# Patient Record
Sex: Male | Born: 1945 | Race: Black or African American | Hispanic: No | Smoking: Former smoker
Health system: Southern US, Community
[De-identification: ages and names within clinical notes are randomized; demographics above are authoritative.]

## PROBLEM LIST (undated history)

## (undated) DIAGNOSIS — I959 Hypotension, unspecified: Secondary | ICD-10-CM

## (undated) DIAGNOSIS — R569 Unspecified convulsions: Secondary | ICD-10-CM

## (undated) DIAGNOSIS — D509 Iron deficiency anemia, unspecified: Secondary | ICD-10-CM

## (undated) DIAGNOSIS — E538 Deficiency of other specified B group vitamins: Secondary | ICD-10-CM

## (undated) DIAGNOSIS — G7080 Lambert-Eaton syndrome, unspecified: Secondary | ICD-10-CM

## (undated) DIAGNOSIS — M51369 Other intervertebral disc degeneration, lumbar region without mention of lumbar back pain or lower extremity pain: Secondary | ICD-10-CM

## (undated) DIAGNOSIS — K219 Gastro-esophageal reflux disease without esophagitis: Secondary | ICD-10-CM

## (undated) DIAGNOSIS — J449 Chronic obstructive pulmonary disease, unspecified: Secondary | ICD-10-CM

## (undated) DIAGNOSIS — M5136 Other intervertebral disc degeneration, lumbar region: Secondary | ICD-10-CM

## (undated) DIAGNOSIS — IMO0001 Reserved for inherently not codable concepts without codable children: Secondary | ICD-10-CM

## (undated) DIAGNOSIS — G8929 Other chronic pain: Secondary | ICD-10-CM

## (undated) DIAGNOSIS — M549 Dorsalgia, unspecified: Secondary | ICD-10-CM

## (undated) HISTORY — PX: OTHER SURGICAL HISTORY: SHX169

## (undated) HISTORY — DX: Iron deficiency anemia, unspecified: D50.9

## (undated) HISTORY — DX: Gastro-esophageal reflux disease without esophagitis: K21.9

## (undated) HISTORY — DX: Hypotension, unspecified: I95.9

## (undated) HISTORY — DX: Lambert-Eaton syndrome, unspecified: G70.80

## (undated) HISTORY — DX: Deficiency of other specified B group vitamins: E53.8

---

## 2001-04-22 ENCOUNTER — Encounter: Payer: Self-pay | Admitting: *Deleted

## 2001-04-22 ENCOUNTER — Emergency Department (HOSPITAL_COMMUNITY): Admission: EM | Admit: 2001-04-22 | Discharge: 2001-04-22 | Payer: Self-pay | Admitting: *Deleted

## 2013-05-11 ENCOUNTER — Encounter (HOSPITAL_COMMUNITY): Payer: Self-pay

## 2013-05-11 ENCOUNTER — Emergency Department (HOSPITAL_COMMUNITY): Payer: Self-pay

## 2013-05-11 ENCOUNTER — Emergency Department (HOSPITAL_COMMUNITY)
Admission: EM | Admit: 2013-05-11 | Discharge: 2013-05-11 | Disposition: A | Discharge status: Home / Self Care | Payer: Self-pay | Attending: Emergency Medicine | Admitting: Emergency Medicine

## 2013-05-11 DIAGNOSIS — M549 Dorsalgia, unspecified: Secondary | ICD-10-CM

## 2013-05-11 DIAGNOSIS — M545 Low back pain, unspecified: Secondary | ICD-10-CM | POA: Insufficient documentation

## 2013-05-11 DIAGNOSIS — Y929 Unspecified place or not applicable: Secondary | ICD-10-CM | POA: Insufficient documentation

## 2013-05-11 DIAGNOSIS — R110 Nausea: Secondary | ICD-10-CM | POA: Insufficient documentation

## 2013-05-11 DIAGNOSIS — IMO0002 Reserved for concepts with insufficient information to code with codable children: Secondary | ICD-10-CM | POA: Insufficient documentation

## 2013-05-11 DIAGNOSIS — R0602 Shortness of breath: Secondary | ICD-10-CM | POA: Insufficient documentation

## 2013-05-11 DIAGNOSIS — T18108A Unspecified foreign body in esophagus causing other injury, initial encounter: Secondary | ICD-10-CM | POA: Insufficient documentation

## 2013-05-11 DIAGNOSIS — Y9389 Activity, other specified: Secondary | ICD-10-CM | POA: Insufficient documentation

## 2013-05-11 DIAGNOSIS — R079 Chest pain, unspecified: Secondary | ICD-10-CM | POA: Insufficient documentation

## 2013-05-11 DIAGNOSIS — Z8669 Personal history of other diseases of the nervous system and sense organs: Secondary | ICD-10-CM | POA: Insufficient documentation

## 2013-05-11 HISTORY — DX: Unspecified convulsions: R56.9

## 2013-05-11 LAB — CBC WITH DIFFERENTIAL/PLATELET
Basophils Absolute: 0.1 10*3/uL (ref 0.0–0.1)
Basophils Relative: 1 % (ref 0–1)
Eosinophils Absolute: 0.2 10*3/uL (ref 0.0–0.7)
Eosinophils Relative: 2 % (ref 0–5)
HCT: 37.6 % — ABNORMAL LOW (ref 39.0–52.0)
Hemoglobin: 12.4 g/dL — ABNORMAL LOW (ref 13.0–17.0)
Lymphocytes Relative: 23 % (ref 12–46)
Lymphs Abs: 1.8 10*3/uL (ref 0.7–4.0)
MCH: 23 pg — ABNORMAL LOW (ref 26.0–34.0)
MCHC: 33 g/dL (ref 30.0–36.0)
MCV: 69.8 fL — ABNORMAL LOW (ref 78.0–100.0)
Monocytes Absolute: 0.5 10*3/uL (ref 0.1–1.0)
Monocytes Relative: 6 % (ref 3–12)
Neutro Abs: 5.2 10*3/uL (ref 1.7–7.7)
Neutrophils Relative %: 68 % (ref 43–77)
Platelets: 219 10*3/uL (ref 150–400)
RBC: 5.39 MIL/uL (ref 4.22–5.81)
RDW: 14.9 % (ref 11.5–15.5)
WBC: 7.8 10*3/uL (ref 4.0–10.5)

## 2013-05-11 LAB — COMPREHENSIVE METABOLIC PANEL
ALT: 9 U/L (ref 0–53)
AST: 18 U/L (ref 0–37)
Albumin: 3.6 g/dL (ref 3.5–5.2)
Alkaline Phosphatase: 63 U/L (ref 39–117)
BUN: 11 mg/dL (ref 6–23)
CO2: 30 mEq/L (ref 19–32)
Calcium: 9.9 mg/dL (ref 8.4–10.5)
Chloride: 102 mEq/L (ref 96–112)
Creatinine, Ser: 1.23 mg/dL (ref 0.50–1.35)
GFR calc Af Amer: 68 mL/min — ABNORMAL LOW (ref >90)
GFR calc non Af Amer: 59 mL/min — ABNORMAL LOW (ref >90)
Glucose, Bld: 97 mg/dL (ref 70–99)
Potassium: 5 mEq/L (ref 3.5–5.1)
Sodium: 139 mEq/L (ref 135–145)
Total Bilirubin: 0.7 mg/dL (ref 0.3–1.2)
Total Protein: 7.5 g/dL (ref 6.0–8.3)

## 2013-05-11 LAB — LIPASE, BLOOD: Lipase: 21 U/L (ref 11–59)

## 2013-05-11 MED ORDER — TRAMADOL HCL 50 MG PO TABS: 50.0000 mg | ORAL_TABLET | Freq: Four times a day (QID) | ORAL | Status: DC | PRN

## 2013-05-11 NOTE — ED Notes (Signed)
Pt states he ate a salad Sunday. States he has been having difficulty swallowing since. States he has been able to drink water and keep it down but has not eaten any solid food because he is afraid to. Also, complain of low back pain.

## 2013-05-11 NOTE — Progress Notes (Signed)
ED/CM noted patient did not have health insurance and/or PCP listed in the computer.  Patient was given the [place] resource handout with information on the clinics, food pantries, and the handout for new health insurance sign-up.  Patient expressed appreciation for this. 

## 2013-05-11 NOTE — ED Provider Notes (Signed)
CSN: [id number]     Arrival date & time 05/11/13  1323 History   This chart was scribed for Erik Jakes, MD, by Erik [name], ED Scribe. This patient was seen in room APA02/APA02 and the patient's care was started at 2:19 PM.  First MD Initiated Contact with Patient 05/11/13 1411     Chief Complaint  Patient presents with  . Dysphagia    The history is provided by the patient and a relative. No language interpreter was used.   HPI Comments: Erik [name] is a 67 y.o. male, with a h/o seizures, who presents to the Emergency Department complaining of persistent dysphagia which has been occurring for five days. The symptoms began after he ate sausage cakes in the morning followed by a salad in the afternoon; the discomfort did not prohibit him from finishing the sausage cakes or salad. The dysphagia is increased with deep inspiration. It was increased with the swallowing of water. However, he treated the dysphagia with Alka-Selzer and that helped him drink water without pain. He reports that he has eaten less since Sunday; the pt reports no difficulty swallowing the water or crackers. He denies any h/o dysphagia prior to this occurrence. He denies any emesis. He also complains of lower back pain which radiates to the back of his legs bilaterally and intermittent SOB which is increased with exertion. The pt visited his PCP earlier this week who advised him to come to the ED. The pt is a non-smoker.   His PCP with with [facility] Department.  Past Medical History  Diagnosis Date  . Seizures    History reviewed. No pertinent past surgical history. No family history on file. History  Substance Use Topics  . Smoking status: Never Smoker   . Smokeless tobacco: Not on file  . Alcohol Use: No    Review of Systems  Constitutional: Negative for fever, chills and appetite change.  HENT: Negative for congestion, rhinorrhea, sneezing, trouble swallowing and neck pain.   Eyes:  Negative for visual disturbance.  Respiratory: Positive for shortness of breath. Negative for cough.   Cardiovascular: Positive for chest pain.  Gastrointestinal: Positive for nausea. Negative for vomiting, abdominal pain and diarrhea.  Genitourinary: Negative for dysuria.  Musculoskeletal: Positive for back pain (Baseline pain to lower, center of the back).  Skin: Negative for rash.  Neurological: Negative for numbness and headaches.  Hematological: Does not bruise/bleed easily.    Allergies  Review of patient's allergies indicates no known allergies.  Home Medications   Current Outpatient Rx  Name  Route  Sig  Dispense  Refill  . Aspirin Effervescent (ALKA-SELTZER PO)   Oral   Take 1 tablet by mouth daily as needed.         . traMADol (ULTRAM) 50 MG tablet   Oral   Take 1 tablet (50 mg total) by mouth every 6 (six) hours as needed.   20 tablet   0    Triage Vitals: BP 155/93  Pulse 78  Temp(Src) 98.8 F (37.1 C) (Oral)  Resp 20  Ht 6\' 3"  (1.905 m)  Wt 190 lb (86.183 kg)  BMI 23.75 kg/m2  SpO2 97%  Physical Exam  Nursing note and vitals reviewed. Constitutional: He is oriented to person, place, and time. He appears well-developed and well-nourished. No distress.  HENT:  Head: Normocephalic and atraumatic.  Mouth/Throat: Oropharynx is clear and moist.  No erythema to pharynx. No swelling to pharynx.   Eyes: EOM are normal.  Neck: Neck supple. No tracheal deviation present.  Cardiovascular: Normal rate, regular rhythm, normal heart sounds and intact distal pulses.   No murmur heard. 2 + right DP  2 + left DP Cap refill less than 1 second.   Pulmonary/Chest: Effort normal and breath sounds normal. No respiratory distress. He has no wheezes.  Abdominal: Soft. Bowel sounds are normal. There is no tenderness.  Musculoskeletal: Normal range of motion. He exhibits tenderness. He exhibits no edema.  Tenderness to right lumbar spinal process area. No lumbar  peri-process tenderness.   Neurological: He is alert and oriented to person, place, and time.  Skin: Skin is warm and dry.  Psychiatric: He has a normal mood and affect. His behavior is normal.    ED Course  Procedures (including critical care time)  DIAGNOSTIC STUDIES: Oxygen Saturation is 97% on room air, normal by my interpretation.    COORDINATION OF CARE:  2:51 PM- Discussed treatment plan with patient, and the patient agreed to the plan.   Labs Review Labs Reviewed  COMPREHENSIVE METABOLIC PANEL - Abnormal; Notable for the following:    GFR calc non Af Amer 59 (*)    GFR calc Af Amer 68 (*)    All other components within normal limits  CBC WITH DIFFERENTIAL - Abnormal; Notable for the following:    Hemoglobin 12.4 (*)    HCT 37.6 (*)    MCV 69.8 (*)    MCH 23.0 (*)    All other components within normal limits  LIPASE, BLOOD   Imaging Review Dg Chest 2 View  05/11/2013   CLINICAL DATA:  Low back pain and difficulty swallowing. No injury. Dysphagia.  EXAM: CHEST  2 VIEW  COMPARISON:  None.  FINDINGS: The heart size and mediastinal contours are within normal limits. The lungs are hyperexpanded but clear. The visualized skeletal structures are unremarkable.  IMPRESSION: No active cardiopulmonary disease.   Electronically Signed   By: Amie Portland   On: 05/11/2013 16:02   Dg Lumbar Spine Complete  05/11/2013   CLINICAL DATA:  Low back pain. No injury.  EXAM: LUMBAR SPINE - COMPLETE 4+ VIEW  COMPARISON:  None.  FINDINGS: No fracture. There is a minor, grade 1, anterolisthesis of L4 on L5. No other spondylolisthesis. Mild loss of disk height is noted at L3-L4 with moderate loss of disk height at L4-L5. Remaining disk spaces are well preserved. There is facet joint narrowing on the left from L3-L4 through L5-S1 and on the right at L4-L5 and L5-S1.  There is a calcification in the left upper quadrant, which is likely vascular. It could reside within the stomach. The soft tissues are  otherwise unremarkable.  IMPRESSION: Degenerative changes as described.   Electronically Signed   By: Amie Portland   On: 05/11/2013 16:04   Results for orders placed during the hospital encounter of 05/11/13  COMPREHENSIVE METABOLIC PANEL      Result Value Range   Sodium 139  135 - 145 mEq/L   Potassium 5.0  3.5 - 5.1 mEq/L   Chloride 102  96 - 112 mEq/L   CO2 30  19 - 32 mEq/L   Glucose, Bld 97  70 - 99 mg/dL   BUN 11  6 - 23 mg/dL   Creatinine, Ser 1.61  0.50 - 1.35 mg/dL   Calcium 9.9  8.4 - 09.6 mg/dL   Total Protein 7.5  6.0 - 8.3 g/dL   Albumin 3.6  3.5 - 5.2 g/dL   AST  18  0 - 37 U/L   ALT 9  0 - 53 U/L   Alkaline Phosphatase 63  39 - 117 U/L   Total Bilirubin 0.7  0.3 - 1.2 mg/dL   GFR calc non Af Amer 59 (*) >90 mL/min   GFR calc Af Amer 68 (*) >90 mL/min  LIPASE, BLOOD      Result Value Range   Lipase 21  11 - 59 U/L  CBC WITH DIFFERENTIAL      Result Value Range   WBC 7.8  4.0 - 10.5 K/uL   RBC 5.39  4.22 - 5.81 MIL/uL   Hemoglobin 12.4 (*) 13.0 - 17.0 g/dL   HCT 16.1 (*) 09.6 - 04.5 %   MCV 69.8 (*) 78.0 - 100.0 fL   MCH 23.0 (*) 26.0 - 34.0 pg   MCHC 33.0  30.0 - 36.0 g/dL   RDW 40.9  81.1 - 91.4 %   Platelets 219  150 - 400 K/uL   Neutrophils Relative % 68  43 - 77 %   Lymphocytes Relative 23  12 - 46 %   Monocytes Relative 6  3 - 12 %   Eosinophils Relative 2  0 - 5 %   Basophils Relative 1  0 - 1 %   Neutro Abs 5.2  1.7 - 7.7 K/uL   Lymphs Abs 1.8  0.7 - 4.0 K/uL   Monocytes Absolute 0.5  0.1 - 1.0 K/uL   Eosinophils Absolute 0.2  0.0 - 0.7 K/uL   Basophils Absolute 0.1  0.0 - 0.1 K/uL   WBC Morphology ATYPICAL LYMPHOCYTES     Smear Review LARGE PLATELETS PRESENT      Date: 05/11/2013  Rate: 73  Rhythm: normal sinus rhythm  QRS Axis: normal  Intervals: normal  ST/T Wave abnormalities: normal  Conduction Disutrbances:none  Narrative Interpretation:   Old EKG Reviewed: none available    MDM   1. Back pain   2. Esophageal foreign body,  initial encounter    Patient with foreign body sensation in the esophagus, he has been able to drink water fine and ate some crackers he was concerned and afraid to eat solid food no evidence of esophageal impaction here patient is able to be solid food without any problems. Patient's lumbar back has a lot of degenerative changes we'll refer him to the spine clinic for further followup and workup of that. That would explain his back pain. Patient currently has no neurological deficits. Patient's chest x-ray is negative for pneumonia pulmonary edema pneumothorax or pneumomediastinum.  EKG without any evidence of acute cardiac event. Patient's symptoms really not consistent with cardiac chest pain.   I personally performed the services described in this documentation, which was scribed in my presence. The recorded information has been reviewed and is accurate.      Erik Jakes, MD 05/11/13 [phone]

## 2014-06-25 ENCOUNTER — Encounter (HOSPITAL_COMMUNITY): Payer: Self-pay | Admitting: *Deleted

## 2014-06-25 ENCOUNTER — Emergency Department (HOSPITAL_COMMUNITY)
Admission: EM | Admit: 2014-06-25 | Discharge: 2014-06-25 | Disposition: A | Discharge status: Home / Self Care | Payer: Medicare Other | Attending: Emergency Medicine | Admitting: Emergency Medicine

## 2014-06-25 ENCOUNTER — Emergency Department (HOSPITAL_COMMUNITY): Payer: Medicare Other

## 2014-06-25 DIAGNOSIS — R0602 Shortness of breath: Secondary | ICD-10-CM | POA: Diagnosis not present

## 2014-06-25 DIAGNOSIS — M5440 Lumbago with sciatica, unspecified side: Secondary | ICD-10-CM | POA: Insufficient documentation

## 2014-06-25 DIAGNOSIS — M549 Dorsalgia, unspecified: Secondary | ICD-10-CM | POA: Diagnosis not present

## 2014-06-25 DIAGNOSIS — R5383 Other fatigue: Secondary | ICD-10-CM | POA: Insufficient documentation

## 2014-06-25 DIAGNOSIS — Z7982 Long term (current) use of aspirin: Secondary | ICD-10-CM | POA: Insufficient documentation

## 2014-06-25 LAB — PRO B NATRIURETIC PEPTIDE: PRO B NATRI PEPTIDE: 27.9 pg/mL (ref 0–125)

## 2014-06-25 LAB — BASIC METABOLIC PANEL
ANION GAP: 16 — AB (ref 5–15)
BUN: 18 mg/dL (ref 6–23)
CHLORIDE: 101 meq/L (ref 96–112)
CO2: 24 meq/L (ref 19–32)
Calcium: 9.6 mg/dL (ref 8.4–10.5)
Creatinine, Ser: 1.41 mg/dL — ABNORMAL HIGH (ref 0.50–1.35)
GFR calc non Af Amer: 49 mL/min — ABNORMAL LOW (ref >90)
GFR, EST AFRICAN AMERICAN: 57 mL/min — AB (ref >90)
Glucose, Bld: 88 mg/dL (ref 70–99)
POTASSIUM: 4.4 meq/L (ref 3.7–5.3)
SODIUM: 141 meq/L (ref 137–147)

## 2014-06-25 LAB — CBC
HCT: 38.8 % — ABNORMAL LOW (ref 39.0–52.0)
Hemoglobin: 12.7 g/dL — ABNORMAL LOW (ref 13.0–17.0)
MCH: 22.4 pg — ABNORMAL LOW (ref 26.0–34.0)
MCHC: 32.7 g/dL (ref 30.0–36.0)
MCV: 68.3 fL — ABNORMAL LOW (ref 78.0–100.0)
PLATELETS: 261 10*3/uL (ref 150–400)
RBC: 5.68 MIL/uL (ref 4.22–5.81)
RDW: 15.1 % (ref 11.5–15.5)
WBC: 8.8 10*3/uL (ref 4.0–10.5)

## 2014-06-25 LAB — I-STAT TROPONIN, ED
TROPONIN I, POC: 0 ng/mL (ref 0.00–0.08)
Troponin i, poc: 0 ng/mL (ref 0.00–0.08)

## 2014-06-25 MED ORDER — SODIUM CHLORIDE 0.9 % IV BOLUS (SEPSIS)
1000.0000 mL | Freq: Once | INTRAVENOUS | Status: AC
  Administered 2014-06-25: 1000 mL via INTRAVENOUS

## 2014-06-25 MED ORDER — TRAMADOL HCL 50 MG PO TABS: 50.0000 mg | ORAL_TABLET | Freq: Four times a day (QID) | ORAL | Status: DC | PRN

## 2014-06-25 NOTE — ED Notes (Signed)
Pt reports sob, weakness, backpain(chronic) and dizziness for the past couple days.  Pt reports dizziness upon standing. Pt lung sounds diminished.  Pt denies any pain or chest pain.

## 2014-06-25 NOTE — ED Provider Notes (Signed)
CSN: [id number]     Arrival date & time 06/25/14  1312 History   First MD Initiated Contact with Patient 06/25/14 1732     Chief Complaint  Patient presents with  . Shortness of Breath  . Fatigue     (Consider location/radiation/quality/duration/timing/severity/associated sxs/prior Treatment) HPI Comments: Patient presents to the emergency department with chief complaint of shortness breath and dizziness for the past 2 days. He states that he gets lightheaded when he stands up. He also reports exertional shortness of breath. He denies any chest pain. He has not taken anything to alleviate his symptoms. He denies any fevers, chills, cough, nausea, or vomiting.  He has no cardiac or PE history. No recent travel or PE risk factors.  Patient also complains of chronic low back pain. He states that the pain radiates to his lower extremities. He denies any weakness or numbness. He denies any bowel or bladder incontinence.  The history is provided by the patient. No language interpreter was used.    Past Medical History  Diagnosis Date  . Seizures    History reviewed. No pertinent past surgical history. History reviewed. No pertinent family history. History  Substance Use Topics  . Smoking status: Never Smoker   . Smokeless tobacco: Not on file  . Alcohol Use: No    Review of Systems  Constitutional: Negative for fever and chills.  Respiratory: Positive for shortness of breath.   Cardiovascular: Negative for chest pain.  Gastrointestinal: Negative for nausea, vomiting, diarrhea and constipation.       No bowel incontinence  Genitourinary: Negative for dysuria.       No urinary incontinence  Musculoskeletal: Positive for myalgias, back pain and arthralgias.  Neurological:       No saddle anesthesia  All other systems reviewed and are negative.     Allergies  Review of patient's allergies indicates no known allergies.  Home Medications   Prior to Admission medications    Medication Sig Start Date End Date Taking? Authorizing Provider  aspirin EC 81 MG tablet Take 81 mg by mouth daily.   Yes Historical Provider, MD  traMADol (ULTRAM) 50 MG tablet Take 1 tablet (50 mg total) by mouth every 6 (six) hours as needed. 05/11/13   Fredia Sorrow, MD   BP 116/74 mmHg  Pulse 82  Temp(Src) 97.6 F (36.4 C) (Oral)  Resp 19  SpO2 100% Physical Exam  Constitutional: He is oriented to person, place, and time. He appears well-developed and well-nourished. No distress.  HENT:  Head: Normocephalic and atraumatic.  Eyes: Conjunctivae and EOM are normal. Right eye exhibits no discharge. Left eye exhibits no discharge. No scleral icterus.  Neck: Normal range of motion. Neck supple. No tracheal deviation present.  Cardiovascular: Normal rate, regular rhythm and normal heart sounds.  Exam reveals no gallop and no friction rub.   No murmur heard. Pulmonary/Chest: Effort normal and breath sounds normal. No respiratory distress. He has no wheezes.  Clear to auscultation bilaterally  Abdominal: Soft. He exhibits no distension. There is no tenderness.  Musculoskeletal: Normal range of motion.  Lumbar paraspinal muscles tender to palpation, no bony tenderness, step-offs, or gross abnormality or deformity of spine, patient is able to ambulate, moves all extremities  Bilateral great toe extension intact Bilateral plantar/dorsiflexion intact  Neurological: He is alert and oriented to person, place, and time. He has normal reflexes.  Sensation and strength intact bilaterally Symmetrical reflexes  Skin: Skin is warm. He is not diaphoretic.  Psychiatric: He has  a normal mood and affect. His behavior is normal. Judgment and thought content normal.  Nursing note and vitals reviewed.   ED Course  Procedures (including critical care time) Labs Review Labs Reviewed  CBC - Abnormal; Notable for the following:    Hemoglobin 12.7 (*)    HCT 38.8 (*)    MCV 68.3 (*)    MCH 22.4 (*)     All other components within normal limits  BASIC METABOLIC PANEL - Abnormal; Notable for the following:    Creatinine, Ser 1.41 (*)    GFR calc non Af Amer 49 (*)    GFR calc Af Amer 57 (*)    Anion gap 16 (*)    All other components within normal limits  PRO B NATRIURETIC PEPTIDE  I-STAT TROPOININ, ED  I-STAT TROPOININ, ED    Imaging Review Dg Chest 2 View  06/25/2014   CLINICAL DATA:  Back pain and shortness of breath for 2 days  EXAM: CHEST  2 VIEW  COMPARISON:  05/11/2013.  FINDINGS: The heart size and mediastinal contours are within normal limits. Both lungs are clear. The visualized skeletal structures are unremarkable.  IMPRESSION: No active cardiopulmonary disease.   Electronically Signed   By: Kerby Moors M.D.   On: 06/25/2014 13:56     EKG Interpretation   Date/Time:  Monday June 25 2014 13:22:01 EST Ventricular Rate:  94 PR Interval:  140 QRS Duration: 92 QT Interval:  346 QTC Calculation: 432 R Axis:   -58 Text Interpretation:  Normal sinus rhythm Incomplete right bundle branch  block Left anterior fascicular block Septal infarct , age undetermined  Abnormal ECG Confirmed by ZAVITZ  MD, JOSHUA ([id number]) on 06/25/2014 6:25:28  PM      MDM   Final diagnoses:  SOB (shortness of breath)   Patient with shortness of breath times a couple days. He does have some exertional symptoms, but denies any chest pain. He is not actively short of breath. Patient also has back pain, which sounds like sciatica versus lumbar radiculopathy.  Patient seen by and discussed with Dr. Reather Converse. Patient is low risk for DVT/PE.  Wells score is 0.  Delta troponin is negative.  No EKG changes.   Recommend treatment for low back pain and PCP/spine follow-up.  Patient understands and agrees with the plan.  He is stable and ready for discharge.  Patient with back pain.  No neurological deficits and normal neuro exam.  Patient is ambulatory.  No loss of bowel or bladder control.  Doubt  cauda equina.  Denies fever,  doubt epidural abscess or other lesion. Recommend back exercises, stretching, RICE, and will treat with a short course of norco.  Encouraged the patient that there could be a need for additional workup and/or imaging such as MRI, if the symptoms do not resolve. Patient advised that if the back pain does not resolve, or radiates, this could progress to more serious conditions and is encouraged to follow-up with PCP or orthopedics within 2 weeks.       Montine Circle, PA-C 06/25/14 1954  Mariea Clonts, MD 06/26/14 Laureen Abrahams

## 2014-06-25 NOTE — Discharge Instructions (Signed)
Shortness of Breath Shortness of breath means you have trouble breathing. It could also mean that you have a medical problem. You should get immediate medical care for shortness of breath. CAUSES   Not enough oxygen in the air such as with high altitudes or a smoke-filled room.  Certain lung diseases, infections, or problems.  Heart disease or conditions, such as angina or heart failure.  Low red blood cells (anemia).  Poor physical fitness, which can cause shortness of breath when you exercise.  Chest or back injuries or stiffness.  Being overweight.  Smoking.  Anxiety, which can make you feel like you are not getting enough air. DIAGNOSIS  Serious medical problems can often be found during your physical exam. Tests may also be done to determine why you are having shortness of breath. Tests may include:  Chest X-rays.  Lung function tests.  Blood tests.  An electrocardiogram (ECG).  An ambulatory electrocardiogram. An ambulatory ECG records your heartbeat patterns over a 24-hour period.  Exercise testing.  A transthoracic echocardiogram (TTE). During echocardiography, sound waves are used to evaluate how blood flows through your heart.  A transesophageal echocardiogram (TEE).  Imaging scans. Your health care provider may not be able to find a cause for your shortness of breath after your exam. In this case, it is important to have a follow-up exam with your health care provider as directed.  TREATMENT  Treatment for shortness of breath depends on the cause of your symptoms and can vary greatly. HOME CARE INSTRUCTIONS   Do not smoke. Smoking is a common cause of shortness of breath. If you smoke, ask for help to quit.  Avoid being around chemicals or things that may bother your breathing, such as paint fumes and dust.  Rest as needed. Slowly resume your usual activities.  If medicines were prescribed, take them as directed for the full length of time directed. This  includes oxygen and any inhaled medicines.  Keep all follow-up appointments as directed by your health care provider. SEEK MEDICAL CARE IF:   Your condition does not improve in the time expected.  You have a hard time doing your normal activities even with rest.  You have any new symptoms. SEEK IMMEDIATE MEDICAL CARE IF:   Your shortness of breath gets worse.  You feel light-headed, faint, or develop a cough not controlled with medicines.  You start coughing up blood.  You have pain with breathing.  You have chest pain or pain in your arms, shoulders, or abdomen.  You have a fever.  You are unable to walk up stairs or exercise the way you normally do. MAKE SURE YOU:  Understand these instructions.  Will watch your condition.  Will get help right away if you are not doing well or get worse. Document Released: 04/21/2001 Document Revised: 08/01/2013 Document Reviewed: 10/12/2011 [facility] Patient Information 2015 [place], [place]. This information is not intended to replace advice given to you by your health care provider. Make sure you discuss any questions you have with your health care provider.   Back Pain, Adult Back pain is very common. The pain often gets better over time. The cause of back pain is usually not dangerous. Most people can learn to manage their back pain on their own.  HOME CARE   Stay active. Start with short walks on flat ground if you can. Try to walk farther each day.  Do not sit, drive, or stand in one place for more than 30 minutes. Do not  stay in bed.  Do not avoid exercise or work. Activity can help your back heal faster.  Be careful when you bend or lift an object. Bend at your knees, keep the object close to you, and do not twist.  Sleep on a firm mattress. Lie on your side, and bend your knees. If you lie on your back, put a pillow under your knees.  Only take medicines as told by your doctor.  Put ice on the injured area.  Put ice in  a plastic bag.  Place a towel between your skin and the bag.  Leave the ice on for 15-20 minutes, 03-04 times a day for the first 2 to 3 days. After that, you can switch between ice and heat packs.  Ask your doctor about back exercises or massage.  Avoid feeling anxious or stressed. Find good ways to deal with stress, such as exercise. GET HELP RIGHT AWAY IF:   Your pain does not go away with rest or medicine.  Your pain does not go away in 1 week.  You have new problems.  You do not feel well.  The pain spreads into your legs.  You cannot control when you poop (bowel movement) or pee (urinate).  Your arms or legs feel weak or lose feeling (numbness).  You feel sick to your stomach (nauseous) or throw up (vomit).  You have belly (abdominal) pain.  You feel like you may pass out (faint). MAKE SURE YOU:   Understand these instructions.  Will watch your condition.  Will get help right away if you are not doing well or get worse. Document Released: 01/13/2008 Document Revised: 10/19/2011 Document Reviewed: 11/28/2013 [facility] Patient Information 2015 [place], [place]. This information is not intended to replace advice given to you by your health care provider. Make sure you discuss any questions you have with your health care provider.

## 2014-06-25 NOTE — ED Notes (Signed)
Pt made aware to return if symptoms worsen or if any life threatening symptoms occur.   

## 2014-06-25 NOTE — ED Notes (Signed)
Pt reports having generalized fatigue and sob, dizziness when ambulating. Denies any chest pain. ekg done at triage, airway intact and speaking in full sentences.

## 2014-06-28 ENCOUNTER — Encounter: Payer: Medicare Other | Admitting: Cardiovascular Disease

## 2014-07-04 ENCOUNTER — Emergency Department (HOSPITAL_COMMUNITY)
Admission: EM | Admit: 2014-07-04 | Discharge: 2014-07-04 | Disposition: A | Discharge status: Home / Self Care | Payer: Medicare Other | Attending: Emergency Medicine | Admitting: Emergency Medicine

## 2014-07-04 ENCOUNTER — Emergency Department (HOSPITAL_COMMUNITY): Payer: Medicare Other

## 2014-07-04 ENCOUNTER — Encounter (HOSPITAL_COMMUNITY): Payer: Self-pay | Admitting: Emergency Medicine

## 2014-07-04 DIAGNOSIS — M545 Low back pain, unspecified: Secondary | ICD-10-CM

## 2014-07-04 DIAGNOSIS — R0602 Shortness of breath: Secondary | ICD-10-CM

## 2014-07-04 DIAGNOSIS — M549 Dorsalgia, unspecified: Secondary | ICD-10-CM

## 2014-07-04 DIAGNOSIS — M47816 Spondylosis without myelopathy or radiculopathy, lumbar region: Secondary | ICD-10-CM | POA: Diagnosis not present

## 2014-07-04 DIAGNOSIS — Z79899 Other long term (current) drug therapy: Secondary | ICD-10-CM | POA: Insufficient documentation

## 2014-07-04 DIAGNOSIS — Z7982 Long term (current) use of aspirin: Secondary | ICD-10-CM | POA: Diagnosis not present

## 2014-07-04 DIAGNOSIS — J439 Emphysema, unspecified: Secondary | ICD-10-CM | POA: Diagnosis not present

## 2014-07-04 DIAGNOSIS — R0609 Other forms of dyspnea: Secondary | ICD-10-CM

## 2014-07-04 LAB — BASIC METABOLIC PANEL
Anion gap: 14 (ref 5–15)
BUN: 15 mg/dL (ref 6–23)
CO2: 27 mEq/L (ref 19–32)
CREATININE: 1.4 mg/dL — AB (ref 0.50–1.35)
Calcium: 9.7 mg/dL (ref 8.4–10.5)
Chloride: 95 mEq/L — ABNORMAL LOW (ref 96–112)
GFR, EST AFRICAN AMERICAN: 58 mL/min — AB (ref >90)
GFR, EST NON AFRICAN AMERICAN: 50 mL/min — AB (ref >90)
Glucose, Bld: 104 mg/dL — ABNORMAL HIGH (ref 70–99)
POTASSIUM: 4.6 meq/L (ref 3.7–5.3)
Sodium: 136 mEq/L — ABNORMAL LOW (ref 137–147)

## 2014-07-04 LAB — CBC WITH DIFFERENTIAL/PLATELET
Basophils Absolute: 0 10*3/uL (ref 0.0–0.1)
Basophils Relative: 0 % (ref 0–1)
Eosinophils Absolute: 0 10*3/uL (ref 0.0–0.7)
Eosinophils Relative: 0 % (ref 0–5)
HEMATOCRIT: 37.4 % — AB (ref 39.0–52.0)
Hemoglobin: 12 g/dL — ABNORMAL LOW (ref 13.0–17.0)
LYMPHS ABS: 1.1 10*3/uL (ref 0.7–4.0)
Lymphocytes Relative: 10 % — ABNORMAL LOW (ref 12–46)
MCH: 22.2 pg — ABNORMAL LOW (ref 26.0–34.0)
MCHC: 32.1 g/dL (ref 30.0–36.0)
MCV: 69.1 fL — ABNORMAL LOW (ref 78.0–100.0)
MONO ABS: 0.6 10*3/uL (ref 0.1–1.0)
MONOS PCT: 5 % (ref 3–12)
NEUTROS ABS: 9.5 10*3/uL — AB (ref 1.7–7.7)
Neutrophils Relative %: 84 % — ABNORMAL HIGH (ref 43–77)
Platelets: 236 10*3/uL (ref 150–400)
RBC: 5.41 MIL/uL (ref 4.22–5.81)
RDW: 15 % (ref 11.5–15.5)
WBC: 11.3 10*3/uL — ABNORMAL HIGH (ref 4.0–10.5)

## 2014-07-04 LAB — I-STAT TROPONIN, ED: TROPONIN I, POC: 0 ng/mL (ref 0.00–0.08)

## 2014-07-04 LAB — PRO B NATRIURETIC PEPTIDE: Pro B Natriuretic peptide (BNP): 36.7 pg/mL (ref 0–125)

## 2014-07-04 LAB — D-DIMER, QUANTITATIVE: D-Dimer, Quant: 2.17 ug/mL-FEU — ABNORMAL HIGH (ref 0.00–0.48)

## 2014-07-04 MED ORDER — IOHEXOL 350 MG/ML SOLN
80.0000 mL | Freq: Once | INTRAVENOUS | Status: AC | PRN
  Administered 2014-07-04: 64 mL via INTRAVENOUS

## 2014-07-04 MED ORDER — HYDROCODONE-ACETAMINOPHEN 5-325 MG PO TABS: 1.0000 | ORAL_TABLET | Freq: Four times a day (QID) | ORAL | Status: DC | PRN

## 2014-07-04 NOTE — ED Notes (Signed)
Pt c/o lower back pain and SOB x 1 week; pt seen here for same 5 days ago per pt

## 2014-07-04 NOTE — ED Provider Notes (Signed)
CSN: [id number]     Arrival date & time 07/04/14  1452 History   First MD Initiated Contact with Patient 07/04/14 1616     Chief Complaint  Patient presents with  . Shortness of Breath  . Back Pain     Patient is a 68 y.o. male presenting with shortness of breath and back pain. The history is provided by the patient (grandson).  Shortness of Breath Back Pain  Erik [name] presents for evaluation evaluation of shortness of breath and dyspnea on exertion. He reports that he's been short of breath for the last 2 weeks this is predominantly when he exerts himself. He has trouble with ambulating even short distances before he becomes short of breath. He denies fevers, cough, chest pain, leg edema, leg pain. He reports he has no appetite and is unable to eat. He denies nausea, vomiting, abdominal pain. He also reports that he has low back pain that has been present for years but much worse over the last few weeks. He has no new injuries, no numbness, no weakness. Symptoms are mild, gradual, and worsening.   Past Medical History  Diagnosis Date  . Seizures    History reviewed. No pertinent past surgical history. History reviewed. No pertinent family history. History  Substance Use Topics  . Smoking status: Never Smoker   . Smokeless tobacco: Not on file  . Alcohol Use: No    Review of Systems  Respiratory: Positive for shortness of breath.   Musculoskeletal: Positive for back pain.  All other systems reviewed and are negative.     Allergies  Review of patient's allergies indicates no known allergies.  Home Medications   Prior to Admission medications   Medication Sig Start Date End Date Taking? Authorizing Provider  aspirin EC 81 MG tablet Take 81 mg by mouth daily.    Historical Provider, MD  traMADol (ULTRAM) 50 MG tablet Take 1 tablet (50 mg total) by mouth every 6 (six) hours as needed. 06/25/14   Montine Circle, PA-C   BP 139/76 mmHg  Pulse 90  Temp(Src) 97.5 F (36.4  C) (Oral)  Resp 17  Ht 6\' 3"  (1.905 m)  Wt 190 lb (86.183 kg)  BMI 23.75 kg/m2  SpO2 100% Physical Exam  Constitutional: He is oriented to person, place, and time. He appears well-developed and well-nourished.  HENT:  Head: Normocephalic and atraumatic.  Cardiovascular: Normal rate.   No murmur heard. Pulmonary/Chest: Effort normal. No respiratory distress.  Abdominal: Soft. There is no tenderness. There is no rebound and no guarding.  Musculoskeletal: He exhibits no edema or tenderness.  Neurological: He is alert and oriented to person, place, and time.  5/5 strength in BLE.  Sensation to light touch intact in all four extremities.  No bony tenderness over lumbar spine.   Skin: Skin is warm and dry.  Psychiatric: He has a normal mood and affect. His behavior is normal.  Nursing note and vitals reviewed.   ED Course  Procedures (including critical care time) Labs Review Labs Reviewed  BASIC METABOLIC PANEL - Abnormal; Notable for the following:    Sodium 136 (*)    Chloride 95 (*)    Glucose, Bld 104 (*)    Creatinine, Ser 1.40 (*)    GFR calc non Af Amer 50 (*)    GFR calc Af Amer 58 (*)    All other components within normal limits  CBC WITH DIFFERENTIAL - Abnormal; Notable for the following:    WBC 11.3 (*)  Hemoglobin 12.0 (*)    HCT 37.4 (*)    MCV 69.1 (*)    MCH 22.2 (*)    Neutrophils Relative % 84 (*)    Neutro Abs 9.5 (*)    Lymphocytes Relative 10 (*)    All other components within normal limits  D-DIMER, QUANTITATIVE - Abnormal; Notable for the following:    D-Dimer, Quant 2.17 (*)    All other components within normal limits  PRO B NATRIURETIC PEPTIDE  I-STAT TROPOININ, ED    Imaging Review Dg Chest 2 View  07/04/2014   CLINICAL DATA:  Shortness of breath.  EXAM: CHEST  2 VIEW  COMPARISON:  June 25, 2014.  FINDINGS: The heart size and mediastinal contours are within normal limits. Both lungs are clear. No pneumothorax or pleural effusion is  noted. The visualized skeletal structures are unremarkable.  IMPRESSION: No acute cardiopulmonary abnormality seen.   Electronically Signed   By: Sabino Dick M.D.   On: 07/04/2014 16:10     EKG Interpretation None      MDM   Final diagnoses:  Back pain  Bilateral low back pain without sciatica  Dyspnea on exertion    Patient here for evaluation of back pain as well as dyspnea. In terms of dyspnea clinical picture not consistent with acute CHF, ACS, PE. Question some elements of deconditioning as well as possible COPD. Patient without wheezing in department the patient able to ambulate without hypoxia. Recommend outpatient follow-up for further evaluation of dyspnea on exertion. Intensive low back pain clinical picture not consistent with AAA, renal colic, urinary tract infection, cauda equina. 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    Quintella Reichert, MD 07/04/14 [phone]

## 2014-07-04 NOTE — Discharge Instructions (Signed)
Your CT scan shows signs of emphysema, which may be making you short of breath.  Follow up with your family doctor for more evaluation and treatment.     Back Pain, Adult Low back pain is very common. About 1 in 5 people have back pain.The cause of low back pain is rarely dangerous. The pain often gets better over time.About half of people with a sudden onset of back pain feel better in just 2 weeks. About 8 in 10 people feel better by 6 weeks.  CAUSES Some common causes of back pain include:  Strain of the muscles or ligaments supporting the spine.  Wear and tear (degeneration) of the spinal discs.  Arthritis.  Direct injury to the back. DIAGNOSIS Most of the time, the direct cause of low back pain is not known.However, back pain can be treated effectively even when the exact cause of the pain is unknown.Answering your caregiver's questions about your overall health and symptoms is one of the most accurate ways to make sure the cause of your pain is not dangerous. If your caregiver needs more information, he or she may order lab work or imaging tests (X-rays or MRIs).However, even if imaging tests show changes in your back, this usually does not require surgery. HOME CARE INSTRUCTIONS For many people, back pain returns.Since low back pain is rarely dangerous, it is often a condition that people can learn to [facility] their own.   Remain active. It is stressful on the back to sit or stand in one place. Do not sit, drive, or stand in one place for more than 30 minutes at a time. Take short walks on level surfaces as soon as pain allows.Try to increase the length of time you walk each day.  Do not stay in bed.Resting more than 1 or 2 days can delay your recovery.  Do not avoid exercise or work.Your body is made to move.It is not dangerous to be active, even though your back may hurt.Your back will likely heal faster if you return to being active before your pain is gone.  Pay  attention to your body when you bend and lift. Many people have less discomfortwhen lifting if they bend their knees, keep the load close to their bodies,and avoid twisting. Often, the most comfortable positions are those that put less stress on your recovering back.  Find a comfortable position to sleep. Use a firm mattress and lie on your side with your knees slightly bent. If you lie on your back, put a pillow under your knees.  Only take over-the-counter or prescription medicines as directed by your caregiver. Over-the-counter medicines to reduce pain and inflammation are often the most helpful.Your caregiver may prescribe muscle relaxant drugs.These medicines help dull your pain so you can more quickly return to your normal activities and healthy exercise.  Put ice on the injured area.  Put ice in a plastic bag.  Place a towel between your skin and the bag.  Leave the ice on for 15-20 minutes, 03-04 times a day for the first 2 to 3 days. After that, ice and heat may be alternated to reduce pain and spasms.  Ask your caregiver about trying back exercises and gentle massage. This may be of some benefit.  Avoid feeling anxious or stressed.Stress increases muscle tension and can worsen back pain.It is important to recognize when you are anxious or stressed and learn ways to manage it.Exercise is a great option. SEEK MEDICAL CARE IF:  You have pain that  is not relieved with rest or medicine.  You have pain that does not improve in 1 week.  You have new symptoms.  You are generally not feeling well. SEEK IMMEDIATE MEDICAL CARE IF:   You have pain that radiates from your back into your legs.  You develop new bowel or bladder control problems.  You have unusual weakness or numbness in your arms or legs.  You develop nausea or vomiting.  You develop abdominal pain.  You feel faint. Document Released: 07/27/2005 Document Revised: 01/26/2012 Document Reviewed:  11/28/2013 [facility] Patient Information 2015 [place], [place]. This information is not intended to replace advice given to you by your health care provider. Make sure you discuss any questions you have with your health care provider.   Emergency Department Resource Guide 1) Find a Doctor and Pay Out of Pocket Although you won't have to find out who is covered by your insurance plan, it is a good idea to ask around and get recommendations. You will then need to call the office and see if the doctor you have chosen will accept you as a new patient and what types of options they offer for patients who are self-pay. Some doctors offer discounts or will set up payment plans for their patients who do not have insurance, but you will need to ask so you aren't surprised when you get to your appointment.  2) Contact Your Local Health Department Not all health departments have doctors that can see patients for sick visits, but many do, so it is worth a call to see if yours does. If you don't know where your local health department is, you can check in your phone book. The CDC also has a tool to help you locate your state's health department, and many state websites also have listings of all of their local health departments.  3) Find a [facility] If your illness is not likely to be very severe or complicated, you may want to try a walk in clinic. These are popping up all over the country in pharmacies, drugstores, and shopping centers. They're usually staffed by nurse practitioners or physician assistants that have been trained to treat common illnesses and complaints. They're usually fairly quick and inexpensive. However, if you have serious medical issues or chronic medical problems, these are probably not your best option.  No Primary Care Doctor: - Call Health Connect at  [phone] - they can help you locate a primary care doctor that  accepts your insurance, provides certain services, etc. - Physician  Referral Service- [phone]  Chronic Pain Problems: Organization         Address  Phone   Notes  [facility]  [phone] Patients need to be referred by their primary care doctor.   Medication Assistance: Organization         Address  Phone   Notes  [facility] Medication [facility] [address] [place], [place] [address] [phone] --Must be a resident of [facility] -- Must have NO insurance coverage whatsoever (no Medicaid/ Medicare, etc.) -- The pt. MUST have a primary care doctor that directs their care regularly and follows them in the community   MedAssist  [phone]   [employer]  [phone]    Agencies that provide inexpensive medical care: Organization         Address  Phone   Notes  [place]  [phone]   [facility]    [phone]   [facility] [place], [place] [address] [phone]   [place] [address] [place] [phone]   Planned Parenthood    [phone]   [facility]    [phone]   [place] and [address], [place] Phone:  [phone], Fax:  [phone] Hours of Operation:  9 am - 6 pm, M-F.  Also accepts Medicaid/Medicare and self-pay.  [facility] for [place] [address] [place], [address], [place] Phone: [phone], Fax: [phone]. Hours of Operation:  8:30 am - 5:30 pm, M-F.  Also accepts Medicaid and self-pay.  [facility] [place] [address], [place] Phone: [phone]   [place], [place], [place] [phone], Ext. [phone] Mondays & Thursdays: 7-9 AM.  First 15 patients are seen on a first come, first serve basis.    [place] Providers:  Organization         Address  Phone   Notes  [facility] [address], [place] [phone] Also accepts self-pay patients.  [facility] [address], [place]  [phone]   [place], [address], [place] [phone]   [facility] [address] [place] [phone]   Lucianne Lei [address], [place]   [phone] Only accepts [place] Access [place] patients after they have their name applied to their card.   Self-Pay (no insurance) in [facility] [place]:  Organization         Address  Phone   Notes  Sickle Cell Patients, [facility] Internal Medicine [place] [phone]   [facility] [place] [phone]   [facility] [place]  [place], [place],  [phone]   [facility]/Dr. Osei-Bonsu  [address] [place] or [address], [place] [phone] Phone number for both [place] and [place] locations is the same.  Urgent Medical and [facility] [address], [place] [phone]   [facility] [address] [place] or [address] [phone]   [facility] [address], [place] [phone], phone; [phone], fax Sees patients 1st and 3rd Saturday of every month.  Must not qualify for public or private insurance (i.e. Medicaid, Medicare, [place] Health Choice, Veterans' Benefits)  Household income should be no more than 200% of the poverty level The clinic cannot treat you if you are pregnant or think you are pregnant  Sexually transmitted diseases are not treated at the clinic.    Dental Care: Organization         Address  Phone  Notes  [facility] Department of [facility] [place] [phone] Accepts children up to age 57 who are enrolled  in [place] or [place]; pregnant women with a Medicaid card; and children who have applied for Medicaid or [place] Health Choice, but were declined, whose parents can pay a reduced fee at time of service.  [facility] Department of [facility]  [address], [employer] [phone])  [phone] Accepts children up to age 34 who are enrolled in Medicaid or [place] Health Choice; pregnant women with a Medicaid card; and children who have applied for Medicaid or  Health Choice, but were declined, whose parents can pay a reduced fee at time of service.   Adult Dental Access PROGRAM  [place] [phone] Patients are seen by appointment only. Walk-ins are not accepted. [place] will see patients 29 years of age and older. Monday - Tuesday (8am-5pm) Most Wednesdays (8:30-5pm) $30 per visit, cash only  [facility] Adult Dental Access PROGRAM  [address], [facility] [phone] Patients are seen by appointment only. Walk-ins are not accepted. [place] will see patients 92 years of age and older. One Wednesday Evening (Monthly: Volunteer Based).  $30 per visit, cash only  [place]  [phone] for adults; Children under age 42, call Graduate Pediatric Dentistry at [phone]. Children aged 81-14, please call [phone] to request a pediatric application.  Dental services are provided in all areas of dental care including fillings, crowns and bridges, complete and partial dentures, implants, gum treatment, root canals, and extractions. Preventive care is also provided. Treatment is provided to both adults and children. Patients are selected via a lottery and there is often a waiting list.   [facility] [address], [place]  [phone] [email or web address]   [facility] [address] [place], [place] [phone], Ext. [phone] Second and Fourth Thursday of each month, opens at  6:30 AM; Clinic ends at 9 AM.  Patients are seen on a first-come first-served basis, and a limited number are seen during each clinic.   [facility]  [address] Hillard Danker [place], [place] [phone]   Eligibility Requirements You must have lived in [place], [place], or [place] counties for at least the last three months.   You cannot be eligible for state or federal sponsored [employer], including [employer], [place], or [employer].   You generally cannot be eligible for healthcare insurance through your employer.    How to apply: Eligibility screenings are held every Tuesday and Wednesday afternoon from 1:00 pm until 4:00 pm. You do not need an appointment for the interview!  [facility] [address], [place], [place]   [place]  [place] Department  [place]  [phone]    Behavioral Health Resources in the Community: Intensive Outpatient Programs Organization         Address  Phone  Notes  [place]. [address] [place], [place] [phone]   [facility] Outpatient [address] [place], [place]   ADS: Alcohol & Drug Svcs [address] [place], [place]   [place] [address]  [place], [place] or [phone]   Substance Abuse Resources Organization         Address  Phone  Notes  Alcohol and Drug Services  [phone]   [place]  [phone]   The [place]   Chinita Pester  [phone]   Residential & Outpatient Substance Abuse Program  [phone]   Psychological Services Organization         Address  Phone  Notes  [place]  [place]  [place]  [place] [address] [place] or [phone]    Mobile Crisis Teams Organization         Address  Phone  Notes  [facility]rapeutic Alternatives, Mobile Crisis Care Unit  [phone]   Assertive Psychotherapeutic Services  [address] [place], [place]   [place] [address] [place] [phone]    Self-Help/Support Groups Organization         Address  Phone             Notes  [place]. of [place] - variety of support groups  [place] Call for more information  Narcotics Anonymous (NA), [facility] [address], [employer] [place]  2 meetings at this location   Special educational needs teacher         Address  Phone  Notes  ASAP Residential Treatment [place],    [place]  [phone]   [facility]  [address] [place] [address], [place], [place]   [place], [place] [phone] Admissions: 8am-3pm M-F  Incentives Substance [place] [address]    [place], [place] [phone]   [facility] [address] [place], [place], [place]   [facility] [address],  [place], [place]   [facility]ight Programs - Intensive Outpatient [address]een [address], [place], [place]   [facility] ([place] [place].,  [place], [place] [phone] or [phone]   Residential Treatment Services (RTS) [address] [place], [place] Accepts Medicaid  [facility] [place] [address]  [place] [phone] Substance Abuse/Addiction Treatment   [facility] Organization         Address  Phone  Notes  [facility]  [phone]   Domenic Schwab, PhD [address] [place], [place]   [phone] or [phone]   [place], [place] [phone]   [facility]  [address], [place], [place] [phone] Insurance/Medicaid/sponsorship through [facility] and Families [address]                                    [place], [place] [phone] [place] [address], [place] [phone]    Dr. Adele Schilder  [phone]   [facility] Dept. 1) [address], [place] 2) [place] 3)  [place] [address], [place] [phone]  [phone]   [place] [phone] or [phone] (After Hours)

## 2014-07-07 ENCOUNTER — Encounter (HOSPITAL_COMMUNITY): Payer: Self-pay | Admitting: Emergency Medicine

## 2014-07-07 ENCOUNTER — Inpatient Hospital Stay (HOSPITAL_COMMUNITY)
Admission: EM | Admit: 2014-07-07 | Discharge: 2014-07-17 | DRG: 312 | Disposition: A | Discharge status: Home / Self Care | Payer: Medicare Other | Attending: Internal Medicine | Admitting: Internal Medicine

## 2014-07-07 ENCOUNTER — Emergency Department (HOSPITAL_COMMUNITY): Payer: Medicare Other

## 2014-07-07 DIAGNOSIS — Z7982 Long term (current) use of aspirin: Secondary | ICD-10-CM

## 2014-07-07 DIAGNOSIS — G8929 Other chronic pain: Secondary | ICD-10-CM | POA: Diagnosis present

## 2014-07-07 DIAGNOSIS — F1010 Alcohol abuse, uncomplicated: Secondary | ICD-10-CM | POA: Diagnosis present

## 2014-07-07 DIAGNOSIS — I951 Orthostatic hypotension: Principal | ICD-10-CM | POA: Diagnosis present

## 2014-07-07 DIAGNOSIS — R42 Dizziness and giddiness: Secondary | ICD-10-CM | POA: Diagnosis not present

## 2014-07-07 DIAGNOSIS — M5136 Other intervertebral disc degeneration, lumbar region: Secondary | ICD-10-CM | POA: Diagnosis present

## 2014-07-07 DIAGNOSIS — M545 Low back pain, unspecified: Secondary | ICD-10-CM | POA: Diagnosis present

## 2014-07-07 DIAGNOSIS — J449 Chronic obstructive pulmonary disease, unspecified: Secondary | ICD-10-CM | POA: Diagnosis not present

## 2014-07-07 DIAGNOSIS — Z79899 Other long term (current) drug therapy: Secondary | ICD-10-CM

## 2014-07-07 DIAGNOSIS — R531 Weakness: Secondary | ICD-10-CM | POA: Diagnosis not present

## 2014-07-07 DIAGNOSIS — M47896 Other spondylosis, lumbar region: Secondary | ICD-10-CM | POA: Diagnosis present

## 2014-07-07 DIAGNOSIS — G908 Other disorders of autonomic nervous system: Secondary | ICD-10-CM | POA: Diagnosis present

## 2014-07-07 DIAGNOSIS — M5416 Radiculopathy, lumbar region: Secondary | ICD-10-CM

## 2014-07-07 HISTORY — DX: Other chronic pain: G89.29

## 2014-07-07 HISTORY — DX: Other intervertebral disc degeneration, lumbar region without mention of lumbar back pain or lower extremity pain: M51.369

## 2014-07-07 HISTORY — DX: Other intervertebral disc degeneration, lumbar region: M51.36

## 2014-07-07 HISTORY — DX: Dorsalgia, unspecified: M54.9

## 2014-07-07 LAB — URINALYSIS, ROUTINE W REFLEX MICROSCOPIC
Glucose, UA: NEGATIVE mg/dL
Hgb urine dipstick: NEGATIVE
KETONES UR: 15 mg/dL — AB
Leukocytes, UA: NEGATIVE
NITRITE: NEGATIVE
PROTEIN: NEGATIVE mg/dL
Specific Gravity, Urine: 1.02 (ref 1.005–1.030)
UROBILINOGEN UA: 1 mg/dL (ref 0.0–1.0)
pH: 7 (ref 5.0–8.0)

## 2014-07-07 LAB — CBC WITH DIFFERENTIAL/PLATELET
BASOS PCT: 1 % (ref 0–1)
Basophils Absolute: 0 10*3/uL (ref 0.0–0.1)
EOS ABS: 0.1 10*3/uL (ref 0.0–0.7)
Eosinophils Relative: 1 % (ref 0–5)
HCT: 36.5 % — ABNORMAL LOW (ref 39.0–52.0)
Hemoglobin: 12 g/dL — ABNORMAL LOW (ref 13.0–17.0)
LYMPHS ABS: 1.7 10*3/uL (ref 0.7–4.0)
Lymphocytes Relative: 22 % (ref 12–46)
MCH: 28 pg (ref 26.0–34.0)
MCH: 22.7 pg — AB (ref 26.0–34.0)
MCHC: 32.9 g/dL (ref 30.0–36.0)
MCV: 69 fL — ABNORMAL LOW (ref 78.0–100.0)
Monocytes Absolute: 0.4 10*3/uL (ref 0.1–1.0)
Monocytes Relative: 6 % (ref 3–12)
NEUTROS PCT: 70 % (ref 43–77)
Neutro Abs: 5.3 10*3/uL (ref 1.7–7.7)
Platelets: 241 10*3/uL (ref 150–400)
RBC: 5.29 MIL/uL (ref 4.22–5.81)
RDW: 15 % (ref 11.5–15.5)
WBC: 7.6 10*3/uL (ref 4.0–10.5)

## 2014-07-07 LAB — RAPID URINE DRUG SCREEN, HOSP PERFORMED
Amphetamines: NOT DETECTED
BENZODIAZEPINES: NOT DETECTED
Barbiturates: NOT DETECTED
COCAINE: NOT DETECTED
Opiates: NOT DETECTED
Tetrahydrocannabinol: NOT DETECTED

## 2014-07-07 LAB — COMPREHENSIVE METABOLIC PANEL
ALK PHOS: 51 U/L (ref 39–117)
ALT: 6 U/L (ref 0–53)
ANION GAP: 11 (ref 5–15)
AST: 14 U/L (ref 0–37)
Albumin: 3.4 g/dL — ABNORMAL LOW (ref 3.5–5.2)
BUN: 11 mg/dL (ref 6–23)
CO2: 28 mEq/L (ref 19–32)
Calcium: 9.2 mg/dL (ref 8.4–10.5)
Chloride: 100 mEq/L (ref 96–112)
Creatinine, Ser: 1.11 mg/dL (ref 0.50–1.35)
GFR calc non Af Amer: 66 mL/min — ABNORMAL LOW (ref >90)
GFR, EST AFRICAN AMERICAN: 76 mL/min — AB (ref >90)
GLUCOSE: 92 mg/dL (ref 70–99)
POTASSIUM: 4.7 meq/L (ref 3.7–5.3)
Sodium: 139 mEq/L (ref 137–147)
TOTAL PROTEIN: 6.9 g/dL (ref 6.0–8.3)
Total Bilirubin: 0.6 mg/dL (ref 0.3–1.2)

## 2014-07-07 LAB — ETHANOL
Alcohol, Ethyl (B): <11 mg/dL (ref 0–11)
Alcohol, Ethyl (B): <11 mg/dL (ref 0–11)

## 2014-07-07 LAB — TROPONIN I
Troponin I: 0.33 ng/mL (ref <0.30)
Troponin I: <0.3 ng/mL (ref <0.30)

## 2014-07-07 MED ORDER — ALUM & MAG HYDROXIDE-SIMETH 200-200-20 MG/5ML PO SUSP: 30.0000 mL | Freq: Four times a day (QID) | ORAL | Status: DC | PRN

## 2014-07-07 MED ORDER — DOCUSATE SODIUM 100 MG PO CAPS: 100.0000 mg | ORAL_CAPSULE | Freq: Every day | ORAL | Status: DC | PRN

## 2014-07-07 MED ORDER — SODIUM CHLORIDE 0.9 % IV SOLN
INTRAVENOUS | Status: DC
  Administered 2014-07-08 – 2014-07-09 (×2): via INTRAVENOUS

## 2014-07-07 MED ORDER — ENOXAPARIN SODIUM 40 MG/0.4ML ~~LOC~~ SOLN
40.0000 mg | SUBCUTANEOUS | Status: DC
Start: 2014-07-08 — End: 2014-07-17
  Administered 2014-07-08 – 2014-07-17 (×10): 40 mg via SUBCUTANEOUS
  Filled 2014-07-07 (×10): qty 0.4

## 2014-07-07 MED ORDER — SODIUM CHLORIDE 0.9 % IV SOLN: INTRAVENOUS | Status: DC

## 2014-07-07 MED ORDER — ASPIRIN EC 81 MG PO TBEC
81.0000 mg | DELAYED_RELEASE_TABLET | Freq: Every day | ORAL | Status: DC
  Administered 2014-07-08 – 2014-07-17 (×10): 81 mg via ORAL
  Filled 2014-07-07 (×10): qty 1

## 2014-07-07 MED ORDER — SODIUM CHLORIDE 0.9 % IJ SOLN
3.0000 mL | Freq: Two times a day (BID) | INTRAMUSCULAR | Status: DC
  Administered 2014-07-08 – 2014-07-17 (×16): 3 mL via INTRAVENOUS

## 2014-07-07 MED ORDER — HYDROCODONE-ACETAMINOPHEN 5-325 MG PO TABS
1.0000 | ORAL_TABLET | Freq: Four times a day (QID) | ORAL | Status: DC | PRN
Start: 2014-07-07 — End: 2014-07-17

## 2014-07-07 MED ORDER — ACETAMINOPHEN 650 MG RE SUPP: 650.0000 mg | Freq: Four times a day (QID) | RECTAL | Status: DC | PRN

## 2014-07-07 MED ORDER — ACETAMINOPHEN 325 MG PO TABS: 650.0000 mg | ORAL_TABLET | Freq: Four times a day (QID) | ORAL | Status: DC | PRN

## 2014-07-07 MED ORDER — SODIUM CHLORIDE 0.9 % IV SOLN
INTRAVENOUS | Status: DC
  Administered 2014-07-07: 18:00:00 via INTRAVENOUS

## 2014-07-07 MED ORDER — SODIUM CHLORIDE 0.9 % IV BOLUS (SEPSIS)
500.0000 mL | Freq: Once | INTRAVENOUS | Status: AC
  Administered 2014-07-07: 500 mL via INTRAVENOUS

## 2014-07-07 MED ORDER — ASPIRIN 81 MG PO CHEW: 324.0000 mg | CHEWABLE_TABLET | Freq: Once | ORAL | Status: DC

## 2014-07-07 MED ORDER — SODIUM CHLORIDE 0.9 % IV BOLUS (SEPSIS): 1000.0000 mL | Freq: Once | INTRAVENOUS | Status: AC

## 2014-07-07 NOTE — ED Notes (Signed)
CRITICAL VALUE ALERT  Critical value received:  troponin  Date of notification:  07/07/14  Time of notification:  3524  Critical value read back:Yes.    Nurse who received alert:  Kimika Streater,RN  MD notified (1st page):  [place]  Time of first page:  [phone]

## 2014-07-07 NOTE — ED Notes (Signed)
Did not ambulate patient after repeating orthostatic VS, Standing BP dropped to 72/55.

## 2014-07-07 NOTE — ED Provider Notes (Signed)
CSN: [id number]     Arrival date & time 07/07/14  1508 History   First MD Initiated Contact with Patient 07/07/14 1619     Chief Complaint  Patient presents with  . Weakness  . Dizziness      HPI  Pt was seen at 1640. Per pt, c/o gradual onset and worsening of persistent lightheadedness for the past 3 days. Describes the lightheadedness as "I feel like I'm going to pass out." Denies vertigo, no focal motor weakness, no tingling/numbness in extremities, no syncope, no CP/palpitations, no SOB/cough, no abd pain, no N/V/D, no fevers.    Past Medical History  Diagnosis Date  . Seizures   . Chronic back pain   . DDD (degenerative disc disease), lumbar    History reviewed. No pertinent past surgical history.  History  Substance Use Topics  . Smoking status: Never Smoker   . Smokeless tobacco: Not on file  . Alcohol Use: No    Review of Systems ROS: Statement: All systems negative except as marked or noted in the HPI; Constitutional: Negative for fever and chills. ; ; Eyes: Negative for eye pain, redness and discharge. ; ; ENMT: Negative for ear pain, hoarseness, nasal congestion, sinus pressure and sore throat. ; ; Cardiovascular: Negative for chest pain, palpitations, diaphoresis, dyspnea and peripheral edema. ; ; Respiratory: Negative for cough, wheezing and stridor. ; ; Gastrointestinal: Negative for nausea, vomiting, diarrhea, abdominal pain, blood in stool, hematemesis, jaundice and rectal bleeding. . ; ; Genitourinary: Negative for dysuria, flank pain and hematuria. ; ; Musculoskeletal: Negative for back pain and neck pain. Negative for swelling and trauma.; ; Skin: Negative for pruritus, rash, abrasions, blisters, bruising and skin lesion.; ; Neuro: +lightheaded. Negative for headache and neck stiffness. Negative for weakness, altered level of consciousness , altered mental status, extremity weakness, paresthesias, involuntary movement, seizure and syncope.     Allergies  Review  of patient's allergies indicates no known allergies.  Home Medications   Prior to Admission medications   Medication Sig Start Date End Date Taking? Authorizing Provider  aspirin EC 81 MG tablet Take 81 mg by mouth daily.   Yes Historical Provider, MD  HYDROcodone-acetaminophen (NORCO/VICODIN) 5-325 MG per tablet Take 1 tablet by mouth every 6 (six) hours as needed for moderate pain or severe pain. 07/04/14  Yes Quintella Reichert, MD  traMADol (ULTRAM) 50 MG tablet Take 1 tablet (50 mg total) by mouth every 6 (six) hours as needed. Patient not taking: Reported on 07/07/2014 06/25/14   Montine Circle, PA-C   BP 123/75 mmHg  Pulse 72  Temp(Src) 97.9 F (36.6 C) (Oral)  Resp 14  Ht 6\' 3"  (1.905 m)  Wt 190 lb (86.183 kg)  BMI 23.75 kg/m2  SpO2 100% Physical Exam  1645: Physical examination:  Nursing notes reviewed; Vital signs and O2 SAT reviewed;  Constitutional: Well developed, Well nourished, Well hydrated, In no acute distress; Head:  Normocephalic, atraumatic; Eyes: EOMI, PERRL, No scleral icterus; ENMT: Mouth and pharynx normal, Mucous membranes moist; Neck: Supple, Full range of motion, No lymphadenopathy; Cardiovascular: Regular rate and rhythm, No gallop; Respiratory: Breath sounds clear & equal bilaterally, No wheezes. Speaking full sentences with ease, Normal respiratory effort/excursion; Chest: Nontender, Movement normal; Abdomen: Soft, Nontender, Nondistended, Normal bowel sounds; Genitourinary: No CVA tenderness; Extremities: Pulses normal, No tenderness, No edema, No calf edema or asymmetry.; Neuro: AA&Ox3, Major CN grossly intact. Speech clear. No facial droop.  No nystagmus. Grips equal. Strength 5/5 equal bilat UE's and LE's.  DTR 2/4 equal bilat UE's and LE's.  No gross sensory deficits.  Normal cerebellar testing bilat UE's (finger-nose) and LE's (heel-shin).; Skin: Color normal, Warm, Dry.   ED Course  Procedures     EKG Interpretation   Date/Time:  Saturday July 07 2014 16:16:56 EST Ventricular Rate:  84 PR Interval:  142 QRS Duration: 95 QT Interval:  374 QTC Calculation: 442 R Axis:   -56 Text Interpretation:  Sinus rhythm LAD, consider left anterior fascicular  block Baseline wander When compared with ECG of 07/04/2014 No significant  change was found Confirmed by [facility]  MD, Nunzio Cory [phone]) on 07/07/2014  5:16:47 PM      MDM  MDM Reviewed: previous chart, nursing note and vitals Reviewed previous: labs, ECG and CT scan Interpretation: ECG, labs and x-ray Total time providing critical care: 30-74 minutes. This excludes time spent performing separately reportable procedures and services. Consults: admitting MD   CRITICAL CARE Performed by: Alfonzo Feller Total critical care time: 35 Critical care time was exclusive of separately billable procedures and treating other patients. Critical care was necessary to treat or prevent imminent or life-threatening deterioration. Critical care was time spent personally by me on the following activities: development of treatment plan with patient and/or surrogate as well as nursing, discussions with consultants, evaluation of patient's response to treatment, examination of patient, obtaining history from patient or surrogate, ordering and performing treatments and interventions, ordering and review of laboratory studies, ordering and review of radiographic studies, pulse oximetry and re-evaluation of patient's condition.    Results for orders placed or performed during the hospital encounter of 07/07/14  Comprehensive metabolic panel  Result Value Ref Range   GFR calc non Af Amer NOT CALCULATED >90 mL/min   GFR calc Af Amer NOT CALCULATED >90 mL/min  Ethanol  Result Value Ref Range   Alcohol, Ethyl (B) <11 0 - 11 mg/dL  Troponin I  Result Value Ref Range   Troponin I 0.33 (HH) <0.30 ng/mL  CBC with Differential  Result Value Ref Range   MCH 28.0 26.0 - 34.0 pg   LUCs, %  0 - 4 %     QUESTIONABLE IDENTIFICATION / INCORRECTLY LABELED SPECIMEN   LUC, Absolute  0.0 - 0.5 K/uL    QUESTIONABLE IDENTIFICATION / INCORRECTLY LABELED SPECIMEN   Other  %    QUESTIONABLE IDENTIFICATION / INCORRECTLY LABELED SPECIMEN   Other 2  %    QUESTIONABLE IDENTIFICATION / INCORRECTLY LABELED SPECIMEN  Urinalysis, Routine w reflex microscopic  Result Value Ref Range   Color, Urine ORANGE (A) YELLOW   APPearance CLEAR CLEAR   Specific Gravity, Urine 1.020 1.005 - 1.030   pH 7.0 5.0 - 8.0   Glucose, UA NEGATIVE NEGATIVE mg/dL   Hgb urine dipstick NEGATIVE NEGATIVE   Bilirubin Urine SMALL (A) NEGATIVE   Ketones, ur 15 (A) NEGATIVE mg/dL   Protein, ur NEGATIVE NEGATIVE mg/dL   Urobilinogen, UA 1.0 0.0 - 1.0 mg/dL   Nitrite NEGATIVE NEGATIVE   Leukocytes, UA NEGATIVE NEGATIVE  Urine rapid drug screen (hosp performed)  Result Value Ref Range   Opiates NONE DETECTED NONE DETECTED   Cocaine NONE DETECTED NONE DETECTED   Benzodiazepines NONE DETECTED NONE DETECTED   Amphetamines NONE DETECTED NONE DETECTED   Tetrahydrocannabinol NONE DETECTED NONE DETECTED   Barbiturates NONE DETECTED NONE DETECTED  Comprehensive metabolic panel  Result Value Ref Range   Sodium 139 137 - 147 mEq/L   Potassium 4.7 3.7 - 5.3 mEq/L  Chloride 100 96 - 112 mEq/L   CO2 28 19 - 32 mEq/L   Glucose, Bld 92 70 - 99 mg/dL   BUN 11 6 - 23 mg/dL   Creatinine, Ser 1.11 0.50 - 1.35 mg/dL   Calcium 9.2 8.4 - 10.5 mg/dL   Total Protein 6.9 6.0 - 8.3 g/dL   Albumin 3.4 (L) 3.5 - 5.2 g/dL   AST 14 0 - 37 U/L   ALT 6 0 - 53 U/L   Alkaline Phosphatase 51 39 - 117 U/L   Total Bilirubin 0.6 0.3 - 1.2 mg/dL   GFR calc non Af Amer 66 (L) >90 mL/min   GFR calc Af Amer 76 (L) >90 mL/min   Anion gap 11 5 - 15  Ethanol  Result Value Ref Range   Alcohol, Ethyl (B) <11 0 - 11 mg/dL  CBC with Differential  Result Value Ref Range   WBC 7.6 4.0 - 10.5 K/uL   RBC 5.29 4.22 - 5.81 MIL/uL   Hemoglobin 12.0 (L) 13.0 - 17.0  g/dL   HCT 36.5 (L) 39.0 - 52.0 %   MCV 69.0 (L) 78.0 - 100.0 fL   MCH 22.7 (L) 26.0 - 34.0 pg   MCHC 32.9 30.0 - 36.0 g/dL   RDW 15.0 11.5 - 15.5 %   Platelets 241 150 - 400 K/uL   Neutrophils Relative % 70 43 - 77 %   Neutro Abs 5.3 1.7 - 7.7 K/uL   Lymphocytes Relative 22 12 - 46 %   Lymphs Abs 1.7 0.7 - 4.0 K/uL   Monocytes Relative 6 3 - 12 %   Monocytes Absolute 0.4 0.1 - 1.0 K/uL   Eosinophils Relative 1 0 - 5 %   Eosinophils Absolute 0.1 0.0 - 0.7 K/uL   Basophils Relative 1 0 - 1 %   Basophils Absolute 0.0 0.0 - 0.1 K/uL  Troponin I  Result Value Ref Range   Troponin I <0.30 <0.30 ng/mL   Dg Chest 2 View 07/07/2014   CLINICAL DATA:  Weakness and dizziness for the past 3 days.  EXAM: CHEST  2 VIEW  COMPARISON:  07/04/2014.  FINDINGS: Normal sized heart. Clear lungs with normal vascularity. The lungs remain hyperexpanded. Mild thoracic spine degenerative changes and mild scoliosis.  IMPRESSION: No acute abnormality.  Stable changes of COPD.   Electronically Signed   By: Enrique Sack M.D.   On: 07/07/2014 17:43   Dg Lumbar Spine Complete 07/04/2014   CLINICAL DATA:  Severe low back pain for 1 month. Radiates down both legs.  EXAM: LUMBAR SPINE - COMPLETE 4+ VIEW  COMPARISON:  05/11/2013  FINDINGS: Degenerative disc disease in the mid and lower lumbar spine. Slight anterolisthesis of L4 on L5, stable. Disc spaces are maintained. No fracture. SI joints are symmetric and unremarkable.  IMPRESSION: Degenerative facet changes.  No acute bony abnormality.   Electronically Signed   By: Rolm Baptise M.D.   On: 07/04/2014 18:08   Ct Angio Chest Pe W/cm &/or Wo Cm 07/04/2014   CLINICAL DATA:  Short of breath, back pain  EXAM: CT ANGIOGRAPHY CHEST WITH CONTRAST  TECHNIQUE: Multidetector CT imaging of the chest was performed using the standard protocol during bolus administration of intravenous contrast. Multiplanar CT image reconstructions and MIPs were obtained to evaluate the vascular  anatomy.  CONTRAST:  19mL OMNIPAQUE IOHEXOL 350 MG/ML SOLN  COMPARISON:  None.  FINDINGS: No filling defects within the pulmonary arteries to suggest acute pulmonary embolism. No acute findings  of the aorta or great vessels. No pericardial fluid. Esophagus is normal.  Lung parenchyma demonstrates centrilobular emphysema in the upper lobes.  There is no axillary supraclavicular adenopathy. There is a prevascular lymph node measuring 5 mm. No paratracheal adenopathy. No hilar adenopathy  Limited view of the upper abdomen is unremarkable. Limited view of the skeleton demonstrates degenerate spurring of the endplates.  Review of the MIP images confirms the above findings.  IMPRESSION: 1. No evidence of acute pulmonary embolism. 2. No acute pulmonary parenchymal findings. 3. Centrilobular emphysema in upper lobes. 4. Degenerative change of the spine.   Electronically Signed   By: Suzy Bouchard M.D.   On: 07/04/2014 18:49    2025:  Pt continues hypotensive and symptomatic during standing despite multiple IVF boluses and IVF infusion. Unable to ambulate due to lightheadedness. Denies any other symptoms. Initial labs mislabeled; labs recollected and are reassuring. Dx and testing d/w pt.  Questions answered.  Verb understanding, agreeable to admit. T/C to [facility]. Nehemiah Settle, case discussed, including:  HPI, pertinent PM/SHx, VS/PE, dx testing, ED course and treatment:  Agreeable to admit, requests to write temporary orders, obtain medical bed to team APAdmits.   Francine Graven, DO 07/09/14 [phone]

## 2014-07-07 NOTE — ED Notes (Signed)
Having dizziness since Wed.  Was seen at [facility] and was given hydrocodone.

## 2014-07-07 NOTE — ED Notes (Signed)
Pt states he still feels light headed, uncomfortable ambulating at this time. Will attempt again in 10-15 minutes.

## 2014-07-07 NOTE — H&P (Signed)
History and Physical  SURAJ RAMDASS YWV:[id number] DOB: 05/31/1946 DOA: 07/07/2014  Referring physician: Dr Thurnell Garbe, ED physician PCP: PROVIDER NOT IN SYSTEM   Chief Complaint: I feel like going to pass out  HPI: YOSEF KROGH is a 68 y.o. male  Who is presumably healthy, although he has not seen a primary care physician for many years. He has a remote history of alcohol abuse and has visited the ER 4 times over the past several weeks. His recent complaint is presyncope that started on Wednesday and has gradually worsened since that time. This presyncopal feeling occurs within a few moments of him standing up. The feeling does not go away if he stands at the side of his bed for a few moments before moving. He denies decreased fluid intake, diarrhea, nausea, vomiting.    Review of Systems:   Pt complains of decreased appetite, weight loss, chronic back pain.  Pt denies any fevers, chills, nausea, vomiting, chest pain, palpitations, tachycardia, presyncope at rest, syncope, shortness of breath, abdominal pain, vertigo, vision changes.  Review of systems are otherwise negative  Past Medical History  Diagnosis Date  . Seizures   . Chronic back pain   . DDD (degenerative disc disease), lumbar    History reviewed. No pertinent past surgical history. Social History:  reports that he has never smoked. He does not have any smokeless tobacco history on file. He reports that he does not drink alcohol or use illicit drugs. Patient lives at home & is able to participate in activities of daily liwithout assistance  No Known Allergies  History reviewed. No pertinent family history.    Prior to Admission medications   Medication Sig Start Date End Date Taking? Authorizing Provider  aspirin EC 81 MG tablet Take 81 mg by mouth daily.   Yes Historical Provider, MD  HYDROcodone-acetaminophen (NORCO/VICODIN) 5-325 MG per tablet Take 1 tablet by mouth every 6 (six) hours as needed for  moderate pain or severe pain. 07/04/14  Yes Quintella Reichert, MD  traMADol (ULTRAM) 50 MG tablet Take 1 tablet (50 mg total) by mouth every 6 (six) hours as needed. Patient not taking: Reported on 07/07/2014 06/25/14   Montine Circle, PA-C    Physical Exam: BP 118/75 mmHg  Pulse 93  Temp(Src) 97.9 F (36.6 C) (Oral)  Resp 15  Ht 6\' 3"  (1.905 m)  Wt 86.183 kg (190 lb)  BMI 23.75 kg/m2  SpO2 98%  General:  elderly black male. Awake and alert and oriented x3. No acute cardiopulmonary distress.  Eyes: Pupils equal, round, reactive to light. Extraocular muscles are intact. Sclerae anicteric and noninjected.  ENT: External auditory canals are patent and tympanic membranes reflect a good cone of light. Moist mucosal membranes. No mucosal lesions.  Neck: Neck supple without lymphadenopathy. No carotid bruits. No masses palpated.  Cardiovascular: Regular rate with normal S1-S2 sounds. No murmurs, rubs, gallops auscultated. No JVD.  Respiratory: Good respiratory effort with no wheezes, rales, rhonchi. Lungs clear to auscultation bilaterally.  Abdomen: Soft, nontender, nondistended. Active bowel sounds. No masses or hepatosplenomegaly  Skin: Dry, warm to touch. 2+ dorsalis pedis and radial pulses. Musculoskeletal: No calf or leg pain. All major joints not erythematous nontender.  Psychiatric: Intact judgment and insight.  Neurologic: No focal neurological deficits. Cranial nerves II through XII are grossly intact.           Labs on Admission:  Basic Metabolic Panel:  Recent Labs Lab 07/04/14 1525 07/07/14 1930  NA 136* 139  K 4.6 4.7  CL 95* 100  CO2 27 28  GLUCOSE 104* 92  BUN 15 11  CREATININE 1.40* 1.11  CALCIUM 9.7 9.2   Liver Function Tests:  Recent Labs Lab 07/07/14 1930  AST 14  ALT 6  ALKPHOS 51  BILITOT 0.6  PROT 6.9  ALBUMIN 3.4*   No results for input(s): LIPASE, AMYLASE in the last 168 hours. No results for input(s): AMMONIA in the last 168  hours. CBC:  Recent Labs Lab 07/04/14 1525 07/07/14 1930  WBC 11.3* 7.6  NEUTROABS 9.5* 5.3  HGB 12.0* 12.0*  HCT 37.4* 36.5*  MCV 69.1* 69.0*  PLT 236 241   Cardiac Enzymes:  Recent Labs Lab 07/07/14 1730 07/07/14 1930  TROPONINI 0.33* <0.30    BNP (last 3 results)  Recent Labs  06/25/14 1325 07/04/14 1525  PROBNP 27.9 36.7   CBG: No results for input(s): GLUCAP in the last 168 hours.  Radiological Exams on Admission: Dg Chest 2 View  07/07/2014   CLINICAL DATA:  Weakness and dizziness for the past 3 days.  EXAM: CHEST  2 VIEW  COMPARISON:  07/04/2014.  FINDINGS: Normal sized heart. Clear lungs with normal vascularity. The lungs remain hyperexpanded. Mild thoracic spine degenerative changes and mild scoliosis.  IMPRESSION: No acute abnormality.  Stable changes of COPD.   Electronically Signed   By: Enrique Sack M.D.   On: 07/07/2014 17:43    EKG: Independently reviewed. Sinus rhythm. No acute ST changes  Assessment/Plan Present on Admission:  . Orthostatic hypotension . Orthostasis   #1 orthostatic hypotension Uncertain etiology. The patient does not report any fluid losses, and his labs are normal. His troponin is also normal, suggesting that the etiology is not an acute coronary syndrome. His urine is a little concentrated with a specific gravity of 1.020, which is suggestive that the patient may not have adequate oral intake.  We'll give another liter bolus of fluid and continue on IV fluids overnight. We'll obtain echocardiogram to assure that there is no cardiac etiology of the patient's orthostasis. I anticipate that the patient should be able to return home tomorrow.  DVT prophylaxis:  Lovenox  Consultants:  none  Code Status:  full  Family Communication:  none   Disposition Plan:  home following rehydration.  Time spent:  50 minutes  [place], [place] [facility] Pager [phone]

## 2014-07-08 DIAGNOSIS — I951 Orthostatic hypotension: Secondary | ICD-10-CM | POA: Diagnosis not present

## 2014-07-08 DIAGNOSIS — I369 Nonrheumatic tricuspid valve disorder, unspecified: Secondary | ICD-10-CM | POA: Diagnosis not present

## 2014-07-08 LAB — BASIC METABOLIC PANEL
Anion gap: 11 (ref 5–15)
BUN: 10 mg/dL (ref 6–23)
CALCIUM: 8.6 mg/dL (ref 8.4–10.5)
CHLORIDE: 103 meq/L (ref 96–112)
CO2: 26 meq/L (ref 19–32)
Creatinine, Ser: 1.06 mg/dL (ref 0.50–1.35)
GFR calc non Af Amer: 70 mL/min — ABNORMAL LOW (ref >90)
GFR, EST AFRICAN AMERICAN: 81 mL/min — AB (ref >90)
Glucose, Bld: 83 mg/dL (ref 70–99)
Potassium: 4.2 mEq/L (ref 3.7–5.3)
SODIUM: 140 meq/L (ref 137–147)

## 2014-07-08 LAB — CBC
HEMATOCRIT: 35.1 % — AB (ref 39.0–52.0)
Hemoglobin: 11.6 g/dL — ABNORMAL LOW (ref 13.0–17.0)
MCH: 22.7 pg — ABNORMAL LOW (ref 26.0–34.0)
MCHC: 33 g/dL (ref 30.0–36.0)
MCV: 68.8 fL — ABNORMAL LOW (ref 78.0–100.0)
PLATELETS: 231 10*3/uL (ref 150–400)
RBC: 5.1 MIL/uL (ref 4.22–5.81)
RDW: 14.9 % (ref 11.5–15.5)
WBC: 6.9 10*3/uL (ref 4.0–10.5)

## 2014-07-08 NOTE — Progress Notes (Signed)
  Echocardiogram 2D Echocardiogram has been performed.  Roseanna Rainbow R 07/08/2014, 1:40 PM

## 2014-07-08 NOTE — Progress Notes (Signed)
[facility] PROGRESS NOTE  Erik [name] IDP:[id number] DOB: Mar 31, 1946 DOA: 07/07/2014 PCP: PROVIDER NOT IN SYSTEM  Assessment/Plan: Active Problems:   Orthostatic hypotension - We'll place on regular diet - Continue IV fluid rehydration - Place TED hose - On evaluation this a.m. was still hypotensive on orthostatic vital signs evaluation - Physical therapy evaluation  Code Status: full Family Communication: none at bedside Disposition Plan: With improvement or resolution of orthostatic hypotension   Consultants:  None  Procedures:  None  Antibiotics:  None  HPI/Subjective: Patient has no new complaints. However when standing up to obtain orthostatic vital sign was hypotensive and became dizzy.  Objective: Filed Vitals:   07/08/14 1144  BP:   Pulse:   Temp: 97.6 F (36.4 C)  Resp:     Intake/Output Summary (Last 24 hours) at 07/08/14 1423 Last data filed at 07/08/14 4431  Gross per 24 hour  Intake    363 ml  Output    775 ml  Net   -412 ml   Filed Weights   07/07/14 1516  Weight: 86.183 kg (190 lb)    Exam:   General:  Patient in no acute distress, alert and awake  Cardiovascular: Regular rate and rhythm, no murmurs rubs  Respiratory: Clear to auscultation bilaterally, no wheezes  Abdomen: soft, ND, NT  Musculoskeletal: Pt has no clubbing. Tone at upper extremities wnl   Data Reviewed: Basic Metabolic Panel:  Recent Labs Lab 07/04/14 1525 07/07/14 1930 07/08/14 0635  NA 136* 139 140  K 4.6 4.7 4.2  CL 95* 100 103  CO2 27 28 26   GLUCOSE 104* 92 83  BUN 15 11 10   CREATININE 1.40* 1.11 1.06  CALCIUM 9.7 9.2 8.6   Liver Function Tests:  Recent Labs Lab 07/07/14 1930  AST 14  ALT 6  ALKPHOS 51  BILITOT 0.6  PROT 6.9  ALBUMIN 3.4*   No results for input(s): LIPASE, AMYLASE in the last 168 hours. No results for input(s): AMMONIA in the last 168 hours. CBC:  Recent Labs Lab 07/04/14 1525 07/07/14 1930  07/08/14 0635  WBC 11.3* 7.6 6.9  NEUTROABS 9.5* 5.3  --   HGB 12.0* 12.0* 11.6*  HCT 37.4* 36.5* 35.1*  MCV 69.1* 69.0* 68.8*  PLT 236 241 231   Cardiac Enzymes:  Recent Labs Lab 07/07/14 1730 07/07/14 1930  TROPONINI 0.33* <0.30   BNP (last 3 results)  Recent Labs  06/25/14 1325 07/04/14 1525  PROBNP 27.9 36.7   CBG: No results for input(s): GLUCAP in the last 168 hours.  No results found for this or any previous visit (from the past 240 hour(s)).   Studies: Dg Chest 2 View  07/07/2014   CLINICAL DATA:  Weakness and dizziness for the past 3 days.  EXAM: CHEST  2 VIEW  COMPARISON:  07/04/2014.  FINDINGS: Normal sized heart. Clear lungs with normal vascularity. The lungs remain hyperexpanded. Mild thoracic spine degenerative changes and mild scoliosis.  IMPRESSION: No acute abnormality.  Stable changes of COPD.   Electronically Signed   By: Enrique Sack M.D.   On: 07/07/2014 17:43    Scheduled Meds: . sodium chloride   Intravenous STAT  . aspirin EC  81 mg Oral Daily  . enoxaparin (LOVENOX) injection  40 mg Subcutaneous Q24H  . sodium chloride  1,000 mL Intravenous Once  . sodium chloride  3 mL Intravenous Q12H   Continuous Infusions: . sodium chloride       Time spent: > 35 minutes  Velvet Bathe  [facility] Pager [phone] If 7PM-7AM, please contact night-coverage at [email or web address], password [facility] 07/08/2014, 2:23 PM  LOS: 1 day

## 2014-07-08 NOTE — Progress Notes (Signed)
UR completed 

## 2014-07-08 NOTE — Plan of Care (Signed)
Problem: Phase I Progression Outcomes Goal: Voiding-avoid urinary catheter unless indicated Outcome: Completed/Met Date Met:  07/08/14

## 2014-07-08 NOTE — Plan of Care (Signed)
Problem: Phase III Progression Outcomes Goal: Voiding independently Outcome: Completed/Met Date Met:  07/08/14

## 2014-07-09 DIAGNOSIS — M47896 Other spondylosis, lumbar region: Secondary | ICD-10-CM | POA: Diagnosis present

## 2014-07-09 DIAGNOSIS — Z79899 Other long term (current) drug therapy: Secondary | ICD-10-CM | POA: Diagnosis not present

## 2014-07-09 DIAGNOSIS — Z7982 Long term (current) use of aspirin: Secondary | ICD-10-CM | POA: Diagnosis not present

## 2014-07-09 DIAGNOSIS — G908 Other disorders of autonomic nervous system: Secondary | ICD-10-CM | POA: Diagnosis present

## 2014-07-09 DIAGNOSIS — M5416 Radiculopathy, lumbar region: Secondary | ICD-10-CM | POA: Diagnosis not present

## 2014-07-09 DIAGNOSIS — J449 Chronic obstructive pulmonary disease, unspecified: Secondary | ICD-10-CM | POA: Diagnosis present

## 2014-07-09 DIAGNOSIS — M5441 Lumbago with sciatica, right side: Secondary | ICD-10-CM | POA: Diagnosis not present

## 2014-07-09 DIAGNOSIS — M4806 Spinal stenosis, lumbar region: Secondary | ICD-10-CM | POA: Diagnosis not present

## 2014-07-09 DIAGNOSIS — G609 Hereditary and idiopathic neuropathy, unspecified: Secondary | ICD-10-CM | POA: Diagnosis not present

## 2014-07-09 DIAGNOSIS — F1010 Alcohol abuse, uncomplicated: Secondary | ICD-10-CM | POA: Diagnosis present

## 2014-07-09 DIAGNOSIS — M5126 Other intervertebral disc displacement, lumbar region: Secondary | ICD-10-CM | POA: Diagnosis not present

## 2014-07-09 DIAGNOSIS — M5136 Other intervertebral disc degeneration, lumbar region: Secondary | ICD-10-CM | POA: Diagnosis not present

## 2014-07-09 DIAGNOSIS — R42 Dizziness and giddiness: Secondary | ICD-10-CM | POA: Diagnosis present

## 2014-07-09 DIAGNOSIS — G8929 Other chronic pain: Secondary | ICD-10-CM | POA: Diagnosis present

## 2014-07-09 DIAGNOSIS — I951 Orthostatic hypotension: Secondary | ICD-10-CM | POA: Diagnosis not present

## 2014-07-09 MED ORDER — SODIUM CHLORIDE 0.9 % IV BOLUS (SEPSIS): 500.0000 mL | Freq: Once | INTRAVENOUS | Status: DC

## 2014-07-09 MED ORDER — SODIUM CHLORIDE 0.9 % IV BOLUS (SEPSIS)
250.0000 mL | Freq: Once | INTRAVENOUS | Status: AC
  Administered 2014-07-09: 250 mL via INTRAVENOUS

## 2014-07-09 NOTE — Care Management Note (Addendum)
    Page 1 of 2   07/17/2014     3:01:58 PM CARE MANAGEMENT NOTE 07/17/2014  Patient:  Erik [name], Erik [name]   Account Number:  [id number]  Date Initiated:  07/09/2014  Documentation initiated by:  Theophilus Kinds  Subjective/Objective Assessment:   Pt admitted from home with orthostatic hypotension. Pt lives alone and will return home at discharge. Pt is independent with ADl's.     Action/Plan:   No CM needs noted.   Anticipated DC Date:  07/10/2014   Anticipated DC Plan:  [place]  CM consult      [facility] Choice  HOME HEALTH   Choice offered to / List presented to:  C-1 Patient        [place] arranged  HH-1 RN      [place].   Status of service:  Completed, signed off Medicare Important Message given?  YES (If response is "NO", the following Medicare IM given date fields will be blank) Date Medicare IM given:  07/16/2014 Medicare IM given by:  Theophilus Kinds Date Additional Medicare IM given:   Additional Medicare IM given by:    Discharge Disposition:  [place]  Per UR Regulation:    If discussed at Long Length of Stay Meetings, dates discussed:   07/12/2014  07/17/2014    Comments:  07/17/14 1455 Christinia Gully, RN BSN CM Pt to be discharged home today. PCP appt made with [facility] and documented on AVS. Pt made aware also. Appt followup made with Dr Merlene Laughter. Pt wants to discuss [facility] RN with daughter. If both agree, pt choses [facility]. Staff RN to call and fax orders to [facility] once pt decides. Pt and pts nurse aware of discharge arrangements.  07/16/14 1500 Christinia Gully, RN BSN CM Pt still having issues with symptomatic orthostatic BP. Neurology consult pending. Once cleared by neuro pt to discharge home.  07/09/14 [place], RN BSN CM

## 2014-07-09 NOTE — Plan of Care (Signed)
Problem: Phase I Progression Outcomes Goal: Pain controlled with appropriate interventions Outcome: Completed/Met Date Met:  07/09/14     

## 2014-07-09 NOTE — Progress Notes (Signed)
UR completed 

## 2014-07-09 NOTE — Progress Notes (Signed)
[facility] PROGRESS NOTE  DARDEN FLEMISTER WER:[id number] DOB: Jul 03, 1946 DOA: 07/07/2014 PCP: PROVIDER NOT IN SYSTEM  Assessment/Plan: Active Problems:   Orthostatic hypotension - We'll place on regular diet - Continue IV fluid rehydration - Place TED hose - On evaluation this a.m. 07/09/14 was still hypotensive on orthostatic vital signs evaluation - Physical therapy evaluation  Code Status: full Family Communication: discussed with daughter over phone with patient's permission Disposition Plan: With improvement or resolution of orthostatic hypotension   Consultants:  None  Procedures:  None  Antibiotics:  None  HPI/Subjective: Patient wanted me to take daughter. Still having some dizziness with standing.  Objective: Filed Vitals:   07/09/14 1338  BP: 124/100  Pulse: 71  Temp: 97.9 F (36.6 C)  Resp: 19    Intake/Output Summary (Last 24 hours) at 07/09/14 1445 Last data filed at 07/09/14 1300  Gross per 24 hour  Intake    720 ml  Output   1151 ml  Net   -431 ml   Filed Weights   07/07/14 1516  Weight: 86.183 kg (190 lb)    Exam:   General:  Patient in no acute distress, alert and awake, sitting at the side of the bed smiling and.  Cardiovascular: Regular rate and rhythm, no murmurs rubs  Respiratory: Clear to auscultation bilaterally, no wheezes  Abdomen: soft, ND, NT  Musculoskeletal: Pt has no clubbing. Tone at upper extremities wnl   Data Reviewed: Basic Metabolic Panel:  Recent Labs Lab 07/04/14 1525 07/07/14 1930 07/08/14 0635  NA 136* 139 140  K 4.6 4.7 4.2  CL 95* 100 103  CO2 27 28 26   GLUCOSE 104* 92 83  BUN 15 11 10   CREATININE 1.40* 1.11 1.06  CALCIUM 9.7 9.2 8.6   Liver Function Tests:  Recent Labs Lab 07/07/14 1930  AST 14  ALT 6  ALKPHOS 51  BILITOT 0.6  PROT 6.9  ALBUMIN 3.4*   No results for input(s): LIPASE, AMYLASE in the last 168 hours. No results for input(s): AMMONIA in the last 168  hours. CBC:  Recent Labs Lab 07/04/14 1525 07/07/14 1930 07/08/14 0635  WBC 11.3* 7.6 6.9  NEUTROABS 9.5* 5.3  --   HGB 12.0* 12.0* 11.6*  HCT 37.4* 36.5* 35.1*  MCV 69.1* 69.0* 68.8*  PLT 236 241 231   Cardiac Enzymes:  Recent Labs Lab 07/07/14 1730 07/07/14 1930  TROPONINI 0.33* <0.30   BNP (last 3 results)  Recent Labs  06/25/14 1325 07/04/14 1525  PROBNP 27.9 36.7   CBG: No results for input(s): GLUCAP in the last 168 hours.  No results found for this or any previous visit (from the past 240 hour(s)).   Studies: Dg Chest 2 View  07/07/2014   CLINICAL DATA:  Weakness and dizziness for the past 3 days.  EXAM: CHEST  2 VIEW  COMPARISON:  07/04/2014.  FINDINGS: Normal sized heart. Clear lungs with normal vascularity. The lungs remain hyperexpanded. Mild thoracic spine degenerative changes and mild scoliosis.  IMPRESSION: No acute abnormality.  Stable changes of COPD.   Electronically Signed   By: Enrique Sack M.D.   On: 07/07/2014 17:43    Scheduled Meds: . aspirin EC  81 mg Oral Daily  . enoxaparin (LOVENOX) injection  40 mg Subcutaneous Q24H  . sodium chloride  3 mL Intravenous Q12H   Continuous Infusions:     Time spent: > 35 minutes    Velvet Bathe  [facility] Pager [phone] If 7PM-7AM, please contact night-coverage  at [email or web address], password [facility] 07/09/2014, 2:45 PM  LOS: 2 days

## 2014-07-09 NOTE — Progress Notes (Signed)
We received a request for a PT consult.  Pt was visited this morning and he was interviewed.  He continues to experience vertigo in the standing position and a recent BP by RN indicates continued orthostasis.  His symptoms do not significantly increase with any other change of position nor with eyes closed.  I do not think that there is a vestibular component here.  He is functionally independent and has no problems with mobility.  He is wearing TED stockings but states that they don't really help.  I don' think that there is much I can offer.  Please reconsult if you have any other concerns.  Thanks.

## 2014-07-10 DIAGNOSIS — M545 Low back pain, unspecified: Secondary | ICD-10-CM | POA: Diagnosis present

## 2014-07-10 LAB — URINE CULTURE

## 2014-07-10 MED ORDER — MIDODRINE HCL 5 MG PO TABS
10.0000 mg | ORAL_TABLET | Freq: Three times a day (TID) | ORAL | Status: DC
  Administered 2014-07-10 – 2014-07-17 (×22): 10 mg via ORAL
  Filled 2014-07-10 (×24): qty 2

## 2014-07-10 NOTE — Plan of Care (Signed)
Problem: Phase III Progression Outcomes Goal: Foley discontinued Outcome: Not Applicable Date Met:  97/84/78

## 2014-07-10 NOTE — Plan of Care (Signed)
Problem: Phase III Progression Outcomes Goal: IV/normal saline lock discontinued Outcome: Completed/Met Date Met:  07/10/14     

## 2014-07-10 NOTE — Progress Notes (Signed)
[facility] PROGRESS NOTE  Erik [name] [id number]:[id number] DOB: [birth year] DOA: 07/07/2014 PCP: PROVIDER NOT IN SYSTEM  Assessment/Plan: 1. Orthostatic hypotension: etiology unclear. No s/sx infectious process. No hx diarrhea or vomiting. Troponin neg x2. No events on tele. Evaluated by PT who opine doubtful vestibular component. Recent echo with EF 60% and grade 1 diastolic dysfunction. Reports symptoms began when started on Ultram for low back pain. Will hold for now. Start midodrine.  2. Low back pain: chronic. No focal deficits. No hx trauma. Xray Lumbar spine 07/04/14 with no acute abnormality.  Will provide pain med judiciously given #1. Request PT evaluation. May benefit OP MRI if no improvement  Monitor.    Code Status: full Family Communication: none present Disposition Plan: home hopefully tomorrow   Consultants:  none  Procedures:  none  Antibiotics:  none  HPI/Subjective: Alert reports feeling better today.   Objective: Filed Vitals:   07/10/14 0518  BP:   Pulse:   Temp: 98.4 F (36.9 C)  Resp:     Intake/Output Summary (Last 24 hours) at 07/10/14 1256 Last data filed at 07/10/14 1000  Gross per 24 hour  Intake    720 ml  Output      0 ml  Net    720 ml   Filed Weights   07/07/14 1516  Weight: 86.183 kg (190 lb)    Exam:   General:  Somewhat thin appears comfortable  Cardiovascular: RRR No MGR No LE edema  Respiratory: normal effort BS clear bilaterally no wheezse  Abdomen: soft +BS non-tender to palpation  Musculoskeletal: no clubbing or cyanosis   Data Reviewed: Basic Metabolic Panel:  Recent Labs Lab 07/04/14 1525 07/07/14 1930 07/08/14 0635  NA 136* 139 140  K 4.6 4.7 4.2  CL 95* 100 103  CO2 27 28 26   GLUCOSE 104* 92 83  BUN 15 11 10   CREATININE 1.40* 1.11 1.06  CALCIUM 9.7 9.2 8.6   Liver Function Tests:  Recent Labs Lab 07/07/14 1930  AST 14  ALT 6  ALKPHOS 51  BILITOT 0.6  PROT 6.9  ALBUMIN 3.4*    No results for input(s): LIPASE, AMYLASE in the last 168 hours. No results for input(s): AMMONIA in the last 168 hours. CBC:  Recent Labs Lab 07/04/14 1525 07/07/14 1930 07/08/14 0635  WBC 11.3* 7.6 6.9  NEUTROABS 9.5* 5.3  --   HGB 12.0* 12.0* 11.6*  HCT 37.4* 36.5* 35.1*  MCV 69.1* 69.0* 68.8*  PLT 236 241 231   Cardiac Enzymes:  Recent Labs Lab 07/07/14 1730 07/07/14 1930  TROPONINI 0.33* <0.30   BNP (last 3 results)  Recent Labs  06/25/14 1325 07/04/14 1525  PROBNP 27.9 36.7   CBG: No results for input(s): GLUCAP in the last 168 hours.  Recent Results (from the past 240 hour(s))  Urine culture     Status: None   Collection Time: 07/07/14  5:45 PM  Result Value Ref Range Status   Specimen Description URINE, CLEAN CATCH  Final   Special Requests NONE  Final   Culture  Setup Time   Final    07/08/2014 21:38 Performed at [place]   Final    20,OOO COLONIES/ML Performed at [facility]    Culture   Final    Multiple bacterial morphotypes present, none predominant. Suggest appropriate recollection if clinically indicated. Performed at [facility]    Report Status 07/10/2014 FINAL  Final  Studies: No results found.  Scheduled Meds: . aspirin EC  81 mg Oral Daily  . enoxaparin (LOVENOX) injection  40 mg Subcutaneous Q24H  . midodrine  10 mg Oral TID WC  . sodium chloride  3 mL Intravenous Q12H   Continuous Infusions:   Principal Problem:   Orthostatic hypotension Active Problems:   Orthostasis   Low back pain    Time spent: 35 mintutes    [facility] Pager [phone]. If 7PM-7AM, please contact night-coverage at [email or web address], password [facility] 07/10/2014, 12:56 PM  LOS: 3 days     Patient seen and examined independently. Agree with above plan. Added Midodrine. Recommend reassessing orthostatic blood pressures next a.m. and continued physical therapy.

## 2014-07-11 NOTE — Progress Notes (Signed)
[facility] PROGRESS NOTE  Erik [name] GXQ:[id number] DOB: [birth year] DOA: 07/07/2014 PCP: PROVIDER NOT IN SYSTEM  Assessment/Plan: Orthostatic Hypotension -Quite impressive orthostatic changes and he becomes quite symptomatic. -Check TSH/cortisol/HIV. -Midodrine started. -TED hose in place. -Will consider florinef if no improvement by am.  Code Status: Full Code Family Communication: Patient only  Disposition Plan: Home in 2-3 days.   Consultants:  None   Antibiotics:  None   Subjective: Still dizzy upon standing  Objective: Filed Vitals:   07/10/14 2155 07/11/14 0448 07/11/14 0510 07/11/14 1421  BP:   111/62 124/74  Pulse:  71  84  Temp: 98.5 F (36.9 C) 98.1 F (36.7 C)  99.3 F (37.4 C)  TempSrc: Oral Oral  Oral  Resp: 16 18  18   Height:      Weight:      SpO2: 94% 98%  99%    Intake/Output Summary (Last 24 hours) at 07/11/14 1819 Last data filed at 07/11/14 1422  Gross per 24 hour  Intake    483 ml  Output   1725 ml  Net  -1242 ml   Filed Weights   07/07/14 1516  Weight: 86.183 kg (190 lb)    Exam:   General:  AA Ox3  Cardiovascular: RRR  Respiratory: CTA B  Abdomen: S/NT/ND/+BS  Extremities: no C/C/E/TED hose on   Neurologic:  Non-focal  Data Reviewed: Basic Metabolic Panel:  Recent Labs Lab 07/07/14 1930 07/08/14 0635  NA 139 140  K 4.7 4.2  CL 100 103  CO2 28 26  GLUCOSE 92 83  BUN 11 10  CREATININE 1.11 1.06  CALCIUM 9.2 8.6   Liver Function Tests:  Recent Labs Lab 07/07/14 1930  AST 14  ALT 6  ALKPHOS 51  BILITOT 0.6  PROT 6.9  ALBUMIN 3.4*   No results for input(s): LIPASE, AMYLASE in the last 168 hours. No results for input(s): AMMONIA in the last 168 hours. CBC:  Recent Labs Lab 07/07/14 1930 07/08/14 0635  WBC 7.6 6.9  NEUTROABS 5.3  --   HGB 12.0* 11.6*  HCT 36.5* 35.1*  MCV 69.0* 68.8*  PLT 241 231   Cardiac Enzymes:  Recent Labs Lab 07/07/14 1730 07/07/14 1930    TROPONINI 0.33* <0.30   BNP (last 3 results)  Recent Labs  06/25/14 1325 07/04/14 1525  PROBNP 27.9 36.7   CBG: No results for input(s): GLUCAP in the last 168 hours.  Recent Results (from the past 240 hour(s))  Urine culture     Status: None   Collection Time: 07/07/14  5:45 PM  Result Value Ref Range Status   Specimen Description URINE, CLEAN CATCH  Final   Special Requests NONE  Final   Culture  Setup Time   Final    07/08/2014 21:38 Performed at [place]   Final    20,OOO COLONIES/ML Performed at [facility]    Culture   Final    Multiple bacterial morphotypes present, none predominant. Suggest appropriate recollection if clinically indicated. Performed at [facility]    Report Status 07/10/2014 FINAL  Final     Studies: No results found.  Scheduled Meds: . aspirin EC  81 mg Oral Daily  . enoxaparin (LOVENOX) injection  40 mg Subcutaneous Q24H  . midodrine  10 mg Oral TID WC  . sodium chloride  3 mL Intravenous Q12H   Continuous Infusions:   Principal Problem:   Orthostatic hypotension Active  Problems:   Orthostasis   Low back pain    Time spent: 25 minutes. Greater than 50% of this time was spent in direct contact with the patient coordinating care.    Lelon Frohlich  [facility] Pager [phone]  If 7PM-7AM, please contact night-coverage at [email or web address], password [facility] 07/11/2014, 6:19 PM  LOS: 4 days

## 2014-07-11 NOTE — Plan of Care (Signed)
Problem: Discharge Progression Outcomes Goal: Pain controlled with appropriate interventions Outcome: Completed/Met Date Met:  07/11/14     

## 2014-07-12 LAB — CBC
HCT: 37.3 % — ABNORMAL LOW (ref 39.0–52.0)
Hemoglobin: 12.3 g/dL — ABNORMAL LOW (ref 13.0–17.0)
MCH: 22.7 pg — ABNORMAL LOW (ref 26.0–34.0)
MCHC: 33 g/dL (ref 30.0–36.0)
MCV: 68.9 fL — AB (ref 78.0–100.0)
Platelets: 262 10*3/uL (ref 150–400)
RBC: 5.41 MIL/uL (ref 4.22–5.81)
RDW: 15.2 % (ref 11.5–15.5)
WBC: 8.8 10*3/uL (ref 4.0–10.5)

## 2014-07-12 LAB — BASIC METABOLIC PANEL
Anion gap: 9 (ref 5–15)
BUN: 9 mg/dL (ref 6–23)
CHLORIDE: 103 meq/L (ref 96–112)
CO2: 29 mEq/L (ref 19–32)
Calcium: 9 mg/dL (ref 8.4–10.5)
Creatinine, Ser: 1.18 mg/dL (ref 0.50–1.35)
GFR calc Af Amer: 71 mL/min — ABNORMAL LOW (ref >90)
GFR calc non Af Amer: 61 mL/min — ABNORMAL LOW (ref >90)
GLUCOSE: 99 mg/dL (ref 70–99)
POTASSIUM: 4.3 meq/L (ref 3.7–5.3)
Sodium: 141 mEq/L (ref 137–147)

## 2014-07-12 LAB — CORTISOL: Cortisol, Plasma: 13.8 ug/dL

## 2014-07-12 LAB — HIV ANTIBODY (ROUTINE TESTING W REFLEX): HIV 1&2 Ab, 4th Generation: NONREACTIVE

## 2014-07-12 LAB — TSH: TSH: 0.733 u[IU]/mL (ref 0.350–4.500)

## 2014-07-12 MED ORDER — FLUDROCORTISONE ACETATE 0.1 MG PO TABS
0.1000 mg | ORAL_TABLET | Freq: Two times a day (BID) | ORAL | Status: DC
  Administered 2014-07-12 (×2): 0.1 mg via ORAL
  Filled 2014-07-12 (×9): qty 1

## 2014-07-12 NOTE — Progress Notes (Signed)
[facility] PROGRESS NOTE  Erik [name] ZMO:[id number] DOB: [birth year] DOA: 07/07/2014 PCP: PROVIDER NOT IN SYSTEM  Assessment/Plan: Orthostatic Hypotension -Quite impressive orthostatic changes. -Less symptomatic today. -Have started florinef and is also on midodrine. - TSH/cortisol/HIV WNL. -TED hose in place. -Continue mobilization.  Code Status: Full Code Family Communication: Discussed with daughter [name] via phone 12/3. Disposition Plan: Anticipate DC home in 1 -2 days   Consultants:  None   Antibiotics:  None   Subjective: Still dizzy upon standing, but improving.  Objective: Filed Vitals:   07/11/14 1421 07/11/14 2109 07/12/14 0500 07/12/14 1231  BP: 124/74   138/80  Pulse: 84   68  Temp: 99.3 F (37.4 C) 98.9 F (37.2 C) 97.7 F (36.5 C) 98.1 F (36.7 C)  TempSrc: Oral Oral Oral Oral  Resp: 18 20 18 18   Height:      Weight:      SpO2: 99% 98% 100% 100%    Intake/Output Summary (Last 24 hours) at 07/12/14 1539 Last data filed at 07/12/14 1230  Gross per 24 hour  Intake    720 ml  Output   1600 ml  Net   -880 ml   Filed Weights   07/07/14 1516  Weight: 86.183 kg (190 lb)    Exam:   General:  AA Ox3  Cardiovascular: RRR  Respiratory: CTA B  Abdomen: S/NT/ND/+BS  Extremities: no C/C/E/TED hose on   Neurologic:  Non-focal  Data Reviewed: Basic Metabolic Panel:  Recent Labs Lab 07/07/14 1930 07/08/14 0635 07/12/14 0540  NA 139 140 141  K 4.7 4.2 4.3  CL 100 103 103  CO2 28 26 29   GLUCOSE 92 83 99  BUN 11 10 9   CREATININE 1.11 1.06 1.18  CALCIUM 9.2 8.6 9.0   Liver Function Tests:  Recent Labs Lab 07/07/14 1930  AST 14  ALT 6  ALKPHOS 51  BILITOT 0.6  PROT 6.9  ALBUMIN 3.4*   No results for input(s): LIPASE, AMYLASE in the last 168 hours. No results for input(s): AMMONIA in the last 168 hours. CBC:  Recent Labs Lab 07/07/14 1930 07/08/14 0635 07/12/14 0540  WBC 7.6 6.9 8.8    NEUTROABS 5.3  --   --   HGB 12.0* 11.6* 12.3*  HCT 36.5* 35.1* 37.3*  MCV 69.0* 68.8* 68.9*  PLT 241 231 262   Cardiac Enzymes:  Recent Labs Lab 07/07/14 1730 07/07/14 1930  TROPONINI 0.33* <0.30   BNP (last 3 results)  Recent Labs  06/25/14 1325 07/04/14 1525  PROBNP 27.9 36.7   CBG: No results for input(s): GLUCAP in the last 168 hours.  Recent Results (from the past 240 hour(s))  Urine culture     Status: None   Collection Time: 07/07/14  5:45 PM  Result Value Ref Range Status   Specimen Description URINE, CLEAN CATCH  Final   Special Requests NONE  Final   Culture  Setup Time   Final    07/08/2014 21:38 Performed at [place]   Final    20,OOO COLONIES/ML Performed at [facility]    Culture   Final    Multiple bacterial morphotypes present, none predominant. Suggest appropriate recollection if clinically indicated. Performed at [facility]    Report Status 07/10/2014 FINAL  Final     Studies: No results found.  Scheduled Meds: . aspirin EC  81 mg Oral Daily  . enoxaparin (LOVENOX) injection  40 mg Subcutaneous  Q24H  . fludrocortisone  0.1 mg Oral BID  . midodrine  10 mg Oral TID WC  . sodium chloride  3 mL Intravenous Q12H   Continuous Infusions:   Principal Problem:   Orthostatic hypotension Active Problems:   Orthostasis   Low back pain    Time spent: 35 minutes. Greater than 50% of this time was spent in direct contact with the patient coordinating care.    Lelon Frohlich  [facility] Pager [phone]  If 7PM-7AM, please contact night-coverage at [email or web address], password [facility] 07/12/2014, 3:39 PM  LOS: 5 days

## 2014-07-12 NOTE — Plan of Care (Signed)
Problem: Discharge Progression Outcomes Goal: Hemodynamically stable Outcome: Not Progressing Pt still experiencing orthostatic hypotension Goal: Tolerating diet Outcome: Completed/Met Date Met:  07/12/14 Goal: Activity appropriate for discharge plan Outcome: Progressing

## 2014-07-13 ENCOUNTER — Encounter: Payer: Medicare Other | Admitting: Cardiovascular Disease

## 2014-07-13 MED ORDER — FLUDROCORTISONE ACETATE 0.1 MG PO TABS
0.2000 mg | ORAL_TABLET | Freq: Two times a day (BID) | ORAL | Status: DC
Start: 2014-07-13 — End: 2014-07-17
  Administered 2014-07-13 – 2014-07-17 (×8): 0.2 mg via ORAL
  Filled 2014-07-13 (×10): qty 2

## 2014-07-13 NOTE — Progress Notes (Signed)
Orthostatic vitals this morning standing 49/24, lying 129/69, sit 94/56. Pulse remained 79 & higher. MD notified.

## 2014-07-13 NOTE — Progress Notes (Signed)
[facility] PROGRESS NOTE  Erik [name] SNK:[id number] DOB: 19-Aug-1945 DOA: 07/07/2014 PCP: PROVIDER NOT IN SYSTEM  Assessment/Plan: Orthostatic Hypotension -Quite impressive orthostatic changes. -Dizzy again today upon standing. -Has been started on midodrine. -Florinef started 12/3; will increase dose to 0.2 mg twice a day today. - TSH/cortisol/HIV WNL. -TED hose in place. -Continue mobilization. -Have educated extensively on physical measures to help prevent falling from orthostasis.  Code Status: Full Code Family Communication: Discussed with daughter [name] via phone 12/3. Disposition Plan: Anticipate DC home in 1 -2 days   Consultants:  None   Antibiotics:  None   Subjective: Still dizzy upon standing, but improving.  Objective: Filed Vitals:   07/12/14 2259 07/13/14 0600 07/13/14 0743 07/13/14 1435  BP: 119/65   124/62  Pulse: 66 78  65  Temp: 98.4 F (36.9 C) 98.1 F (36.7 C)  98 F (36.7 C)  TempSrc: Oral Oral  Oral  Resp: 16 15  18   Height:      Weight:      SpO2: 98% 99% 100% 99%    Intake/Output Summary (Last 24 hours) at 07/13/14 1700 Last data filed at 07/13/14 1330  Gross per 24 hour  Intake    480 ml  Output   1025 ml  Net   -545 ml   Filed Weights   07/07/14 1516  Weight: 86.183 kg (190 lb)    Exam:   General:  AA Ox3  Cardiovascular: RRR  Respiratory: CTA B  Abdomen: S/NT/ND/+BS  Extremities: no C/C/E/TED hose on   Neurologic:  Non-focal  Data Reviewed: Basic Metabolic Panel:  Recent Labs Lab 07/07/14 1930 07/08/14 0635 07/12/14 0540  NA 139 140 141  K 4.7 4.2 4.3  CL 100 103 103  CO2 28 26 29   GLUCOSE 92 83 99  BUN 11 10 9   CREATININE 1.11 1.06 1.18  CALCIUM 9.2 8.6 9.0   Liver Function Tests:  Recent Labs Lab 07/07/14 1930  AST 14  ALT 6  ALKPHOS 51  BILITOT 0.6  PROT 6.9  ALBUMIN 3.4*   No results for input(s): LIPASE, AMYLASE in the last 168 hours. No results for  input(s): AMMONIA in the last 168 hours. CBC:  Recent Labs Lab 07/07/14 1930 07/08/14 0635 07/12/14 0540  WBC 7.6 6.9 8.8  NEUTROABS 5.3  --   --   HGB 12.0* 11.6* 12.3*  HCT 36.5* 35.1* 37.3*  MCV 69.0* 68.8* 68.9*  PLT 241 231 262   Cardiac Enzymes:  Recent Labs Lab 07/07/14 1730 07/07/14 1930  TROPONINI 0.33* <0.30   BNP (last 3 results)  Recent Labs  06/25/14 1325 07/04/14 1525  PROBNP 27.9 36.7   CBG: No results for input(s): GLUCAP in the last 168 hours.  Recent Results (from the past 240 hour(s))  Urine culture     Status: None   Collection Time: 07/07/14  5:45 PM  Result Value Ref Range Status   Specimen Description URINE, CLEAN CATCH  Final   Special Requests NONE  Final   Culture  Setup Time   Final    07/08/2014 21:38 Performed at [place]   Final    20,OOO COLONIES/ML Performed at [facility]    Culture   Final    Multiple bacterial morphotypes present, none predominant. Suggest appropriate recollection if clinically indicated. Performed at [facility]    Report Status 07/10/2014 FINAL  Final     Studies: No results found.  Scheduled Meds: . aspirin EC  81 mg Oral Daily  . enoxaparin (LOVENOX) injection  40 mg Subcutaneous Q24H  . fludrocortisone  0.2 mg Oral BID  . midodrine  10 mg Oral TID WC  . sodium chloride  3 mL Intravenous Q12H   Continuous Infusions:   Principal Problem:   Orthostatic hypotension Active Problems:   Orthostasis   Low back pain    Time spent: 25 minutes. Greater than 50% of this time was spent in direct contact with the patient coordinating care.    Lelon Frohlich  [facility] Pager [phone]  If 7PM-7AM, please contact night-coverage at [email or web address], password [facility] 07/13/2014, 5:00 PM  LOS: 6 days

## 2014-07-13 NOTE — Progress Notes (Signed)
Pt potential discharge later today/tomorrow. No CM needs noted. IM given by Vladimir Creeks, RN CM.

## 2014-07-14 NOTE — Progress Notes (Signed)
[facility] PROGRESS NOTE  Erik [name] JQB:[id number] DOB: 1945-09-27 DOA: 07/07/2014 PCP: PROVIDER NOT IN SYSTEM  Assessment/Plan: Orthostatic Hypotension -Quite impressive orthostatic changes. -Dizzy again today upon standing. -Has been started on midodrine. -Florinef dose increased 12/4. - TSH/cortisol/HIV WNL. -TED hose in place. -Continue mobilization. -Have educated extensively on physical measures to help prevent falling from orthostasis.  Code Status: Full Code Family Communication: Discussed with daughter Erik [name] via phone 12/3. Disposition Plan: Anticipate DC home in 1 -2 days   Consultants:  None   Antibiotics:  None   Subjective: Still dizzy upon standing, but improving.  Objective: Filed Vitals:   07/13/14 1435 07/13/14 2056 07/14/14 0704 07/14/14 1459  BP: 124/62 136/70  121/60  Pulse: 65 58  78  Temp: 98 F (36.7 C) 98.3 F (36.8 C) 98.3 F (36.8 C) 97.8 F (36.6 C)  TempSrc: Oral Oral Oral   Resp: 18 20 20 20   Height:      Weight:      SpO2: 99% 99% 99% 97%    Intake/Output Summary (Last 24 hours) at 07/14/14 1610 Last data filed at 07/14/14 1300  Gross per 24 hour  Intake    480 ml  Output   1000 ml  Net   -520 ml   Filed Weights   07/07/14 1516  Weight: 86.183 kg (190 lb)    Exam:   General:  AA Ox3  Cardiovascular: RRR  Respiratory: CTA B  Abdomen: S/NT/ND/+BS  Extremities: no C/C/E/TED hose on   Neurologic:  Non-focal  Data Reviewed: Basic Metabolic Panel:  Recent Labs Lab 07/07/14 1930 07/08/14 0635 07/12/14 0540  NA 139 140 141  K 4.7 4.2 4.3  CL 100 103 103  CO2 28 26 29   GLUCOSE 92 83 99  BUN 11 10 9   CREATININE 1.11 1.06 1.18  CALCIUM 9.2 8.6 9.0   Liver Function Tests:  Recent Labs Lab 07/07/14 1930  AST 14  ALT 6  ALKPHOS 51  BILITOT 0.6  PROT 6.9  ALBUMIN 3.4*   No results for input(s): LIPASE, AMYLASE in the last 168 hours. No results for input(s): AMMONIA in the  last 168 hours. CBC:  Recent Labs Lab 07/07/14 1930 07/08/14 0635 07/12/14 0540  WBC 7.6 6.9 8.8  NEUTROABS 5.3  --   --   HGB 12.0* 11.6* 12.3*  HCT 36.5* 35.1* 37.3*  MCV 69.0* 68.8* 68.9*  PLT 241 231 262   Cardiac Enzymes:  Recent Labs Lab 07/07/14 1730 07/07/14 1930  TROPONINI 0.33* <0.30   BNP (last 3 results)  Recent Labs  06/25/14 1325 07/04/14 1525  PROBNP 27.9 36.7   CBG: No results for input(s): GLUCAP in the last 168 hours.  Recent Results (from the past 240 hour(s))  Urine culture     Status: None   Collection Time: 07/07/14  5:45 PM  Result Value Ref Range Status   Specimen Description URINE, CLEAN CATCH  Final   Special Requests NONE  Final   Culture  Setup Time   Final    07/08/2014 21:38 Performed at [place]   Final    20,OOO COLONIES/ML Performed at [facility]    Culture   Final    Multiple bacterial morphotypes present, none predominant. Suggest appropriate recollection if clinically indicated. Performed at [facility]    Report Status 07/10/2014 FINAL  Final     Studies: No results found.  Scheduled Meds: . aspirin EC  81 mg Oral Daily  . enoxaparin (LOVENOX) injection  40 mg Subcutaneous Q24H  . fludrocortisone  0.2 mg Oral BID  . midodrine  10 mg Oral TID WC  . sodium chloride  3 mL Intravenous Q12H   Continuous Infusions:   Principal Problem:   Orthostatic hypotension Active Problems:   Orthostasis   Low back pain    Time spent: 25 minutes. Greater than 50% of this time was spent in direct contact with the patient coordinating care.    Lelon Frohlich  [facility] Pager [phone]  If 7PM-7AM, please contact night-coverage at [email or web address], password [facility] 07/14/2014, 4:10 PM  LOS: 7 days

## 2014-07-15 NOTE — Progress Notes (Signed)
[facility] PROGRESS NOTE  Erik [name]:[id number] DOB: 1946/07/27 DOA: 07/07/2014 PCP: PROVIDER NOT IN SYSTEM  Assessment/Plan: Orthostatic Hypotension -Quite impressive orthostatic changes, still. -Starting to consider etiologies such as primary autonomic neuropathy (?Shy-Drager). -Will request neurology input for further recommendations. -Dizzy again today upon standing. -Has been started on midodrine. -Florinef dose increased 12/4. - TSH/cortisol/HIV WNL. -TED hose in place. -Continue mobilization. -Have educated extensively on physical measures to help prevent falling from orthostasis.  Code Status: Full Code Family Communication: Discussed with daughter [name] via phone 12/3. Disposition Plan: Anticipate DC home in 1 -2 days   Consultants:  None   Antibiotics:  None   Subjective: Still dizzy upon standing, but improving.  Objective: Filed Vitals:   07/14/14 1459 07/14/14 2133 07/15/14 0616 07/15/14 1327  BP: 121/60 134/64 123/63 112/54  Pulse: 78 58 63 74  Temp: 97.8 F (36.6 C) 98.1 F (36.7 C) 98.3 F (36.8 C) 98.2 F (36.8 C)  TempSrc:  Oral Oral   Resp: 20 20 20 20   Height:      Weight:      SpO2: 97% 99%  98%    Intake/Output Summary (Last 24 hours) at 07/15/14 1406 Last data filed at 07/15/14 1327  Gross per 24 hour  Intake    720 ml  Output    600 ml  Net    120 ml   Filed Weights   07/07/14 1516  Weight: 86.183 kg (190 lb)    Exam:   General:  AA Ox3  Cardiovascular: RRR  Respiratory: CTA B  Abdomen: S/NT/ND/+BS  Extremities: no C/C/E/TED hose on   Neurologic:  Non-focal  Data Reviewed: Basic Metabolic Panel:  Recent Labs Lab 07/12/14 0540  NA 141  K 4.3  CL 103  CO2 29  GLUCOSE 99  BUN 9  CREATININE 1.18  CALCIUM 9.0   Liver Function Tests: No results for input(s): AST, ALT, ALKPHOS, BILITOT, PROT, ALBUMIN in the last 168 hours. No results for input(s): LIPASE, AMYLASE in the last 168  hours. No results for input(s): AMMONIA in the last 168 hours. CBC:  Recent Labs Lab 07/12/14 0540  WBC 8.8  HGB 12.3*  HCT 37.3*  MCV 68.9*  PLT 262   Cardiac Enzymes: No results for input(s): CKTOTAL, CKMB, CKMBINDEX, TROPONINI in the last 168 hours. BNP (last 3 results)  Recent Labs  06/25/14 1325 07/04/14 1525  PROBNP 27.9 36.7   CBG: No results for input(s): GLUCAP in the last 168 hours.  Recent Results (from the past 240 hour(s))  Urine culture     Status: None   Collection Time: 07/07/14  5:45 PM  Result Value Ref Range Status   Specimen Description URINE, CLEAN CATCH  Final   Special Requests NONE  Final   Culture  Setup Time   Final    07/08/2014 21:38 Performed at [facility]   Final    20,OOO COLONIES/ML Performed at [facility]    Culture   Final    Multiple bacterial morphotypes present, none predominant. Suggest appropriate recollection if clinically indicated. Performed at [facility]    Report Status 07/10/2014 FINAL  Final     Studies: No results found.  Scheduled Meds: . aspirin EC  81 mg Oral Daily  . enoxaparin (LOVENOX) injection  40 mg Subcutaneous Q24H  . fludrocortisone  0.2 mg Oral BID  . midodrine  10 mg Oral TID WC  . sodium  chloride  3 mL Intravenous Q12H   Continuous Infusions:   Principal Problem:   Orthostatic hypotension Active Problems:   Orthostasis   Low back pain    Time spent: 25 minutes. Greater than 50% of this time was spent in direct contact with the patient coordinating care.    Lelon Frohlich  [facility] Pager [phone]  If 7PM-7AM, please contact night-coverage at [email or web address], password [facility] 07/15/2014, 2:06 PM  LOS: 8 days

## 2014-07-15 NOTE — Progress Notes (Signed)
Notified Dr. Jerilee Hoh of the patients orthostatic BP being abnormal this am.

## 2014-07-16 LAB — CBC
HEMATOCRIT: 36.4 % — AB (ref 39.0–52.0)
Hemoglobin: 12.2 g/dL — ABNORMAL LOW (ref 13.0–17.0)
MCH: 23.3 pg — ABNORMAL LOW (ref 26.0–34.0)
MCHC: 33.5 g/dL (ref 30.0–36.0)
MCV: 69.5 fL — AB (ref 78.0–100.0)
Platelets: 272 10*3/uL (ref 150–400)
RBC: 5.24 MIL/uL (ref 4.22–5.81)
RDW: 15.1 % (ref 11.5–15.5)
WBC: 8.8 10*3/uL (ref 4.0–10.5)

## 2014-07-16 LAB — BASIC METABOLIC PANEL
Anion gap: 11 (ref 5–15)
BUN: 11 mg/dL (ref 6–23)
CO2: 29 meq/L (ref 19–32)
Calcium: 9 mg/dL (ref 8.4–10.5)
Chloride: 102 mEq/L (ref 96–112)
Creatinine, Ser: 1.09 mg/dL (ref 0.50–1.35)
GFR calc Af Amer: 78 mL/min — ABNORMAL LOW (ref >90)
GFR calc non Af Amer: 67 mL/min — ABNORMAL LOW (ref >90)
Glucose, Bld: 96 mg/dL (ref 70–99)
Potassium: 4.2 mEq/L (ref 3.7–5.3)
Sodium: 142 mEq/L (ref 137–147)

## 2014-07-16 NOTE — Progress Notes (Signed)
[facility] PROGRESS NOTE  RITHWIK SCHMIEG MGN:[id number] DOB: February 02, 1946 DOA: 07/07/2014 PCP: PROVIDER NOT IN SYSTEM  Assessment/Plan: Orthostatic Hypotension -Quite impressive orthostatic changes, still. -Starting to consider etiologies such as primary autonomic neuropathy (?Shy-Drager). -Will request neurology input for further recommendations. -Dizzy again today upon standing. -Has been started on midodrine. -Florinef dose increased 12/4. - TSH/cortisol/HIV WNL. -TED hose in place. -Continue mobilization. -Have educated extensively on physical measures to help prevent falling from orthostasis.  Code Status: Full Code Family Communication: Discussed with daughter [name] via phone 12/3. Disposition Plan: Anticipate DC home in 1 -2 days   Consultants:  None   Antibiotics:  None   Subjective: Still dizzy upon standing, but improving.  Objective: Filed Vitals:   07/15/14 1327 07/15/14 2215 07/16/14 0620 07/16/14 1230  BP: 112/54 115/59 131/68 130/88  Pulse: 74 71 68 63  Temp: 98.2 F (36.8 C) 98.4 F (36.9 C) 98.1 F (36.7 C) 98.5 F (36.9 C)  TempSrc:  Oral Oral Oral  Resp: 20 20 20 19   Height:      Weight:      SpO2: 98% 99%  100%    Intake/Output Summary (Last 24 hours) at 07/16/14 1534 Last data filed at 07/16/14 0914  Gross per 24 hour  Intake    480 ml  Output    701 ml  Net   -221 ml   Filed Weights   07/07/14 1516  Weight: 86.183 kg (190 lb)    Exam:   General:  AA Ox3  Cardiovascular: RRR  Respiratory: CTA B  Abdomen: S/NT/ND/+BS  Extremities: no C/C/E/TED hose on   Neurologic:  Non-focal  Data Reviewed: Basic Metabolic Panel:  Recent Labs Lab 07/12/14 0540 07/16/14 0604  NA 141 142  K 4.3 4.2  CL 103 102  CO2 29 29  GLUCOSE 99 96  BUN 9 11  CREATININE 1.18 1.09  CALCIUM 9.0 9.0   Liver Function Tests: No results for input(s): AST, ALT, ALKPHOS, BILITOT, PROT, ALBUMIN in the last 168 hours. No  results for input(s): LIPASE, AMYLASE in the last 168 hours. No results for input(s): AMMONIA in the last 168 hours. CBC:  Recent Labs Lab 07/12/14 0540 07/16/14 0604  WBC 8.8 8.8  HGB 12.3* 12.2*  HCT 37.3* 36.4*  MCV 68.9* 69.5*  PLT 262 272   Cardiac Enzymes: No results for input(s): CKTOTAL, CKMB, CKMBINDEX, TROPONINI in the last 168 hours. BNP (last 3 results)  Recent Labs  06/25/14 1325 07/04/14 1525  PROBNP 27.9 36.7   CBG: No results for input(s): GLUCAP in the last 168 hours.  Recent Results (from the past 240 hour(s))  Urine culture     Status: None   Collection Time: 07/07/14  5:45 PM  Result Value Ref Range Status   Specimen Description URINE, CLEAN CATCH  Final   Special Requests NONE  Final   Culture  Setup Time   Final    07/08/2014 21:38 Performed at [place]   Final    20,OOO COLONIES/ML Performed at [facility]    Culture   Final    Multiple bacterial morphotypes present, none predominant. Suggest appropriate recollection if clinically indicated. Performed at [facility]    Report Status 07/10/2014 FINAL  Final     Studies: No results found.  Scheduled Meds: . aspirin EC  81 mg Oral Daily  . enoxaparin (LOVENOX) injection  40 mg Subcutaneous Q24H  . fludrocortisone  0.2 mg Oral BID  . midodrine  10 mg Oral TID WC  . sodium chloride  3 mL Intravenous Q12H   Continuous Infusions:   Principal Problem:   Orthostatic hypotension Active Problems:   Orthostasis   Low back pain    Time spent: 25 minutes. Greater than 50% of this time was spent in direct contact with the patient coordinating care.    Lelon Frohlich  [facility] Pager [phone]  If 7PM-7AM, please contact night-coverage at [email or web address], password [facility] 07/16/2014, 3:34 PM  LOS: 9 days

## 2014-07-16 NOTE — Progress Notes (Signed)
Nutrition Brief Note  RD drawn to patient due to length of stay. His weight is stable and appetite is good despite his orthostatic hypotension.  Wt Readings from Last 15 Encounters:  07/07/14 190 lb (86.183 kg)  07/04/14 190 lb (86.183 kg)  05/11/13 190 lb (86.183 kg)    Body mass index is 23.75 kg/(m^2). Patient meets criteria for normal based on current BMI.   Current diet order is regular, patient is consuming approximately 100% of most meals at this time. Labs and medications reviewed.   No nutrition interventions warranted at this time. If nutrition issues arise, please consult RD.   Colman Cater MS,RD,CSG,LDN Office: [phone] Pager: [phone]

## 2014-07-17 ENCOUNTER — Inpatient Hospital Stay (HOSPITAL_COMMUNITY): Payer: Medicare Other

## 2014-07-17 DIAGNOSIS — M5441 Lumbago with sciatica, right side: Secondary | ICD-10-CM

## 2014-07-17 LAB — C-REACTIVE PROTEIN: CRP: <0.5 mg/dL — ABNORMAL LOW (ref <0.60)

## 2014-07-17 LAB — HOMOCYSTEINE: Homocysteine: 12.1 umol/L (ref 4.0–15.4)

## 2014-07-17 LAB — HEMOGLOBIN A1C
HEMOGLOBIN A1C: 6 % — AB (ref <5.7)
MEAN PLASMA GLUCOSE: 126 mg/dL — AB (ref <117)

## 2014-07-17 LAB — RPR

## 2014-07-17 LAB — VITAMIN B12: VITAMIN B 12: 320 pg/mL (ref 211–911)

## 2014-07-17 MED ORDER — FLUDROCORTISONE ACETATE 0.1 MG PO TABS: 0.2000 mg | ORAL_TABLET | Freq: Two times a day (BID) | ORAL | Status: DC

## 2014-07-17 MED ORDER — MIDODRINE HCL 10 MG PO TABS: 10.0000 mg | ORAL_TABLET | Freq: Three times a day (TID) | ORAL | Status: DC

## 2014-07-17 NOTE — Progress Notes (Signed)
Patient discharged home with instructions given on medications,and follow up visits,patient verbalized understanding.Prescription sent with patient. Case Management spoke with patient regarding Home Health,patient spoke with daughter,and refusing services at this time. No c/o pain or discomfort noted.

## 2014-07-17 NOTE — Consult Note (Addendum)
[facility] A. Merlene Laughter, MD     [email or web address]          Erik [name] is an 68 y.o. male.   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 Most likely etiology is from neuropathy as evidenced on the physical examination. Agree with current medications for symptomatically relief. Leg crossing maneuver is suggested and has been explained to the patient to help with acute symptoms on standing. The patient will be placed on thigh-high mild firm support stocking. It appears that tramadol aggravate orthostatic hypotension and therefore we should avoid this.  Idiopathic neuropathy. A limited workup will be initiated in the hospital with labs including vitamin B12 level, hemoglobin A1c, homocystine level and thiamine.  Radicular symptoms. Lumbar spine MRI will be obtained.   The patient is 68 year old male who has had dizziness on standing associated with presyncope last couple weeks. He has had repeated documentations of marked orthostatic hypotension. He was seen in emergency room 4 times before he was admitted to the hospital. The patient has had difficult time with his blood pressure. He has been hospitalized for over a week trying to get this under control. He apparently has been quite symptomatically. He has been treated with IV hydration, Florinef and midodrine. These medications have been gradually titrated and he has noted a significant difference. He still reports dizziness on standing. It appears that he still has had orthostatic parameters. Last checked about a day ago lying down blood pressure 131/68 heart rate 68. Standing 73/52 heart rate 109. Before that lying down 134/76 heart rate 76. Standing 70/46 and heart rate 90. Patient tells me that he thinks that his low back pain may be related to his symptoms. He has had severe burning involving the mid lower spinal region. Symptoms readmitted on both legs. Over the course of last week however  he reports that his symptoms have been more severe and radiates in rostrally. He does not report weakness in the arms and legs however. He does not report having tingling or numbness additionally. Patient has a history of all clots and but he has not had anything to drink since 1986. Review systems is otherwise unremarkable.  GENERAL: This a pleasant thin man in no acute distress.  HEENT: Normal.  ABDOMEN: soft  EXTREMITIES: No edema   BACK: Normal alignment. No soreness on palpation of the lumbar and thoracic spinal regions. No clear abnormalities observed.  SKIN: Normal by inspection.    MENTAL STATUS: Alert and oriented. Speech, language and cognition are generally intact. Judgment and insight normal.   CRANIAL NERVES: Pupils are equal, round and reactive to light and accomodation; extra ocular movements are full, there is no significant nystagmus; visual fields are full; upper and lower facial muscles are normal in strength and symmetric, there is no flattening of the nasolabial folds; tongue is midline; uvula is midline; shoulder elevation is normal.  MOTOR: Normal tone, bulk and strength; no pronator drift.  COORDINATION: Left finger to nose is normal, right finger to nose is normal, No rest tremor; no intention tremor; no postural tremor; no bradykinesia.  REFLEXES: Deep tendon reflexes are symmetrical and normal in the upper extremities, but absent in the legs even with augmentation. Babinski reflexes are flexor bilaterally.   SENSATION: Normal to light touch and temperature.      Blood pressure 115/62, pulse 70, temperature 98.4 F (36.9 C), temperature source Oral, resp. rate 19, height _0  (1.905 m), weight 86.183 kg (190 lb), SpO2 99 %.  Past Medical History  Diagnosis Date  . Seizures   . Chronic back pain   . DDD (degenerative disc disease), lumbar     History reviewed. No pertinent past surgical history.  History reviewed. No pertinent family  history.  Social History:  reports that he has never smoked. He does not have any smokeless tobacco history on file. He reports that he does not drink alcohol or use illicit drugs.  Allergies: No Known Allergies  Medications: Prior to Admission medications   Medication Sig Start Date End Date Taking? Authorizing Provider  aspirin EC 81 MG tablet Take 81 mg by mouth daily.   Yes Historical Provider, MD  HYDROcodone-acetaminophen (NORCO/VICODIN) 5-325 MG per tablet Take 1 tablet by mouth every 6 (six) hours as needed for moderate pain or severe pain. 07/04/14  Yes Quintella Reichert, MD  traMADol (ULTRAM) 50 MG tablet Take 1 tablet (50 mg total) by mouth every 6 (six) hours as needed. Patient not taking: Reported on 07/07/2014 06/25/14   Montine Circle, PA-C    Scheduled Meds: . aspirin EC  81 mg Oral Daily  . enoxaparin (LOVENOX) injection  40 mg Subcutaneous Q24H  . fludrocortisone  0.2 mg Oral BID  . midodrine  10 mg Oral TID WC  . sodium chloride  3 mL Intravenous Q12H   Continuous Infusions:  PRN Meds:.acetaminophen **OR** acetaminophen, alum & mag hydroxide-simeth, docusate sodium, HYDROcodone-acetaminophen     Results for orders placed or performed during the hospital encounter of 07/07/14 (from the past 48 hour(s))  Basic metabolic panel     Status: Abnormal   Collection Time: 07/16/14  6:04 AM  Result Value Ref Range   Sodium 142 137 - 147 mEq/L   Potassium 4.2 3.7 - 5.3 mEq/L   Chloride 102 96 - 112 mEq/L   CO2 29 19 - 32 mEq/L   Glucose, Bld 96 70 - 99 mg/dL   BUN 11 6 - 23 mg/dL   Creatinine, Ser 1.09 0.50 - 1.35 mg/dL   Calcium 9.0 8.4 - 10.5 mg/dL   GFR calc non Af Amer 67 (L) >90 mL/min   GFR calc Af Amer 78 (L) >90 mL/min    Comment: (NOTE) The eGFR has been calculated using the CKD EPI equation. This calculation has not been validated in all clinical situations. eGFR's persistently <90 mL/min signify possible Chronic Kidney Disease.    Anion gap 11 5 - 15   CBC     Status: Abnormal   Collection Time: 07/16/14  6:04 AM  Result Value Ref Range   WBC 8.8 4.0 - 10.5 K/uL   RBC 5.24 4.22 - 5.81 MIL/uL   Hemoglobin 12.2 (L) 13.0 - 17.0 g/dL   HCT 36.4 (L) 39.0 - 52.0 %   MCV 69.5 (L) 78.0 - 100.0 fL   MCH 23.3 (L) 26.0 - 34.0 pg   MCHC 33.5 30.0 - 36.0 g/dL   RDW 15.1 11.5 - 15.5 %   Platelets 272 150 - 400 K/uL    Studies/Results:     Jermane Brayboy A. Merlene Laughter, M.D.  Diplomate, [employer] of Psychiatry and Neurology ( Neurology). 07/17/2014, 8:39 AM

## 2014-07-17 NOTE — Discharge Summary (Addendum)
Physician Discharge Summary  Erik [name] LGX:[id number] DOB: [birth year] DOA: 07/07/2014  PCP: PROVIDER NOT IN SYSTEM  Admit date: 07/07/2014 Discharge date: 07/17/2014  Time spent: 45 minutes  Recommendations for Outpatient Follow-up:  -Will be discharged home today. -Has been set up with a new PCP at the [facility] and will also have an appointment scheduled to follow up with Dr. Merlene Laughter.   Discharge Diagnoses:  Principal Problem:   Orthostatic hypotension Active Problems:   Orthostasis   Low back pain   Discharge Condition: Stable and improved  Filed Weights   07/07/14 1516  Weight: 86.183 kg (190 lb)    History of present illness:  68 y.o. male  Who is presumably healthy, although he has not seen a primary care physician for many years. He has a remote history of alcohol abuse and has visited the ER 4 times over the past several weeks. His recent complaint is presyncope that started on Wednesday and has gradually worsened since that time. This presyncopal feeling occurs within a few moments of him standing up. The feeling does not go away if he stands at the side of his bed for a few moments before moving. He denies decreased fluid intake, diarrhea, nausea, vomiting.   Hospital Course:   Orthostatic Hypotension -Quite impressive orthostatic changes, still; despite hydration and initiation of several different medications including florinef and midodrine. 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 -Seen by neurology who agrees. B12 has been added which is low-normal at 320. -Continue OP neurologic follow up. - TSH/cortisol/HIV WNL. -Thigh-high TED hose in place. -Continue mobilization. -Have educated extensively on physical measures to help prevent falling from orthostasis. -Will arrange for [facility]. -Avoid use of tramadol and narcotics as much as possible.  Low Back Pain -MRI L spine: Lumbar spondylosis and degenerative disc disease, causing  mild impingement at L3-4 and L4-5, as detailed above. -No acute surgical indication. -Continue follow up with neurology as OP.  Procedures:  None   Consultations:  Neurology  Discharge Instructions  Discharge Instructions    Increase activity slowly    Complete by:  As directed             Medication List    STOP taking these medications        traMADol 50 MG tablet  Commonly known as:  ULTRAM      TAKE these medications        aspirin EC 81 MG tablet  Take 81 mg by mouth daily.     fludrocortisone 0.1 MG tablet  Commonly known as:  FLORINEF  Take 2 tablets (0.2 mg total) by mouth 2 (two) times daily.     HYDROcodone-acetaminophen 5-325 MG per tablet  Commonly known as:  NORCO/VICODIN  Take 1 tablet by mouth every 6 (six) hours as needed for moderate pain or severe pain.     midodrine 10 MG tablet  Commonly known as:  PROAMATINE  Take 1 tablet (10 mg total) by mouth 3 (three) times daily with meals.       No Known Allergies     Follow-up Information    Follow up with Phillips Odor, MD On 08/14/2014.   Specialty:  Neurology   Why:  at 8:30 am   Contact information:   [address] [place] [address] [phone]       Follow up with Alphia Kava On 08/21/2014.   Why:  at 2:00   Contact information:   [address] [address] [phone]       Follow up with [facility], KOFI, MD. Schedule an appointment as soon as possible for a visit in 2 weeks.   Specialty:  Neurology   Contact information:   [address] [place] [address] [phone]        The results of significant diagnostics from this hospitalization (including imaging, microbiology, ancillary and laboratory) are listed below for reference.    Significant Diagnostic Studies: Dg Chest 2 View  07/07/2014   CLINICAL DATA:  Weakness and dizziness for the past 3 days.  EXAM: CHEST  2 VIEW  COMPARISON:  07/04/2014.  FINDINGS: Normal sized  heart. Clear lungs with normal vascularity. The lungs remain hyperexpanded. Mild thoracic spine degenerative changes and mild scoliosis.  IMPRESSION: No acute abnormality.  Stable changes of COPD.   Electronically Signed   By: Enrique Sack M.D.   On: 07/07/2014 17:43   Dg Chest 2 View  07/04/2014   CLINICAL DATA:  Shortness of breath.  EXAM: CHEST  2 VIEW  COMPARISON:  June 25, 2014.  FINDINGS: The heart size and mediastinal contours are within normal limits. Both lungs are clear. No pneumothorax or pleural effusion is noted. The visualized skeletal structures are unremarkable.  IMPRESSION: No acute cardiopulmonary abnormality seen.   Electronically Signed   By: Sabino Dick M.D.   On: 07/04/2014 16:10   Dg Chest 2 View  06/25/2014   CLINICAL DATA:  Back pain and shortness of breath for 2 days  EXAM: CHEST  2 VIEW  COMPARISON:  05/11/2013.  FINDINGS: The heart size and mediastinal contours are within normal limits. Both lungs are clear. The visualized skeletal structures are unremarkable.  IMPRESSION: No active cardiopulmonary disease.   Electronically Signed   By: Kerby Moors M.D.   On: 06/25/2014 13:56   Dg Lumbar Spine Complete  07/04/2014   CLINICAL DATA:  Severe low back pain for 1 month. Radiates down both legs.  EXAM: LUMBAR SPINE - COMPLETE 4+ VIEW  COMPARISON:  05/11/2013  FINDINGS: Degenerative disc disease in the mid and lower lumbar spine. Slight anterolisthesis of L4 on L5, stable. Disc spaces are maintained. No fracture. SI joints are symmetric and unremarkable.  IMPRESSION: Degenerative facet changes.  No acute bony abnormality.   Electronically Signed   By: Rolm Baptise M.D.   On: 07/04/2014 18:08   Ct Angio Chest Pe W/cm &/or Wo Cm  07/04/2014   CLINICAL DATA:  Short of breath, back pain  EXAM: CT ANGIOGRAPHY CHEST WITH CONTRAST  TECHNIQUE: Multidetector CT imaging of the chest was performed using the standard protocol during bolus administration of intravenous contrast.  Multiplanar CT image reconstructions and MIPs were obtained to evaluate the vascular anatomy.  CONTRAST:  64mL OMNIPAQUE IOHEXOL 350 MG/ML SOLN  COMPARISON:  None.  FINDINGS: No filling defects within the pulmonary arteries to suggest acute pulmonary embolism. No acute findings of the aorta or great vessels. No pericardial fluid. Esophagus is normal.  Lung parenchyma demonstrates centrilobular emphysema in the upper lobes.  There is no axillary supraclavicular adenopathy. There is a prevascular lymph node measuring 5 mm. No paratracheal adenopathy. No hilar adenopathy  Limited view of the upper abdomen is unremarkable. Limited view of the skeleton demonstrates degenerate spurring of the endplates.  Review of the MIP images confirms the above findings.  IMPRESSION: 1. No evidence of acute pulmonary embolism. 2. No acute pulmonary parenchymal findings. 3. Centrilobular emphysema in upper lobes. 4. Degenerative change  of the spine.   Electronically Signed   By: Suzy Bouchard M.D.   On: 07/04/2014 18:49   Mr Lumbar Spine Wo Contrast  07/17/2014   CLINICAL DATA:  Low back pain for the last several years. Lumbar degenerative disc disease.  EXAM: MRI LUMBAR SPINE WITHOUT CONTRAST  TECHNIQUE: Multiplanar, multisequence MR imaging of the lumbar spine was performed. No intravenous contrast was administered.  COMPARISON:  07/04/2014 radiographs  FINDINGS: The lowest lumbar type non-rib-bearing vertebra is labeled as L5. The conus medullaris appears normal. Conus level: L1-2.  There is 3 mm grade 1 degenerative anterolisthesis at L4-5. Schmorl's node posteriorly along the inferior endplate of L3 with surrounding reactive endplate findings.  Additional findings at individual levels are as follows:  L1-2:  Unremarkable.  L2-3:  No impingement.  Diffuse disc bulge.  L3-4: Mild bilateral foraminal stenosis due to disc bulge and left foraminal disc protrusion. Mild facet arthropathy.  L4-5: Mild right and borderline left  foraminal stenosis with borderline bilateral subarticular lateral recess stenosis due to disc bulge and right greater than left facet arthropathy as well as disc uncovering. Right foraminal annular fissure.  L5-S1:  No impingement.  Disc bulge with facet arthropathy.  IMPRESSION: 1. Lumbar spondylosis and degenerative disc disease, causing mild impingement at L3-4 and L4-5, as detailed above.   Electronically Signed   By: Sherryl Barters M.D.   On: 07/17/2014 12:23    Microbiology: Recent Results (from the past 240 hour(s))  Urine culture     Status: None   Collection Time: 07/07/14  5:45 PM  Result Value Ref Range Status   Specimen Description URINE, CLEAN CATCH  Final   Special Requests NONE  Final   Culture  Setup Time   Final    07/08/2014 21:38 Performed at [facility]   Final    20,OOO COLONIES/ML Performed at [facility]    Culture   Final    Multiple bacterial morphotypes present, none predominant. Suggest appropriate recollection if clinically indicated. Performed at [facility]    Report Status 07/10/2014 FINAL  Final     Labs: Basic Metabolic Panel:  Recent Labs Lab 07/12/14 0540 07/16/14 0604  NA 141 142  K 4.3 4.2  CL 103 102  CO2 29 29  GLUCOSE 99 96  BUN 9 11  CREATININE 1.18 1.09  CALCIUM 9.0 9.0   Liver Function Tests: No results for input(s): AST, ALT, ALKPHOS, BILITOT, PROT, ALBUMIN in the last 168 hours. No results for input(s): LIPASE, AMYLASE in the last 168 hours. No results for input(s): AMMONIA in the last 168 hours. CBC:  Recent Labs Lab 07/12/14 0540 07/16/14 0604  WBC 8.8 8.8  HGB 12.3* 12.2*  HCT 37.3* 36.4*  MCV 68.9* 69.5*  PLT 262 272   Cardiac Enzymes: No results for input(s): CKTOTAL, CKMB, CKMBINDEX, TROPONINI in the last 168 hours. BNP: BNP (last 3 results)  Recent Labs  06/25/14 1325 07/04/14 1525  PROBNP 27.9 36.7   CBG: No results for input(s): GLUCAP in the last 168  hours.     SignedLelon Frohlich  [facility] Pager: [phone] 07/17/2014, 3:14 PM

## 2014-07-17 NOTE — Progress Notes (Signed)
Accompanied by staff to an awaiting vehicle.

## 2014-07-17 NOTE — Plan of Care (Signed)
Problem: Discharge Progression Outcomes Goal: Discharge plan in place and appropriate Outcome: Completed/Met Date Met:  07/17/14 Goal: Hemodynamically stable Outcome: Completed/Met Date Met:  06/30/61 Goal: Complications resolved/controlled Outcome: Progressing Goal: Activity appropriate for discharge plan Outcome: Completed/Met Date Met:  07/17/14 Goal: Other Discharge Outcomes/Goals Outcome: Completed/Met Date Met:  07/17/14

## 2014-07-18 LAB — ANA: Anti Nuclear Antibody(ANA): NEGATIVE

## 2014-07-18 NOTE — Progress Notes (Signed)
UR chart review completed.  

## 2014-07-27 ENCOUNTER — Emergency Department (HOSPITAL_COMMUNITY): Payer: Medicare Other

## 2014-07-27 ENCOUNTER — Encounter (HOSPITAL_COMMUNITY): Payer: Self-pay | Admitting: *Deleted

## 2014-07-27 ENCOUNTER — Emergency Department (HOSPITAL_COMMUNITY)
Admission: EM | Admit: 2014-07-27 | Discharge: 2014-07-27 | Disposition: A | Discharge status: Home / Self Care | Payer: Medicare Other | Attending: Emergency Medicine | Admitting: Emergency Medicine

## 2014-07-27 DIAGNOSIS — M545 Low back pain, unspecified: Secondary | ICD-10-CM

## 2014-07-27 DIAGNOSIS — Z7982 Long term (current) use of aspirin: Secondary | ICD-10-CM | POA: Diagnosis not present

## 2014-07-27 DIAGNOSIS — G8929 Other chronic pain: Secondary | ICD-10-CM | POA: Diagnosis not present

## 2014-07-27 DIAGNOSIS — Z79899 Other long term (current) drug therapy: Secondary | ICD-10-CM | POA: Diagnosis not present

## 2014-07-27 DIAGNOSIS — Z7951 Long term (current) use of inhaled steroids: Secondary | ICD-10-CM | POA: Insufficient documentation

## 2014-07-27 DIAGNOSIS — I4510 Unspecified right bundle-branch block: Secondary | ICD-10-CM | POA: Diagnosis not present

## 2014-07-27 LAB — CBC WITH DIFFERENTIAL/PLATELET
BASOS ABS: 0.1 10*3/uL (ref 0.0–0.1)
Basophils Relative: 1 % (ref 0–1)
EOS ABS: 0.1 10*3/uL (ref 0.0–0.7)
Eosinophils Relative: 2 % (ref 0–5)
HEMATOCRIT: 35.8 % — AB (ref 39.0–52.0)
Hemoglobin: 11.5 g/dL — ABNORMAL LOW (ref 13.0–17.0)
LYMPHS ABS: 1.6 10*3/uL (ref 0.7–4.0)
Lymphocytes Relative: 22 % (ref 12–46)
MCH: 22.4 pg — ABNORMAL LOW (ref 26.0–34.0)
MCHC: 32.1 g/dL (ref 30.0–36.0)
MCV: 69.8 fL — AB (ref 78.0–100.0)
MONO ABS: 0.4 10*3/uL (ref 0.1–1.0)
Monocytes Relative: 5 % (ref 3–12)
NEUTROS ABS: 5.2 10*3/uL (ref 1.7–7.7)
Neutrophils Relative %: 70 % (ref 43–77)
Platelets: 273 10*3/uL (ref 150–400)
RBC: 5.13 MIL/uL (ref 4.22–5.81)
RDW: 14.7 % (ref 11.5–15.5)
WBC: 7.4 10*3/uL (ref 4.0–10.5)

## 2014-07-27 LAB — URINALYSIS, ROUTINE W REFLEX MICROSCOPIC
Glucose, UA: NEGATIVE mg/dL
HGB URINE DIPSTICK: NEGATIVE
KETONES UR: 15 mg/dL — AB
Leukocytes, UA: NEGATIVE
Nitrite: NEGATIVE
PROTEIN: NEGATIVE mg/dL
SPECIFIC GRAVITY, URINE: 1.017 (ref 1.005–1.030)
UROBILINOGEN UA: 1 mg/dL (ref 0.0–1.0)
pH: 6 (ref 5.0–8.0)

## 2014-07-27 LAB — COMPREHENSIVE METABOLIC PANEL
ALK PHOS: 54 U/L (ref 39–117)
ALT: 9 U/L (ref 0–53)
AST: 14 U/L (ref 0–37)
Albumin: 3.5 g/dL (ref 3.5–5.2)
Anion gap: 15 (ref 5–15)
BUN: 11 mg/dL (ref 6–23)
CALCIUM: 9.1 mg/dL (ref 8.4–10.5)
CO2: 24 mEq/L (ref 19–32)
Chloride: 99 mEq/L (ref 96–112)
Creatinine, Ser: 0.95 mg/dL (ref 0.50–1.35)
GFR, EST NON AFRICAN AMERICAN: 83 mL/min — AB (ref >90)
GLUCOSE: 86 mg/dL (ref 70–99)
POTASSIUM: 3.2 meq/L — AB (ref 3.7–5.3)
Sodium: 138 mEq/L (ref 137–147)
Total Bilirubin: 0.7 mg/dL (ref 0.3–1.2)
Total Protein: 7 g/dL (ref 6.0–8.3)

## 2014-07-27 LAB — LIPASE, BLOOD: Lipase: 25 U/L (ref 11–59)

## 2014-07-27 LAB — TROPONIN I: Troponin I: <0.3 ng/mL (ref <0.30)

## 2014-07-27 MED ORDER — IOHEXOL 300 MG/ML  SOLN
100.0000 mL | Freq: Once | INTRAMUSCULAR | Status: AC | PRN
  Administered 2014-07-27: 100 mL via INTRAVENOUS

## 2014-07-27 MED ORDER — TRAMADOL HCL 50 MG PO TABS: 100.0000 mg | ORAL_TABLET | Freq: Four times a day (QID) | ORAL | Status: DC | PRN

## 2014-07-27 MED ORDER — SODIUM CHLORIDE 0.9 % IV SOLN
INTRAVENOUS | Status: DC
  Administered 2014-07-27: 18:00:00 via INTRAVENOUS

## 2014-07-27 MED ORDER — MORPHINE SULFATE 4 MG/ML IJ SOLN
4.0000 mg | Freq: Once | INTRAMUSCULAR | Status: AC
  Administered 2014-07-27: 4 mg via INTRAVENOUS
  Filled 2014-07-27: qty 1

## 2014-07-27 MED ORDER — ONDANSETRON HCL 4 MG/2ML IJ SOLN
4.0000 mg | Freq: Once | INTRAMUSCULAR | Status: AC
  Administered 2014-07-27: 4 mg via INTRAVENOUS
  Filled 2014-07-27: qty 2

## 2014-07-27 MED ORDER — ORPHENADRINE CITRATE ER 100 MG PO TB12: 100.0000 mg | ORAL_TABLET | Freq: Two times a day (BID) | ORAL | Status: DC

## 2014-07-27 NOTE — ED Notes (Signed)
Pt reports having lower back pain for several days, radiates down bilateral legs. Denies injury to back or history of back problems. Denies incontinence but reports difficulty with bearing weight. Was recently admitted to [facility] for dehydration, denies any urinary symptoms.

## 2014-07-27 NOTE — ED Notes (Signed)
Patient transported to CT 

## 2014-07-27 NOTE — ED Notes (Signed)
Patient returned from CT

## 2014-07-27 NOTE — Discharge Instructions (Signed)
Back Pain, Adult Low back pain is very common. About 1 in 5 people have back pain.The cause of low back pain is rarely dangerous. The pain often gets better over time.About half of people with a sudden onset of back pain feel better in just 2 weeks. About 8 in 10 people feel better by 6 weeks.  CAUSES Some common causes of back pain include:  Strain of the muscles or ligaments supporting the spine.  Wear and tear (degeneration) of the spinal discs.  Arthritis.  Direct injury to the back. DIAGNOSIS Most of the time, the direct cause of low back pain is not known.However, back pain can be treated effectively even when the exact cause of the pain is unknown.Answering your caregiver's questions about your overall health and symptoms is one of the most accurate ways to make sure the cause of your pain is not dangerous. If your caregiver needs more information, he or she may order lab work or imaging tests (X-rays or MRIs).However, even if imaging tests show changes in your back, this usually does not require surgery. HOME CARE INSTRUCTIONS For many people, back pain returns.Since low back pain is rarely dangerous, it is often a condition that people can learn to [facility] their own.   Remain active. It is stressful on the back to sit or stand in one place. Do not sit, drive, or stand in one place for more than 30 minutes at a time. Take short walks on level surfaces as soon as pain allows.Try to increase the length of time you walk each day.  Do not stay in bed.Resting more than 1 or 2 days can delay your recovery.  Do not avoid exercise or work.Your body is made to move.It is not dangerous to be active, even though your back may hurt.Your back will likely heal faster if you return to being active before your pain is gone.  Pay attention to your body when you bend and lift. Many people have less discomfortwhen lifting if they bend their knees, keep the load close to their bodies,and  avoid twisting. Often, the most comfortable positions are those that put less stress on your recovering back.  Find a comfortable position to sleep. Use a firm mattress and lie on your side with your knees slightly bent. If you lie on your back, put a pillow under your knees.  Only take over-the-counter or prescription medicines as directed by your caregiver. Over-the-counter medicines to reduce pain and inflammation are often the most helpful.Your caregiver may prescribe muscle relaxant drugs.These medicines help dull your pain so you can more quickly return to your normal activities and healthy exercise.  Put ice on the injured area.  Put ice in a plastic bag.  Place a towel between your skin and the bag.  Leave the ice on for 15-20 minutes, 03-04 times a day for the first 2 to 3 days. After that, ice and heat may be alternated to reduce pain and spasms.  Ask your caregiver about trying back exercises and gentle massage. This may be of some benefit.  Avoid feeling anxious or stressed.Stress increases muscle tension and can worsen back pain.It is important to recognize when you are anxious or stressed and learn ways to manage it.Exercise is a great option. SEEK MEDICAL CARE IF:  You have pain that is not relieved with rest or medicine.  You have pain that does not improve in 1 week.  You have new symptoms.  You are generally not feeling well. SEEK  IMMEDIATE MEDICAL CARE IF:   You have pain that radiates from your back into your legs.  You develop new bowel or bladder control problems.  You have unusual weakness or numbness in your arms or legs.  You develop nausea or vomiting.  You develop abdominal pain.  You feel faint. Document Released: 07/27/2005 Document Revised: 01/26/2012 Document Reviewed: 11/28/2013 [facility] Patient Information 2015 [place], [place]. This information is not intended to replace advice given to you by your health care provider. Make sure you  discuss any questions you have with your health care provider.  Emergency Department Resource Guide 1) Find a Doctor and Pay Out of Pocket Although you won't have to find out who is covered by your insurance plan, it is a good idea to ask around and get recommendations. You will then need to call the office and see if the doctor you have chosen will accept you as a new patient and what types of options they offer for patients who are self-pay. Some doctors offer discounts or will set up payment plans for their patients who do not have insurance, but you will need to ask so you aren't surprised when you get to your appointment.  2) Contact Your Local Health Department Not all health departments have doctors that can see patients for sick visits, but many do, so it is worth a call to see if yours does. If you don't know where your local health department is, you can check in your phone book. The CDC also has a tool to help you locate your state's health department, and many state websites also have listings of all of their local health departments.  3) Find a [facility] If your illness is not likely to be very severe or complicated, you may want to try a walk in clinic. These are popping up all over the country in pharmacies, drugstores, and shopping centers. They're usually staffed by nurse practitioners or physician assistants that have been trained to treat common illnesses and complaints. They're usually fairly quick and inexpensive. However, if you have serious medical issues or chronic medical problems, these are probably not your best option.  No Primary Care Doctor: - Call Health Connect at  [phone] - they can help you locate a primary care doctor that  accepts your insurance, provides certain services, etc. - Physician Referral Service- [phone]  Chronic Pain Problems: Organization         Address  Phone   Notes  [facility]  [phone] Patients need to be  referred by their primary care doctor.   Medication Assistance: Organization         Address  Phone   Notes  [facility] Medication [facility] [address] [place], [place] [address] [phone] --Must be a resident of [facility] -- Must have NO insurance coverage whatsoever (no Medicaid/ Medicare, etc.) -- The pt. MUST have a primary care doctor that directs their care regularly and follows them in the community   MedAssist  [phone]   [employer]  [phone]    Agencies that provide inexpensive medical care: Organization         Address  Phone   Notes  [place]  [phone]   [facility]    [phone]   [facility] [place], [place] [address] [phone]   [place] (  [phone]   Planned Parenthood    [phone]   [facility]    [phone]   [place] [address], [place] Phone:  [phone], Fax:  [phone] Hours of Operation:  9 am - 6 pm, M-F.  Also accepts Medicaid/Medicare and self-pay.  [facility] for [place] [address], [address], [place] Phone: [phone], Fax: [phone]. Hours of Operation:  8:30 am - 5:30 pm, M-F.  Also accepts Medicaid and self-pay.  [facility] [address] [place] Phone: [phone]   [place], [place], [place] [phone], Ext. [phone] Mondays & Thursdays: 7-9 AM.  First 15 patients are seen on a first come, first serve basis.    [place] Providers:  Organization         Address  Phone   Notes  [facility] [address], [place] [phone] Also accepts self-pay patients.  [facility] [address], [place]  [phone]   [place], [address], [place] [phone]   [facility] [address], [place] [phone]   Lucianne Lei [address], [place]   [phone] Only accepts [place] Access [place] patients after they have their name applied to their card.   Self-Pay (no insurance) in [facility]:  Organization         Address  Phone   Notes  Sickle Cell Patients, [facility] [place] [phone]   [facility] [place] [phone]   [facility] [place]  [place], [place], [place] [phone]   [facility]/Dr. Osei-Bonsu  [address], [place] or [address], [place] [phone] Phone number for both [place] and [place] locations is the same.  [facility] [address], [place] [phone]   [facility] [address] [place] or [address] [phone]   [facility] [address], [place] [phone], phone; [phone], fax Sees patients 1st and 3rd Saturday of every month.  Must not qualify for public or private insurance (i.e. Medicaid, Medicare, [place] Health Choice, Veterans' Benefits)  Household income should be no more than 200% of the poverty level The clinic cannot treat you if you are pregnant or think you are pregnant  Sexually transmitted diseases are not treated at the clinic.    Dental Care: Organization         Address  Phone  Notes  [facility] Department of [facility] [place] [phone] Accepts children up to age 60 who are enrolled in [place] or [place]; pregnant women with a Medicaid card; and children who have applied for Medicaid or [place] Health Choice, but were declined, whose parents can  pay a reduced fee at time of service.  [facility] Department of [facility]  [address], [place] [phone] Accepts children up to age 63 who are enrolled in [place] or [place]; pregnant women with a Medicaid card; and children who have applied for Medicaid or [place]  Choice, but were declined, whose parents can pay a reduced fee at time of service.  [place] Adult Dental Access PROGRAM  [place] [phone] Patients are seen by appointment only. Walk-ins are not accepted. [place] will see patients 72 years of age and older. Monday - Tuesday (8am-5pm) Most Wednesdays (8:30-5pm) $30 per visit, cash only  [facility] Adult Dental Access PROGRAM  [address], [facility] [phone] Patients are seen by appointment only. Walk-ins are not accepted. [place] will see patients 70 years of age and older. One Wednesday Evening (Monthly: Volunteer Based).  $30 per visit, cash only  [place]  [phone] for adults; Children under age 35, call Graduate Pediatric Dentistry at [phone]. Children aged 52-14, please call [phone] to request a pediatric application.  Dental services are provided in all areas of dental care including fillings, crowns and bridges, complete and partial dentures, implants, gum treatment, root canals, and extractions. Preventive care is also provided. Treatment is provided to both adults and children. Patients are selected via a lottery and there is often a waiting list.   [facility] [address] [place]  [phone] [email or web address]   [facility] [address] [place], [place] [phone], Ext. [phone] Second and Fourth Thursday of each month, opens at 6:30 AM; Clinic ends at 9 AM.  Patients are seen on a first-come first-served basis, and a limited number are seen during each clinic.   [facility]  [address] Hillard Danker [place], [place] [phone]   Eligibility Requirements You must have lived in [place], [place], or [place] counties for at least the last three months.   You cannot be eligible for state or federal sponsored [employer], including [employer], [place], or [employer].   You generally cannot be eligible for healthcare insurance through your employer.    How to apply: Eligibility screenings are held every Tuesday and Wednesday afternoon from 1:00 pm until 4:00 pm. You do not need an appointment for the interview!  [facility] [address] [place], [place]   [place]  [place] Department  [place]  [phone]    Behavioral Health Resources in the Community: Intensive Outpatient Programs Organization         Address  Phone  Notes  [facility] [address], [place], [place] [phone]   [facility] [address], [place], [place]   ADS: Alcohol & Drug Svcs [address], [place], [place]   [place] [address]  [place], [place] or [phone]   Substance Abuse Resources Organization         Address  Phone  Notes  Alcohol and Drug Services  [phone]   [place]  [phone]   [facility] [place]   Chinita Pester  [phone]   Residential & Outpatient Substance Abuse Program  [phone]   Psychological Services Organization         Address  Phone  Notes  [facility] [place]  [place]  [phone]   [place] [address], [place] or [phone]    Mobile Crisis Teams Organization         Address  Phone  Notes  [facility], Mobile Crisis Care Unit  [phone]    [facility]ive Psychotherapeutic Services  [address] [place], [place]   [facility] [address] [place] [phone]    Self-Help/Support Groups Organization         Address  Phone             Notes  [place]. of [place] - variety of support groups  [place] Call for more information  Narcotics Anonymous (NA), [facility] [address], [employer] [place]  2 meetings at this location   Special educational needs teacher         Address  Phone  Notes  [facility]idential [facility] [place],    [place]  [phone]   [facility]  [address], [place] [address], [place], [place]   [place], [place] [phone] Admissions: 8am-3pm M-F  Incentives Substance [facility] [phone],    [place], [place] [phone]   [facility] [address] [place], [place], [place]   [facility] [address]  [place], [place]   [facility] - Intensive Outpatient [address] Kristeen [address], [place], [place]   [facility] [place] [facility] [place].,  [place], [place] [phone] or [phone]   [facility]idential Treatment Services (RTS) [address], [place], [place] Accepts Medicaid  [facility] [place] [address]  [place] [phone] Substance Abuse/Addiction Treatment   [facility] Organization         Address  Phone  Notes  [facility]  [phone]   Domenic Schwab, PhD [address] Arlis Port[place], [place]   [phone] or [phone]   [place], [place] [phone]   [facility] [address], [place], [place] [phone] Insurance/Medicaid/sponsorship through [facility] [place] and Families [address], [address] [place]                                     [place], [place] [phone] [place] [address], [place] [phone]    Dr. Adele Schilder  [phone]   [facility] Dept. 1) [address], [place] 2) [place] 3)  [address], [place] [phone]  [phone]   [place] [phone] or [phone] (After Hours)

## 2014-07-27 NOTE — ED Provider Notes (Signed)
CSN: [id number]     Arrival date & time 07/27/14  1520 History   First MD Initiated Contact with Patient 07/27/14 1636     Chief Complaint  Patient presents with  . Back Pain     (Consider location/radiation/quality/duration/timing/severity/associated sxs/prior Treatment) HPI The patient reports that he has lower back pain that has gotten very severe. He reports that he had a history of low back pain but this is not consistent with what he is experienced in the past. He reports that the pain comes up his back and radiates up into his arms sometimes as well as down both of his legs. He denies he's having urinary difficulty or pain. He denies bowel incontinence or constipation. Past Medical History  Diagnosis Date  . Seizures   . Chronic back pain   . DDD (degenerative disc disease), lumbar    History reviewed. No pertinent past surgical history. History reviewed. No pertinent family history. History  Substance Use Topics  . Smoking status: Never Smoker   . Smokeless tobacco: Not on file  . Alcohol Use: No    Review of Systems  10 Systems reviewed and are negative for acute change except as noted in the HPI.   Allergies  Review of patient's allergies indicates no known allergies.  Home Medications   Prior to Admission medications   Medication Sig Start Date End Date Taking? Authorizing Provider  aspirin EC 81 MG tablet Take 81 mg by mouth daily.   Yes Historical Provider, MD  fludrocortisone (FLORINEF) 0.1 MG tablet Take 2 tablets (0.2 mg total) by mouth 2 (two) times daily. 07/17/14  Yes Estela Leonie Green, MD  midodrine (PROAMATINE) 10 MG tablet Take 1 tablet (10 mg total) by mouth 3 (three) times daily with meals. 07/17/14  Yes Estela Leonie Green, MD  HYDROcodone-acetaminophen (NORCO/VICODIN) 5-325 MG per tablet Take 1 tablet by mouth every 6 (six) hours as needed for moderate pain or severe pain. Patient not taking: Reported on 07/27/2014 07/04/14   Quintella Reichert, MD  orphenadrine (NORFLEX) 100 MG tablet Take 1 tablet (100 mg total) by mouth 2 (two) times daily. 07/27/14   Charlesetta Shanks, MD  traMADol (ULTRAM) 50 MG tablet Take 2 tablets (100 mg total) by mouth every 6 (six) hours as needed. 07/27/14   Charlesetta Shanks, MD   BP 148/72 mmHg  Pulse 76  Temp(Src) 97.7 F (36.5 C) (Oral)  Resp 17  SpO2 100% Physical Exam  Constitutional: He is oriented to person, place, and time. He appears well-developed and well-nourished.  HENT:  Head: Normocephalic and atraumatic.  Eyes: EOM are normal. Pupils are equal, round, and reactive to light.  Neck: Neck supple.  Cardiovascular: Normal rate, regular rhythm, normal heart sounds and intact distal pulses.   Pulmonary/Chest: Effort normal and breath sounds normal.  Abdominal: Soft. Bowel sounds are normal. He exhibits no distension. There is no tenderness.  Musculoskeletal: Normal range of motion. He exhibits no edema.  Neurological: He is alert and oriented to person, place, and time. He has normal strength. Coordination normal. GCS eye subscore is 4. GCS verbal subscore is 5. GCS motor subscore is 6.  Skin: Skin is warm, dry and intact.  Psychiatric: He has a normal mood and affect.    ED Course  Procedures (including critical care time) Labs Review Labs Reviewed  COMPREHENSIVE METABOLIC PANEL - Abnormal; Notable for the following:    Potassium 3.2 (*)    GFR calc non Af Amer 83 (*)  All other components within normal limits  CBC WITH DIFFERENTIAL - Abnormal; Notable for the following:    Hemoglobin 11.5 (*)    HCT 35.8 (*)    MCV 69.8 (*)    MCH 22.4 (*)    All other components within normal limits  URINALYSIS, ROUTINE W REFLEX MICROSCOPIC - Abnormal; Notable for the following:    Bilirubin Urine SMALL (*)    Ketones, ur 15 (*)    All other components within normal limits  LIPASE, BLOOD  TROPONIN I    Imaging Review Ct Abdomen Pelvis W Contrast  07/27/2014   CLINICAL DATA:  Low  back pain. Difficulty walking. Patient denies abdominal pain.  EXAM: CT ABDOMEN AND PELVIS WITH CONTRAST  TECHNIQUE: Multidetector CT imaging of the abdomen and pelvis was performed using the standard protocol following bolus administration of intravenous contrast.  CONTRAST:  129mL OMNIPAQUE IOHEXOL 300 MG/ML  SOLN  COMPARISON:  MRI lumbar spine 07/17/2014.  FINDINGS: BODY WALL: Unremarkable.  LOWER CHEST: Unremarkable.  ABDOMEN/PELVIS:  Liver: No focal abnormality.  Biliary: No evidence of biliary obstruction or stone.  Pancreas: Unremarkable.  Spleen: Unremarkable.  Adrenals: Unremarkable.  Kidneys and ureters: No hydronephrosis or stone.  Bladder: Unremarkable.  Reproductive: Unremarkable.  Bowel: No obstruction. Normal appendix.  Moderate stool burden.  Retroperitoneum: No mass or adenopathy.  Peritoneum: No free fluid or gas.  Vascular: No acute abnormality.  OSSEOUS: No acute abnormalities. Disc protrusions at L3-4 and L4-5 are better evaluated on prior MR.  IMPRESSION: No acute intra-abdominal or pelvic findings. Lumbar spondylosis as described.   Electronically Signed   By: Rolla Flatten M.D.   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 need repeat for inferior baseline.  Confirmed by Johnney Killian, MD, Jeannie Done [phone]) on 07/27/2014 5:27:45 PM      MDM   Final diagnoses:  Bilateral low back pain without sciatica   The patient presents with lower back pain. He does have chronic back pain however felt this was different. At this point time he described radiation up towards his chest from his lumbar spine region. I had concern for possible abdominal aortic aneurysm with risk factors and the patient's quality of back pain. As well  he had had a recent admission with hypotension. At this point time however CT shows degenerative spine changes but no acute intra-abdominal process. I do think he is safe currently for continued outpatient treatment with his family physician for chronic lumbar back pain.    Charlesetta Shanks, MD 07/27/14 2004

## 2014-07-30 ENCOUNTER — Emergency Department (HOSPITAL_COMMUNITY)
Admission: EM | Admit: 2014-07-30 | Discharge: 2014-07-30 | Disposition: A | Discharge status: Home / Self Care | Payer: Medicare Other | Attending: Emergency Medicine | Admitting: Emergency Medicine

## 2014-07-30 ENCOUNTER — Emergency Department (HOSPITAL_COMMUNITY): Payer: Medicare Other

## 2014-07-30 ENCOUNTER — Encounter (HOSPITAL_COMMUNITY): Payer: Self-pay | Admitting: Emergency Medicine

## 2014-07-30 DIAGNOSIS — R0602 Shortness of breath: Secondary | ICD-10-CM | POA: Insufficient documentation

## 2014-07-30 DIAGNOSIS — M5416 Radiculopathy, lumbar region: Secondary | ICD-10-CM | POA: Diagnosis not present

## 2014-07-30 DIAGNOSIS — G8929 Other chronic pain: Secondary | ICD-10-CM | POA: Diagnosis not present

## 2014-07-30 DIAGNOSIS — Z7951 Long term (current) use of inhaled steroids: Secondary | ICD-10-CM | POA: Insufficient documentation

## 2014-07-30 DIAGNOSIS — Z7982 Long term (current) use of aspirin: Secondary | ICD-10-CM | POA: Diagnosis not present

## 2014-07-30 DIAGNOSIS — M2550 Pain in unspecified joint: Secondary | ICD-10-CM | POA: Diagnosis not present

## 2014-07-30 DIAGNOSIS — R079 Chest pain, unspecified: Secondary | ICD-10-CM | POA: Diagnosis not present

## 2014-07-30 DIAGNOSIS — Z79899 Other long term (current) drug therapy: Secondary | ICD-10-CM | POA: Insufficient documentation

## 2014-07-30 DIAGNOSIS — R0689 Other abnormalities of breathing: Secondary | ICD-10-CM | POA: Diagnosis not present

## 2014-07-30 DIAGNOSIS — R0600 Dyspnea, unspecified: Secondary | ICD-10-CM | POA: Diagnosis not present

## 2014-07-30 DIAGNOSIS — M545 Low back pain: Secondary | ICD-10-CM | POA: Diagnosis present

## 2014-07-30 LAB — PRO B NATRIURETIC PEPTIDE: Pro B Natriuretic peptide (BNP): 103.2 pg/mL (ref 0–125)

## 2014-07-30 LAB — CBC WITH DIFFERENTIAL/PLATELET
Basophils Absolute: 0.1 10*3/uL (ref 0.0–0.1)
Basophils Relative: 1 % (ref 0–1)
EOS PCT: 5 % (ref 0–5)
Eosinophils Absolute: 0.4 10*3/uL (ref 0.0–0.7)
HCT: 37.3 % — ABNORMAL LOW (ref 39.0–52.0)
Hemoglobin: 12 g/dL — ABNORMAL LOW (ref 13.0–17.0)
LYMPHS ABS: 2.1 10*3/uL (ref 0.7–4.0)
Lymphocytes Relative: 23 % (ref 12–46)
MCH: 22.6 pg — AB (ref 26.0–34.0)
MCHC: 32.2 g/dL (ref 30.0–36.0)
MCV: 70.4 fL — AB (ref 78.0–100.0)
Monocytes Absolute: 0.7 10*3/uL (ref 0.1–1.0)
Monocytes Relative: 7 % (ref 3–12)
Neutro Abs: 5.8 10*3/uL (ref 1.7–7.7)
Neutrophils Relative %: 64 % (ref 43–77)
PLATELETS: 256 10*3/uL (ref 150–400)
RBC: 5.3 MIL/uL (ref 4.22–5.81)
RDW: 15 % (ref 11.5–15.5)
WBC: 9 10*3/uL (ref 4.0–10.5)

## 2014-07-30 LAB — BASIC METABOLIC PANEL
Anion gap: 13 (ref 5–15)
BUN: 8 mg/dL (ref 6–23)
CO2: 27 mEq/L (ref 19–32)
CREATININE: 1.19 mg/dL (ref 0.50–1.35)
Calcium: 9 mg/dL (ref 8.4–10.5)
Chloride: 104 mEq/L (ref 96–112)
GFR calc Af Amer: 70 mL/min — ABNORMAL LOW (ref >90)
GFR, EST NON AFRICAN AMERICAN: 61 mL/min — AB (ref >90)
GLUCOSE: 96 mg/dL (ref 70–99)
Potassium: 3.9 mEq/L (ref 3.7–5.3)
Sodium: 144 mEq/L (ref 137–147)

## 2014-07-30 LAB — TROPONIN I

## 2014-07-30 MED ORDER — HYDROCODONE-ACETAMINOPHEN 5-325 MG PO TABS: 1.0000 | ORAL_TABLET | ORAL | Status: DC | PRN

## 2014-07-30 MED ORDER — HYDROMORPHONE HCL 1 MG/ML IJ SOLN
1.0000 mg | Freq: Once | INTRAMUSCULAR | Status: AC
  Administered 2014-07-30: 1 mg via INTRAVENOUS
  Filled 2014-07-30: qty 1

## 2014-07-30 NOTE — Discharge Instructions (Signed)
If you were given medicines take as directed.  If you are on coumadin or contraceptives realize their levels and effectiveness is altered by many different medicines.  If you have any reaction (rash, tongues swelling, other) to the medicines stop taking and see a physician.   Please follow up as directed and return to the ER or see a physician for new or worsening symptoms especially leg weakness, bowel or bladder changes.  Thank you. Filed Vitals:   07/30/14 1546 07/30/14 1748  BP: 129/70 146/95  Pulse: 99 91  Temp: 98.8 F (37.1 C)   TempSrc: Oral   Resp: 18 20  Height: 6\' 3"  (1.905 m)   Weight: 180 lb (81.647 kg)   SpO2: 100% 98%

## 2014-07-30 NOTE — ED Provider Notes (Signed)
CSN: [id number]     Arrival date & time 07/30/14  1503 History   First MD Initiated Contact with Patient 07/30/14 1725     This chart was scribed for No att. providers found by Forrestine Him, ED Scribe. This patient was seen in room APA08/APA08 and the patient's care was started 1:25 AM.   Chief Complaint  Patient presents with  . Back Pain   The history is provided by the patient. No language interpreter was used.    HPI Comments: Erik [name] is a 68 y.o. male with a PMHx of DDD who presents to the Emergency Department complaining of constant, ongoing lower back pain that radiates down legs bilaterally x 2 days that is unchanged. Pt also reports pain to both arms. Pain is exacerbated with ambulation and mildly alleviated with rest. States he experienced SOB with exertion and ambulation. Pt was seen in the ED at time of onset and was told pain was related to arthritis. No recent fever, chills, abdominal pain, weakness, aphasia, weight loss, rash, CP, dizziness, visual changes, vomiting, diarrhea. No hip pain or neck pain. No bowel or urinary incontinence. He denies a history of cancer. No recent clotting to legs. No known allergies to medications.  Past Medical History  Diagnosis Date  . Seizures   . Chronic back pain   . DDD (degenerative disc disease), lumbar    History reviewed. No pertinent past surgical history. History reviewed. No pertinent family history. History  Substance Use Topics  . Smoking status: Never Smoker   . Smokeless tobacco: Not on file  . Alcohol Use: No    Review of Systems  Constitutional: Negative for fever and chills.  Eyes: Negative for visual disturbance.  Respiratory: Positive for shortness of breath.   Cardiovascular: Negative for chest pain and leg swelling.  Gastrointestinal: Negative for nausea, vomiting and abdominal pain.  Musculoskeletal: Positive for back pain. Negative for neck pain.  Skin: Negative for rash.  Neurological: Negative  for dizziness, weakness and numbness.  All other systems reviewed and are negative.     Allergies  Review of patient's allergies indicates no known allergies.  Home Medications   Prior to Admission medications   Medication Sig Start Date End Date Taking? Authorizing Provider  aspirin EC 81 MG tablet Take 81 mg by mouth daily.   Yes Historical Provider, MD  fludrocortisone (FLORINEF) 0.1 MG tablet Take 2 tablets (0.2 mg total) by mouth 2 (two) times daily. 07/17/14  Yes Estela Leonie Green, MD  midodrine (PROAMATINE) 10 MG tablet Take 1 tablet (10 mg total) by mouth 3 (three) times daily with meals. 07/17/14  Yes Estela Leonie Green, MD  orphenadrine (NORFLEX) 100 MG tablet Take 1 tablet (100 mg total) by mouth 2 (two) times daily. 07/27/14  Yes Charlesetta Shanks, MD  HYDROcodone-acetaminophen (NORCO) 5-325 MG per tablet Take 1-2 tablets by mouth every 4 (four) hours as needed. 07/30/14   Mariea Clonts, MD  traMADol (ULTRAM) 50 MG tablet Take 2 tablets (100 mg total) by mouth every 6 (six) hours as needed. Patient not taking: Reported on 07/30/2014 07/27/14   Charlesetta Shanks, MD   Triage Vitals: BP 146/95 mmHg  Pulse 91  Temp(Src) 98.8 F (37.1 C) (Oral)  Resp 20  Ht 6\' 3"  (1.905 m)  Wt 180 lb (81.647 kg)  BMI 22.50 kg/m2  SpO2 98%   Physical Exam  Constitutional: He is oriented to person, place, and time. He appears well-developed and well-nourished.  HENT:  Head: Normocephalic and atraumatic.  Eyes: EOM are normal.  Neck: Normal range of motion.  Cardiovascular: Normal rate, regular rhythm, normal heart sounds and intact distal pulses.   Pulses:      Dorsalis pedis pulses are 2+ on the right side, and 2+ on the left side.       Posterior tibial pulses are 2+ on the right side, and 2+ on the left side.  2 plus ulnar pulses  Pulmonary/Chest: Effort normal and breath sounds normal. No respiratory distress.  Abdominal: Soft. He exhibits no distension. There is no  tenderness.  Genitourinary:  Good rectal tone and sensation perianal   Musculoskeletal: Normal range of motion.  Paraspinal and midline tenderness  Neurological: He is alert and oriented to person, place, and time.  Reflex Scores:      Patellar reflexes are 1+ on the right side and 1+ on the left side.      Achilles reflexes are 1+ on the right side and 1+ on the left side. 5 plus strength F/L bilaterally to hips, knees and great toes Mild overall deceased strength likely due to pain Equal sensation to scratching to upper and lower extremities Cranial nerves intact  Skin: Skin is warm and dry.  Dry skin to both lower extremitites  Psychiatric: He has a normal mood and affect. Judgment normal.  Nursing note and vitals reviewed.   ED Course  Procedures (including critical care time)  DIAGNOSTIC STUDIES: Oxygen Saturation is 100% on RA, Normal by my interpretation.    COORDINATION OF CARE: 1:25 AM-Discussed treatment plan with pt at bedside and pt agreed to plan.     Labs Review Labs Reviewed  BASIC METABOLIC PANEL - Abnormal; Notable for the following:    GFR calc non Af Amer 61 (*)    GFR calc Af Amer 70 (*)    All other components within normal limits  CBC WITH DIFFERENTIAL - Abnormal; Notable for the following:    Hemoglobin 12.0 (*)    HCT 37.3 (*)    MCV 70.4 (*)    MCH 22.6 (*)    All other components within normal limits  TROPONIN I  PRO B NATRIURETIC PEPTIDE    Imaging Review Dg Chest 2 View  07/30/2014   CLINICAL DATA:  Difficulty breathing for 2 days and pain, primarily posteriorly  EXAM: CHEST  2 VIEW  COMPARISON:  July 07, 2014  FINDINGS: There is no edema or consolidation. There appears to be some underlying emphysematous change. Heart size is normal. The pulmonary vascularity is within normal limits. No adenopathy. There is slight degenerative change in the thoracic spine.  IMPRESSION: Suspect a degree of underlying emphysema. No edema or consolidation.    Electronically Signed   By: Lowella Grip M.D.   On: 07/30/2014 18:59     EKG Interpretation   Date/Time:  Monday July 30 2014 15:50:01 EST Ventricular Rate:  93 PR Interval:  142 QRS Duration: 96 QT Interval:  364 QTC Calculation: 452 R Axis:   -52 Text Interpretation:  Sinus rhythm with occasional Premature ventricular  complexes Left anterior fascicular block Abnormal ECG Confirmed by Colen Eltzroth   MD, Keilon Ressel ([id number]) on 07/30/2014 7:18:36 PM      MDM   Final diagnoses:  Dyspnea  Lumbar back pain with radiculopathy affecting left lower extremity   I personally performed the services described in this documentation, which was scribed in my presence. The recorded information has been reviewed and is accurate.   Patient presented primarily  for multiple joint aches and pains worse with movement. Patient has had nonspecific dyspnea sometimes with exertion sometimes at rest, no current dyspnea no current chest pain. Cardiac screen was done in ER, no acute findings. Patient improved on recheck are still has body aches.  Recent MRI lumbar reviewed no acute findings, discussed close follow-up with primary Dr. in orthopedics. Chest x-ray reviewed no acute findings Results and differential diagnosis were discussed with the patient/parent/guardian. Close follow up outpatient was discussed, comfortable with the plan.   Medications  HYDROmorphone (DILAUDID) injection 1 mg (1 mg Intravenous Given 07/30/14 1827)    Filed Vitals:   07/30/14 1546 07/30/14 1748  BP: 129/70 146/95  Pulse: 99 91  Temp: 98.8 F (37.1 C)   TempSrc: Oral   Resp: 18 20  Height: 6\' 3"  (1.905 m)   Weight: 180 lb (81.647 kg)   SpO2: 100% 98%    Final diagnoses:  Dyspnea  Lumbar back pain with radiculopathy affecting left lower extremity      Mariea Clonts, MD 07/31/14 0126

## 2014-07-30 NOTE — ED Notes (Addendum)
Pt reports lower back pain radiating to bilateral upper arms since saturday. Pt denies any recent injury/fall. Pt reports "this back pain does not feel like my chronic pain." Pt also reports increased in sob since Saturday. Pt denies any cp.

## 2014-08-21 DIAGNOSIS — I951 Orthostatic hypotension: Secondary | ICD-10-CM | POA: Diagnosis not present

## 2014-08-21 DIAGNOSIS — R0600 Dyspnea, unspecified: Secondary | ICD-10-CM | POA: Diagnosis not present

## 2014-08-21 DIAGNOSIS — G40909 Epilepsy, unspecified, not intractable, without status epilepticus: Secondary | ICD-10-CM | POA: Diagnosis not present

## 2014-08-21 DIAGNOSIS — M549 Dorsalgia, unspecified: Secondary | ICD-10-CM | POA: Diagnosis not present

## 2014-08-24 ENCOUNTER — Encounter: Payer: Self-pay | Admitting: Cardiology

## 2014-08-24 ENCOUNTER — Ambulatory Visit (INDEPENDENT_AMBULATORY_CARE_PROVIDER_SITE_OTHER): Payer: Medicare Other | Admitting: Cardiology

## 2014-08-24 VITALS — BP 143/82 | HR 78 | Ht 71.0 in | Wt 174.8 lb

## 2014-08-24 DIAGNOSIS — I951 Orthostatic hypotension: Secondary | ICD-10-CM | POA: Diagnosis not present

## 2014-08-24 NOTE — Progress Notes (Signed)
Clinical Summary Mr. Craton is a 69 y.o.male seen today for follow up, this is our first visit together.   1. Orthostatic hypotension - admit 07/2014 for presyncope - feelings of lightheadness primarily upon standing. Severe orthostatic changes during admission per notes. Started on midodrine and florinef. 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 Normal TSH and cortisol levels. Compression stockings prescribed. - reports dizziness is improved, no symptoms since discharge   Past Medical History  Diagnosis Date  . Seizures   . Chronic back pain   . DDD (degenerative disc disease), lumbar      No Known Allergies   Current Outpatient Prescriptions  Medication Sig Dispense Refill  . aspirin EC 81 MG tablet Take 81 mg by mouth daily.    . fludrocortisone (FLORINEF) 0.1 MG tablet Take 2 tablets (0.2 mg total) by mouth 2 (two) times daily. 60 tablet 3  . HYDROcodone-acetaminophen (NORCO) 5-325 MG per tablet Take 1-2 tablets by mouth every 4 (four) hours as needed. 6 tablet 0  . midodrine (PROAMATINE) 10 MG tablet Take 1 tablet (10 mg total) by mouth 3 (three) times daily with meals. 90 tablet 3  . orphenadrine (NORFLEX) 100 MG tablet Take 1 tablet (100 mg total) by mouth 2 (two) times daily. 30 tablet 0  . traMADol (ULTRAM) 50 MG tablet Take 2 tablets (100 mg total) by mouth every 6 (six) hours as needed. (Patient not taking: Reported on 07/30/2014) 30 tablet 0   No current facility-administered medications for this visit.     No past surgical history on file.   No Known Allergies    No family history on file.   Social History Mr. Bates reports that he quit smoking about 31 years ago. He does not have any smokeless tobacco history on file. Mr. Guggenheim reports that he does not drink alcohol.   Review of Systems CONSTITUTIONAL: No weight loss, fever, chills, weakness or  fatigue.  HEENT: Eyes: No visual loss, blurred vision, double vision or yellow sclerae.No hearing loss, sneezing, congestion, runny nose or sore throat.  SKIN: No rash or itching.  CARDIOVASCULAR: pe rHPI RESPIRATORY: No shortness of breath, cough or sputum.  GASTROINTESTINAL: No anorexia, nausea, vomiting or diarrhea. No abdominal pain or blood.  GENITOURINARY: No burning on urination, no polyuria NEUROLOGICAL: No headache, dizziness, syncope, paralysis, ataxia, numbness or tingling in the extremities. No change in bowel or bladder control.  MUSCULOSKELETAL: No muscle, back pain, joint pain or stiffness.  LYMPHATICS: No enlarged nodes. No history of splenectomy.  PSYCHIATRIC: No history of depression or anxiety.  ENDOCRINOLOGIC: No reports of sweating, cold or heat intolerance. No polyuria or polydipsia.  Marland Kitchen   Physical Examination There were no vitals filed for this visit. Filed Weights   08/24/14 1113  Weight: 174 lb 12.8 oz (79.289 kg)    Gen: resting comfortably, no acute distress HEENT: no scleral icterus, pupils equal round and reactive, no palptable cervical adenopathy,  CV: RRR, no m/r/g, no JVD Resp: Clear to auscultation bilaterally GI: abdomen is soft, non-tender, non-distended, normal bowel sounds, no hepatosplenomegaly MSK: extremities are warm, no edema.  Skin: warm, no rash Neuro:  no focal deficits Psych: appropriate affect   Diagnostic Studies 06/2014 Echo Study Conclusions  - Left ventricle: The cavity size was normal. Wall thickness was normal. Systolic function was normal. The estimated ejection fraction was in the range of 55% to 60%.  Wall motion was normal; there were no regional wall motion abnormalities. Doppler parameters are consistent with abnormal left ventricular relaxation (grade 1 diastolic dysfunction). - Aortic valve: Trileaflet; mildly thickened leaflets. - Mitral valve: Mildly thickened leaflets . There was  trivial regurgitation. - Right atrium: Central venous pressure (est): 3 mm Hg. - Atrial septum: No defect or patent foramen ovale was identified. - Tricuspid valve: There was mild regurgitation. - Pulmonary arteries: PA peak pressure: 34 mm Hg (S). - Pericardium, extracardiac: There was no pericardial effusion.  Impressions:  - Normal LV wall thickness with LVEF 51-83%, grade 1 diastolic dysfunction. Mildly thickened aortic valve. Trivial mitral and mild regurgitation. PASP 34 mmHg.    Assessment and Plan   1. Orthostatic symptoms - symptoms resolved on current meds. Continue midodrine and florinef. Encouraged to maintain adequate hydration and salt intake - f/u with neuro for further neuropathy workup  F/u 1 year     Arnoldo Lenis, M.D.

## 2014-08-24 NOTE — Patient Instructions (Signed)

## 2014-09-06 ENCOUNTER — Other Ambulatory Visit: Payer: Self-pay | Admitting: Neurology

## 2014-09-06 DIAGNOSIS — R42 Dizziness and giddiness: Secondary | ICD-10-CM | POA: Diagnosis not present

## 2014-09-06 DIAGNOSIS — R2681 Unsteadiness on feet: Secondary | ICD-10-CM | POA: Diagnosis not present

## 2014-09-06 DIAGNOSIS — I951 Orthostatic hypotension: Secondary | ICD-10-CM | POA: Diagnosis not present

## 2014-09-06 DIAGNOSIS — M545 Low back pain: Secondary | ICD-10-CM | POA: Diagnosis not present

## 2014-09-06 DIAGNOSIS — M5412 Radiculopathy, cervical region: Secondary | ICD-10-CM

## 2014-09-12 DIAGNOSIS — R270 Ataxia, unspecified: Secondary | ICD-10-CM | POA: Diagnosis not present

## 2014-09-12 DIAGNOSIS — M5416 Radiculopathy, lumbar region: Secondary | ICD-10-CM | POA: Diagnosis not present

## 2014-09-12 DIAGNOSIS — G609 Hereditary and idiopathic neuropathy, unspecified: Secondary | ICD-10-CM | POA: Diagnosis not present

## 2014-09-13 ENCOUNTER — Ambulatory Visit (HOSPITAL_COMMUNITY)
Admission: RE | Admit: 2014-09-13 | Discharge: 2014-09-13 | Disposition: A | Discharge status: Home / Self Care | Payer: Medicare Other | Source: Ambulatory Visit | Attending: Neurology | Admitting: Neurology

## 2014-09-13 ENCOUNTER — Encounter (HOSPITAL_COMMUNITY): Payer: Self-pay

## 2014-09-13 DIAGNOSIS — M5412 Radiculopathy, cervical region: Secondary | ICD-10-CM | POA: Diagnosis not present

## 2014-09-13 DIAGNOSIS — M4802 Spinal stenosis, cervical region: Secondary | ICD-10-CM | POA: Diagnosis not present

## 2014-09-28 DIAGNOSIS — J439 Emphysema, unspecified: Secondary | ICD-10-CM | POA: Diagnosis not present

## 2014-09-28 DIAGNOSIS — R630 Anorexia: Secondary | ICD-10-CM | POA: Diagnosis not present

## 2014-09-28 DIAGNOSIS — M5030 Other cervical disc degeneration, unspecified cervical region: Secondary | ICD-10-CM | POA: Diagnosis not present

## 2014-09-28 DIAGNOSIS — D649 Anemia, unspecified: Secondary | ICD-10-CM | POA: Diagnosis not present

## 2014-09-28 DIAGNOSIS — R634 Abnormal weight loss: Secondary | ICD-10-CM | POA: Diagnosis not present

## 2014-10-08 ENCOUNTER — Encounter: Payer: Self-pay | Admitting: Gastroenterology

## 2014-10-16 DIAGNOSIS — R739 Hyperglycemia, unspecified: Secondary | ICD-10-CM | POA: Diagnosis not present

## 2014-10-16 DIAGNOSIS — I951 Orthostatic hypotension: Secondary | ICD-10-CM | POA: Diagnosis not present

## 2014-10-16 DIAGNOSIS — R2681 Unsteadiness on feet: Secondary | ICD-10-CM | POA: Diagnosis not present

## 2014-10-16 DIAGNOSIS — G6181 Chronic inflammatory demyelinating polyneuritis: Secondary | ICD-10-CM | POA: Diagnosis not present

## 2014-10-26 ENCOUNTER — Ambulatory Visit (INDEPENDENT_AMBULATORY_CARE_PROVIDER_SITE_OTHER): Payer: Medicare Other | Admitting: Gastroenterology

## 2014-10-26 ENCOUNTER — Encounter: Payer: Self-pay | Admitting: Gastroenterology

## 2014-10-26 ENCOUNTER — Other Ambulatory Visit: Payer: Self-pay

## 2014-10-26 VITALS — BP 152/86 | HR 92 | Temp 98.2°F | Ht 72.0 in | Wt 169.6 lb

## 2014-10-26 DIAGNOSIS — D649 Anemia, unspecified: Secondary | ICD-10-CM

## 2014-10-26 DIAGNOSIS — R1314 Dysphagia, pharyngoesophageal phase: Secondary | ICD-10-CM | POA: Diagnosis not present

## 2014-10-26 DIAGNOSIS — R634 Abnormal weight loss: Secondary | ICD-10-CM

## 2014-10-26 LAB — IRON AND TIBC
%SAT: 23 % (ref 20–55)
IRON: 61 ug/dL (ref 42–165)
TIBC: 268 ug/dL (ref 215–435)
UIBC: 207 ug/dL (ref 125–400)

## 2014-10-26 MED ORDER — PEG 3350-KCL-NA BICARB-NACL 420 G PO SOLR: 4000.0000 mL | ORAL | Status: DC

## 2014-10-26 NOTE — Assessment & Plan Note (Signed)
Intermittent solid food dysphagia, no odynophagia. Concern for web, ring, stricture, occult malignancy. Dilation as appropriate at time of EGD with Dr. Oneida Alar.

## 2014-10-26 NOTE — Assessment & Plan Note (Addendum)
69 year old male presenting with significant weight loss, associated loss of appetite and lack of taste, with no prior colonoscopy or upper endoscopy. No overt signs of GI bleeding. Concern for occult malignancy. CT without significant findings.   Proceed with colonoscopy/EGD/ED with Dr. Oneida Alar in the near future. The risks, benefits, and alternatives have been discussed in detail with the patient. They state understanding and desire to proceed.  Phenergan 12.5 mg IV on call due to history of ETOH abuse

## 2014-10-26 NOTE — Assessment & Plan Note (Signed)
No overt signs of GI bleeding. Unknown hemoccult status. Check iron, ferritin, TIBC. Daughter with history of sickle cell anemia. Check sickle cell screen today as well.

## 2014-10-26 NOTE — Progress Notes (Signed)
Primary Care Physician:  Erik Simmer, MD Primary Gastroenterologist:  Dr. Oneida Alar   Chief Complaint  Patient presents with  . loss of weight  . loss of appetite    HPI:   Erik [name] is a 69 y.o. male presenting today at the request of his PCP secondary to weight loss and loss of appetite. He has never had a colonoscopy or upper endoscopy.   Was weighing around 200 just before Oct 2015. Hadn't gone to the doctor in years, but went to the doctor due to lower back discomfort. Now 169. Decreased appetite since Oct 2015, attributes it to medication, stating when he started taking medication he lost his appetite. Denies early satiety. Food has no taste.    States used to have a BM every morning, but now only goes sporadically. States ate only once this week, had one BM this week. Doesn't feel constipated. No change in stool caliber. Denies abdominal pain. No nausea, vomiting. No reflux symptoms. Intermittent solid food dysphagia.  [name], daughter present with patient, also a patient of Dr. Oneida Alar.   Past Medical History  Diagnosis Date  . Seizures     in remote past   . Chronic back pain   . DDD (degenerative disc disease), lumbar     Past Surgical History  Procedure Laterality Date  . None      Current Outpatient Prescriptions  Medication Sig Dispense Refill  . aspirin EC 81 MG tablet Take 81 mg by mouth daily.    . fludrocortisone (FLORINEF) 0.1 MG tablet Take 2 tablets (0.2 mg total) by mouth 2 (two) times daily. 60 tablet 3  . HYDROcodone-acetaminophen (NORCO) 5-325 MG per tablet Take 1-2 tablets by mouth every 4 (four) hours as needed. 6 tablet 0  . midodrine (PROAMATINE) 10 MG tablet Take 1 tablet (10 mg total) by mouth 3 (three) times daily with meals. 90 tablet 3  . omeprazole (PRILOSEC) 20 MG capsule Take 20 mg by mouth daily.    Marland Kitchen tiotropium (SPIRIVA) 18 MCG inhalation capsule Place 18 mcg into inhaler and inhale daily.     No current  facility-administered medications for this visit.    Allergies as of 10/26/2014  . (No Known Allergies)    Family History  Problem Relation Age of Onset  . Colon cancer Neg Hx   . Stomach cancer Maternal Grandfather   . Sickle cell anemia Daughter     History   Social History  . Marital Status: Legally Separated    Spouse Name: N/A  . Number of Children: N/A  . Years of Education: N/A   Occupational History  . Not on file.   Social History Main Topics  . Smoking status: Former Smoker    Quit date: 08/11/1983  . Smokeless tobacco: Not on file  . Alcohol Use: 0.0 oz/week    0 Standard drinks or equivalent per week     Comment: remote past, history of ETOH abuse.   . Drug Use: No  . Sexual Activity: Not on file   Other Topics Concern  . Not on file   Social History Narrative    Review of Systems: Gen: see HPI CV: Denies chest pain, heart palpitations, peripheral edema, syncope.  Resp: +DOE GI: see HPI GU : Denies urinary burning, urinary frequency, urinary hesitancy MS: +lower back pain Derm: Denies rash, itching, dry skin Psych: Denies depression, anxiety, memory loss, and confusion Heme: Denies bruising, bleeding, and enlarged lymph nodes.  Physical  Exam: BP 152/86 mmHg  Pulse 92  Temp(Src) 98.2 F (36.8 C)  Ht 6' (1.829 m)  Wt 169 lb 9.6 oz (76.93 kg)  BMI 23.00 kg/m2 General:   Alert and oriented. Pleasant and cooperative. Face thin.  Head:  Normocephalic and atraumatic. Eyes:  Without icterus, sclera clear and conjunctiva pink.  Ears:  Normal auditory acuity. Nose:  No deformity, discharge,  or lesions. Mouth:  No deformity or lesions, oral mucosa pink.  Lungs:  Clear to auscultation bilaterally. No wheezes, rales, or rhonchi. No distress.  Heart:  S1, S2 present without murmurs appreciated.  Abdomen:  +BS, soft, non-tender and non-distended. No HSM noted. No guarding or rebound. No masses appreciated.  Rectal:  Deferred  Msk:  Symmetrical  without gross deformities. Normal posture. Extremities:  Without edema. Neurologic:  Alert and  oriented x4;  grossly normal neurologically. Skin:  Intact without significant lesions or rashes. Psych:  Alert and cooperative. Normal mood and affect.   Lab Results  Component Value Date   WBC 9.0 07/30/2014   HGB 12.0* 07/30/2014   HCT 37.3* 07/30/2014   MCV 70.4* 07/30/2014   PLT 256 07/30/2014

## 2014-10-26 NOTE — Patient Instructions (Signed)
Please complete blood work today.   We have scheduled you for a colonoscopy, upper endoscopy, and dilation with Dr. Oneida Alar.   Use Boost or ensure at least twice a day.

## 2014-10-27 LAB — FERRITIN: FERRITIN: 153 ng/mL (ref 22–322)

## 2014-10-29 ENCOUNTER — Telehealth: Payer: Self-pay | Admitting: Gastroenterology

## 2014-10-29 NOTE — Telephone Encounter (Signed)
AS seen patient on 10/26/14 and had requested hemocult stool results from PCP.  I faxed request to [name] and they said that they do not have any hemocult stool results on file.

## 2014-10-30 NOTE — Progress Notes (Signed)
CC'ED TO PCP 

## 2014-11-07 DIAGNOSIS — B370 Candidal stomatitis: Secondary | ICD-10-CM | POA: Diagnosis not present

## 2014-11-07 DIAGNOSIS — I951 Orthostatic hypotension: Secondary | ICD-10-CM | POA: Diagnosis not present

## 2014-11-07 DIAGNOSIS — J449 Chronic obstructive pulmonary disease, unspecified: Secondary | ICD-10-CM | POA: Diagnosis not present

## 2014-11-07 DIAGNOSIS — M549 Dorsalgia, unspecified: Secondary | ICD-10-CM | POA: Diagnosis not present

## 2014-11-07 NOTE — Progress Notes (Signed)
Quick Note:  No evidence of IDA. Ferritin normal, iron normal. TIBC normal. Anemia of chronic disease. Where is sickle cell screen? ______

## 2014-11-08 NOTE — Progress Notes (Signed)
Quick Note:  I called [facility] consultant and they said sickle cell screen was not done. Spoke to [place] and she said if it was on a separate order, and was possible over looked by the tech. I called [facility] and spoke to [place], and she said she was the one that drew his blood,and she would call and see if it could be added on,  She called and called me back and said it is too late, but said that she would call the pt to come in and have that drawn since it was her error.  I will give her time to call pt and I will call with these results. ______

## 2014-11-08 NOTE — Progress Notes (Signed)
Quick Note:  Per [place], she was unable to reach pt but she has left a message for him to call her. She checked with her supervisor and there will not be a charge to draw the sicke cell screen lab.  I tried to call pt also. His phone not working, and I LMOM at his emergency contact for a return call for lab results.  I also mailed him a letter to call. ______

## 2014-11-10 DIAGNOSIS — D649 Anemia, unspecified: Secondary | ICD-10-CM | POA: Diagnosis not present

## 2014-11-11 LAB — SICKLE CELL SCREEN: Sickle Cell Screen: POSITIVE — AB

## 2014-11-12 ENCOUNTER — Ambulatory Visit (HOSPITAL_COMMUNITY)
Admission: RE | Admit: 2014-11-12 | Discharge: 2014-11-12 | Disposition: A | Discharge status: Home / Self Care | Payer: Medicare Other | Source: Ambulatory Visit | Attending: Gastroenterology | Admitting: Gastroenterology

## 2014-11-12 ENCOUNTER — Encounter (HOSPITAL_COMMUNITY)
Admission: RE | Disposition: A | Discharge status: Home / Self Care | Payer: Self-pay | Source: Ambulatory Visit | Attending: Gastroenterology

## 2014-11-12 ENCOUNTER — Encounter (HOSPITAL_COMMUNITY): Payer: Self-pay | Admitting: *Deleted

## 2014-11-12 DIAGNOSIS — Z87891 Personal history of nicotine dependence: Secondary | ICD-10-CM | POA: Diagnosis not present

## 2014-11-12 DIAGNOSIS — Z1211 Encounter for screening for malignant neoplasm of colon: Secondary | ICD-10-CM | POA: Diagnosis not present

## 2014-11-12 DIAGNOSIS — K644 Residual hemorrhoidal skin tags: Secondary | ICD-10-CM | POA: Insufficient documentation

## 2014-11-12 DIAGNOSIS — D509 Iron deficiency anemia, unspecified: Secondary | ICD-10-CM | POA: Insufficient documentation

## 2014-11-12 DIAGNOSIS — K648 Other hemorrhoids: Secondary | ICD-10-CM | POA: Insufficient documentation

## 2014-11-12 DIAGNOSIS — Z7982 Long term (current) use of aspirin: Secondary | ICD-10-CM | POA: Insufficient documentation

## 2014-11-12 DIAGNOSIS — Z7952 Long term (current) use of systemic steroids: Secondary | ICD-10-CM | POA: Diagnosis not present

## 2014-11-12 DIAGNOSIS — Z79891 Long term (current) use of opiate analgesic: Secondary | ICD-10-CM | POA: Insufficient documentation

## 2014-11-12 DIAGNOSIS — D649 Anemia, unspecified: Secondary | ICD-10-CM

## 2014-11-12 DIAGNOSIS — G8929 Other chronic pain: Secondary | ICD-10-CM | POA: Diagnosis not present

## 2014-11-12 DIAGNOSIS — M549 Dorsalgia, unspecified: Secondary | ICD-10-CM | POA: Diagnosis not present

## 2014-11-12 DIAGNOSIS — K2970 Gastritis, unspecified, without bleeding: Secondary | ICD-10-CM

## 2014-11-12 DIAGNOSIS — Z79899 Other long term (current) drug therapy: Secondary | ICD-10-CM | POA: Diagnosis not present

## 2014-11-12 DIAGNOSIS — R634 Abnormal weight loss: Secondary | ICD-10-CM | POA: Diagnosis not present

## 2014-11-12 DIAGNOSIS — J449 Chronic obstructive pulmonary disease, unspecified: Secondary | ICD-10-CM | POA: Diagnosis not present

## 2014-11-12 DIAGNOSIS — R1314 Dysphagia, pharyngoesophageal phase: Secondary | ICD-10-CM | POA: Diagnosis not present

## 2014-11-12 DIAGNOSIS — K2950 Unspecified chronic gastritis without bleeding: Secondary | ICD-10-CM | POA: Diagnosis not present

## 2014-11-12 DIAGNOSIS — R1310 Dysphagia, unspecified: Secondary | ICD-10-CM | POA: Diagnosis present

## 2014-11-12 HISTORY — PX: ESOPHAGOGASTRODUODENOSCOPY: SHX5428

## 2014-11-12 HISTORY — PX: ESOPHAGEAL DILATION: SHX303

## 2014-11-12 HISTORY — DX: Reserved for inherently not codable concepts without codable children: IMO0001

## 2014-11-12 HISTORY — DX: Chronic obstructive pulmonary disease, unspecified: J44.9

## 2014-11-12 HISTORY — PX: COLONOSCOPY: SHX5424

## 2014-11-12 SURGERY — COLONOSCOPY
Anesthesia: Moderate Sedation

## 2014-11-12 MED ORDER — MINERAL OIL PO OIL
TOPICAL_OIL | ORAL | Status: AC
  Filled 2014-11-12: qty 30

## 2014-11-12 MED ORDER — LIDOCAINE VISCOUS 2 % MT SOLN
OROMUCOSAL | Status: DC | PRN
  Administered 2014-11-12: 3 mL via OROMUCOSAL

## 2014-11-12 MED ORDER — SODIUM CHLORIDE 0.9 % IV SOLN
INTRAVENOUS | Status: DC
  Administered 2014-11-12: 10:00:00 via INTRAVENOUS

## 2014-11-12 MED ORDER — MIDAZOLAM HCL 5 MG/5ML IJ SOLN
INTRAMUSCULAR | Status: AC
  Filled 2014-11-12: qty 10

## 2014-11-12 MED ORDER — PROMETHAZINE HCL 25 MG/ML IJ SOLN
INTRAMUSCULAR | Status: AC
  Filled 2014-11-12: qty 1

## 2014-11-12 MED ORDER — SODIUM CHLORIDE 0.9 % IJ SOLN
INTRAMUSCULAR | Status: AC
  Filled 2014-11-12: qty 3

## 2014-11-12 MED ORDER — MIDAZOLAM HCL 5 MG/5ML IJ SOLN
INTRAMUSCULAR | Status: DC | PRN
  Administered 2014-11-12: 1 mg via INTRAVENOUS
  Administered 2014-11-12 (×2): 2 mg via INTRAVENOUS

## 2014-11-12 MED ORDER — STERILE WATER FOR IRRIGATION IR SOLN
Status: DC | PRN
  Administered 2014-11-12: 11:00:00

## 2014-11-12 MED ORDER — MEPERIDINE HCL 100 MG/ML IJ SOLN
INTRAMUSCULAR | Status: DC | PRN
  Administered 2014-11-12 (×3): 25 mg via INTRAVENOUS

## 2014-11-12 MED ORDER — LIDOCAINE VISCOUS 2 % MT SOLN
OROMUCOSAL | Status: AC
  Filled 2014-11-12: qty 15

## 2014-11-12 MED ORDER — MEPERIDINE HCL 100 MG/ML IJ SOLN
INTRAMUSCULAR | Status: AC
  Filled 2014-11-12: qty 2

## 2014-11-12 MED ORDER — PROMETHAZINE HCL 25 MG/ML IJ SOLN
12.5000 mg | Freq: Once | INTRAMUSCULAR | Status: AC
  Administered 2014-11-12: 12.5 mg via INTRAVENOUS

## 2014-11-12 NOTE — Progress Notes (Signed)
REVIEWED-NO ADDITIONAL RECOMMENDATIONS. 

## 2014-11-12 NOTE — Op Note (Signed)
[place], [address]   COLONOSCOPY PROCEDURE REPORT  PATIENT: Erik [name], Erik [name]  MR#: [id number] BIRTHDATE: [birth year] , 5  yrs. old GENDER: male ENDOSCOPIST: Danie Binder, MD REFERRED BX:IDHWYSHU Roberson, MD PROCEDURE DATE:  2014-11-14 PROCEDURE:   Colonoscopy, diagnostic INDICATIONS:weight loss and dysphagia-DEC 2015 Hb 12, MCV 70, GFR 70-MAR 2016 POS SICKLE CELL SCREEN, FERRITIN 153, . MEDICATIONS: Versed 5 mg IV and Demerol 50 mg IV  DESCRIPTION OF PROCEDURE:    Physical exam was performed.  Informed consent was obtained from the patient after explaining the benefits, risks, and alternatives to procedure.  The patient was connected to monitor and placed in left lateral position. Continuous oxygen was provided by nasal cannula and IV medicine administered through an indwelling cannula.  After administration of sedation and rectal exam, the patients rectum was intubated and the EC-[id number] ([id number])  colonoscope was advanced under direct visualization to the cecum.  The scope was removed slowly by carefully examining the color, texture, anatomy, and integrity mucosa on the way out.  The patient was recovered in endoscopy and discharged home in satisfactory condition.    COLON FINDINGS: The LEFT colon IS redundant.  Manual abdominal counter-pressure was used to reach the cecum. NO POLYPS OR DIVERTICULOSIS.  The patient was moved on to their back to reach the cecum and Small external and internal hemorrhoids were found.   PREP QUALITY: good WITH IRRIGATION.  CECAL W/D TIME: 23 minutes   COMPLICATIONS: None  ENDOSCOPIC IMPRESSION: 1.   The LEFT colon IS redundant 2.   Small external and internal hemorrhoids 3. NO SOURCE FOR WEIGHT LOSS IDENTIFIED  RECOMMENDATIONS: HIGH FIBER DIET NEXT TCS IN 10-15 YEARS IF THE BENEFITS OUTWEIGH THE RISKS      _______________________________ eSignedDanie Binder, MD 11/14/2014 12:07  PM    CPT CODES: ICD CODES:  The ICD and CPT codes recommended by this software are interpretations from the data that the clinical staff has captured with the software.  The verification of the translation of this report to the ICD and CPT codes and modifiers is the sole responsibility of the health care institution and practicing physician where this report was generated.  [employer] will not be held responsible for the validity of the ICD and CPT codes included on this report.  AMA assumes no liability for data contained or not contained herein. CPT is a Designer, television/film set of the [employer].

## 2014-11-12 NOTE — Discharge Instructions (Signed)
YOU DID NOT HAVE ANY POLYPS. YOU HAVE HEMORRHOIDS. YOU HAVE GASTRITIS. I STRETCHED YOUR ESOPHAGUS DUE YOUR PROBLEMS SWALLOWING. I DID NOT SEE A DEFINITE STRICTURE. I BIOPSIED YOUR STOMACH.   CONTINUE OMEPRAZOLE 30  MINUTES PRIOR TO YOUR FIRST MEAL.   FOLLOW A HIGH FIBER/LOW FAT DIET. AVOID ITEMS THAT CAUSE BLOATING. SEE INFO BELOW.  YOUR BIOPSY RESULTS WILL BE AVAILABLE IN MY CHART AFTER APR 6 AND MY OFFICE WILL CONTACT YOU IN 10-14 DAYS WITH YOUR RESULTS.   Follow up in 4 mos.  Next colonoscopy in 10-15 years IF THE BENEFITS OUTWEIGH THE RISKS.   ENDOSCOPY Care After Read the instructions outlined below and refer to this sheet in the next week. These discharge instructions provide you with general information on caring for yourself after you leave the hospital. While your treatment has been planned according to the most current medical practices available, unavoidable complications occasionally occur. If you have any problems or questions after discharge, call DR. Ramie Erman, [phone].  ACTIVITY  You may resume your regular activity, but move at a slower pace for the next 24 hours.   Take frequent rest periods for the next 24 hours.   Walking will help get rid of the air and reduce the bloated feeling in your belly (abdomen).   No driving for 24 hours (because of the medicine (anesthesia) used during the test).   You may shower.   Do not sign any important legal documents or operate any machinery for 24 hours (because of the anesthesia used during the test).    NUTRITION  Drink plenty of fluids.   You may resume your normal diet as instructed by your doctor.   Begin with a light meal and progress to your normal diet. Heavy or fried foods are harder to digest and may make you feel sick to your stomach (nauseated).   Avoid alcoholic beverages for 24 hours or as instructed.    MEDICATIONS  You may resume your normal medications.   WHAT YOU CAN EXPECT TODAY  Some  feelings of bloating in the abdomen.   Passage of more gas than usual.   Spotting of blood in your stool or on the toilet paper  .  IF YOU HAD POLYPS REMOVED DURING THE ENDOSCOPY:  Eat a soft diet IF YOU HAVE NAUSEA, BLOATING, ABDOMINAL PAIN, OR VOMITING.    FINDING OUT THE RESULTS OF YOUR TEST Not all test results are available during your visit. DR. Oneida Alar WILL CALL YOU WITHIN 14 DAYS OF YOUR PROCEDUE WITH YOUR RESULTS. Do not assume everything is normal if you have not heard from DR. Wayde Gopaul, CALL HER OFFICE AT [phone].  SEEK IMMEDIATE MEDICAL ATTENTION AND CALL THE OFFICE: [phone] IF:  You have more than a spotting of blood in your stool.   Your belly is swollen (abdominal distention).   You are nauseated or vomiting.   You have a temperature over 101F.   You have abdominal pain or discomfort that is severe or gets worse throughout the day.  Low-Fat Diet BREADS, CEREALS, PASTA, RICE, DRIED PEAS, AND BEANS These products are high in carbohydrates and most are low in fat. Therefore, they can be increased in the diet as substitutes for fatty foods. They too, however, contain calories and should not be eaten in excess. Cereals can be eaten for snacks as well as for breakfast.   FRUITS AND VEGETABLES It is good to eat fruits and vegetables. Besides being sources of fiber, both are rich in vitamins and  some minerals. They help you get the daily allowances of these nutrients. Fruits and vegetables can be used for snacks and desserts.  MEATS Limit lean meat, chicken, [place], and fish to no more than 6 ounces per day. Beef, Pork, and Lamb Use lean cuts of beef, pork, and lamb. Lean cuts include:  Extra-lean ground beef.  Arm roast.  Sirloin tip.  Center-cut ham.  Round steak.  Loin chops.  Rump roast.  Tenderloin.  Trim all fat off the outside of meats before cooking. It is not necessary to severely decrease the intake of red meat, but lean choices should be made.  Lean meat is rich in protein and contains a highly absorbable form of iron. Premenopausal women, in particular, should avoid reducing lean red meat because this could increase the risk for low red blood cells (iron-deficiency anemia).  Chicken and [place] These are good sources of protein. The fat of poultry can be reduced by removing the skin and underlying fat layers before cooking. Chicken and [place] can be substituted for lean red meat in the diet. Poultry should not be fried or covered with high-fat sauces. Fish and Shellfish Fish is a good source of protein. Shellfish contain cholesterol, but they usually are low in saturated fatty acids. The preparation of fish is important. Like chicken and [place], they should not be fried or covered with high-fat sauces. EGGS Egg whites contain no fat or cholesterol. They can be eaten often. Try 1 to 2 egg whites instead of whole eggs in recipes or use egg substitutes that do not contain yolk. MILK AND DAIRY PRODUCTS Use skim or 1% milk instead of 2% or whole milk. Decrease whole milk, natural, and processed cheeses. Use nonfat or low-fat (2%) cottage cheese or low-fat cheeses made from vegetable oils. Choose nonfat or low-fat (1 to 2%) yogurt. Experiment with evaporated skim milk in recipes that call for heavy cream. Substitute low-fat yogurt or low-fat cottage cheese for sour cream in dips and salad dressings. Have at least 2 servings of low-fat dairy products, such as 2 glasses of skim (or 1%) milk each day to help get your daily calcium intake. FATS AND OILS Reduce the total intake of fats, especially saturated fat. Butterfat, lard, and beef fats are high in saturated fat and cholesterol. These should be avoided as much as possible. Vegetable fats do not contain cholesterol, but certain vegetable fats, such as coconut oil, palm oil, and palm kernel oil are very high in saturated fats. These should be limited. These fats are often used in bakery goods,  processed foods, popcorn, oils, and nondairy creamers. Vegetable shortenings and some peanut butters contain hydrogenated oils, which are also saturated fats. Read the labels on these foods and check for saturated vegetable oils. Unsaturated vegetable oils and fats do not raise blood cholesterol. However, they should be limited because they are fats and are high in calories. Total fat should still be limited to 30% of your daily caloric intake. Desirable liquid vegetable oils are corn oil, cottonseed oil, olive oil, canola oil, safflower oil, soybean oil, and sunflower oil. Peanut oil is not as good, but small amounts are acceptable. Buy a heart-healthy tub margarine that has no partially hydrogenated oils in the ingredients. Mayonnaise and salad dressings often are made from unsaturated fats, but they should also be limited because of their high calorie and fat content. Seeds, nuts, peanut butter, olives, and avocados are high in fat, but the fat is mainly the unsaturated type. These foods  should be limited mainly to avoid excess calories and fat. OTHER EATING TIPS Snacks  Most sweets should be limited as snacks. They tend to be rich in calories and fats, and their caloric content outweighs their nutritional value. Some good choices in snacks are graham crackers, melba toast, soda crackers, bagels (no egg), English muffins, fruits, and vegetables. These snacks are preferable to snack crackers, [place] fries, TORTILLA CHIPS, and POTATO chips. Popcorn should be air-popped or cooked in small amounts of liquid vegetable oil. Desserts Eat fruit, low-fat yogurt, and fruit ices instead of pastries, cake, and cookies. Sherbet, angel food cake, gelatin dessert, frozen low-fat yogurt, or other frozen products that do not contain saturated fat (pure fruit juice bars, frozen ice pops) are also acceptable.  COOKING METHODS Choose those methods that use little or no fat. They include: Poaching.  Braising.  Steaming.    Grilling.  Baking.  Stir-frying.  Broiling.  Microwaving.  Foods can be cooked in a nonstick pan without added fat, or use a nonfat cooking spray in regular cookware. Limit fried foods and avoid frying in saturated fat. Add moisture to lean meats by using water, broth, cooking wines, and other nonfat or low-fat sauces along with the cooking methods mentioned above. Soups and stews should be chilled after cooking. The fat that forms on top after a few hours in the refrigerator should be skimmed off. When preparing meals, avoid using excess salt. Salt can contribute to raising blood pressure in some people.  EATING AWAY FROM HOME Order entres, potatoes, and vegetables without sauces or butter. When meat exceeds the size of a deck of cards (3 to 4 ounces), the rest can be taken home for another meal. Choose vegetable or fruit salads and ask for low-calorie salad dressings to be served on the side. Use dressings sparingly. Limit high-fat toppings, such as bacon, crumbled eggs, cheese, sunflower seeds, and olives. Ask for heart-healthy tub margarine instead of butter.  High-Fiber Diet A high-fiber diet changes your normal diet to include more whole grains, legumes, fruits, and vegetables. Changes in the diet involve replacing refined carbohydrates with unrefined foods. The calorie level of the diet is essentially unchanged. The Dietary Reference Intake (recommended amount) for adult males is 38 grams per day. For adult females, it is 25 grams per day. Pregnant and lactating women should consume 28 grams of fiber per day. Fiber is the intact part of a plant that is not broken down during digestion. Functional fiber is fiber that has been isolated from the plant to provide a beneficial effect in the body. PURPOSE  Increase stool bulk.   Ease and regulate bowel movements.   Lower cholesterol.  INDICATIONS THAT YOU NEED MORE FIBER  Constipation and hemorrhoids.   Uncomplicated diverticulosis  (intestine condition) and irritable bowel syndrome.   Weight management.   As a protective measure against hardening of the arteries (atherosclerosis), diabetes, and cancer.   GUIDELINES FOR INCREASING FIBER IN THE DIET  Start adding fiber to the diet slowly. A gradual increase of about 5 more grams (2 slices of whole-wheat bread, 2 servings of most fruits or vegetables, or 1 bowl of high-fiber cereal) per day is best. Too rapid an increase in fiber may result in constipation, flatulence, and bloating.   Drink enough water and fluids to keep your urine clear or pale yellow. Water, juice, or caffeine-free drinks are recommended. Not drinking enough fluid may cause constipation.   Eat a variety of high-fiber foods rather than one type  of fiber.   Try to increase your intake of fiber through using high-fiber foods rather than fiber pills or supplements that contain small amounts of fiber.   The goal is to change the types of food eaten. Do not supplement your present diet with high-fiber foods, but replace foods in your present diet.  INCLUDE A VARIETY OF FIBER SOURCES  Replace refined and processed grains with whole grains, canned fruits with fresh fruits, and incorporate other fiber sources. White rice, white breads, and most bakery goods contain little or no fiber.   Brown whole-grain rice, buckwheat oats, and many fruits and vegetables are all good sources of fiber. These include: broccoli, Brussels sprouts, cabbage, cauliflower, beets, sweet potatoes, white potatoes (skin on), carrots, tomatoes, eggplant, squash, berries, fresh fruits, and dried fruits.   Cereals appear to be the richest source of fiber. Cereal fiber is found in whole grains and bran. Bran is the fiber-rich outer coat of cereal grain, which is largely removed in refining. In whole-grain cereals, the bran remains. In breakfast cereals, the largest amount of fiber is found in those with "bran" in their names. The fiber content  is sometimes indicated on the label.   You may need to include additional fruits and vegetables each day.   In baking, for 1 cup white flour, you may use the following substitutions:   1 cup whole-wheat flour minus 2 tablespoons.   1/2 cup white flour plus 1/2 cup whole-wheat flour.     REFLUX SYMPTOMS Common symptoms of GERD are heartburn (burning in your chest). This is worse when lying down or bending over. It may also cause belching and indigestion. Some of the things which make GERD worse are:  Increased weight pushes on stomach making acid rise more easily.   Smoking markedly increases acid production.   Alcohol decreases lower esophageal sphincter pressure (valve between stomach and esophagus), allowing acid from stomach into esophagus.   Late evening meals and going to bed with a full stomach increases pressure.   Anything that causes an increase in acid production.    HOME CARE INSTRUCTIONS  Try to achieve and maintain an ideal body weight.   Avoid drinking alcoholic beverages.   DO NOT smokE.   Do not wear tight clothing around your chest or stomach.   Eat smaller meals and eat more frequently. This keeps your stomach from getting too full. Eat slowly.   Do not lie down for 2 or 3 hours after eating. Do not eat or drink anything 1 to 2 hours before going to bed.   Avoid caffeine beverages (colas, coffee, cocoa, tea), fatty foods, citrus fruits and all other foods and drinks that contain acid and that seem to increase the problems.   Avoid bending over, especially after eating OR STRAINING. Anything that increases the pressure in your belly increases the amount of acid that may be pushed up into your esophagus.   Gastritis  Gastritis is an inflammation (the body's way of reacting to injury and/or infection) of the stomach. It is often caused by viral or bacterial (germ) infections. It can also be caused BY ASPIRIN, BC/GOODY POWDER'S, (IBUPROFEN) MOTRIN, OR  ALEVE (NAPROXEN), chemicals (including alcohol), SPICY FOODS, and medications. This illness may be associated with generalized malaise (feeling tired, not well), UPPER ABDOMINAL STOMACH cramps, and fever. One common bacterial cause of gastritis is an organism known as H. Pylori. This can be treated with antibiotics.

## 2014-11-12 NOTE — H&P (Signed)
  Primary Care Physician:  [name], MD Primary Gastroenterologist:  Dr. Oneida Alar  Pre-Procedure History & Physical: HPI:  Erik [name] is a 69 y.o. male here for SCREENING/dysphagia/WEIGHT LOSS.  Past Medical History  Diagnosis Date  . Seizures     in remote past   . Chronic back pain   . DDD (degenerative disc disease), lumbar   . COPD (chronic obstructive pulmonary disease)   . Shortness of breath dyspnea     Past Surgical History  Procedure Laterality Date  . None      Prior to Admission medications   Medication Sig Start Date End Date Taking? Authorizing Provider  aspirin EC 81 MG tablet Take 81 mg by mouth daily.   Yes Historical Provider, MD  fludrocortisone (FLORINEF) 0.1 MG tablet Take 2 tablets (0.2 mg total) by mouth 2 (two) times daily. 07/17/14  Yes Estela Leonie Green, MD  HYDROcodone-acetaminophen (NORCO) 5-325 MG per tablet Take 1-2 tablets by mouth every 4 (four) hours as needed. 07/30/14  Yes Elnora Morrison, MD  midodrine (PROAMATINE) 10 MG tablet Take 1 tablet (10 mg total) by mouth 3 (three) times daily with meals. 07/17/14  Yes Erline Hau, MD  omeprazole (PRILOSEC) 20 MG capsule Take 20 mg by mouth daily.   Yes Historical Provider, MD  polyethylene glycol-electrolytes (TRILYTE) 420 G solution Take 4,000 mLs by mouth as directed. 10/26/14  Yes Danie Binder, MD  tiotropium (SPIRIVA) 18 MCG inhalation capsule Place 18 mcg into inhaler and inhale daily.   Yes Historical Provider, MD    Allergies as of 10/26/2014  . (No Known Allergies)    Family History  Problem Relation Age of Onset  . Colon cancer Neg Hx   . Stomach cancer Maternal Grandfather   . Sickle cell anemia Daughter     History   Social History  . Marital Status: Legally Separated    Spouse Name: N/A  . Number of Children: N/A  . Years of Education: N/A   Occupational History  . Not on file.   Social History Main Topics  . Smoking status: Former  Smoker    Quit date: 08/11/1983  . Smokeless tobacco: Not on file  . Alcohol Use: 0.0 oz/week    0 Standard drinks or equivalent per week     Comment: remote past, history of ETOH abuse.   . Drug Use: No  . Sexual Activity: Not on file   Other Topics Concern  . Not on file   Social History Narrative    Review of Systems: See HPI, otherwise negative ROS   Physical Exam: BP 155/104 mmHg  Pulse 96  Temp(Src) 97.8 F (36.6 C) (Oral)  Resp 18  Ht 6' (1.829 m)  Wt 169 lb (76.658 kg)  BMI 22.92 kg/m2  SpO2 97% General:   Alert,  pleasant and cooperative in NAD Head:  Normocephalic and atraumatic. Neck:  Supple; Lungs:  Clear throughout to auscultation.    Heart:  Regular rate and rhythm. Abdomen:  Soft, nontender and nondistended. Normal bowel sounds, without guarding, and without rebound.   Neurologic:  Alert and  oriented x4;  grossly normal neurologically.  Impression/Plan:    SCREENING/dysphagia/WEIGHT LOSS  Plan:  1. TCS/EGD-?dil TODAY

## 2014-11-12 NOTE — Op Note (Signed)
Erik [name], [id number]   ENDOSCOPY PROCEDURE REPORT  PATIENT: Erik [name], Erik [name]  MR#: [id number] BIRTHDATE: 03-04-1946 , 42  yrs. old GENDER: male  ENDOSCOPIST: Danie Binder, MD REFFERED [id number] Izola Price, MD  PROCEDURE DATE:  2014-12-10 PROCEDURE:   EGD with biopsy and EGD with dilatation over guidewire   INDICATIONS:1.  dysphagia. 2.  dyspepsia. 3.  WEIGHT LOSS MICROCYTIC ANEMIA WITH POS SICKLE CELL SCREEN & FERRITIN 153. MEDICATIONS: TCS+ Demerol 25 mg IV TOPICAL ANESTHETIC: Viscous Xylocaine  DESCRIPTION OF PROCEDURE:   After the risks benefits and alternatives of the procedure were thoroughly explained, informed consent was obtained.  The EC-[id number] ([id number])  endoscope was introduced through the mouth and advanced to the second portion of the duodenum. The instrument was slowly withdrawn as the mucosa was carefully examined.  Prior to withdrawal of the scope, the guidwire was placed.  The esophagus was dilated successfully.  The patient was recovered in endoscopy and discharged home in satisfactory condition.   ESOPHAGUS: The mucosa of the esophagus appeared normal. EMPIRIC DILATION PERFORMED DUE TO POSSIBLE ESOPHAGEAL WEB.  STOMACH: Mild non-erosive gastritis (inflammation) was found in the gastric antrum.  Multiple biopsies were performed using cold forceps. DUODENUM: The duodenal mucosa showed no abnormalities in the bulb and second portion of the duodenum.   Dilation was then performed at the proximal esophagus  Dilator: Savary over guidewire Size(s): 15-16 MM Resistance: minimal Heme: none  COMPLICATIONS: There were no immediate complications.  ENDOSCOPIC IMPRESSION: 1.   NO OBVIOU SOURCE FOR WEIGHT LOSS IDENTIFIED. 2.   MILD Non-erosive gastritis  RECOMMENDATIONS: FOLLOW A HIGH FIBER/DIABETIC DIET. CONTINUE YOUR WEIGHT LOSS EFFORTS.  LOSE 10 LBS. CONTINUE OMEPRAZOLE.  TAKE 30 MINUTES PRIOR TO YOUR FIRST  MEAL. AWAIT BIOPSY. CONSIDER BPE IF NO H PYLORI GASTRITIS. FOLLOW UP IN 4 MOS. Next colonoscopy in 10-15 years IF THE BENEFITS OUTWEIGH THE RISKS.   eSigned:  Danie Binder, MD 2014-12-10 1:06 PM   CPT CODES: ICD CODES:  The ICD and CPT codes recommended by this software are interpretations from the data that the clinical staff has captured with the software.  The verification of the translation of this report to the ICD and CPT codes and modifiers is the sole responsibility of the health care institution and practicing physician where this report was generated.  [employer] will not be held responsible for the validity of the ICD and CPT codes included on this report.  AMA assumes no liability for data contained or not contained herein. [employer] is a Designer, television/film set of the [employer].

## 2014-11-13 ENCOUNTER — Encounter (HOSPITAL_COMMUNITY): Payer: Self-pay | Admitting: Gastroenterology

## 2014-11-15 ENCOUNTER — Other Ambulatory Visit: Payer: Self-pay | Admitting: Neurology

## 2014-11-15 DIAGNOSIS — G6181 Chronic inflammatory demyelinating polyneuritis: Secondary | ICD-10-CM | POA: Diagnosis not present

## 2014-11-15 DIAGNOSIS — I951 Orthostatic hypotension: Secondary | ICD-10-CM | POA: Diagnosis not present

## 2014-11-15 DIAGNOSIS — R42 Dizziness and giddiness: Secondary | ICD-10-CM | POA: Diagnosis not present

## 2014-11-15 DIAGNOSIS — R2681 Unsteadiness on feet: Secondary | ICD-10-CM | POA: Diagnosis not present

## 2014-11-22 ENCOUNTER — Ambulatory Visit (HOSPITAL_COMMUNITY)
Admission: RE | Admit: 2014-11-22 | Discharge: 2014-11-22 | Disposition: A | Discharge status: Home / Self Care | Payer: Medicare Other | Source: Ambulatory Visit | Attending: Neurology | Admitting: Neurology

## 2014-11-22 ENCOUNTER — Encounter (HOSPITAL_COMMUNITY): Payer: Self-pay

## 2014-11-22 ENCOUNTER — Inpatient Hospital Stay (HOSPITAL_COMMUNITY)
Admission: RE | Admit: 2014-11-22 | Discharge: 2014-11-22 | Disposition: A | Discharge status: Home / Self Care | Payer: Medicare Other | Source: Ambulatory Visit

## 2014-11-22 DIAGNOSIS — G6181 Chronic inflammatory demyelinating polyneuritis: Secondary | ICD-10-CM | POA: Diagnosis not present

## 2014-11-22 LAB — CSF CELL COUNT WITH DIFFERENTIAL
RBC Count, CSF: 42 /mm3 — ABNORMAL HIGH
Tube #: 4
WBC, CSF: 0 /mm3 (ref 0–5)

## 2014-11-22 LAB — PROTEIN, CSF: Total  Protein, CSF: 23 mg/dL (ref 15–45)

## 2014-11-22 LAB — GLUCOSE, CSF: Glucose, CSF: 60 mg/dL (ref 43–76)

## 2014-11-22 MED ORDER — ACETAMINOPHEN 325 MG PO TABS
650.0000 mg | ORAL_TABLET | ORAL | Status: DC | PRN
  Filled 2014-11-22: qty 2

## 2014-11-22 NOTE — Discharge Instructions (Signed)
Lumbar Puncture, Care After Refer to this sheet in the next few weeks. These instructions provide you with information on caring for yourself after your procedure. Your health care provider may also give you more specific instructions. Your treatment has been planned according to current medical practices, but problems sometimes occur. Call your health care provider if you have any problems or questions after your procedure. WHAT TO EXPECT AFTER THE PROCEDURE After your procedure, it is typical to have the following sensations:  Mild discomfort or pain at the insertion site.  Mild headache that is relieved with pain medicines. HOME CARE INSTRUCTIONS  Avoid lifting anything heavier than 10 lb (4.5 kg) for at least 12 hours after the procedure.  Drink enough fluids to keep your urine clear or pale yellow. SEEK MEDICAL CARE IF:  You have fever or chills.  You have nausea or vomiting.  You have a headache that lasts for more than 2 days. SEEK IMMEDIATE MEDICAL CARE IF:  You have any numbness or tingling in your legs.  You are unable to control your bowel or bladder.  You have bleeding or swelling in your back at the insertion site.  You are dizzy or faint.   Remain flat (lay down) as much as possible for the remainder of the day.

## 2014-11-22 NOTE — Procedures (Signed)
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

## 2014-11-22 NOTE — Progress Notes (Signed)
Patient was discharged at 1530. VSS. No complaints of pain or discomfort. Discharge instructions reviewed with daughter, [place]. Contacted Dr. Thornton Papas, patient may resume normal medications, including Aspirin 81 mg. Daughter verbalized understanding of discharge instructions.

## 2014-11-23 ENCOUNTER — Telehealth: Payer: Self-pay | Admitting: Gastroenterology

## 2014-11-23 NOTE — Telephone Encounter (Signed)
Please call pt. His stomach Bx shows gastritis.    CONTINUE OMEPRAZOLE 30  MINUTES PRIOR TO YOUR FIRST MEAL.   FOLLOW A HIGH FIBER/LOW FAT DIET. AVOID ITEMS THAT CAUSE BLOATING.   PT NEEDS TO BE WEIGHED AFTRE MAY, 2016.  Follow up in 4 mos E30 DYSPHAGIA/WEIGHT LOSS.  Next colonoscopy in 10-15 years IF THE BENEFITS OUTWEIGH THE RISKS.

## 2014-11-26 ENCOUNTER — Encounter: Payer: Self-pay | Admitting: Gastroenterology

## 2014-11-26 NOTE — Telephone Encounter (Signed)
APPOINTMENT MADE AND LETTER SENT, ON RECALL FOR TCS

## 2014-11-26 NOTE — Telephone Encounter (Signed)
Reminder in epic and OV made

## 2014-11-26 NOTE — Telephone Encounter (Signed)
Tried to call pt. Could not leave a message. Mailing a letter to call.

## 2014-11-27 ENCOUNTER — Other Ambulatory Visit: Payer: Self-pay

## 2014-11-27 DIAGNOSIS — D649 Anemia, unspecified: Secondary | ICD-10-CM

## 2014-11-27 NOTE — Progress Notes (Signed)
Quick Note:  Sickle cell screen positive.  Please refer to hematology. ______

## 2014-12-03 ENCOUNTER — Encounter (HOSPITAL_COMMUNITY): Payer: Self-pay | Admitting: Lab

## 2014-12-07 DIAGNOSIS — G6181 Chronic inflammatory demyelinating polyneuritis: Secondary | ICD-10-CM | POA: Diagnosis not present

## 2014-12-07 DIAGNOSIS — M5030 Other cervical disc degeneration, unspecified cervical region: Secondary | ICD-10-CM | POA: Diagnosis not present

## 2014-12-07 DIAGNOSIS — I951 Orthostatic hypotension: Secondary | ICD-10-CM | POA: Diagnosis not present

## 2014-12-07 DIAGNOSIS — J439 Emphysema, unspecified: Secondary | ICD-10-CM | POA: Diagnosis not present

## 2014-12-13 DIAGNOSIS — I951 Orthostatic hypotension: Secondary | ICD-10-CM | POA: Diagnosis not present

## 2014-12-13 DIAGNOSIS — G6181 Chronic inflammatory demyelinating polyneuritis: Secondary | ICD-10-CM | POA: Diagnosis not present

## 2014-12-13 DIAGNOSIS — R2681 Unsteadiness on feet: Secondary | ICD-10-CM | POA: Diagnosis not present

## 2014-12-13 DIAGNOSIS — R42 Dizziness and giddiness: Secondary | ICD-10-CM | POA: Diagnosis not present

## 2014-12-21 ENCOUNTER — Ambulatory Visit (HOSPITAL_COMMUNITY): Payer: Medicare Other | Admitting: Hematology & Oncology

## 2015-01-04 ENCOUNTER — Encounter (HOSPITAL_COMMUNITY): Payer: Medicare Other | Attending: Hematology & Oncology | Admitting: Hematology & Oncology

## 2015-01-04 VITALS — BP 149/80 | HR 89 | Temp 98.2°F | Resp 18 | Wt 165.7 lb

## 2015-01-04 DIAGNOSIS — R634 Abnormal weight loss: Secondary | ICD-10-CM

## 2015-01-04 DIAGNOSIS — D649 Anemia, unspecified: Secondary | ICD-10-CM | POA: Diagnosis not present

## 2015-01-04 LAB — IRON AND TIBC
Iron: 58 ug/dL (ref 45–182)
Saturation Ratios: 19 % (ref 17.9–39.5)
TIBC: 301 ug/dL (ref 250–450)
UIBC: 243 ug/dL

## 2015-01-04 LAB — COMPREHENSIVE METABOLIC PANEL
ALBUMIN: 3.9 g/dL (ref 3.5–5.0)
ALK PHOS: 55 U/L (ref 38–126)
ALT: 10 U/L — AB (ref 17–63)
AST: 20 U/L (ref 15–41)
Anion gap: 12 (ref 5–15)
BUN: 15 mg/dL (ref 6–20)
CALCIUM: 9 mg/dL (ref 8.9–10.3)
CO2: 28 mmol/L (ref 22–32)
Chloride: 99 mmol/L — ABNORMAL LOW (ref 101–111)
Creatinine, Ser: 1.08 mg/dL (ref 0.61–1.24)
GFR calc Af Amer: >60 mL/min (ref >60)
GFR calc non Af Amer: >60 mL/min (ref >60)
GLUCOSE: 107 mg/dL — AB (ref 65–99)
Potassium: 3.2 mmol/L — ABNORMAL LOW (ref 3.5–5.1)
Sodium: 139 mmol/L (ref 135–145)
TOTAL PROTEIN: 7.2 g/dL (ref 6.5–8.1)
Total Bilirubin: 0.8 mg/dL (ref 0.3–1.2)

## 2015-01-04 LAB — CBC WITH DIFFERENTIAL/PLATELET
BASOS ABS: 0.1 10*3/uL (ref 0.0–0.1)
Basophils Relative: 1 % (ref 0–1)
EOS PCT: 4 % (ref 0–5)
Eosinophils Absolute: 0.4 10*3/uL (ref 0.0–0.7)
HEMATOCRIT: 35.8 % — AB (ref 39.0–52.0)
HEMOGLOBIN: 11.6 g/dL — AB (ref 13.0–17.0)
Lymphocytes Relative: 19 % (ref 12–46)
Lymphs Abs: 1.8 10*3/uL (ref 0.7–4.0)
MCH: 22.7 pg — ABNORMAL LOW (ref 26.0–34.0)
MCHC: 32.4 g/dL (ref 30.0–36.0)
MCV: 70.1 fL — AB (ref 78.0–100.0)
Monocytes Absolute: 0.6 10*3/uL (ref 0.1–1.0)
Monocytes Relative: 6 % (ref 3–12)
Neutro Abs: 6.8 10*3/uL (ref 1.7–7.7)
Neutrophils Relative %: 70 % (ref 43–77)
Platelets: 299 10*3/uL (ref 150–400)
RBC: 5.11 MIL/uL (ref 4.22–5.81)
RDW: 15.7 % — ABNORMAL HIGH (ref 11.5–15.5)
WBC: 9.5 10*3/uL (ref 4.0–10.5)

## 2015-01-04 LAB — FERRITIN: Ferritin: 84 ng/mL (ref 24–336)

## 2015-01-04 LAB — RETICULOCYTES
RBC.: 5.11 MIL/uL (ref 4.22–5.81)
Retic Count, Absolute: 71.5 10*3/uL (ref 19.0–186.0)
Retic Ct Pct: 1.4 % (ref 0.4–3.1)

## 2015-01-04 LAB — LACTATE DEHYDROGENASE: LDH: 159 U/L (ref 98–192)

## 2015-01-04 LAB — VITAMIN B12: Vitamin B-12: 205 pg/mL (ref 180–914)

## 2015-01-04 LAB — FOLATE: Folate: 19.4 ng/mL (ref >5.9)

## 2015-01-04 LAB — TSH: TSH: 0.36 u[IU]/mL (ref 0.350–4.500)

## 2015-01-04 LAB — SEDIMENTATION RATE: Sed Rate: 18 mm/hr — ABNORMAL HIGH (ref 0–16)

## 2015-01-04 NOTE — Progress Notes (Signed)
[place] [facility] [place] NOTE  Patient Care Team: [name], MD as PCP - General (Nurse Practitioner) Danie Binder, MD as Consulting Physician (Gastroenterology)  CHIEF COMPLAINTS/PURPOSE OF CONSULTATION:  Microcytic Anemia, labs on 07/30/2014 with hgb 12, hct 37.3 and mcv 70.4 Normal iron studies Positive sickle cell screen Colonoscopy 11/12/2014 EGD 11/12/2014 Esophageal dilation 11/12/2014  HISTORY OF PRESENTING ILLNESS:  Erik [name] 69 y.o. male is here because of microcytic anemia. He has been evaluated by GI. He is noted to have a positive sickle cell screen He came in today accompanied by his daughter who is followed by [facility] for sickle cell anemia  He notes that he has not been seen by her regular physician for many years. He presented to the emergency department last year on several occasions secondary to presyncope. He was noted to have significant orthostatic hypotension. 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 He has been being seen by neurology. He had a spinal tap as part of his evaluation.Marland Kitchen   He sleeps well and has no problems with depression.  He lost 40 lbs since October. He states he saw Dr. in October for back pain and was given medication. He attributes that to his loss of appetite. He states his appetite is currently better and his weight has now stabilized. He was referred to gastroenterology for further evaluation. He was referred to [place] for his microcytic anemia. He has normal iron studies.   MEDICAL HISTORY:  Past Medical History  Diagnosis Date  . Seizures     in remote past   . Chronic back pain   . DDD (degenerative disc disease), lumbar   . COPD (chronic obstructive pulmonary disease)   . Shortness of breath dyspnea     SURGICAL HISTORY: Past Surgical History  Procedure Laterality Date  . None    . Colonoscopy N/A 11/12/2014    Procedure: COLONOSCOPY;  Surgeon: Danie Binder, MD;  Location[facility];  Service: Endoscopy;  Laterality: N/A;  1045  . Esophagogastroduodenoscopy N/A 11/12/2014    Procedure: ESOPHAGOGASTRODUODENOSCOPY (EGD);  Surgeon: Danie Binder, MD;  Location: [facility];  Service: Endoscopy;  Laterality: N/A;  . Esophageal dilation N/A 11/12/2014    Procedure: ESOPHAGEAL DILATION;  Surgeon: Danie Binder, MD;  Location: [facility];  Service: Endoscopy;  Laterality: N/A;    SOCIAL HISTORY: History   Social History  . Marital Status: Legally Separated    Spouse Name: N/A  . Number of Children: N/A  . Years of Education: N/A   Occupational History  . Not on file.   Social History Main Topics  . Smoking status: Former Smoker    Quit date: 08/11/1983  . Smokeless tobacco: Not on file  . Alcohol Use: 0.0 oz/week    0 Standard drinks or equivalent per week     Comment: remote past, history of ETOH abuse.   . Drug Use: No  . Sexual Activity: Not on file   Other Topics Concern  . Not on file   Social History Narrative   Separated. 2 children. 1 boy and 1 girl. Quit smoking in the 80's. Quit drinking in the 80's. 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    FAMILY HISTORY: Family History  Problem Relation Age of Onset  . Colon cancer Neg Hx   . Stomach cancer Maternal Grandfather   . Sickle cell anemia Daughter    has no  family status information on file.   Daughter has sickle cell anemia. Son has sickle cell trait. Mother deceased 45-60 yo, Alzeheimers. Father deceased close to 62 yo. Unsure what he died from. 2 step brothers. 3 sisters. No one has cancer or blood problems that he knows of.  ALLERGIES:  has No Known Allergies.  MEDICATIONS:  Current Outpatient Prescriptions  Medication Sig Dispense Refill  . albuterol (PROVENTIL HFA;VENTOLIN HFA) 108 (90 BASE) MCG/ACT inhaler Inhale 2 puffs into the lungs every 6 (six) hours as needed for wheezing or shortness of breath.    Marland Kitchen aspirin EC 81 MG tablet Take 81 mg by mouth  daily.    . diclofenac (VOLTAREN) 75 MG EC tablet Take 75 mg by mouth 2 (two) times daily as needed.    . fludrocortisone (FLORINEF) 0.1 MG tablet Take 2 tablets (0.2 mg total) by mouth 2 (two) times daily. (Patient taking differently: Take 0.1 mg by mouth 2 (two) times daily. Take 3 tablets twice daily) 60 tablet 3  . midodrine (PROAMATINE) 10 MG tablet Take 1 tablet (10 mg total) by mouth 3 (three) times daily with meals. 90 tablet 3  . omeprazole (PRILOSEC) 20 MG capsule Take 20 mg by mouth daily.    Marland Kitchen tiotropium (SPIRIVA) 18 MCG inhalation capsule Place 18 mcg into inhaler and inhale daily.     No current facility-administered medications for this visit.    Review of Systems  Constitutional: Negative for fever, chills, weight loss and malaise/fatigue.  HENT: Negative for congestion, hearing loss, nosebleeds, sore throat and tinnitus.   Eyes: Negative for blurred vision, double vision, pain and discharge.  Respiratory: Positive for shortness of breath. Negative for cough, hemoptysis, sputum production and wheezing.        When he walks he gets short winded.  Cardiovascular: Negative for chest pain, palpitations, claudication, leg swelling and PND.  Gastrointestinal: Negative for heartburn, nausea, vomiting, abdominal pain, diarrhea, constipation, blood in stool and melena.  Genitourinary: Negative for dysuria, urgency, frequency and hematuria.  Musculoskeletal: Positive for back pain. Negative for myalgias, joint pain and falls.       The back pain is sore and burns.  Skin: Negative for itching and rash.  Neurological: Negative for dizziness, tingling, tremors, sensory change, speech change, focal weakness, seizures, loss of consciousness, weakness and headaches.       No seizures since he quit drinking.  Endo/Heme/Allergies: Does not bruise/bleed easily.  Psychiatric/Behavioral: Negative for depression, suicidal ideas, memory loss and substance abuse. The patient is not nervous/anxious  and does not have insomnia.    14 point ROS was done and is otherwise as detailed above or in HPI   PHYSICAL EXAMINATION:  ECOG PERFORMANCE STATUS: 0 - Asymptomatic  Filed Vitals:   01/04/15 1533  BP: 149/80  Pulse: 89  Temp: 98.2 F (36.8 C)  Resp: 18   Filed Weights   01/04/15 1533  Weight: 165 lb 11.2 oz (75.161 kg)     Physical Exam  Constitutional: He is oriented to person, place, and time and well-developed, well-nourished, and in no distress.  HENT:  Head: Normocephalic and atraumatic.  Nose: Nose normal.  Mouth/Throat: Oropharynx is clear and moist. No oropharyngeal exudate.  Eyes: Conjunctivae and EOM are normal. Pupils are equal, round, and reactive to light. Right eye exhibits no discharge. Left eye exhibits no discharge. No scleral icterus.  Neck: Normal range of motion. Neck supple. No tracheal deviation present. No thyromegaly present.  Cardiovascular: Normal rate, regular rhythm and  normal heart sounds.  Exam reveals no gallop and no friction rub.   No murmur heard. Pulmonary/Chest: Effort normal and breath sounds normal. He has no wheezes. He has no rales.  Abdominal: Soft. Bowel sounds are normal. He exhibits no distension and no mass. There is no tenderness. There is no rebound and no guarding.  Musculoskeletal: Normal range of motion. He exhibits no edema.  Lymphadenopathy:    He has no cervical adenopathy.  Neurological: He is alert and oriented to person, place, and time. He has normal reflexes. No cranial nerve deficit. Gait normal. Coordination normal.  Skin: Skin is warm and dry. No rash noted.  Psychiatric: Mood, memory, affect and judgment normal.  Nursing note and vitals reviewed.   LABORATORY DATA:  I have reviewed the data as listed Lab Results  Component Value Date   WBC 9.5 01/04/2015   HGB 11.6* 01/04/2015   HCT 35.8* 01/04/2015   MCV 70.1* 01/04/2015   PLT 299 01/04/2015    ASSESSMENT & PLAN:  Microcytic Anemia, labs on  07/30/2014 with hgb 12, hct 37.3 and mcv 70.4 Normal iron studies Positive sickle cell screen Colonoscopy 11/12/2014 EGD 11/12/2014 Esophageal dilation 11/12/2014  69 year old male with microcytic anemia, positive sickle cell screen, (daughter with sickle cell anemia and son with sickle cell trait) normal iron studies and GI evaluation.  Unlike sickle cell disease, individuals with sickle cell trait have a normal hemoglobin level, hematocrit, reticulocyte count, red blood cell indices, and peripheral blood smear. I have recommended sending a hemoglobin electrophoresis to rule out the possibility of a coexisting hemoglobinopathy and establish his hemoglobin profile. We will check other laboratory studies to look for other causes of his mild anemia. He has mild CKD.  We will regroup within a few weeks to review the results and make any additional recommendations at that time.    Orders Placed This Encounter  Procedures  . CBC with Differential  . Comprehensive metabolic panel  . Lactate dehydrogenase  . Sedimentation rate  . Kappa/lambda light chains  . IgG, IgA, IgM  . Immunofixation electrophoresis  . Protein electrophoresis, serum  . Reticulocytes  . Ferritin  . Vitamin B12  . Folate  . Iron and TIBC  . Haptoglobin  . Testosterone  . Testosterone, free  . TSH  . Hemoglobinopathy evaluation    All questions were answered. The patient knows to call the clinic with any problems, questions or concerns. This note was electronically signed.    This document serves as a record of services personally performed by Ancil Linsey, MD. It was created on her behalf by Arlyce Harman, a trained medical scribe. The creation of this record is based on the scribe's personal observations and the provider's statements to them. This document has been checked and approved by the attending provider.  I have reviewed the above documentation for accuracy and completeness, and I agree with the  above. This note was electronically signed.  Molli Hazard, MD  01/07/2015 4:21 PM

## 2015-01-04 NOTE — Patient Instructions (Signed)
[place] at [facility] Discharge Instructions  RECOMMENDATIONS MADE BY THE CONSULTANT AND ANY TEST RESULTS WILL BE SENT TO YOUR REFERRING PHYSICIAN.  Lab work today. Return as scheduled.  Thank you for choosing [place] at [facility] to provide your oncology and hematology care.  To afford each patient quality time with our provider, please arrive at least 15 minutes before your scheduled appointment time.    You need to re-schedule your appointment should you arrive 10 or more minutes late.  We strive to give you quality time with our providers, and arriving late affects you and other patients whose appointments are after yours.  Also, if you no show three or more times for appointments you may be dismissed from the clinic at the providers discretion.     Again, thank you for choosing [facility].  Our hope is that these requests will decrease the amount of time that you wait before being seen by our physicians.       _____________________________________________________________  Should you have questions after your visit to [facility], please contact our office at [phone] between the hours of 8:30 a.m. and 4:30 p.m.  Voicemails left after 4:30 p.m. will not be returned until the following business day.  For prescription refill requests, have your pharmacy contact our office.

## 2015-01-04 NOTE — Progress Notes (Signed)
Erik [name] presented for labwork. Labs per MD order drawn via Peripheral Line 23 gauge needle inserted in right antecubital.  Good blood return present. Procedure without incident.  Needle removed intact. Patient tolerated procedure well.

## 2015-01-05 LAB — IGG, IGA, IGM
IgA: 310 mg/dL (ref 61–437)
IgG (Immunoglobin G), Serum: 1113 mg/dL (ref 700–1600)
IgM, Serum: 135 mg/dL (ref 20–172)

## 2015-01-05 LAB — TESTOSTERONE, FREE: TESTOSTERONE FREE: 5.4 pg/mL — AB (ref 6.6–18.1)

## 2015-01-05 LAB — HAPTOGLOBIN: Haptoglobin: 164 mg/dL (ref 34–200)

## 2015-01-05 LAB — TESTOSTERONE: Testosterone: 396 ng/dL (ref 348–1197)

## 2015-01-06 LAB — KAPPA/LAMBDA LIGHT CHAINS
KAPPA, LAMDA LIGHT CHAIN RATIO: 1.13 (ref 0.26–1.65)
Kappa free light chain: 23.29 mg/L — ABNORMAL HIGH (ref 3.30–19.40)
Lambda free light chains: 20.54 mg/L (ref 5.71–26.30)

## 2015-01-07 ENCOUNTER — Encounter (HOSPITAL_COMMUNITY): Payer: Self-pay | Admitting: Hematology & Oncology

## 2015-01-08 LAB — PROTEIN ELECTROPHORESIS, SERUM
A/G Ratio: 1.1 (ref 0.7–2.0)
ALPHA-1-GLOBULIN: 0.2 g/dL (ref 0.1–0.4)
ALPHA-2-GLOBULIN: 0.8 g/dL (ref 0.4–1.2)
Albumin ELP: 3.4 g/dL (ref 3.2–5.6)
Beta Globulin: 1.1 g/dL (ref 0.6–1.3)
Gamma Globulin: 1 g/dL (ref 0.5–1.6)
Globulin, Total: 3.2 g/dL (ref 2.0–4.5)
Total Protein ELP: 6.6 g/dL (ref 6.0–8.5)

## 2015-01-08 LAB — HEMOGLOBINOPATHY EVALUATION
HGB S QUANTITAION: 24.7 % — AB
Hgb A2 Quant: 4.1 % — ABNORMAL HIGH (ref 0.7–3.1)
Hgb A: 71.2 % — ABNORMAL LOW (ref 94.0–98.0)
Hgb C: 0 %
Hgb F Quant: 0 % (ref 0.0–2.0)

## 2015-01-08 LAB — IMMUNOFIXATION ELECTROPHORESIS
IGG (IMMUNOGLOBIN G), SERUM: 1148 mg/dL (ref 700–1600)
IGM, SERUM: 141 mg/dL (ref 20–172)
IgA: 320 mg/dL (ref 61–437)
TOTAL PROTEIN ELP: 6.7 g/dL (ref 6.0–8.5)

## 2015-01-22 ENCOUNTER — Encounter (HOSPITAL_COMMUNITY): Payer: Self-pay | Admitting: Hematology & Oncology

## 2015-01-22 ENCOUNTER — Encounter (HOSPITAL_COMMUNITY): Payer: Medicare Other | Attending: Hematology & Oncology | Admitting: Hematology & Oncology

## 2015-01-22 VITALS — BP 179/84 | HR 68 | Temp 98.0°F | Resp 16 | Wt 170.5 lb

## 2015-01-22 DIAGNOSIS — R634 Abnormal weight loss: Secondary | ICD-10-CM | POA: Insufficient documentation

## 2015-01-22 DIAGNOSIS — D573 Sickle-cell trait: Secondary | ICD-10-CM | POA: Diagnosis not present

## 2015-01-22 DIAGNOSIS — D6489 Other specified anemias: Secondary | ICD-10-CM

## 2015-01-22 DIAGNOSIS — E538 Deficiency of other specified B group vitamins: Secondary | ICD-10-CM

## 2015-01-22 DIAGNOSIS — D649 Anemia, unspecified: Secondary | ICD-10-CM | POA: Insufficient documentation

## 2015-01-22 DIAGNOSIS — D509 Iron deficiency anemia, unspecified: Secondary | ICD-10-CM

## 2015-01-22 NOTE — Progress Notes (Signed)
[place] at [facility] Progress Note  Patient Care Team: [name], MD as PCP - General (Nurse Practitioner) Danie Binder, MD as Consulting Physician (Gastroenterology)  CHIEF COMPLAINTS/PURPOSE OF CONSULTATION:  Microcytic Anemia, labs on 07/30/2014 with hgb 12, hct 37.3 and mcv 70.4 Normal iron studies Positive sickle cell screen Colonoscopy 11/12/2014 EGD 11/12/2014 Esophageal dilation 11/12/2014  HISTORY OF PRESENTING ILLNESS:  Erik [name] 69 y.o. male is here because of microcytic anemia. He has been evaluated by GI. He is noted to have a positive sickle cell screen He came in today accompanied by his daughter who is followed by [facility] for sickle cell anemia  He notes that he has not been seen by her regular physician for many years. He presented to the emergency department last year on several occasions secondary to presyncope. He was noted to have significant orthostatic hypotension. 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 He has been being seen by neurology. He had a spinal tap as part of his evaluation.Marland Kitchen   He notes his appetite has recently picked up and he is pleased with this.   MEDICAL HISTORY:  Past Medical History  Diagnosis Date  . Seizures     in remote past   . Chronic back pain   . DDD (degenerative disc disease), lumbar   . COPD (chronic obstructive pulmonary disease)   . Shortness of breath dyspnea     SURGICAL HISTORY: Past Surgical History  Procedure Laterality Date  . None    . Colonoscopy N/A 11/12/2014    Procedure: COLONOSCOPY;  Surgeon: Danie Binder, MD;  [facility];  Service: Endoscopy;  Laterality: N/A;  1045  . Esophagogastroduodenoscopy N/A 11/12/2014    Procedure: ESOPHAGOGASTRODUODENOSCOPY (EGD);  Surgeon: Danie Binder, MD;  Location: [facility];  Service: Endoscopy;  Laterality: N/A;  . Esophageal dilation N/A 11/12/2014    Procedure: ESOPHAGEAL DILATION;  Surgeon: Danie Binder, MD;   Location: [facility];  Service: Endoscopy;  Laterality: N/A;    SOCIAL HISTORY: History   Social History  . Marital Status: Legally Separated    Spouse Name: N/A  . Number of Children: N/A  . Years of Education: N/A   Occupational History  . Not on file.   Social History Main Topics  . Smoking status: Former Smoker    Quit date: 08/11/1983  . Smokeless tobacco: Not on file  . Alcohol Use: 0.0 oz/week    0 Standard drinks or equivalent per week     Comment: remote past, history of ETOH abuse.   . Drug Use: No  . Sexual Activity: Not on file   Other Topics Concern  . Not on file   Social History Narrative   Separated. 2 children. 1 boy and 1 girl. Quit smoking in the 80's. Quit drinking in the 80's. 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    FAMILY HISTORY: Family History  Problem Relation Age of Onset  . Colon cancer Neg Hx   . Stomach cancer Maternal Grandfather   . Sickle cell anemia Daughter    has no family status information on file.   Daughter has sickle cell anemia. Son has sickle cell trait. Mother deceased 50-60 yo, Alzeheimers. Father deceased close to 66 yo. Unsure what he died from. 2 step brothers. 3 sisters. No one has cancer or blood problems that he knows of.  ALLERGIES:  has No Known Allergies.  MEDICATIONS:  Current Outpatient Prescriptions  Medication Sig Dispense Refill  . albuterol (PROVENTIL HFA;VENTOLIN HFA) 108 (90 BASE) MCG/ACT inhaler Inhale 2 puffs into the lungs every 6 (six) hours as needed for wheezing or shortness of breath.    Marland Kitchen aspirin EC 81 MG tablet Take 81 mg by mouth daily.    . diclofenac (VOLTAREN) 75 MG EC tablet Take 75 mg by mouth 2 (two) times daily as needed.    . fludrocortisone (FLORINEF) 0.1 MG tablet Take 2 tablets (0.2 mg total) by mouth 2 (two) times daily. (Patient taking differently: Take 0.1 mg by mouth 2 (two) times daily. Take 3 tablets twice daily) 60 tablet 3  . midodrine  (PROAMATINE) 10 MG tablet Take 1 tablet (10 mg total) by mouth 3 (three) times daily with meals. 90 tablet 3  . omeprazole (PRILOSEC) 20 MG capsule Take 20 mg by mouth daily.    Marland Kitchen tiotropium (SPIRIVA) 18 MCG inhalation capsule Place 18 mcg into inhaler and inhale daily.     No current facility-administered medications for this visit.    Review of Systems  Constitutional: Negative for fever, chills, weight loss and malaise/fatigue.  HENT: Negative for congestion, hearing loss, nosebleeds, sore throat and tinnitus.   Eyes: Negative for blurred vision, double vision, pain and discharge.  Respiratory: Positive for shortness of breath. Negative for cough, hemoptysis, sputum production and wheezing.        When he walks he gets short winded.  Cardiovascular: Negative for chest pain, palpitations, claudication, leg swelling and PND.  Gastrointestinal: Negative for heartburn, nausea, vomiting, abdominal pain, diarrhea, constipation, blood in stool and melena.  Genitourinary: Negative for dysuria, urgency, frequency and hematuria.  Musculoskeletal: Positive for back pain. Negative for myalgias, joint pain and falls.       The back pain is sore and burns.  Skin: Negative for itching and rash.  Neurological: Negative for dizziness, tingling, tremors, sensory change, speech change, focal weakness, seizures, loss of consciousness, weakness and headaches.       No seizures since he quit drinking.  Endo/Heme/Allergies: Does not bruise/bleed easily.  Psychiatric/Behavioral: Negative for depression, suicidal ideas, memory loss and substance abuse. The patient is not nervous/anxious and does not have insomnia.    14 point ROS was done and is otherwise as detailed above or in HPI   PHYSICAL EXAMINATION:  ECOG PERFORMANCE STATUS: 0 - Asymptomatic  Filed Vitals:   01/22/15 0936  BP: 179/84  Pulse: 68  Temp: 98 F (36.7 C)  Resp: 16   Filed Weights   01/22/15 0936  Weight: 170 lb 8 oz (77.338 kg)       Physical Exam  Constitutional: He is oriented to person, place, and time and well-developed, well-nourished, and in no distress.  HENT:  Head: Normocephalic and atraumatic.  Nose: Nose normal.  Mouth/Throat: Oropharynx is clear and moist. No oropharyngeal exudate.  Eyes: Conjunctivae and EOM are normal. Pupils are equal, round, and reactive to light. Right eye exhibits no discharge. Left eye exhibits no discharge. No scleral icterus.  Neck: Normal range of motion. Neck supple. No tracheal deviation present. No thyromegaly present.  Cardiovascular: Normal rate, regular rhythm and normal heart sounds.  Exam reveals no gallop and no friction rub.   No murmur heard. Pulmonary/Chest: Effort normal and breath sounds normal. He has no wheezes. He has no rales.  Abdominal: Soft. Bowel sounds are normal. He exhibits no distension and no mass. There is no tenderness. There is no rebound and no guarding.  Musculoskeletal: Normal range of motion. He exhibits no edema.  Lymphadenopathy:    He has no cervical adenopathy.  Neurological: He is alert and oriented to person, place, and time. He has normal reflexes. No cranial nerve deficit. Gait normal. Coordination normal.  Skin: Skin is warm and dry. No rash noted.  Psychiatric: Mood, memory, affect and judgment normal.  Nursing note and vitals reviewed.   LABORATORY DATA:  I have reviewed the data as listed Lab Results  Component Value Date   WBC 9.5 01/04/2015   HGB 11.6* 01/04/2015   HCT 35.8* 01/04/2015   MCV 70.1* 01/04/2015   PLT 299 01/04/2015   Results for KALEAB, FRASIER (MRN [id number]) as of 01/22/2015 12:58  Ref. Range 01/04/2015 16:30 01/04/2015 16:30  Comment Unknown Comment   Sodium Latest Ref Range: 135-145 mmol/L 139   Potassium Latest Ref Range: 3.5-5.1 mmol/L 3.2 (L)   Chloride Latest Ref Range: 101-111 mmol/L 99 (L)   CO2 Latest Ref Range: 22-32 mmol/L 28   BUN Latest Ref Range: 6-20 mg/dL 15   Creatinine Latest  Ref Range: 0.61-1.24 mg/dL 1.08   Calcium Latest Ref Range: 8.9-10.3 mg/dL 9.0   EGFR (Non-African Amer.) Latest Ref Range: >60 mL/min >60   EGFR (African American) Latest Ref Range: >60 mL/min >60   Glucose Latest Ref Range: 65-99 mg/dL 107 (H)   Anion gap Latest Ref Range: 5-15  12   Alkaline Phosphatase Latest Ref Range: 38-126 U/L 55   Albumin Latest Ref Range: 3.5-5.0 g/dL 3.9   Albumin/Globulin Ratio Latest Ref Range: 0.7-2.0  1.1   AST Latest Ref Range: 15-41 U/L 20   ALT Latest Ref Range: 17-63 U/L 10 (L)   Total Protein Latest Ref Range: 6.5-8.1 g/dL 7.2   Total Bilirubin Latest Ref Range: 0.3-1.2 mg/dL 0.8   LDH Latest Ref Range: 98-192 U/L 159   Iron Latest Ref Range: 45-182 ug/dL 58   UIBC Latest Units: ug/dL 243   TIBC Latest Ref Range: 250-450 ug/dL 301   Saturation Ratios Latest Ref Range: 17.9-39.5 % 19   Ferritin Latest Ref Range: 24-336 ng/mL 84   Folate Latest Ref Range: >5.9 ng/mL 19.4   Vitamin B-12 Latest Ref Range: 180-914 pg/mL 205   Total Protein ELP Latest Ref Range: 6.0-8.5 g/dL 6.6 6.7  Albumin ELP Latest Ref Range: 3.2-5.6 g/dL 3.4   Globulin, Total Latest Ref Range: 2.0-4.5 g/dL 3.2   Alpha-1-Globulin Latest Ref Range: 0.1-0.4 g/dL 0.2   Alpha-2-Globulin Latest Ref Range: 0.4-1.2 g/dL 0.8   Beta Globulin Latest Ref Range: 0.6-1.3 g/dL 1.1   Gamma Globulin Latest Ref Range: 0.5-1.6 g/dL 1.0   M-SPIKE, % Latest Ref Range: Not Observed g/dL Not Observed   SPE Interp. Unknown Comment   IgG (Immunoglobin G), Serum Latest Ref Range: 279-735-3128 mg/dL 1113 1148  IgA Latest Ref Range: 61-437 mg/dL 310 320  IgM, Serum Latest Ref Range: 20-172 mg/dL 135 141  Kappa free light chain Latest Ref Range: 3.30-19.40 mg/L 23.29 (H)   Lamda free light chains Latest Ref Range: 5.71-26.30 mg/L 20.54   Kappa, lamda light chain ratio Latest Ref Range: 0.26-1.65  1.13   WBC Latest Ref Range: 4.0-10.5 K/uL 9.5   RBC Latest Ref Range: 4.22-5.81 MIL/uL 5.11   Hemoglobin Latest  Ref Range: 13.0-17.0 g/dL 11.6 (L)   HCT Latest Ref Range: 39.0-52.0 % 35.8 (L)   MCV Latest Ref Range: 78.0-100.0 fL 70.1 (L)   MCH Latest Ref Range: 26.0-34.0 pg 22.7 (L)   MCHC  Latest Ref Range: 30.0-36.0 g/dL 32.4   RDW Latest Ref Range: 11.5-15.5 % 15.7 (H)   Platelets Latest Ref Range: 150-400 K/uL 299   Neutrophils Latest Ref Range: 43-77 % 70   Lymphocytes Latest Ref Range: 12-46 % 19   Monocytes Relative Latest Ref Range: 3-12 % 6   Eosinophil Latest Ref Range: 0-5 % 4   Basophil Latest Ref Range: 0-1 % 1   NEUT# Latest Ref Range: 1.7-7.7 K/uL 6.8   Lymphocyte # Latest Ref Range: 0.7-4.0 K/uL 1.8   Monocyte # Latest Ref Range: 0.1-1.0 K/uL 0.6   Eosinophils Absolute Latest Ref Range: 0.0-0.7 K/uL 0.4   Basophils Absolute Latest Ref Range: 0.0-0.1 K/uL 0.1   RBC. Latest Ref Range: 4.22-5.81 MIL/uL 5.11   Retic Ct Pct Latest Ref Range: 0.4-3.1 % 1.4   Retic Count, Manual Latest Ref Range: 19.0-186.0 K/uL 71.5   HGB A Latest Ref Range: 94.0-98.0 % 71.2 (L)   Hgb A2 Quant Latest Ref Range: 0.7-3.1 % 4.1 (H)   Hgb F Quant Latest Ref Range: 0.0-2.0 % 0.0   Hgb S Quant Latest Ref Range: 0.0 % 24.7 (H)   HGB C Latest Ref Range: 0.0 % 0.0   Please Note: Unknown Comment   Haptoglobin Latest Ref Range: 34-200 mg/dL 164   Sed Rate Latest Ref Range: 0-16 mm/hr 18 (H)   Testosterone Latest Ref Range: 608-640-4739 ng/dL 396   Testosterone Free Latest Ref Range: 6.6-18.1 pg/mL 5.4 (L)   TSH Latest Ref Range: 0.350-4.500 uIU/mL 0.360   Immunofixation Result, Serum Unknown  Comment  Comment, Testosterone Unknown Comment    ASSESSMENT & PLAN:  Microcytic Anemia, labs on 07/30/2014 with hgb 12, hct 37.3 and mcv 70.4 Mildly low iron saturation at 19% Positive sickle cell screen Colonoscopy 11/12/2014 EGD 11/12/2014 Esophageal dilation 11/12/2014  69 year old male with microcytic anemia, positive sickle cell screen, (daughter with sickle cell anemia and son with sickle cell trait) normal iron  studies and GI evaluation.  Unlike sickle cell disease, individuals with sickle cell trait have a normal hemoglobin level, hematocrit, reticulocyte count, red blood cell indices, and peripheral blood smear.   We reviewed his labs in detail from his last visit. He has borderline low B12 and was instructed to start SL B12 supplementation. We will monitor his B12 levels. We discussed the role of BMBX and have opted to wait on the procedure.  He will follow-up in 2 months for repeat CBC and B12 level. Additional recommendations will be made at that time pending his CBC results.  All questions were answered. The patient knows to call the clinic with any problems, questions or concerns. This note was electronically signed.    This document serves as a record of services personally performed by Ancil Linsey, MD. It was created on her behalf by Arlyce Harman, a trained medical scribe. The creation of this record is based on the scribe's personal observations and the provider's statements to them. This document has been checked and approved by the attending provider.  I have reviewed the above documentation for accuracy and completeness, and I agree with the above. This note was electronically signed.  Molli Hazard, MD

## 2015-01-22 NOTE — Patient Instructions (Signed)
..  [place] at [facility] Discharge Instructions  RECOMMENDATIONS MADE BY THE CONSULTANT AND ANY TEST RESULTS WILL BE SENT TO YOUR REFERRING PHYSICIAN.  You will have lab work and a follow up appointment with [place] in two months.  Dr.  Muse wants you to start taking sublingual B12, '500mg'$ -'1000mg'$  daily.  Please see your pharmacist for this medication, it is over the counter.  Please call us with any questions or concerns.    Thank you for choosing [place] at [facility] to provide your oncology and hematology care.  To afford each patient quality time with our provider, please arrive at least 15 minutes before your scheduled appointment time.    You need to re-schedule your appointment should you arrive 10 or more minutes late.  We strive to give you quality time with our providers, and arriving late affects you and other patients whose appointments are after yours.  Also, if you no show three or more times for appointments you may be dismissed from the clinic at the providers discretion.     Again, thank you for choosing [facility].  Our hope is that these requests will decrease the amount of time that you wait before being seen by our physicians.       _____________________________________________________________  Should you have questions after your visit to [facility], please contact our office at [phone] between the hours of 8:30 a.m. and 4:30 p.m.  Voicemails left after 4:30 p.m. will not be returned until the following business day.  For prescription refill requests, have your pharmacy contact our office.

## 2015-02-01 DIAGNOSIS — M549 Dorsalgia, unspecified: Secondary | ICD-10-CM | POA: Diagnosis not present

## 2015-02-01 DIAGNOSIS — I951 Orthostatic hypotension: Secondary | ICD-10-CM | POA: Diagnosis not present

## 2015-02-01 DIAGNOSIS — G6181 Chronic inflammatory demyelinating polyneuritis: Secondary | ICD-10-CM | POA: Diagnosis not present

## 2015-03-27 ENCOUNTER — Encounter (HOSPITAL_COMMUNITY): Payer: Self-pay | Admitting: Hematology & Oncology

## 2015-03-27 ENCOUNTER — Encounter (HOSPITAL_COMMUNITY): Payer: Medicare Other | Attending: Hematology & Oncology | Admitting: Hematology & Oncology

## 2015-03-27 ENCOUNTER — Ambulatory Visit (HOSPITAL_COMMUNITY): Payer: Medicare Other | Admitting: Hematology & Oncology

## 2015-03-27 ENCOUNTER — Encounter (HOSPITAL_BASED_OUTPATIENT_CLINIC_OR_DEPARTMENT_OTHER): Payer: Medicare Other

## 2015-03-27 VITALS — BP 159/81 | HR 84 | Temp 98.2°F | Resp 18 | Wt 171.8 lb

## 2015-03-27 DIAGNOSIS — D6489 Other specified anemias: Secondary | ICD-10-CM

## 2015-03-27 DIAGNOSIS — E538 Deficiency of other specified B group vitamins: Secondary | ICD-10-CM

## 2015-03-27 DIAGNOSIS — D649 Anemia, unspecified: Secondary | ICD-10-CM | POA: Diagnosis not present

## 2015-03-27 DIAGNOSIS — D573 Sickle-cell trait: Secondary | ICD-10-CM

## 2015-03-27 DIAGNOSIS — R634 Abnormal weight loss: Secondary | ICD-10-CM | POA: Insufficient documentation

## 2015-03-27 LAB — CBC WITH DIFFERENTIAL/PLATELET
Basophils Absolute: 0.1 10*3/uL (ref 0.0–0.1)
Basophils Relative: 1 % (ref 0–1)
EOS PCT: 2 % (ref 0–5)
Eosinophils Absolute: 0.2 10*3/uL (ref 0.0–0.7)
HCT: 37.1 % — ABNORMAL LOW (ref 39.0–52.0)
Hemoglobin: 12.2 g/dL — ABNORMAL LOW (ref 13.0–17.0)
Lymphocytes Relative: 16 % (ref 12–46)
Lymphs Abs: 1.7 10*3/uL (ref 0.7–4.0)
MCH: 22.4 pg — AB (ref 26.0–34.0)
MCHC: 32.9 g/dL (ref 30.0–36.0)
MCV: 68.2 fL — ABNORMAL LOW (ref 78.0–100.0)
MONO ABS: 0.6 10*3/uL (ref 0.1–1.0)
Monocytes Relative: 5 % (ref 3–12)
NEUTROS ABS: 8.3 10*3/uL — AB (ref 1.7–7.7)
NEUTROS PCT: 77 % (ref 43–77)
PLATELETS: 254 10*3/uL (ref 150–400)
RBC: 5.44 MIL/uL (ref 4.22–5.81)
RDW: 15.1 % (ref 11.5–15.5)
WBC: 10.7 10*3/uL — ABNORMAL HIGH (ref 4.0–10.5)

## 2015-03-27 LAB — FERRITIN: Ferritin: 62 ng/mL (ref 24–336)

## 2015-03-27 LAB — VITAMIN B12: Vitamin B-12: 574 pg/mL (ref 180–914)

## 2015-03-27 NOTE — Patient Instructions (Signed)
[place] at [facility]  [facility] Discharge Instructions  RECOMMENDATIONS MADE BY THE CONSULTANT AND ANY TEST RESULTS WILL BE SENT TO YOUR REFERRING PHYSICIAN.  Exam/discussion per Dr.Penland. Lab work today. We will call you with any abnormal results. Lab work and MD appointment again in 3 months. Return as scheduled.  Thank you for choosing [place] at [facility] to provide your oncology and hematology care.  To afford each patient quality time with our provider, please arrive at least 15 minutes before your scheduled appointment time.    You need to re-schedule your appointment should you arrive 10 or more minutes late.  We strive to give you quality time with our providers, and arriving late affects you and other patients whose appointments are after yours.  Also, if you no show three or more times for appointments you may be dismissed from the clinic at the providers discretion.     Again, thank you for choosing [facility].  Our hope is that these requests will decrease the amount of time that you wait before being seen by our physicians.       _____________________________________________________________  Should you have questions after your visit to [facility], please contact our office at [phone] between the hours of 8:30 a.m. and 4:30 p.m.  Voicemails left after 4:30 p.m. will not be returned until the following business day.  For prescription refill requests, have your pharmacy contact our office.

## 2015-03-27 NOTE — Progress Notes (Signed)
[facility] at [facility] Progress Note  Patient Care Team: Augustine Radar, MD as PCP - General (Nurse Practitioner) West Bali, MD as Consulting Physician (Gastroenterology)  CHIEF COMPLAINTS/PURPOSE OF CONSULTATION:  Microcytic Anemia, labs on 07/30/2014 with hgb 12, hct 37.3 and mcv 70.4 Normal iron studies Positive sickle cell screen Colonoscopy 11/12/2014 EGD 11/12/2014 Esophageal dilation 11/12/2014  HISTORY OF PRESENTING ILLNESS:  Erik [name] 69 y.o. male is here because of microcytic anemia. He has been evaluated by GI. He is noted to have a positive sickle cell screen He came in today accompanied by his daughter who is followed by [facility] for sickle cell anemia  Erik [name] is here alone today. He is getting blood drawn upon walking into the room.  He confirms that his back's been bothering him. He says that getting old "probably has something to do with it." He says "[his back] is not any better" even though his medication was supposed to help. He says they said the pain is due to his sciatic nerve, and that the pain runs down both legs.   Erik [name] is also suffering from a bitter taste in his mouth, no matter what he is eating. He has been taking vitamin B12, but in spite of this, he says that the bitter taste is still there, and that nothing tastes good. He states that he didn't have the bitter taste until he started taking one of his medications, but he is unsure which medication started it.   He also says his appetite isn't any better, and that he "doesn't eat." He confirms that he does force himself to eat, and that his weight has been stable lately because of this. He says his daughter does the cooking around the house and she's a good cook, but that she's been "laying out on the job" of cooking for him lately because she is working two jobs. As a testament to the appetite he's lost, Erik [name] said he used to visit [facility] at least once  a week, sometimes twice, and eat four plates whenever he'd visit. He says he usually loves going to [employer] and that he used "waste some money" going there because he had such a good appetite in the past.  He also believes that going on medication in December caused him to lose weight.  He has lost 20 lbs since December.  He denies night sweats, fever, chills. He denies any change in his bowel habits. He denies any other significant complaints other than those detailed above.  MEDICAL HISTORY:  Past Medical History  Diagnosis Date  . Seizures     in remote past   . Chronic back pain   . DDD (degenerative disc disease), lumbar   . COPD (chronic obstructive pulmonary disease)   . Shortness of breath dyspnea     SURGICAL HISTORY: Past Surgical History  Procedure Laterality Date  . None    . Colonoscopy N/A 11/12/2014    Dr. Jennell Corner external and internal hemorrhoids, redundant left colon  . Esophagogastroduodenoscopy N/A 11/12/2014    Dr. Cyndi Bender non-erosive gastritis. negative H.pylori   . Esophageal dilation N/A 11/12/2014    Procedure: ESOPHAGEAL DILATION;  Surgeon: West Bali, MD;  Location: [facility];  Service: Endoscopy;  Laterality: N/A;    SOCIAL HISTORY: Social History   Social History  . Marital Status: Legally Separated    Spouse Name: N/A  . Number of Children: N/A  . Years of Education:  N/A   Occupational History  . Not on file.   Social History Main Topics  . Smoking status: Former Smoker    Quit date: 08/11/1983  . Smokeless tobacco: Not on file  . Alcohol Use: 0.0 oz/week    0 Standard drinks or equivalent per week     Comment: remote past, history of ETOH abuse.   . Drug Use: No  . Sexual Activity: Not on file   Other Topics Concern  . Not on file   Social History Narrative   Separated. 2 children. 1 boy and 1 girl. Quit smoking in the 80's. Quit drinking in the 80's. 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   FAMILY HISTORY: Family History  Problem Relation Age of Onset  . Colon cancer Neg Hx   . Stomach cancer Maternal Grandfather   . Sickle cell anemia Daughter    has no family status information on file.   Daughter has sickle cell anemia. Son has sickle cell trait. Mother deceased 25-60 yo, Alzeheimers. Father deceased close to 43 yo. Unsure what he died from. 2 step brothers. 3 sisters. No one has cancer or blood problems that he knows of.  ALLERGIES:  has No Known Allergies.  MEDICATIONS:  Current Outpatient Prescriptions  Medication Sig Dispense Refill  . albuterol (PROVENTIL HFA;VENTOLIN HFA) 108 (90 BASE) MCG/ACT inhaler Inhale 2 puffs into the lungs every 6 (six) hours as needed for wheezing or shortness of breath.    Marland Kitchen aspirin EC 81 MG tablet Take 81 mg by mouth daily.    . diclofenac (VOLTAREN) 75 MG EC tablet Take 75 mg by mouth 2 (two) times daily as needed.    . fludrocortisone (FLORINEF) 0.1 MG tablet Take 2 tablets (0.2 mg total) by mouth 2 (two) times daily. (Patient taking differently: Take 0.1 mg by mouth 2 (two) times daily. Take 3 tablets twice daily) 60 tablet 3  . midodrine (PROAMATINE) 10 MG tablet Take 1 tablet (10 mg total) by mouth 3 (three) times daily with meals. 90 tablet 3  . tiotropium (SPIRIVA) 18 MCG inhalation capsule Place 18 mcg into inhaler and inhale daily.    . vitamin B-12 (CYANOCOBALAMIN) 1000 MCG tablet Take 1,000 mcg by mouth daily.    . pantoprazole (PROTONIX) 40 MG tablet Take 1 tablet (40 mg total) by mouth daily. 90 tablet 3  . Zinc 50 MG CAPS Take 50 mg by mouth every morning.     No current facility-administered medications for this visit.    Review of Systems  Constitutional: Negative for fever, chills, weight loss and malaise/fatigue.      Bitter taste in mouth. Loss of appetite. HENT: Negative for congestion, hearing loss, nosebleeds, sore throat and tinnitus.   Eyes: Negative for blurred vision, double vision, pain and  discharge.  Respiratory: Positive for shortness of breath. Negative for cough, hemoptysis, sputum production and wheezing.        When he walks he gets short winded.  Cardiovascular: Negative for chest pain, palpitations, claudication, leg swelling and PND.  Gastrointestinal: Negative for heartburn, nausea, vomiting, abdominal pain, diarrhea, constipation, blood in stool and melena.  Genitourinary: Negative for dysuria, urgency, frequency and hematuria.  Musculoskeletal: Positive for back pain. Negative for myalgias, joint pain and falls.       The back pain is sore and burns.  Skin: Negative for itching and rash.  Neurological: Negative for dizziness, tingling, tremors, sensory change, speech change, focal weakness, seizures, loss of consciousness,  weakness and headaches.       No seizures since he quit drinking.  Endo/Heme/Allergies: Does not bruise/bleed easily.  Psychiatric/Behavioral: Negative for depression, suicidal ideas, memory loss and substance abuse. The patient is not nervous/anxious and does not have insomnia.    14 point ROS was done and is otherwise as detailed above or in HPI   PHYSICAL EXAMINATION:  ECOG PERFORMANCE STATUS: 0 - Asymptomatic  Filed Vitals:   03/27/15 1239  BP: 159/81  Pulse: 84  Temp: 98.2 F (36.8 C)  Resp: 18   Filed Weights   03/27/15 1239  Weight: 171 lb 12.8 oz (77.928 kg)    Physical Exam  Constitutional: He is oriented to person, place, and time and well-developed, well-nourished, and in no distress. Ambulated to exam table relatively easily and without assistance. HENT:  Head: Normocephalic and atraumatic.  Nose: Nose normal.  Mouth/Throat: Oropharynx is clear and moist. No oropharyngeal exudate.  Has dentures (for about 5 years). Eyes: Conjunctivae and EOM are normal. Pupils are equal, round, and reactive to light. Right eye exhibits no discharge. Left eye exhibits no discharge. No scleral icterus.  Neck: Normal range of motion.  Neck supple. No tracheal deviation present. No thyromegaly present.  Cardiovascular: Normal rate, regular rhythm and normal heart sounds.  Exam reveals no gallop and no friction rub.   No murmur heard. Pulmonary/Chest: Effort normal and breath sounds normal. He has no wheezes. He has no rales.  Abdominal: Soft. Bowel sounds are normal. He exhibits no distension and no mass. There is no tenderness. There is no rebound and no guarding.  Musculoskeletal: Normal range of motion. He exhibits no edema.  Lymphadenopathy:    He has no cervical adenopathy.  Neurological: He is alert and oriented to person, place, and time. He has normal reflexes. No cranial nerve deficit. Gait normal. Coordination normal.  Skin: Skin is warm and dry. No rash noted.  Psychiatric: Mood, memory, affect and judgment normal.  Nursing note and vitals reviewed.   LABORATORY DATA:  I have reviewed the data as listed Lab Results  Component Value Date   WBC 10.7* 03/27/2015   HGB 12.2* 03/27/2015   HCT 37.1* 03/27/2015   MCV 68.2* 03/27/2015   PLT 254 03/27/2015     ASSESSMENT & PLAN:  Microcytic Anemia, labs on 07/30/2014 with hgb 12, hct 37.3 and mcv 70.4 Mildly low iron saturation at 19% Positive sickle cell screen Colonoscopy 11/12/2014 EGD 11/12/2014 Esophageal dilation 11/12/2014 Serum ferritin of 84NG/ML Borderline low B12 at 205 PG/mL, patient started on sublingual B12 Normal TSH No evidence of an M spike, normal Kappa lambda light chain ratio Normal sedimentation rate   69 year old male with microcytic anemia, positive sickle cell screen, (daughter with sickle cell anemia and son with sickle cell trait) normal iron studies and GI evaluation.  Unlike sickle cell disease, individuals with sickle cell trait have a normal hemoglobin level, hematocrit, reticulocyte count, red blood cell indices, and peripheral blood smear.   His B12 level is improved. He will continue on sublingual B12. We will notify the  patient of his iron levels when they return. His overall laboratory studies are stable. Again if needed we will pursue a bone marrow biopsy in the future are currently will continue with observation.  I have encouraged him to add a zinc supplement to his diet. There is some data that zinc can help with loss of taste. He also complains of altered taste which I suspect may be medication related.  We will continue with ongoing observation seeing him back in 4 months with repeat labs. As detailed the patient will be notified of iron levels when they are available.  All questions were answered. The patient knows to call the clinic with any problems, questions or concerns.   This document serves as a record of services personally performed by Ancil Linsey, MD. It was created on her behalf by Toni Amend, a trained medical scribe. The creation of this record is based on the scribe's personal observations and the provider's statements to them. This document has been checked and approved by the attending provider.  I have reviewed the above documentation for accuracy and completeness, and I agree with the above.  This note was electronically signed.   Molli Hazard, MD

## 2015-03-28 ENCOUNTER — Ambulatory Visit: Payer: Medicare Other | Admitting: Gastroenterology

## 2015-03-28 NOTE — Progress Notes (Signed)
Labs drawn

## 2015-04-05 ENCOUNTER — Other Ambulatory Visit (HOSPITAL_COMMUNITY): Payer: Self-pay | Admitting: Hematology & Oncology

## 2015-04-11 ENCOUNTER — Encounter (HOSPITAL_COMMUNITY): Payer: Self-pay

## 2015-04-11 ENCOUNTER — Encounter (HOSPITAL_COMMUNITY): Payer: Medicare Other | Attending: Hematology & Oncology

## 2015-04-11 DIAGNOSIS — D573 Sickle-cell trait: Secondary | ICD-10-CM | POA: Diagnosis not present

## 2015-04-11 DIAGNOSIS — D509 Iron deficiency anemia, unspecified: Secondary | ICD-10-CM | POA: Diagnosis present

## 2015-04-11 DIAGNOSIS — R634 Abnormal weight loss: Secondary | ICD-10-CM | POA: Insufficient documentation

## 2015-04-11 DIAGNOSIS — D649 Anemia, unspecified: Secondary | ICD-10-CM | POA: Insufficient documentation

## 2015-04-11 MED ORDER — SODIUM CHLORIDE 0.9 % IJ SOLN: 10.0000 mL | Freq: Once | INTRAMUSCULAR | Status: DC

## 2015-04-11 MED ORDER — SODIUM CHLORIDE 0.9 % IV SOLN
Freq: Once | INTRAVENOUS | Status: AC
  Administered 2015-04-11: 14:00:00 via INTRAVENOUS

## 2015-04-11 MED ORDER — SODIUM CHLORIDE 0.9 % IV SOLN
125.0000 mg | Freq: Once | INTRAVENOUS | Status: AC
  Administered 2015-04-11: 125 mg via INTRAVENOUS
  Filled 2015-04-11: qty 10

## 2015-04-11 NOTE — Progress Notes (Signed)
.  Erik [name] Arrives today for iron infusion

## 2015-04-11 NOTE — Progress Notes (Signed)
Patient tolerated infusion well.  VSS 30 minutes post infusion.   

## 2015-04-18 ENCOUNTER — Ambulatory Visit (INDEPENDENT_AMBULATORY_CARE_PROVIDER_SITE_OTHER): Payer: Medicare Other | Admitting: Gastroenterology

## 2015-04-18 ENCOUNTER — Other Ambulatory Visit: Payer: Self-pay

## 2015-04-18 ENCOUNTER — Encounter: Payer: Self-pay | Admitting: Gastroenterology

## 2015-04-18 VITALS — BP 128/75 | HR 82 | Temp 97.3°F | Ht 72.0 in | Wt 172.8 lb

## 2015-04-18 DIAGNOSIS — R634 Abnormal weight loss: Secondary | ICD-10-CM

## 2015-04-18 DIAGNOSIS — M5440 Lumbago with sciatica, unspecified side: Secondary | ICD-10-CM

## 2015-04-18 MED ORDER — PANTOPRAZOLE SODIUM 40 MG PO TBEC: 40.0000 mg | DELAYED_RELEASE_TABLET | Freq: Every day | ORAL | Status: DC

## 2015-04-18 NOTE — Patient Instructions (Addendum)
Start taking Protonix once each morning, 30 minutes before breakfast.   We will see you back in 2 months.   Take Boost or Ensure twice a day for snacks between meals.   We have referred you to a spine specialist.

## 2015-04-18 NOTE — Progress Notes (Signed)
Referring Provider: [name], MD Primary Care Physician:  [name], MD Primary GI: Dr. Oneida Alar   Chief Complaint  Patient presents with  . Follow-up    HPI:   Erik [name] is a 69 y.o. male presenting today with a history of weight loss and lack of appetite. Recent EGD and colonoscopy without source for weight loss. Oct 2015 was 200, 169 in March 2016. CT without significant findings. Today 172. Anemia followed by hematology. Drift in ferritin noted over past few months.   No dysphagia. Just doesn't want to eat. Not taking Prilosec. No improvement with PPI. No nausea/vomiting. No abdominal pain. Denies depression. No anxiety. No constipation or diarrhea. Has boost and ensure at home and states he is drinking once to twice a day. Weight is stable, improved. Bitter taste in mouth since Nov 2015. All food tastes the same.   Back pain, radiating down legs. Feels like pain is contributing to lack of appetite. Wants to be referred to a specialist.     Past Medical History  Diagnosis Date  . Seizures     in remote past   . Chronic back pain   . DDD (degenerative disc disease), lumbar   . COPD (chronic obstructive pulmonary disease)   . Shortness of breath dyspnea     Past Surgical History  Procedure Laterality Date  . None    . Colonoscopy N/A 11/12/2014    Dr. Clayburn Pert external and internal hemorrhoids, redundant left colon  . Esophagogastroduodenoscopy N/A 11/12/2014    Dr. Barnie Alderman non-erosive gastritis. negative H.pylori   . Esophageal dilation N/A 11/12/2014    Procedure: ESOPHAGEAL DILATION;  Surgeon: Danie Binder, MD;  Location: [facility];  Service: Endoscopy;  Laterality: N/A;    Current Outpatient Prescriptions  Medication Sig Dispense Refill  . albuterol (PROVENTIL HFA;VENTOLIN HFA) 108 (90 BASE) MCG/ACT inhaler Inhale 2 puffs into the lungs every 6 (six) hours as needed for wheezing or shortness of breath.    Marland Kitchen aspirin EC 81 MG  tablet Take 81 mg by mouth daily.    . diclofenac (VOLTAREN) 75 MG EC tablet Take 75 mg by mouth 2 (two) times daily as needed.    . fludrocortisone (FLORINEF) 0.1 MG tablet Take 2 tablets (0.2 mg total) by mouth 2 (two) times daily. (Patient taking differently: Take 0.1 mg by mouth 2 (two) times daily. Take 3 tablets twice daily) 60 tablet 3  . midodrine (PROAMATINE) 10 MG tablet Take 1 tablet (10 mg total) by mouth 3 (three) times daily with meals. 90 tablet 3  . tiotropium (SPIRIVA) 18 MCG inhalation capsule Place 18 mcg into inhaler and inhale daily.    . vitamin B-12 (CYANOCOBALAMIN) 1000 MCG tablet Take 1,000 mcg by mouth daily.    . Zinc 50 MG CAPS Take 50 mg by mouth every morning.     No current facility-administered medications for this visit.    Allergies as of 04/18/2015  . (No Known Allergies)    Family History  Problem Relation Age of Onset  . Colon cancer Neg Hx   . Stomach cancer Maternal Grandfather   . Sickle cell anemia Daughter     Social History   Social History  . Marital Status: Legally Separated    Spouse Name: N/A  . Number of Children: N/A  . Years of Education: N/A   Social History Main Topics  . Smoking status: Former Smoker    Quit date: 08/11/1983  . Smokeless tobacco: None  .  Alcohol Use: 0.0 oz/week    0 Standard drinks or equivalent per week     Comment: remote past, history of ETOH abuse.   . Drug Use: No  . Sexual Activity: Not Asked   Other Topics Concern  . None   Social History Narrative    Review of Systems: As mentioned in HPI  Physical Exam: BP 128/75 mmHg  Pulse 82  Temp(Src) 97.3 F (36.3 C) (Oral)  Ht 6' (1.829 m)  Wt 172 lb 12.8 oz (78.382 kg)  BMI 23.43 kg/m2 General:   Alert and oriented. No distress noted. Pleasant and cooperative.  Head:  Normocephalic and atraumatic. Eyes:  Conjuctiva clear without scleral icterus. Abdomen:  +BS, soft, non-tender and non-distended. No rebound or guarding. No HSM or masses  noted. Msk:  Symmetrical without gross deformities. Normal posture. Extremities:  Without edema. Neurologic:  Alert and  oriented x4;  grossly normal neurologically. Psych:  Alert and cooperative. Normal mood and affect.  Lab Results  Component Value Date   WBC 10.7* 03/27/2015   HGB 12.2* 03/27/2015   HCT 37.1* 03/27/2015   MCV 68.2* 03/27/2015   PLT 254 03/27/2015   Lab Results  Component Value Date   IRON 58 01/04/2015   TIBC 301 01/04/2015   FERRITIN 62 03/27/2015

## 2015-04-22 NOTE — Assessment & Plan Note (Signed)
69 year old with just slight improvement in weight since last visit. TCS/EGD without evidence of occult malignancy or source for weight loss identified. Start Protonix due to history of gastritis. Question if chronic lower back pain and radiculopathy contributing to overall quality of life and appetite. CT on file Dec 2015. Continue boost/ensure. Monitor closely. If further evidence of evolving IDA (ferritin has been drifting), consider capsule study. Return in 2 months.

## 2015-04-23 NOTE — Progress Notes (Signed)
cc'ed to pcp °

## 2015-05-27 ENCOUNTER — Encounter: Payer: Self-pay | Admitting: Neurology

## 2015-05-27 ENCOUNTER — Ambulatory Visit (INDEPENDENT_AMBULATORY_CARE_PROVIDER_SITE_OTHER): Payer: Medicare Other | Admitting: Neurology

## 2015-05-27 VITALS — BP 148/88 | HR 98 | Ht 72.0 in | Wt 178.0 lb

## 2015-05-27 DIAGNOSIS — M5440 Lumbago with sciatica, unspecified side: Secondary | ICD-10-CM | POA: Diagnosis not present

## 2015-05-27 DIAGNOSIS — I951 Orthostatic hypotension: Secondary | ICD-10-CM

## 2015-05-27 MED ORDER — GABAPENTIN 300 MG PO CAPS: ORAL_CAPSULE | ORAL | Status: DC

## 2015-05-27 NOTE — Progress Notes (Signed)
Note routed

## 2015-05-27 NOTE — Patient Instructions (Signed)
Start gabapentin '300mg'$  capsules.  Take 1 capsule at bedtime for 7 days, then 1 capsule in morning and 1 capsule at bedtime for 7 days, then 1 capsule three times daily.  Call at end of the month Referral to physical therapy on back for back pain Will review notes from your prior neurologist.  Will consider repeating nerve test Follow up in 3 months

## 2015-05-27 NOTE — Progress Notes (Addendum)
NEUROLOGY CONSULTATION NOTE  LIONAL ICENOGLE MRN: [id number] DOB: 1946/04/12  Referring provider: Laban Emperor, NP Primary care provider: [name], NP  Reason for consult:  Back pain  HISTORY OF PRESENT ILLNESS: Erik [name] is a 69 year old right-handed male with orthostatic hypotension who presents for low back pain.  History obtained by patient and his daughter.  Images of lumbar MRI and labs reviewed.  He developed low back pain about a year ago.  There was no precipitating event.  He reports mid low back pain with diffuse pain radiating down both legs to the feet.  He describes it as a deep burning pain.  It occurs in all positions, either laying down, sitting or ambulating.  He notes some weakness in the legs as well.  He denies numbness.  For the past year, he uses a cane.  He denies bowel or bladder dysfunction but reports impotence over the past year.  He has orthostatic hypotension and takes Florinef and midodrine.  He notes mild neck pain.  MRI of the lumbar spine from 07/17/14 showed spondylosis and degenerative disc disease with disc bulge causing mild bilateral foraminal stenosis at L3-4 and disc bulge at L4-5 causing mild right bilateral foraminal stenosis and right greater than left facet arthropathy.  He saw a neurologist in [place].  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  CSF protein was 23.  Glucose was 60.  Cell count was 0.  Labs in May revealed CBC with chronic mild microcytic anemia, Sed Rate 18, Kappa free light chain 23.29, normal IgG, IgA and IgM, normal SPEP and IFE, B12 205.  PAST MEDICAL HISTORY: Past Medical History  Diagnosis Date  . Seizures ([place])     in remote past   . Chronic back pain   . DDD (degenerative disc disease), lumbar   . COPD (chronic obstructive pulmonary disease) ([place])   . Shortness of breath dyspnea   . GERD (gastroesophageal reflux disease)   . Low blood pressure     PAST SURGICAL  HISTORY: Past Surgical History  Procedure Laterality Date  . None    . Colonoscopy N/A 11/12/2014    Dr. Clayburn Pert external and internal hemorrhoids, redundant left colon  . Esophagogastroduodenoscopy N/A 11/12/2014    Dr. Barnie Alderman non-erosive gastritis. negative H.pylori   . Esophageal dilation N/A 11/12/2014    Procedure: ESOPHAGEAL DILATION;  Surgeon: Danie Binder, MD;  [facility];  Service: Endoscopy;  Laterality: N/A;    MEDICATIONS: Current Outpatient Prescriptions on File Prior to Visit  Medication Sig Dispense Refill  . albuterol (PROVENTIL HFA;VENTOLIN HFA) 108 (90 BASE) MCG/ACT inhaler Inhale 2 puffs into the lungs every 6 (six) hours as needed for wheezing or shortness of breath.    Marland Kitchen aspirin EC 81 MG tablet Take 81 mg by mouth daily.    . diclofenac (VOLTAREN) 75 MG EC tablet Take 75 mg by mouth 2 (two) times daily as needed.    . fludrocortisone (FLORINEF) 0.1 MG tablet Take 2 tablets (0.2 mg total) by mouth 2 (two) times daily. (Patient taking differently: Take 0.1 mg by mouth 2 (two) times daily. Take 3 tablets twice daily) 60 tablet 3  . midodrine (PROAMATINE) 10 MG tablet Take 1 tablet (10 mg total) by mouth 3 (three) times daily with meals. 90 tablet 3  . pantoprazole (PROTONIX) 40 MG tablet Take 1 tablet (40 mg total) by mouth daily. 90 tablet 3  . tiotropium (SPIRIVA) 18 MCG  inhalation capsule Place 18 mcg into inhaler and inhale daily.    . vitamin B-12 (CYANOCOBALAMIN) 1000 MCG tablet Take 1,000 mcg by mouth daily.    . Zinc 50 MG CAPS Take 50 mg by mouth every morning.     No current facility-administered medications on file prior to visit.    ALLERGIES: No Known Allergies  FAMILY HISTORY: Family History  Problem Relation Age of Onset  . Colon cancer Neg Hx   . Stomach cancer Maternal Grandfather   . Sickle cell anemia Daughter     SOCIAL HISTORY: Social History   Social History  . Marital Status: Legally Separated    Spouse Name: N/A    . Number of Children: N/A  . Years of Education: N/A   Occupational History  . Not on file.   Social History Main Topics  . Smoking status: Former Smoker    Quit date: 08/11/1983  . Smokeless tobacco: Not on file  . Alcohol Use: No     Comment: remote past, history of ETOH abuse.   . Drug Use: No  . Sexual Activity: Not on file   Other Topics Concern  . Not on file   Social History Narrative    REVIEW OF SYSTEMS: Constitutional: No fevers, chills, or sweats, no generalized fatigue, change in appetite Eyes: No visual changes, double vision, eye pain Ear, nose and throat: No hearing loss, ear pain, nasal congestion, sore throat Cardiovascular: No chest pain, palpitations Respiratory:  No shortness of breath at rest or with exertion, wheezes GastrointestinaI: No nausea, vomiting, diarrhea, abdominal pain, fecal incontinence Genitourinary:  No dysuria, urinary retention or frequency Musculoskeletal:  No neck pain, back pain Integumentary: No rash, pruritus, skin lesions Neurological: as above Psychiatric: No depression, insomnia, anxiety Endocrine: No palpitations, fatigue, diaphoresis, mood swings, change in appetite, change in weight, increased thirst Hematologic/Lymphatic:  No anemia, purpura, petechiae. Allergic/Immunologic: no itchy/runny eyes, nasal congestion, recent allergic reactions, rashes  PHYSICAL EXAM: Filed Vitals:   05/27/15 0827  BP: 148/88  Pulse: 98   General: No acute distress.  Patient appears well-groomed.  Thin. Head:  Normocephalic/atraumatic Eyes:  fundi unremarkable, without vessel changes, exudates, hemorrhages or papilledema. Neck: supple, no paraspinal tenderness, full range of motion Back: No spinal tenderness in low back. Heart: regular rate, irregular rhythm Lungs: Clear to auscultation bilaterally. Vascular: No carotid bruits. Neurological Exam: Mental status: alert and oriented to person, place, and time, recent and remote memory  intact, fund of knowledge intact, attention and concentration intact, speech fluent and not dysarthric, language intact. Cranial nerves: CN I: not tested CN II: pupils equal, round and reactive to light, visual fields intact, fundi unremarkable, without vessel changes, exudates, hemorrhages or papilledema. CN III, IV, VI:  full range of motion, no nystagmus, no ptosis CN V: facial sensation intact CN VII: upper and lower face symmetric CN VIII: hearing intact CN IX, X: gag intact, uvula midline CN XI: sternocleidomastoid and trapezius muscles intact CN XII: tongue midline Bulk & Tone: normal, no fasciculations. Motor:  5-/5 right triceps.  Otherwise 5/5 throughout. Sensation:  Pinprick and vibration sensation intact. Deep Tendon Reflexes:  absent throughout, toes downgoing.  Finger to nose testing:  Without dysmetria.  Heel to shin:  Without dysmetria.  Gait:  Antalgic gait.  Able to turn.  Unable to tandem walk.  Romberg negative.  IMPRESSION: Low back pain with bilateral leg pain.   Elevated blood pressure Irregular heart beat  PLAN: 1.  Start gabapentin '300mg'$  at bedtime, titrating  to three times daily 2.  Refer to PT for back pain 3.  Will get notes from prior neurologist.   4.  If treatment ineffective, will likely pursue NCV-EMG 5.  Follow up in 3 months. 6.  Recommend PCP evaluate for irregular heartbeat 7.  Follow up with PCP to recheck BP ADDENDUM:  I reviewed prior neurologist's notes (Dr. Merlene Laughter).  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  I will order NCV-EMG to confirm these findings.  Thank you for allowing me to take part in the care of this patient.  Metta Clines, DO  CC:  Laban Emperor, NP  [name], NP

## 2015-05-30 ENCOUNTER — Telehealth: Payer: Self-pay | Admitting: Neurology

## 2015-05-30 DIAGNOSIS — M5440 Lumbago with sciatica, unspecified side: Secondary | ICD-10-CM

## 2015-05-30 NOTE — Addendum Note (Signed)
Addended by: Gerda Diss A on: 05/30/2015 04:17 PM   Modules accepted: Orders

## 2015-05-30 NOTE — Telephone Encounter (Signed)
PT's daughter Arnette Felts called and was asking about some physical therapy that was being suggested for him/Dawn CB# [phone]

## 2015-05-30 NOTE — Telephone Encounter (Signed)
Spoke to daughter. Needed PT set up. Scheduled at [facility] for 10/26 @ 3:15 (must arrive @ 2:45). [name].  Daughter is aware of PT appointment.

## 2015-06-05 ENCOUNTER — Ambulatory Visit (HOSPITAL_COMMUNITY): Payer: Medicare Other | Attending: Neurology | Admitting: Physical Therapy

## 2015-06-05 DIAGNOSIS — M5440 Lumbago with sciatica, unspecified side: Secondary | ICD-10-CM | POA: Insufficient documentation

## 2015-06-05 DIAGNOSIS — M2560 Stiffness of unspecified joint, not elsewhere classified: Secondary | ICD-10-CM | POA: Diagnosis not present

## 2015-06-05 DIAGNOSIS — M2569 Stiffness of other specified joint, not elsewhere classified: Secondary | ICD-10-CM

## 2015-06-05 DIAGNOSIS — M6281 Muscle weakness (generalized): Secondary | ICD-10-CM | POA: Diagnosis not present

## 2015-06-05 DIAGNOSIS — R293 Abnormal posture: Secondary | ICD-10-CM | POA: Insufficient documentation

## 2015-06-05 DIAGNOSIS — M25659 Stiffness of unspecified hip, not elsewhere classified: Secondary | ICD-10-CM | POA: Insufficient documentation

## 2015-06-05 NOTE — Therapy (Signed)
[place], [place], [address] Phone: [phone]   Fax:  [phone]  Physical Therapy Evaluation  Patient Details  Name: Erik [name] MRN: [id number] Date of Birth: 1946/01/15 Referring Provider: [name], DO   Encounter Date: 06/05/2015      PT End of Session - 06/05/15 1634    Visit Number 1   Number of Visits 12   Date for PT Re-Evaluation 07/03/15   Authorization Type Medicare/Medicaid    Authorization Time Period 06/05/15 to 08/05/15   Authorization - Visit Number 1   Authorization - Number of Visits 10   PT Start Time 1500   PT Stop Time 1551   PT Time Calculation (min) 51 min   Activity Tolerance Patient tolerated treatment well   Behavior During Therapy [facility] for tasks assessed/performed      Past Medical History  Diagnosis Date  . Seizures ([place])     in remote past   . Chronic back pain   . DDD (degenerative disc disease), lumbar   . COPD (chronic obstructive pulmonary disease) ([place])   . Shortness of breath dyspnea   . GERD (gastroesophageal reflux disease)   . Low blood pressure     Past Surgical History  Procedure Laterality Date  . None    . Colonoscopy N/A 11/12/2014    Dr. Clayburn Pert external and internal hemorrhoids, redundant left colon  . Esophagogastroduodenoscopy N/A 11/12/2014    Dr. Barnie Alderman non-erosive gastritis. negative H.pylori   . Esophageal dilation N/A 11/12/2014    Procedure: ESOPHAGEAL DILATION;  Surgeon: Danie Binder, MD;  [facility];  Service: Endoscopy;  Laterality: N/A;    There were no vitals filed for this visit.  Visit Diagnosis:  Midline low back pain with sciatica, sciatica laterality unspecified - Plan: PT plan of care cert/re-cert  Muscle weakness - Plan: PT plan of care cert/re-cert  Back stiffness - Plan: PT plan of care cert/re-cert  Poor posture - Plan: PT plan of care cert/re-cert  Hip stiffness, unspecified laterality  - Plan: PT plan of care cert/re-cert      Subjective Assessment - 06/05/15 1503    Subjective He reports he is fairly sore on an average day, also has some trouble walking. Some back stiffness as well.    Pertinent History Patient reports that his back has been hurting for awhile; really started getting worse last October. Can't remember doing anything that made his back worse, just started out of nowhere. States that awhile ago MD said not to pick up anything over 10 pounds. No hip problems. Pain does run down his legs on both sides, front and back.    How long can you sit comfortably? no real limits, just sore    How long can you stand comfortably? no real limits    How long can you walk comfortably? 5-10 minutes    Patient Stated Goals patient cannot recall any specific goals he'd like to achieve    Currently in Pain? Yes   Pain Score 2    Pain Location Back   Pain Orientation Lower            [facility] PT Assessment - 06/05/15 0001    Assessment   Medical Diagnosis LBP with sciatica    Referring Provider [name], DO    Onset Date/Surgical Date --  chronic    Next MD Visit November first wtih Dr. Tomi Likens    Precautions  Precautions Back   Precaution Comments no lifting over 10 pounds; history of seizures    Restrictions   Weight Bearing Restrictions No   Balance Screen   Has the patient fallen in the past 6 months No   Has the patient had a decrease in activity level because of a fear of falling?  No   Is the patient reluctant to leave their home because of a fear of falling?  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    Observation/Other Assessments   Observations repeated motions lumbar flexion and extension negative however extension slightly sore   Focus on  Therapeutic Outcomes (FOTO)  72% limited    Posture/Postural Control   Posture Comments forward head with B IR shouldres, reduced thoracic and lumbar spinal cruves, mild varus knees   AROM   Right Hip External Rotation  --  wfl    Right Hip Internal Rotation  35   Left Hip External Rotation  --  wfl    Left Hip Internal Rotation  34   Lumbar Flexion 87   Lumbar Extension 24   Lumbar - Right Side Bend fingertips past knee    Lumbar - Left Side Bend fingertips past knee    Lumbar - Right Rotation approx 60% limited    Lumbar - Left Rotation approx 60% limited    Strength   Right Hip Flexion 4+/5   Right Hip Extension 4-/5   Right Hip ABduction 3+/5   Left Hip Flexion 5/5   Left Hip Extension 4-/5   Left Hip ABduction 3/5   Right Knee Flexion 5/5   Right Knee Extension 5/5   Left Knee Flexion 4+/5   Left Knee Extension 4+/5   Right Ankle Dorsiflexion 5/5   Left Ankle Dorsiflexion 5/5   Palpation   Palpation comment rigidity noted L1-L5 and sacrum; minimal muscle guarding noted in paraspinals today    Ambulation/Gait   Gait Comments flexed at hips, proximal muscle weakness, swaying during gait, reduced gait speed, reduced rotation of trunk of pelvis, hip ER                   [facility] Adult PT Treatment/Exercise - 06/05/15 0001    Lumbar Exercises: Stretches   Active Hamstring Stretch 3 reps;30 seconds   Active Hamstring Stretch Limitations 12 inch box    Passive Hamstring Stretch 3 reps;30 seconds   Passive Hamstring Stretch Limitations gastroc on slantboard    Hip Flexor Stretch 2 reps;30 seconds   Piriformis Stretch 2 reps;30 seconds   Piriformis Stretch Limitations seated                PT Education - 06/05/15 1633    Education provided Yes   Education Details HEP, plan of care, prognosis    Person(s) Educated Patient   Methods Explanation;Handout   Comprehension Verbalized understanding;Returned demonstration          PT Short Term Goals -  06/05/15 1638    PT SHORT TERM GOAL #1   Title Patient will be able to verbalize the importance of good posture, and will be able to maintain improved posture in sitting and standing with cues no more than 20% of the time    Time 3   Period Weeks   Status New  PT SHORT TERM GOAL #2   Title Patient will improve bilateral hip internal rotation to at least 42 degrees to improve functional mechanics and mobility    Time 3   Period Weeks   Status New   PT SHORT TERM GOAL #3   Title Patient will improve lumbar rotation to being only 30% limited bilaterally in order to improve functional mechanics and reduce pain    Time 3   Period Weeks   Status New   PT SHORT TERM GOAL #4   Title patient will be independent in correctly and consistently performing HEP, to be updated PRN    Time 3   Period Weeks   Status New           PT Long Term Goals - 2015-06-13 1642    PT LONG TERM GOAL #1   Title Patient will demonstrate 5/5 muscle srength in all muscles tested in order to reduce pain and improve functional mechanics    Time 6   Period Weeks   Status New   PT LONG TERM GOAL #2   Title Patient will report that his pain is 0/10 at least 80% of the time with 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    Time 6   Period Weeks   Status New   PT LONG TERM GOAL #3   Title patient will demonstrate reduced rigidity in lumbar and sacral PAs in order to evidence improved spinal mobiltiy and contribute to improved function and overall reduced pain    Time 8   Period Weeks   Status New   PT LONG TERM GOAL #4   Title Patient will perform at least 30 minutes of exercise of light to moderate intensity at least 3 days per week in order to maintain functional gains and promote overall improved health status    Time 6   Period Weeks   Status New               Plan - June 13, 2015 1634    Clinical Impression Statement Patient arrives with diagnosis of low back  pain with bilateral sciatica; repeated motions did not worsen or ease his pain, and he demonstrates bilateral hip stiffness as well as some stiffness in lumbar rotation. He is somewhat tender directly over L4 and has tight PAs throughout his lumbar spine and sacrum. He reports that his pain is on both the front and back of his legs at this time. He does also demonstrate poor posture in standing and especially sitting, which may likely be contributing to his back pain. At this time he will benefit from skilled PT services in order to address his functional limtiations, reduce back pain, and assist him in reaching an optimal lvel of function.    Pt will benefit from skilled therapeutic intervention in order to improve on the following deficits Abnormal gait;Hypomobility;Decreased activity tolerance;Decreased strength;Pain;Decreased balance;Decreased mobility;Difficulty walking;Improper body mechanics;Decreased coordination;Impaired flexibility;Postural dysfunction   Rehab Potential Excellent   PT Frequency 2x / week   PT Duration 6 weeks   PT Treatment/Interventions ADLs/Self Care Home Management;Gait training;Stair training;Functional mobility training;Therapeutic activities;Therapeutic exercise;Balance training;Neuromuscular re-education;Patient/family education;Manual techniques   PT Next Visit Plan review HEP and goals; functional strength and stretching, postural training    PT Home Exercise Plan given    Consulted and Agree with Plan of Care Patient          G-Codes - 2015-06-13 1644    Functional Assessment Tool  Used FOTO 72% limited    Functional Limitation Mobility: Walking and moving around   Mobility: Walking and Moving Around Current Status [phone]) At least 60 percent but less than 80 percent impaired, limited or restricted   Mobility: Walking and Moving Around Goal Status [phone]) At least 40 percent but less than 60 percent impaired, limited or restricted       Problem List Patient  Active Problem List   Diagnosis Date Noted  . Absolute anemia 10/26/2014  . Loss of weight 10/26/2014  . Dysphagia, pharyngoesophageal phase 10/26/2014  . Low back pain 07/10/2014  . Orthostatic hypotension 07/07/2014  . Orthostasis 07/07/2014   Deniece Ree PT, DPT [facility] [address] [place], [place], [address] Phone: [phone]   Fax:  [phone]  Name: Erik [name] MRN: [id number] Date of Birth: June 04, 1946

## 2015-06-05 NOTE — Patient Instructions (Signed)
   BRIDGING  While lying on your back, tighten your lower abdominals, squeeze your buttocks and then raise your buttocks off the floor/bed as creating a "Bridge" with your body.  Repeat 10 times each side, twice a day.     HIP ABDUCTION - STANDING   While standing, raise your leg out to the side. Keep your knee straight and maintain your toes pointed forward the entire time.    Use your arms for support if needed for balance and safety. Make sure your toes stay pointing forward, and not swinging out to the side.  Repeat 10 times each leg, twice a day.    SEATED ALTERNATE ARM AND LEG (DO THIS ON A CHAIR)  While sitting on a inverted Bosu, raise up an arm and opposite leg. Do not let your pelvis or spine move while performing.  Make sure you are sitting up straight and tall instead of leaning back Against the chair. Squeeze your core muscles while you do this.  Repeat 10 times, twice a day.    PIRIFORMIS AND HIP STRETCH - SEATED  While sitting in a chair, cross your affected leg on top of the other as shown.   Next, gently lean forward until a stretch is felt along the crossed leg.  Hold for 30 seconds, three times each leg, twice a day.

## 2015-06-07 ENCOUNTER — Other Ambulatory Visit: Payer: Self-pay | Admitting: *Deleted

## 2015-06-07 DIAGNOSIS — G609 Hereditary and idiopathic neuropathy, unspecified: Secondary | ICD-10-CM

## 2015-06-10 ENCOUNTER — Ambulatory Visit (HOSPITAL_COMMUNITY): Payer: Medicare Other | Admitting: Physical Therapy

## 2015-06-10 DIAGNOSIS — M5440 Lumbago with sciatica, unspecified side: Secondary | ICD-10-CM | POA: Diagnosis not present

## 2015-06-10 DIAGNOSIS — M2560 Stiffness of unspecified joint, not elsewhere classified: Secondary | ICD-10-CM | POA: Diagnosis not present

## 2015-06-10 DIAGNOSIS — M2569 Stiffness of other specified joint, not elsewhere classified: Secondary | ICD-10-CM

## 2015-06-10 DIAGNOSIS — M6281 Muscle weakness (generalized): Secondary | ICD-10-CM

## 2015-06-10 DIAGNOSIS — M25659 Stiffness of unspecified hip, not elsewhere classified: Secondary | ICD-10-CM | POA: Diagnosis not present

## 2015-06-10 DIAGNOSIS — R293 Abnormal posture: Secondary | ICD-10-CM | POA: Diagnosis not present

## 2015-06-10 NOTE — Patient Instructions (Signed)
Scapular Retraction (Standing)    With arms at sides, pinch shoulder blades together. Repeat _10___ times per set. Do __1__ sets per session. Do __2__ sessions per day.  http://orth.exer.us/944   Copyright  VHI. All rights reserved.  Hamstring Stretch: Active    Support behind right knee. Starting with knee bent, attempt to straighten knee until a comfortable stretch is felt in back of thigh. Hold 30____ seconds. Repeat 3____ times per set. Do _1___ sets per session. Do _2___ sessions per day.  http://orth.exer.us/158   Copyright  VHI. All rights reserved.  Knee-to-Chest Stretch: Unilateral    With hand behind right knee, pull knee in to chest until a comfortable stretch is felt in lower back and buttocks. Keep back relaxed. Hold _30___ seconds. Repeat _3___ times per set. Do _1___ sets per session. Do _2___ sessions per day.  http://orth.exer.us/126   Copyright  VHI. All rights reserved.  Bent Leg Lift (Hook-Lying)    Tighten stomach and slowly raise right leg _4___ inches from floor. Keep trunk rigid. Hold _5___ seconds. Repeat 10____ times per set. Do _1___ sets per session. Do __2__ sessions per day.  http://orth.exer.us/1090   Copyright  VHI. All rights reserved.

## 2015-06-10 NOTE — Therapy (Signed)
[place], [place], [address] Phone: [phone]   Fax:  [phone]  Physical Therapy Treatment  Patient Details  Name: Erik [name] MRN: [id number] Date of Birth: 11-12-1945 Referring Provider: [name], DO   Encounter Date: 06/10/2015      PT End of Session - 06/10/15 1600    Visit Number 2   Number of Visits 12   Date for PT Re-Evaluation 07/03/15   Authorization Type Medicare/Medicaid    Authorization Time Period 06/05/15 to 08/05/15   Authorization - Visit Number 2   Authorization - Number of Visits 10   PT Start Time 1520   PT Stop Time 1600   PT Time Calculation (min) 40 min   Activity Tolerance Patient tolerated treatment well      Past Medical History  Diagnosis Date  . Seizures ([place])     in remote past   . Chronic back pain   . DDD (degenerative disc disease), lumbar   . COPD (chronic obstructive pulmonary disease) ([place])   . Shortness of breath dyspnea   . GERD (gastroesophageal reflux disease)   . Low blood pressure     Past Surgical History  Procedure Laterality Date  . None    . Colonoscopy N/A 11/12/2014    Dr. Clayburn Pert external and internal hemorrhoids, redundant left colon  . Esophagogastroduodenoscopy N/A 11/12/2014    Dr. Barnie Alderman non-erosive gastritis. negative H.pylori   . Esophageal dilation N/A 11/12/2014    Procedure: ESOPHAGEAL DILATION;  Surgeon: Danie Binder, MD;  [facility];  Service: Endoscopy;  Laterality: N/A;    There were no vitals filed for this visit.  Visit Diagnosis:  Midline low back pain with sciatica, sciatica laterality unspecified  Muscle weakness  Back stiffness  Poor posture  Hip stiffness, unspecified laterality      Subjective Assessment - 06/10/15 1526    Subjective Pt states he has been doing his exercises every day.    Currently in Pain? No/denies              [facility] Adult PT Treatment/Exercise -  06/10/15 0001    Exercises   Exercises Lumbar   Lumbar Exercises: Stretches   Active Hamstring Stretch 3 reps;30 seconds   Active Hamstring Stretch Limitations supine   Single Knee to Chest Stretch 3 reps;30 seconds   Lumbar Exercises: Standing   Heel Raises 10 reps   Functional Squats 5 reps   Other Standing Lumbar Exercises 3 D hip excursion    Lumbar Exercises: Seated   Other Seated Lumbar Exercises pull into good posture x 5; scapular retraction x 5    Lumbar Exercises: Supine   Bent Knee Raise 10 reps   Bridge 10 reps   Straight Leg Raise 5 reps   Straight Leg Raises Limitations floating    Lumbar Exercises: Sidelying   Hip Abduction 10 reps             PT Education - 06/10/15 1600    Education provided Yes   Education Details new stretches and strengthening.   Person(s) Educated Patient   Methods Explanation;Handout   Comprehension Verbalized understanding;Returned demonstration          PT Short Term Goals - 06/05/15 1638    PT SHORT TERM GOAL #1   Title Patient will be able to verbalize the importance of good posture, and will be able to maintain improved posture in sitting and standing with cues no  more than 20% of the time    Time 3   Period Weeks   Status New   PT SHORT TERM GOAL #2   Title Patient will improve bilateral hip internal rotation to at least 42 degrees to improve functional mechanics and mobility    Time 3   Period Weeks   Status New   PT SHORT TERM GOAL #3   Title Patient will improve lumbar rotation to being only 30% limited bilaterally in order to improve functional mechanics and reduce pain    Time 3   Period Weeks   Status New   PT SHORT TERM GOAL #4   Title patient will be independent in correctly and consistently performing HEP, to be updated PRN    Time 3   Period Weeks   Status New           PT Long Term Goals - 06/05/15 1642    PT LONG TERM GOAL #1   Title Patient will demonstrate 5/5 muscle srength in all muscles  tested in order to reduce pain and improve functional mechanics    Time 6   Period Weeks   Status New   PT LONG TERM GOAL #2   Title Patient will report that his pain is 0/10 at least 80% of the time with 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    Time 6   Period Weeks   Status New   PT LONG TERM GOAL #3   Title patient will demonstrate reduced rigidity in lumbar and sacral PAs in order to evidence improved spinal mobiltiy and contribute to improved function and overall reduced pain    Time 8   Period Weeks   Status New   PT LONG TERM GOAL #4   Title Patient will perform at least 30 minutes of exercise of light to moderate intensity at least 3 days per week in order to maintain functional gains and promote overall improved health status    Time 6   Period Weeks   Status New               Plan - 06/10/15 1604    Clinical Impression Statement Pt with improved sx from exercises.  Instructed pt in nes stretches and stability exercise with verbal and manual cuing needed for stability exercises.  Reviewed evaluation and goals with patient.    PT Next Visit Plan Pt will need continued instruction for functional squatting as pt continues to flex his spine.         Problem List Patient Active Problem List   Diagnosis Date Noted  . Absolute anemia 10/26/2014  . Loss of weight 10/26/2014  . Dysphagia, pharyngoesophageal phase 10/26/2014  . Low back pain 07/10/2014  . Orthostatic hypotension 07/07/2014  . Orthostasis 07/07/2014  Rayetta Humphrey, PT CLT [phone] 06/10/2015, 4:08 PM  [place] [address] [place], [place], [address] Phone: [phone]   Fax:  [phone]  Name: Erik [name] MRN: [id number] Date of Birth: 1946-04-02

## 2015-06-11 ENCOUNTER — Ambulatory Visit (INDEPENDENT_AMBULATORY_CARE_PROVIDER_SITE_OTHER): Payer: Medicare Other | Admitting: Neurology

## 2015-06-11 DIAGNOSIS — G609 Hereditary and idiopathic neuropathy, unspecified: Secondary | ICD-10-CM

## 2015-06-11 DIAGNOSIS — G709 Myoneural disorder, unspecified: Secondary | ICD-10-CM

## 2015-06-11 NOTE — Procedures (Signed)
[facility]  [place], [place]  [place], [place] [address] Tel: [phone] Fax:  [phone] Test Date:  06/11/2015  Patient: Erik [name] DOB: May 14, 1946 Physician: Narda Amber  Sex: Male Height: '6\' 3"'$  Ref Phys: Metta Clines, M.D.  ID#: [id number] Temp: 32.5C Technician: Jerilynn Mages. Dean   Patient Complaints: This is a 69 year-old gentleman presenting for evaluation of low back pain and bilateral leg weakness.  NCV & EMG Findings: Extensive electrodiagnostic testing of the left lower extremity, right lower extremity and additional studies of the left upper extremity shows: 1. Bilateral sural and superficial peroneal sensory responses are within normal limits. 2. Motor responses of all the tested nerves (median, ulnar, peroneal, and tibial nerves) are markedly reduced.  There is post-exercise facilitation of the median (233%), ulnar at ADM (240%), peroneal - tibialis anterior (225%), and tibial (400%) motor responses on the left.  3. Bilateral H reflex studies are prolonged and most likely due to patient's height. 4. Needle electrode examination shows isolated and rare small motor units involving the gluteus medius muscles.  Impression: 1. 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   2. There is no evidence of a generalized sensorimotor polyneuropathy, lumbosacral radiculopathy, or diffuse myopathy.   _____________________________ Narda Amber, D.O.    Nerve Conduction Studies Anti Sensory Summary Table   Stim Site NR Peak (ms) Norm Peak (ms) P-T Amp (V) Norm P-T Amp  Left Sup Peroneal Anti Sensory (Ant Lat Mall)  12 cm    4.0 <4.6 4.1 >3  Right Sup Peroneal Anti Sensory (Ant Lat Mall)  12 cm    3.4 <4.6 3.0 >3  Left Sural Anti Sensory (Lat Mall)  Calf    3.5 <4.6 5.9 >3  Right Sural Anti Sensory (Lat Mall)  Calf    3.8 <4.6 4.4 >3   Motor Summary Table   Stim Site  NR Onset (ms) Norm Onset (ms) O-P Amp (mV) Norm O-P Amp Site1 Site2 Delta-0 (ms) Dist (cm) Vel (m/s) Norm Vel (m/s)  Left Median Motor (Abd Poll Brev)  Wrist    6.8 <4.0 0.6 >5 Elbow Wrist 6.2 34.0 55 >50  Elbow    13.0  0.7  Axilla Elbow 7.9 0.0    Post-exercise    5.1  2.0         Left Peroneal Motor (Ext Dig Brev)  Ankle    5.8 <6.0 0.2 >2.5 B Fib Ankle 15.1 42.0 28 >40  B Fib    20.9  0.2  Poplt B Fib 3.2 10.0 31 >40  Poplt    24.1  0.2         Post-exercise    6.5  0.6         Right Peroneal Motor (Ext Dig Brev)  Ankle NR  <6.0  >2.5 B Fib Ankle  0.0  >40  B Fib NR     Poplt B Fib  0.0  >40  Poplt NR            Site 4    8.3  0.0         Left Peroneal TA Motor (Tib Ant)  Fib Head    3.8 <4.5 0.4 >3 Poplit Fib Head 1.4 10.0 71 >40  Poplit    5.2  0.4         Post-exercise    3.8  1.3         Right  Peroneal TA Motor (Tib Ant)  Fib Head    4.7 <4.5 0.4 >3 Poplit Fib Head 1.2 10.0 83 >40  Poplit    5.9  0.4         Post-exercise    4.6  1.2         Left Tibial Motor (Abd Hall Brev)  Ankle    5.9 <6.0 0.6 >4 Knee Ankle 11.8 47.0 40 >40  Knee    17.7  0.5         Post-exercise    4.4  3.0         Right Tibial Motor (Abd Hall Brev)  Ankle    7.0 <6.0 0.2 >4 Knee Ankle 12.8 46.0 36 >40  Knee    19.8  0.3         Post-exercise    6.6  0.3         Left Ulnar Motor (Abd Dig Minimi)  Wrist    5.5 <3.1 0.5 >7 B Elbow Wrist 6.6 26.0 39 >50  B Elbow    12.1  0.5  Post-exercise B Elbow 6.6 0.0  >50  Post-exercise    5.5  3.4          H Reflex Studies   NR H-Lat (ms) Lat Norm (ms) L-R H-Lat (ms)  Left Tibial (Gastroc)     37.82 <35 0.00  Right Tibial (Gastroc)     37.82 <35 0.00   EMG   Side Muscle Ins Act Fibs Psw Fasc Number Recrt Dur Dur. Amp Amp. Poly Poly. Comment  Left Gastroc Nml Nml Nml Nml Nml Nml Nml Nml Nml Nml Nml Nml N/A  Left Flex Dig Long Nml Nml Nml Nml 1- Rapid Some 1+ Some 1+ Nml Nml N/A  Left AntTibialis Nml Nml Nml Nml 1- Rapid Some 1+ Some 1+ Few 1+ N/A    Right RectFemoris Nml Nml Nml Nml Nml Nml Nml Nml Nml Nml Nml Nml N/A  Right Gastroc Nml Nml Nml Nml Nml Nml Nml Nml Nml Nml Nml Nml N/A  Right GluteusMed Nml Nml Nml Nml Nml Nml Nml Nml Nml Nml Nml Nml N/A  Right BicepsFemS Nml Nml Nml Nml Nml Nml Nml Nml Nml Nml Nml Nml N/A  Right AntTibialis Nml Nml Nml Nml 1- Rapid Some 1+ Some Nml Nml Nml N/A  Right Flex Dig Long Nml Nml Nml Nml 1- Rapid Some 1+ Some 1+ Nml Nml N/A      Waveforms:

## 2015-06-13 ENCOUNTER — Ambulatory Visit: Payer: Medicare Other | Admitting: Neurology

## 2015-06-13 ENCOUNTER — Encounter: Payer: Self-pay | Admitting: Neurology

## 2015-06-13 ENCOUNTER — Ambulatory Visit (HOSPITAL_COMMUNITY): Payer: Medicare Other | Attending: Neurology

## 2015-06-13 ENCOUNTER — Ambulatory Visit (INDEPENDENT_AMBULATORY_CARE_PROVIDER_SITE_OTHER): Payer: Medicare Other | Admitting: Neurology

## 2015-06-13 VITALS — BP 130/72 | HR 83 | Ht 72.0 in | Wt 176.6 lb

## 2015-06-13 DIAGNOSIS — IMO0002 Reserved for concepts with insufficient information to code with codable children: Secondary | ICD-10-CM

## 2015-06-13 DIAGNOSIS — M2560 Stiffness of unspecified joint, not elsewhere classified: Secondary | ICD-10-CM | POA: Insufficient documentation

## 2015-06-13 DIAGNOSIS — M5440 Lumbago with sciatica, unspecified side: Secondary | ICD-10-CM | POA: Insufficient documentation

## 2015-06-13 DIAGNOSIS — M6281 Muscle weakness (generalized): Secondary | ICD-10-CM | POA: Insufficient documentation

## 2015-06-13 DIAGNOSIS — M25659 Stiffness of unspecified hip, not elsewhere classified: Secondary | ICD-10-CM | POA: Diagnosis not present

## 2015-06-13 DIAGNOSIS — G709 Myoneural disorder, unspecified: Secondary | ICD-10-CM

## 2015-06-13 DIAGNOSIS — R69 Illness, unspecified: Secondary | ICD-10-CM | POA: Diagnosis not present

## 2015-06-13 DIAGNOSIS — G609 Hereditary and idiopathic neuropathy, unspecified: Secondary | ICD-10-CM

## 2015-06-13 DIAGNOSIS — R293 Abnormal posture: Secondary | ICD-10-CM | POA: Insufficient documentation

## 2015-06-13 DIAGNOSIS — M2569 Stiffness of other specified joint, not elsewhere classified: Secondary | ICD-10-CM

## 2015-06-13 NOTE — Progress Notes (Signed)
NEUROLOGY FOLLOW UP OFFICE NOTE  Erik [name] [id number]  HISTORY OF PRESENT ILLNESS: Erik [name] is a 69 year old right-handed male with orthostatic hypotension who follows up to review NCV-EMG results for leg weakness and back pain.  History obtained by patient and his daughter.    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  Findings did not show peripheral neuropathy, but it did reveal post-exercise facilitation of motor responses, suggesting a pre-synaptic neuromuscular junction disorder.  He does report that he feels weaker in the mornings, and improves later in the day.  He is a former smoker.  As per EPIC chart, he has had a 14 lb weight loss over the past year, although he and his daughter report a 40 lb weight loss.  He has poor appetite, however.  HISTORY: He developed low back pain about a year ago.  There was no precipitating event.  He reports mid low back pain with diffuse pain radiating down both legs to the feet.  He describes it as a deep burning pain.  It occurs in all positions, either laying down, sitting or ambulating.  He notes some weakness in the legs as well.  He denies numbness.  For the past year, he uses a cane.  He denies bowel or bladder dysfunction but reports impotence over the past year.  He has orthostatic hypotension and takes Florinef and midodrine.  He notes mild neck pain.  MRI of the lumbar spine from 07/17/14 showed spondylosis and degenerative disc disease with disc bulge causing mild bilateral foraminal stenosis at L3-4 and disc bulge at L4-5 causing mild right bilateral foraminal stenosis and right greater than left facet arthropathy.  He saw a neurologist in [place].  NCV-EMG demonstrated demyelinating peripheral neuropathy.  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  CSF protein was 23.  Glucose was 60.  Cell count was 0.  Labs in May revealed CBC with chronic mild  microcytic anemia, Sed Rate 18, Kappa free light chain 23.29, normal IgG, IgA and IgM, normal SPEP and IFE, B12 205.  PAST MEDICAL HISTORY: Past Medical History  Diagnosis Date  . Seizures ([place])     in remote past   . Chronic back pain   . DDD (degenerative disc disease), lumbar   . COPD (chronic obstructive pulmonary disease) ([place])   . Shortness of breath dyspnea   . GERD (gastroesophageal reflux disease)   . Low blood pressure     MEDICATIONS: Current Outpatient Prescriptions on File Prior to Visit  Medication Sig Dispense Refill  . albuterol (PROVENTIL HFA;VENTOLIN HFA) 108 (90 BASE) MCG/ACT inhaler Inhale 2 puffs into the lungs every 6 (six) hours as needed for wheezing or shortness of breath.    Marland Kitchen aspirin EC 81 MG tablet Take 81 mg by mouth daily.    . diclofenac (VOLTAREN) 75 MG EC tablet Take 75 mg by mouth 2 (two) times daily as needed.    . fludrocortisone (FLORINEF) 0.1 MG tablet Take 2 tablets (0.2 mg total) by mouth 2 (two) times daily. (Patient taking differently: Take 0.1 mg by mouth 2 (two) times daily. Take 3 tablets twice daily) 60 tablet 3  . gabapentin (NEURONTIN) 300 MG capsule Take 1cap at bedtime x7days, then 1cap twice daily x7 days, then 1 cap three times daily 90 capsule 0  . midodrine (PROAMATINE) 10 MG tablet Take 1 tablet (10 mg total) by mouth 3 (three) times daily with  meals. 90 tablet 3  . pantoprazole (PROTONIX) 40 MG tablet Take 1 tablet (40 mg total) by mouth daily. 90 tablet 3  . tiotropium (SPIRIVA) 18 MCG inhalation capsule Place 18 mcg into inhaler and inhale daily.    . vitamin B-12 (CYANOCOBALAMIN) 1000 MCG tablet Take 1,000 mcg by mouth daily.    . Zinc 50 MG CAPS Take 50 mg by mouth every morning.     No current facility-administered medications on file prior to visit.    ALLERGIES: No Known Allergies  FAMILY HISTORY: Family History  Problem Relation Age of Onset  . Colon cancer Neg Hx   . Stomach cancer Maternal Grandfather   . Sickle  cell anemia Daughter     SOCIAL HISTORY: Social History   Social History  . Marital Status: Legally Separated    Spouse Name: N/A  . Number of Children: N/A  . Years of Education: N/A   Occupational History  . Not on file.   Social History Main Topics  . Smoking status: Former Smoker    Quit date: 08/11/1983  . Smokeless tobacco: Not on file  . Alcohol Use: No     Comment: remote past, history of ETOH abuse.   . Drug Use: No  . Sexual Activity: Not on file   Other Topics Concern  . Not on file   Social History Narrative    REVIEW OF SYSTEMS: Constitutional: No fevers, chills, or sweats, no generalized fatigue, change in appetite Eyes: No visual changes, double vision, eye pain Ear, nose and throat: No hearing loss, ear pain, nasal congestion, sore throat Cardiovascular: No chest pain, palpitations Respiratory:  No shortness of breath at rest or with exertion, wheezes GastrointestinaI: No nausea, vomiting, diarrhea, abdominal pain, fecal incontinence Genitourinary:  No dysuria, urinary retention or frequency Musculoskeletal:  No neck pain, back pain Integumentary: No rash, pruritus, skin lesions Neurological: as above Psychiatric: No depression, insomnia, anxiety Endocrine: No palpitations, fatigue, diaphoresis, mood swings, change in appetite, change in weight, increased thirst Hematologic/Lymphatic:  No anemia, purpura, petechiae. Allergic/Immunologic: no itchy/runny eyes, nasal congestion, recent allergic reactions, rashes  PHYSICAL EXAM: Filed Vitals:   06/13/15 1141  BP: 130/72  Pulse: 83   General: No acute distress.   Head:  Normocephalic/atraumatic  IMPRESSION: Pre-synaptic neuromuscular junction disorder, such as Lambert-Eaton Myasthenic Syndrome.  PLAN: 1.  We will check P/Q-type voltage-gated calcium channel antibodies 2.  Since this is often a paraneoplastic disease, we will get CT of chest, abdomen and pelvis, to screen for malignancy 3.   Follow up afterwards  15 minutes spent face to face with patient, 100% spent discussing NCV-EMG results, differential diagnosis and plan.  I answered all questions to the best of my ability.  Metta Clines, DO  CC:  [name], NP  Laban Emperor, NP

## 2015-06-13 NOTE — Therapy (Signed)
[place], [place], [address] Phone: [phone]   Fax:  [phone]  Physical Therapy Treatment  Patient Details  Name: Erik [name] MRN: [id number] Date of Birth: 05-31-1946 Referring Provider: [name], DO   Encounter Date: 06/13/2015      PT End of Session - 06/13/15 1528    Visit Number 3   Number of Visits 12   Date for PT Re-Evaluation 07/03/15   Authorization Type Medicare/Medicaid    Authorization Time Period 06/05/15 to 08/05/15   Authorization - Visit Number 3   Authorization - Number of Visits 10   PT Start Time 1520   PT Stop Time 1558   PT Time Calculation (min) 38 min   Activity Tolerance Patient tolerated treatment well   Behavior During Therapy [facility] for tasks assessed/performed      Past Medical History  Diagnosis Date  . Seizures ([place])     in remote past   . Chronic back pain   . DDD (degenerative disc disease), lumbar   . COPD (chronic obstructive pulmonary disease) ([place])   . Shortness of breath dyspnea   . GERD (gastroesophageal reflux disease)   . Low blood pressure     Past Surgical History  Procedure Laterality Date  . None    . Colonoscopy N/A 11/12/2014    Dr. Clayburn Pert external and internal hemorrhoids, redundant left colon  . Esophagogastroduodenoscopy N/A 11/12/2014    Dr. Barnie Alderman non-erosive gastritis. negative H.pylori   . Esophageal dilation N/A 11/12/2014    Procedure: ESOPHAGEAL DILATION;  Surgeon: Danie Binder, MD;  Location[facility];  Service: Endoscopy;  Laterality: N/A;    There were no vitals filed for this visit.  Visit Diagnosis:  Midline low back pain with sciatica, sciatica laterality unspecified  Muscle weakness  Back stiffness  Poor posture  Hip stiffness, unspecified laterality      Subjective Assessment - 06/13/15 1527    Subjective Pt stated he is just a little sore today, no reports of pain.  PT stated he was tired  today,went to MD in [place] prior apt.   Currently in Pain? No/denies   Pain Descriptors / Indicators Sore           [facility] Adult PT Treatment/Exercise - 06/13/15 0001    Exercises   Exercises Lumbar   Lumbar Exercises: Stretches   Active Hamstring Stretch 3 reps;30 seconds   Active Hamstring Stretch Limitations supine   Single Knee to Chest Stretch 2 reps;30 seconds   Lower Trunk Rotation 5 reps;10 seconds   Lumbar Exercises: Standing   Other Standing Lumbar Exercises weight shifting   Other Standing Lumbar Exercises attempted squats with multimodal cueing, unable to complete with good form; wall squats 5x 5"   Lumbar Exercises: Seated   Other Seated Lumbar Exercises Education on proper seated posture   Other Seated Lumbar Exercises 10x rows   Lumbar Exercises: Supine   Ab Set 10 reps;5 seconds   AB Set Limitations verbal and tactile cueing   Bent Knee Raise 10 reps   Bent Knee Raise Limitations cueing for stabilty   Bridge 10 reps   Straight Leg Raise 5 reps   Straight Leg Raises Limitations floating    Other Supine Lumbar Exercises pelvic tilts 5x 5"   Lumbar Exercises: Sidelying   Hip Abduction 10 reps            PT Short Term Goals - 06/05/15 1638  PT SHORT TERM GOAL #1   Title Patient will be able to verbalize the importance of good posture, and will be able to maintain improved posture in sitting and standing with cues no more than 20% of the time    Time 3   Period Weeks   Status New   PT SHORT TERM GOAL #2   Title Patient will improve bilateral hip internal rotation to at least 42 degrees to improve functional mechanics and mobility    Time 3   Period Weeks   Status New   PT SHORT TERM GOAL #3   Title Patient will improve lumbar rotation to being only 30% limited bilaterally in order to improve functional mechanics and reduce pain    Time 3   Period Weeks   Status New   PT SHORT TERM GOAL #4   Title patient will be independent in correctly and  consistently performing HEP, to be updated PRN    Time 3   Period Weeks   Status New           PT Long Term Goals - 06/05/15 1642    PT LONG TERM GOAL #1   Title Patient will demonstrate 5/5 muscle srength in all muscles tested in order to reduce pain and improve functional mechanics    Time 6   Period Weeks   Status New   PT LONG TERM GOAL #2   Title Patient will report that his pain is 0/10 at least 80% of the time with 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    Time 6   Period Weeks   Status New   PT LONG TERM GOAL #3   Title patient will demonstrate reduced rigidity in lumbar and sacral PAs in order to evidence improved spinal mobiltiy and contribute to improved function and overall reduced pain    Time 8   Period Weeks   Status New   PT LONG TERM GOAL #4   Title Patient will perform at least 30 minutes of exercise of light to moderate intensity at least 3 days per week in order to maintain functional gains and promote overall improved health status    Time 6   Period Weeks   Status New               Plan - 06/13/15 1538    Clinical Impression Statement Pt with reports of improved sx this session, no reports of radicular sx through session.  Session foucs on improving spinal mobility wtih stretches and stability exercises to improve core strengthening with verbal and tactile cueing to improve stabilty and form with exercises.  Pt unable to complete squats with proper form and technique with multimodal cueing including demonstration, verbal cueing and chair behind to simulate appropriate position.  Began wall slides for gluteal strengthening.  Pt limited by fatigue this session, unable to complete full PT POC.     PT Next Visit Plan Pt will need continued instruction for functional squatting as pt continues to flex his spine.         Problem List Patient Active Problem List   Diagnosis Date Noted  . Absolute anemia  10/26/2014  . Loss of weight 10/26/2014  . Dysphagia, pharyngoesophageal phase 10/26/2014  . Low back pain 07/10/2014  . Orthostatic hypotension 07/07/2014  . Orthostasis 07/07/2014   Ihor Austin, LPTA; [place]  Aldona Lento 06/13/2015, 4:03 PM  [place]  [address] [place], [place], [address] Phone: [phone]   Fax:  [phone]  Name: DASHTON CZERWINSKI MRN: [id number] Date of Birth: 03/02/1946

## 2015-06-13 NOTE — Patient Instructions (Signed)
You may have a disorder of the neuromuscular junction.  Specifically, you may have a condition called Lambert-Eaton Myasthenic Syndrome. 1.  We will check a blood test looking for antibodies to this disease (P/Q type voltage-gated calcium channel antibodies) 2.  We will also check CT of chest, abdomen and pelvis to look for possible malignancy 3.  Follow up after testing.

## 2015-06-14 ENCOUNTER — Telehealth: Payer: Self-pay

## 2015-06-14 DIAGNOSIS — IMO0002 Reserved for concepts with insufficient information to code with codable children: Secondary | ICD-10-CM

## 2015-06-14 DIAGNOSIS — G709 Myoneural disorder, unspecified: Secondary | ICD-10-CM

## 2015-06-14 DIAGNOSIS — R1319 Other dysphagia: Secondary | ICD-10-CM

## 2015-06-14 DIAGNOSIS — D573 Sickle-cell trait: Secondary | ICD-10-CM

## 2015-06-14 DIAGNOSIS — M6281 Muscle weakness (generalized): Secondary | ICD-10-CM

## 2015-06-14 DIAGNOSIS — R634 Abnormal weight loss: Secondary | ICD-10-CM

## 2015-06-14 DIAGNOSIS — Z9189 Other specified personal risk factors, not elsewhere classified: Secondary | ICD-10-CM

## 2015-06-14 DIAGNOSIS — R1310 Dysphagia, unspecified: Secondary | ICD-10-CM

## 2015-06-14 DIAGNOSIS — R0602 Shortness of breath: Secondary | ICD-10-CM

## 2015-06-14 DIAGNOSIS — M5440 Lumbago with sciatica, unspecified side: Secondary | ICD-10-CM

## 2015-06-14 DIAGNOSIS — G609 Hereditary and idiopathic neuropathy, unspecified: Secondary | ICD-10-CM

## 2015-06-14 NOTE — Telephone Encounter (Signed)
CTs scheduled for Monday at 4 p.m. (arrival at 3:45) @ [facility]. Needs blood work prior. Orders faxed to [facility] in [place]. Future order placed in case pt doesn't get blood done in time and Forestine Na has to drawl. Niece and patient were both notified.

## 2015-06-14 NOTE — Telephone Encounter (Signed)
-----   Message from [name], DO sent at 06/14/2015  7:04 AM EDT ----- Regarding: RE: Diagnosis Code Try paraneoplastic syndrome. ----- Message -----    From: Amada Kingfisher, CMA    Sent: 06/13/2015   1:47 PM      To: [name], DO Subject: Diagnosis Code                                 For Erik [name]  CT of chest, abdomen and pelvis to look for possible malignancy (which will be ordered w/ and w/o) I am unsure what dx to use. The two from today's visit wont cover the CT. DX of possible secondary cancer will also not cover the scans. Any suggestions?

## 2015-06-14 NOTE — Progress Notes (Signed)
Chart forwarded.  

## 2015-06-17 ENCOUNTER — Ambulatory Visit (HOSPITAL_COMMUNITY): Admission: RE | Admit: 2015-06-17 | Payer: Medicare Other | Source: Ambulatory Visit

## 2015-06-17 DIAGNOSIS — R69 Illness, unspecified: Secondary | ICD-10-CM | POA: Diagnosis not present

## 2015-06-17 DIAGNOSIS — G609 Hereditary and idiopathic neuropathy, unspecified: Secondary | ICD-10-CM | POA: Diagnosis not present

## 2015-06-17 DIAGNOSIS — G709 Myoneural disorder, unspecified: Secondary | ICD-10-CM | POA: Diagnosis not present

## 2015-06-18 ENCOUNTER — Ambulatory Visit (HOSPITAL_COMMUNITY): Payer: Medicare Other | Admitting: Physical Therapy

## 2015-06-18 ENCOUNTER — Telehealth: Payer: Self-pay

## 2015-06-18 DIAGNOSIS — M2560 Stiffness of unspecified joint, not elsewhere classified: Secondary | ICD-10-CM | POA: Diagnosis not present

## 2015-06-18 DIAGNOSIS — R293 Abnormal posture: Secondary | ICD-10-CM

## 2015-06-18 DIAGNOSIS — M25659 Stiffness of unspecified hip, not elsewhere classified: Secondary | ICD-10-CM | POA: Diagnosis not present

## 2015-06-18 DIAGNOSIS — G609 Hereditary and idiopathic neuropathy, unspecified: Secondary | ICD-10-CM

## 2015-06-18 DIAGNOSIS — R0602 Shortness of breath: Secondary | ICD-10-CM

## 2015-06-18 DIAGNOSIS — M6281 Muscle weakness (generalized): Secondary | ICD-10-CM | POA: Diagnosis not present

## 2015-06-18 DIAGNOSIS — R634 Abnormal weight loss: Secondary | ICD-10-CM

## 2015-06-18 DIAGNOSIS — M5440 Lumbago with sciatica, unspecified side: Secondary | ICD-10-CM | POA: Diagnosis not present

## 2015-06-18 DIAGNOSIS — IMO0002 Reserved for concepts with insufficient information to code with codable children: Secondary | ICD-10-CM

## 2015-06-18 DIAGNOSIS — M2569 Stiffness of other specified joint, not elsewhere classified: Secondary | ICD-10-CM

## 2015-06-18 DIAGNOSIS — G709 Myoneural disorder, unspecified: Secondary | ICD-10-CM

## 2015-06-18 NOTE — Telephone Encounter (Signed)
-----   Message from Sammuel Bailiff sent at 06/18/2015  9:13 AM EST ----- Regarding: Correction of order Contact: [phone] Per [facility] Radiology protocol we do not perform CT Chest w/o and w/, CT Abdomen/Pelvis w/o and w contrast per reason for exam. W/o and w/ contrast refers to IV contrast only and requires additional imaging which is addition radiation. I spoke with Dr. Thornton Papas and he called another radiologist to confirm that this needed to be changed to CT Chest/Abdomen/Pelvis with contrast-pt will still receive IV and oral contrast.

## 2015-06-18 NOTE — Therapy (Signed)
[place], [place], [address] Phone: [phone]   Fax:  [phone]  Physical Therapy Treatment  Patient Details  Name: Erik [name] MRN: [id number] Date of Birth: 1945-09-08 Referring Provider: [name], DO   Encounter Date: 06/18/2015      PT End of Session - 06/18/15 1517    Visit Number 4   Number of Visits 12   Date for PT Re-Evaluation 07/03/15   Authorization Type Medicare/Medicaid    Authorization Time Period 06/05/15 to 08/05/15   Authorization - Visit Number 4   Authorization - Number of Visits 10   PT Start Time 1440   PT Stop Time 1518   PT Time Calculation (min) 38 min   Activity Tolerance Patient tolerated treatment well   Behavior During Therapy [facility] for tasks assessed/performed      Past Medical History  Diagnosis Date  . Seizures ([place])     in remote past   . Chronic back pain   . DDD (degenerative disc disease), lumbar   . COPD (chronic obstructive pulmonary disease) ([place])   . Shortness of breath dyspnea   . GERD (gastroesophageal reflux disease)   . Low blood pressure     Past Surgical History  Procedure Laterality Date  . None    . Colonoscopy N/A 11/12/2014    Dr. Clayburn Pert external and internal hemorrhoids, redundant left colon  . Esophagogastroduodenoscopy N/A 11/12/2014    Dr. Barnie Alderman non-erosive gastritis. negative H.pylori   . Esophageal dilation N/A 11/12/2014    Procedure: ESOPHAGEAL DILATION;  Surgeon: Danie Binder, MD;  [facility];  Service: Endoscopy;  Laterality: N/A;    There were no vitals filed for this visit.  Visit Diagnosis:  Midline low back pain with sciatica, sciatica laterality unspecified  Muscle weakness  Back stiffness  Poor posture  Hip stiffness, unspecified laterality      Subjective Assessment - 06/18/15 1446    Subjective Pt states he is not really hurting today, just has soreness central sacral area "bout as  big around as a golf ball".   Currently in Pain? No/denies                         [facility] Adult PT Treatment/Exercise - 06/18/15 1512    Lumbar Exercises: Stretches   Active Hamstring Stretch 3 reps;30 seconds   Active Hamstring Stretch Limitations supine with rope   Single Knee to Chest Stretch 2 reps;30 seconds   Lower Trunk Rotation 5 reps;10 seconds   Piriformis Stretch 2 reps;30 seconds   Piriformis Stretch Limitations seated   Lumbar Exercises: Standing   Functional Squats 5 reps   Functional Squats Limitations in front of mat to improve form   Lumbar Exercises: Supine   Ab Set 10 reps;5 seconds   AB Set Limitations verbal and tactile cueing   Bent Knee Raise 10 reps   Bridge 15 reps   Straight Leg Raise 5 reps   Straight Leg Raises Limitations 2 sets floating    Other Supine Lumbar Exercises pelvic tilts 10x 5"   Lumbar Exercises: Sidelying   Hip Abduction 15 reps                  PT Short Term Goals - 06/05/15 1638    PT SHORT TERM GOAL #1   Title Patient will be able to verbalize the importance of good posture, and will be able to  maintain improved posture in sitting and standing with cues no more than 20% of the time    Time 3   Period Weeks   Status New   PT SHORT TERM GOAL #2   Title Patient will improve bilateral hip internal rotation to at least 42 degrees to improve functional mechanics and mobility    Time 3   Period Weeks   Status New   PT SHORT TERM GOAL #3   Title Patient will improve lumbar rotation to being only 30% limited bilaterally in order to improve functional mechanics and reduce pain    Time 3   Period Weeks   Status New   PT SHORT TERM GOAL #4   Title patient will be independent in correctly and consistently performing HEP, to be updated PRN    Time 3   Period Weeks   Status New           PT Long Term Goals - 06/05/15 1642    PT LONG TERM GOAL #1   Title Patient will demonstrate 5/5 muscle srength in all  muscles tested in order to reduce pain and improve functional mechanics    Time 6   Period Weeks   Status New   PT LONG TERM GOAL #2   Title Patient will report that his pain is 0/10 at least 80% of the time with 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    Time 6   Period Weeks   Status New   PT LONG TERM GOAL #3   Title patient will demonstrate reduced rigidity in lumbar and sacral PAs in order to evidence improved spinal mobiltiy and contribute to improved function and overall reduced pain    Time 8   Period Weeks   Status New   PT LONG TERM GOAL #4   Title Patient will perform at least 30 minutes of exercise of light to moderate intensity at least 3 days per week in order to maintain functional gains and promote overall improved health status    Time 6   Period Weeks   Status New               Plan - 06/18/15 1517    Clinical Impression Statement Pt with improved form completing squats when done in front of mat, however crepitus in knees noted but without pain.  Pt continues to require verbal and tactile cues from therapist with therex due to improper form and hold times.   Pt  with overall decreased soreness reported in central low back at end of session.    PT Next Visit Plan Continue to progress functional strength and improve form with therex.         Problem List Patient Active Problem List   Diagnosis Date Noted  . Absolute anemia 10/26/2014  . Loss of weight 10/26/2014  . Dysphagia, pharyngoesophageal phase 10/26/2014  . Low back pain 07/10/2014  . Orthostatic hypotension 07/07/2014  . Orthostasis 07/07/2014    Teena Irani, PTA/CLT [phone]  06/18/2015, 3:36 PM  [facility] [address] [place], [place], [address] Phone: [phone]   Fax:  [phone]  Name: Erik [name] MRN: [id number] Date of Birth: Sep 22, 1945

## 2015-06-19 ENCOUNTER — Encounter: Payer: Self-pay | Admitting: Gastroenterology

## 2015-06-19 ENCOUNTER — Telehealth: Payer: Self-pay | Admitting: Gastroenterology

## 2015-06-19 ENCOUNTER — Ambulatory Visit: Payer: Medicare Other | Admitting: Gastroenterology

## 2015-06-19 NOTE — Telephone Encounter (Signed)
CALLED PATIENT AND RESCHEDULED HIM TO LATER APPT

## 2015-06-19 NOTE — Telephone Encounter (Signed)
PATIENT WAS A NO SHOW AND LETTER SENT  °

## 2015-06-20 ENCOUNTER — Ambulatory Visit (HOSPITAL_COMMUNITY): Admission: RE | Admit: 2015-06-20 | Payer: Medicare Other | Source: Ambulatory Visit

## 2015-06-20 ENCOUNTER — Ambulatory Visit (HOSPITAL_COMMUNITY): Payer: Medicare Other | Admitting: Physical Therapy

## 2015-06-20 ENCOUNTER — Other Ambulatory Visit (HOSPITAL_COMMUNITY): Payer: Medicare Other

## 2015-06-20 ENCOUNTER — Ambulatory Visit (HOSPITAL_COMMUNITY)
Admission: RE | Admit: 2015-06-20 | Discharge: 2015-06-20 | Disposition: A | Discharge status: Home / Self Care | Payer: Medicare Other | Source: Ambulatory Visit | Attending: Neurology | Admitting: Neurology

## 2015-06-20 DIAGNOSIS — R293 Abnormal posture: Secondary | ICD-10-CM | POA: Diagnosis not present

## 2015-06-20 DIAGNOSIS — R911 Solitary pulmonary nodule: Secondary | ICD-10-CM | POA: Diagnosis not present

## 2015-06-20 DIAGNOSIS — R634 Abnormal weight loss: Secondary | ICD-10-CM

## 2015-06-20 DIAGNOSIS — M2560 Stiffness of unspecified joint, not elsewhere classified: Secondary | ICD-10-CM | POA: Diagnosis not present

## 2015-06-20 DIAGNOSIS — G609 Hereditary and idiopathic neuropathy, unspecified: Secondary | ICD-10-CM | POA: Diagnosis not present

## 2015-06-20 DIAGNOSIS — IMO0002 Reserved for concepts with insufficient information to code with codable children: Secondary | ICD-10-CM

## 2015-06-20 DIAGNOSIS — J439 Emphysema, unspecified: Secondary | ICD-10-CM | POA: Insufficient documentation

## 2015-06-20 DIAGNOSIS — G709 Myoneural disorder, unspecified: Secondary | ICD-10-CM | POA: Diagnosis not present

## 2015-06-20 DIAGNOSIS — M2569 Stiffness of other specified joint, not elsewhere classified: Secondary | ICD-10-CM

## 2015-06-20 DIAGNOSIS — M47896 Other spondylosis, lumbar region: Secondary | ICD-10-CM | POA: Insufficient documentation

## 2015-06-20 DIAGNOSIS — M5440 Lumbago with sciatica, unspecified side: Secondary | ICD-10-CM | POA: Diagnosis not present

## 2015-06-20 DIAGNOSIS — M25659 Stiffness of unspecified hip, not elsewhere classified: Secondary | ICD-10-CM

## 2015-06-20 DIAGNOSIS — R69 Illness, unspecified: Secondary | ICD-10-CM | POA: Diagnosis present

## 2015-06-20 DIAGNOSIS — M6281 Muscle weakness (generalized): Secondary | ICD-10-CM | POA: Diagnosis not present

## 2015-06-20 DIAGNOSIS — I700 Atherosclerosis of aorta: Secondary | ICD-10-CM | POA: Diagnosis not present

## 2015-06-20 DIAGNOSIS — R0602 Shortness of breath: Secondary | ICD-10-CM

## 2015-06-20 LAB — POCT I-STAT CREATININE: CREATININE: 1.3 mg/dL — AB (ref 0.61–1.24)

## 2015-06-20 MED ORDER — IOHEXOL 300 MG/ML  SOLN
100.0000 mL | Freq: Once | INTRAMUSCULAR | Status: AC | PRN
  Administered 2015-06-20: 100 mL via INTRAVENOUS

## 2015-06-20 NOTE — Therapy (Signed)
[place], [place], [address] Phone: [phone]   Fax:  [phone]  Physical Therapy Treatment  Patient Details  Name: Erik [name] MRN: [id number] Date of Birth: [birth year] Referring Provider: [name], DO   Encounter Date: 06/20/2015      PT End of Session - 06/20/15 1606    Visit Number 5   Number of Visits 12   Date for PT Re-Evaluation 07/03/15   Authorization Type Medicare/Medicaid    Authorization Time Period 06/05/15 to 08/05/15   Authorization - Visit Number 5   Authorization - Number of Visits 10   PT Start Time 7741   PT Stop Time 1555   PT Time Calculation (min) 32 min   Activity Tolerance Patient tolerated treatment well   Behavior During Therapy [facility] for tasks assessed/performed      Past Medical History  Diagnosis Date  . Seizures ([place])     in remote past   . Chronic back pain   . DDD (degenerative disc disease), lumbar   . COPD (chronic obstructive pulmonary disease) ([place])   . Shortness of breath dyspnea   . GERD (gastroesophageal reflux disease)   . Low blood pressure     Past Surgical History  Procedure Laterality Date  . None    . Colonoscopy N/A 11/12/2014    Dr. Clayburn Pert external and internal hemorrhoids, redundant left colon  . Esophagogastroduodenoscopy N/A 11/12/2014    Dr. Barnie Alderman non-erosive gastritis. negative H.pylori   . Esophageal dilation N/A 11/12/2014    Procedure: ESOPHAGEAL DILATION;  Surgeon: Danie Binder, MD;  [facility];  Service: Endoscopy;  Laterality: N/A;    There were no vitals filed for this visit.  Visit Diagnosis:  Midline low back pain with sciatica, sciatica laterality unspecified  Muscle weakness  Back stiffness  Poor posture  Hip stiffness, unspecified laterality      Subjective Assessment - 06/20/15 1524    Subjective Patient reports no pain today, doing pretty well overall    Pertinent History Patient  reports that his back has been hurting for awhile; really started getting worse last October. Can't remember doing anything that made his back worse, just started out of nowhere. States that awhile ago MD said not to pick up anything over 10 pounds. No hip problems. Pain does run down his legs on both sides, front and back.    Currently in Pain? No/denies                         [facility] Adult PT Treatment/Exercise - 06/20/15 0001    Lumbar Exercises: Stretches   Active Hamstring Stretch 3 reps;30 seconds   Active Hamstring Stretch Limitations 12 inch box    Passive Hamstring Stretch 3 reps;30 seconds   Passive Hamstring Stretch Limitations gastroc on slantboard    Single Knee to Chest Stretch 5 reps;10 seconds   Lower Trunk Rotation 5 reps;10 seconds   Piriformis Stretch 2 reps;30 seconds   Piriformis Stretch Limitations seated   Lumbar Exercises: Standing   Other Standing Lumbar Exercises 3D hip excursions 1x10   Lumbar Exercises: Seated   Other Seated Lumbar Exercises alternating UE and LE flexion with core activation 1x10   Lumbar Exercises: Supine   Ab Set 20 reps   AB Set Limitations 3 second holds    Bent Knee Raise 15 reps   Bridge 15 reps  PT Education - 06/20/15 1606    Education provided No          PT Short Term Goals - 06/05/15 1638    PT SHORT TERM GOAL #1   Title Patient will be able to verbalize the importance of good posture, and will be able to maintain improved posture in sitting and standing with cues no more than 20% of the time    Time 3   Period Weeks   Status New   PT SHORT TERM GOAL #2   Title Patient will improve bilateral hip internal rotation to at least 42 degrees to improve functional mechanics and mobility    Time 3   Period Weeks   Status New   PT SHORT TERM GOAL #3   Title Patient will improve lumbar rotation to being only 30% limited bilaterally in order to improve functional mechanics and reduce pain     Time 3   Period Weeks   Status New   PT SHORT TERM GOAL #4   Title patient will be independent in correctly and consistently performing HEP, to be updated PRN    Time 3   Period Weeks   Status New           PT Long Term Goals - 06/05/15 1642    PT LONG TERM GOAL #1   Title Patient will demonstrate 5/5 muscle srength in all muscles tested in order to reduce pain and improve functional mechanics    Time 6   Period Weeks   Status New   PT LONG TERM GOAL #2   Title Patient will report that his pain is 0/10 at least 80% of the time with 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    Time 6   Period Weeks   Status New   PT LONG TERM GOAL #3   Title patient will demonstrate reduced rigidity in lumbar and sacral PAs in order to evidence improved spinal mobiltiy and contribute to improved function and overall reduced pain    Time 8   Period Weeks   Status New   PT LONG TERM GOAL #4   Title Patient will perform at least 30 minutes of exercise of light to moderate intensity at least 3 days per week in order to maintain functional gains and promote overall improved health status    Time 6   Period Weeks   Status New               Plan - 06/20/15 1607    Clinical Impression Statement Patient arrived a few minutes late today and was fatigued throughout session. Focused on core exercises today as well as on some hip mobility in standing with good tolerance by patient however he did require cues for form and proper pacing of exercises. NO pain at end of session.    Pt will benefit from skilled therapeutic intervention in order to improve on the following deficits Abnormal gait;Hypomobility;Decreased activity tolerance;Decreased strength;Pain;Decreased balance;Decreased mobility;Difficulty walking;Improper body mechanics;Decreased coordination;Impaired flexibility;Postural dysfunction   Rehab Potential Excellent   PT Frequency 2x / week   PT  Duration 6 weeks   PT Treatment/Interventions ADLs/Self Care Home Management;Gait training;Stair training;Functional mobility training;Therapeutic activities;Therapeutic exercise;Balance training;Neuromuscular re-education;Patient/family education;Manual techniques   PT Next Visit Plan Continue to progress functional strength and improve form with therex.    PT Home Exercise Plan given    Consulted and Agree with Plan of Care Patient  Problem List Patient Active Problem List   Diagnosis Date Noted  . Absolute anemia 10/26/2014  . Loss of weight 10/26/2014  . Dysphagia, pharyngoesophageal phase 10/26/2014  . Low back pain 07/10/2014  . Orthostatic hypotension 07/07/2014  . Orthostasis 07/07/2014    Deniece Ree PT, DPT [facility] [address] [place], [place], [address] Phone: [phone]   Fax:  [phone]  Name: ZEDRIC DEROY MRN: [id number] Date of Birth: Sep 03, 1945

## 2015-06-21 ENCOUNTER — Telehealth: Payer: Self-pay | Admitting: Neurology

## 2015-06-21 DIAGNOSIS — R911 Solitary pulmonary nodule: Secondary | ICD-10-CM

## 2015-06-21 DIAGNOSIS — G7080 Lambert-Eaton syndrome, unspecified: Secondary | ICD-10-CM

## 2015-06-21 NOTE — Telephone Encounter (Signed)
Oncology referral placed to [facility] due to location.

## 2015-06-21 NOTE — Telephone Encounter (Signed)
I called and spoke with Erik [name] regarding results of the labs and CT.  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  CT of the chest did reveal a small lung nodule.  This may be of uncertain clinical significance but since Lambert-Eaton is strongly associated with malignancy, I told him we will refer him to oncology.  I answered all questions to the best of my ability.

## 2015-06-21 NOTE — Addendum Note (Signed)
Addended by: Gerda Diss A on: 06/21/2015 12:44 PM   Modules accepted: Orders

## 2015-06-24 ENCOUNTER — Ambulatory Visit (HOSPITAL_COMMUNITY): Payer: Medicare Other | Admitting: Physical Therapy

## 2015-06-24 DIAGNOSIS — M5440 Lumbago with sciatica, unspecified side: Secondary | ICD-10-CM | POA: Diagnosis not present

## 2015-06-24 DIAGNOSIS — M6281 Muscle weakness (generalized): Secondary | ICD-10-CM | POA: Diagnosis not present

## 2015-06-24 DIAGNOSIS — R293 Abnormal posture: Secondary | ICD-10-CM

## 2015-06-24 DIAGNOSIS — M2560 Stiffness of unspecified joint, not elsewhere classified: Secondary | ICD-10-CM | POA: Diagnosis not present

## 2015-06-24 DIAGNOSIS — M2569 Stiffness of other specified joint, not elsewhere classified: Secondary | ICD-10-CM

## 2015-06-24 DIAGNOSIS — M25659 Stiffness of unspecified hip, not elsewhere classified: Secondary | ICD-10-CM

## 2015-06-24 NOTE — Therapy (Signed)
[place], [place], [address] Phone: [phone]   Fax:  [phone]  Physical Therapy Treatment  Patient Details  Name: Erik [name] MRN: [id number] Date of Birth: 1945-08-23 Referring Provider: [name], DO   Encounter Date: 06/24/2015      PT End of Session - 06/24/15 1547    Visit Number 6   Number of Visits 12   Date for PT Re-Evaluation 07/03/15   Authorization Type Medicare/Medicaid    Authorization Time Period 06/05/15 to 08/05/15   Authorization - Visit Number 6   Authorization - Number of Visits 10   PT Start Time 0109   PT Stop Time 1528   PT Time Calculation (min) 42 min   Activity Tolerance Patient tolerated treatment well   Behavior During Therapy [facility] for tasks assessed/performed      Past Medical History  Diagnosis Date  . Seizures ([place])     in remote past   . Chronic back pain   . DDD (degenerative disc disease), lumbar   . COPD (chronic obstructive pulmonary disease) ([place])   . Shortness of breath dyspnea   . GERD (gastroesophageal reflux disease)   . Low blood pressure     Past Surgical History  Procedure Laterality Date  . None    . Colonoscopy N/A 11/12/2014    Dr. Clayburn Pert external and internal hemorrhoids, redundant left colon  . Esophagogastroduodenoscopy N/A 11/12/2014    Dr. Barnie Alderman non-erosive gastritis. negative H.pylori   . Esophageal dilation N/A 11/12/2014    Procedure: ESOPHAGEAL DILATION;  Surgeon: Danie Binder, MD;  [facility];  Service: Endoscopy;  Laterality: N/A;    There were no vitals filed for this visit.  Visit Diagnosis:  Midline low back pain with sciatica, sciatica laterality unspecified  Muscle weakness  Back stiffness  Hip stiffness, unspecified laterality  Poor posture      Subjective Assessment - 06/24/15 1456    Subjective Pt states he is doing fairly well.  STates his pain is still in the same centralized  location but more of a soreness than pain. Rated 5/10 today.   Currently in Pain? Yes   Pain Score 5    Pain Location Back   Pain Orientation Mid;Lower   Pain Descriptors / Indicators Sore                         [facility] Adult PT Treatment/Exercise - 06/24/15 1500    Lumbar Exercises: Stretches   Active Hamstring Stretch 3 reps;30 seconds   Active Hamstring Stretch Limitations 12 inch box    Passive Hamstring Stretch 3 reps;30 seconds   Passive Hamstring Stretch Limitations gastroc on slantboard    Lumbar Exercises: Standing   Functional Squats 10 reps   Functional Squats Limitations with tactile cues from therapist   Other Standing Lumbar Exercises lateral step ups and step downs 4" 10 reps each 1 HHA   Lumbar Exercises: Supine   Bridge 15 reps   Straight Leg Raise 10 reps   Straight Leg Raises Limitations 2 sets 5 floating    Lumbar Exercises: Sidelying   Hip Abduction 15 reps   Lumbar Exercises: Prone   Straight Leg Raise 10 reps                  PT Short Term Goals - 06/05/15 1638    PT SHORT TERM GOAL #1   Title Patient will be  able to verbalize the importance of good posture, and will be able to maintain improved posture in sitting and standing with cues no more than 20% of the time    Time 3   Period Weeks   Status New   PT SHORT TERM GOAL #2   Title Patient will improve bilateral hip internal rotation to at least 42 degrees to improve functional mechanics and mobility    Time 3   Period Weeks   Status New   PT SHORT TERM GOAL #3   Title Patient will improve lumbar rotation to being only 30% limited bilaterally in order to improve functional mechanics and reduce pain    Time 3   Period Weeks   Status New   PT SHORT TERM GOAL #4   Title patient will be independent in correctly and consistently performing HEP, to be updated PRN    Time 3   Period Weeks   Status New           PT Long Term Goals - 06/05/15 1642    PT LONG TERM GOAL  #1   Title Patient will demonstrate 5/5 muscle srength in all muscles tested in order to reduce pain and improve functional mechanics    Time 6   Period Weeks   Status New   PT LONG TERM GOAL #2   Title Patient will report that his pain is 0/10 at least 80% of the time with 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    Time 6   Period Weeks   Status New   PT LONG TERM GOAL #3   Title patient will demonstrate reduced rigidity in lumbar and sacral PAs in order to evidence improved spinal mobiltiy and contribute to improved function and overall reduced pain    Time 8   Period Weeks   Status New   PT LONG TERM GOAL #4   Title Patient will perform at least 30 minutes of exercise of light to moderate intensity at least 3 days per week in order to maintain functional gains and promote overall improved health status    Time 6   Period Weeks   Status New               Plan - 06/24/15 1548    Clinical Impression Statement Continued focus on strengthening core and LE's while stretching tight musculature.  Added lateral step ups and forward step downs to work on eccentric LE control.  PT able to complete all 10 reps with SLR today without rest and improved form with therex.  Continued need to tactile cues to maintain correct form wtih squats and  sidelying hip abduction.  No pain at end of session.   PT Next Visit Plan Continue to progress functional strength and improve form with therex. Add lunges next session.   Consulted and Agree with Plan of Care Patient        Problem List Patient Active Problem List   Diagnosis Date Noted  . Absolute anemia 10/26/2014  . Loss of weight 10/26/2014  . Dysphagia, pharyngoesophageal phase 10/26/2014  . Low back pain 07/10/2014  . Orthostatic hypotension 07/07/2014  . Orthostasis 07/07/2014    Teena Irani, PTA/CLT [phone]  06/24/2015, 3:56 PM  [facility] [address] [place], [place], [address] Phone: [phone]   Fax:  [phone]  Name: Erik [name] MRN: [id number] Date of Birth: [birth year]

## 2015-06-25 ENCOUNTER — Encounter (HOSPITAL_COMMUNITY): Payer: Self-pay | Admitting: Oncology

## 2015-06-25 DIAGNOSIS — D509 Iron deficiency anemia, unspecified: Secondary | ICD-10-CM | POA: Insufficient documentation

## 2015-06-25 DIAGNOSIS — E538 Deficiency of other specified B group vitamins: Secondary | ICD-10-CM

## 2015-06-25 DIAGNOSIS — G7080 Lambert-Eaton syndrome, unspecified: Secondary | ICD-10-CM | POA: Insufficient documentation

## 2015-06-25 HISTORY — DX: Lambert-Eaton syndrome, unspecified: G70.80

## 2015-06-25 HISTORY — DX: Deficiency of other specified B group vitamins: E53.8

## 2015-06-25 HISTORY — DX: Iron deficiency anemia, unspecified: D50.9

## 2015-06-25 NOTE — Assessment & Plan Note (Addendum)
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  Very rarely do individuals with sickle cell trait demonstrate symptoms of sickle cell disease such as anemia or acute chest syndrome.  As a result, we cannot explain the patient's mild microcytic anemia on sickle cell trait.   Labs today: CBC diff, CMET  Will add a pathologist smear review to the patient's lab work today.  Due to leukocytosis without any new medications or infectious symptoms/findings, repeat CBC diff in 4 weeks with a peripheral flow cytometry.  With persistent anemia and patient noting black stools, will send patient home today with stool cards x 3 evaluate for occult GI blood loss.  Labs in 4 months: CBC diff, CMET.  Return in 4 months for follow-up, sooner if needed

## 2015-06-25 NOTE — Assessment & Plan Note (Addendum)
Recently diagnosed by Dr. Michele Mcalpine (neurology) based upon NCV-EMG.    Appropriately S/P CT CAP demonstrating a 9 mm RUL nodule due to recent dx.  9 mm nodule is below the threshold of PET imaging. Therefore, according to radiology recommendations, will repeat CT CAP with contrast 3 months from his last CT scan on 06/21/2015.  Orders are placed and he will be scheduled for CT imaging in Feb 2017.  LEMS is often associated with a paraneoplastic disease. Approximately 50% of LEMS cases are associated with malignancy, mainly SCLC; and the prevalence of LEMS in [place] patients is approximately 3%.  Thus, early recognition of LEMS is important as a possible early indicator of SCLC.  LEMS is not associated with hematologic malignancies.  If lung nodule enlarges with next imaging, PET imaging may be indicated to further evaluate RUL lesion.  Oncology will follow.

## 2015-06-25 NOTE — Progress Notes (Signed)
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 male returns for followup of microcytic anemia in the setting of positive sickle cell screen (daughter with sickle cell anemia and son with sickle cell trait) with normal iron studies and GI evaluation. AND Low-normal B12, on SL replacement.  I personally reviewed and went over laboratory results with the patient.  The results are noted within this dictation.  Noted labs are as follows: negative screening for Multiple myeloma, iron studies are WNL, renal function is WNL, LDH, Haptoglobin, and TSH are WNL, normal retic count, normal folate, and low-normal B12 level.  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  As a result, imaging has been completed as LEMS is often associated with a paraneoplastic disease. Approximately 50% of LEMS cases are associated with malignancy, mainly SCLC; and the prevalence of LEMS in [place] patients is approximately 3%.  Thus, early recognition of LEMS is important as a possible early indicator of SCLC.  I personally reviewed and went over radiographic studies with the patient.  The results are noted within this dictation.  CT CAP demonstrated a single RUL nodule measuring 9 mm is size.  No other findings suspicious for malignancy.  LEMS  is not associated with any hematologic malignancies.  He continues to complain of a poor appetite.  He notes that food tastes "bitter."  He is using Zinc per Dr. Donald Pore recommendation and he notes that it has helped some.  His weight is actually up.  He is up 15 lbs since May 2016.   He denies any blood in his stool, but admits to dark stools.     Past Medical History  Diagnosis Date  . Seizures ([place])     in remote past   . Chronic back pain   . DDD (degenerative disc disease), lumbar   . COPD (chronic obstructive pulmonary disease) ([place])   . Shortness of breath dyspnea   . GERD (gastroesophageal reflux disease)   . Low blood pressure   . Microcytic anemia 06/25/2015  . 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     has No Known Allergies.  Current Outpatient Prescriptions on File Prior to Visit  Medication Sig Dispense Refill  . albuterol (PROVENTIL HFA;VENTOLIN HFA) 108 (90 BASE) MCG/ACT inhaler Inhale 2 puffs into the lungs every 6 (six) hours as needed for wheezing or shortness of breath.    Marland Kitchen aspirin EC 81 MG tablet Take 81 mg by mouth daily.    . fludrocortisone (FLORINEF) 0.1 MG tablet Take 2 tablets (0.2 mg total) by mouth 2 (two) times daily. (Patient taking differently: Take 0.1 mg by mouth 2 (two) times daily.  Take 3 tablets twice daily) 60 tablet 3  . gabapentin (NEURONTIN) 300 MG capsule Take 1cap at bedtime x7days, then 1cap twice daily x7 days, then 1 cap three times daily 90 capsule 0  . midodrine (PROAMATINE) 10 MG tablet Take 1 tablet (10 mg total) by mouth 3 (three) times daily with meals. 90 tablet 3  . pantoprazole (PROTONIX) 40 MG tablet Take 1 tablet (40 mg total) by mouth daily. 90 tablet 3  .  vitamin B-12 (CYANOCOBALAMIN) 1000 MCG tablet Take 1,000 mcg by mouth daily.    . Zinc 50 MG CAPS Take 50 mg by mouth every morning.    . diclofenac (VOLTAREN) 75 MG EC tablet Take 75 mg by mouth 2 (two) times daily as needed.    . tiotropium (SPIRIVA) 18 MCG inhalation capsule Place 18 mcg into inhaler and inhale daily.     No current facility-administered medications on file prior to visit.    Past Surgical History  Procedure Laterality Date  . None    . Colonoscopy N/A 11/12/2014    Dr. Clayburn Pert external and internal hemorrhoids, redundant left colon  . Esophagogastroduodenoscopy N/A 11/12/2014    Dr. Barnie Alderman non-erosive gastritis. negative H.pylori   . Esophageal dilation N/A 11/12/2014    Procedure: ESOPHAGEAL DILATION;  Surgeon: Danie Binder, MD;  Location: [facility];  Service: Endoscopy;  Laterality: N/A;    Denies any headaches, dizziness, double vision, fevers, chills, night sweats, nausea, vomiting, diarrhea, constipation, chest pain, heart palpitations, shortness of breath, blood in stool, black tarry stool, urinary pain, urinary burning, urinary frequency, hematuria.   PHYSICAL EXAMINATION  ECOG PERFORMANCE STATUS: 1 - Symptomatic but completely ambulatory  Filed Vitals:   06/26/15 1040  BP: 118/68  Pulse: 89  Temp: 97.9 F (36.6 C)  Resp: 18    GENERAL:alert, no distress, well nourished, well developed, comfortable, cooperative, smiling and unaccompanied SKIN: skin color, texture, turgor are normal, no rashes or significant lesions HEAD: Normocephalic, No masses, lesions, tenderness or abnormalities EYES: normal, PERRLA, EOMI, Conjunctiva are pink and non-injected EARS: External ears normal OROPHARYNX:lips, buccal mucosa, and tongue normal and mucous membranes are moist  NECK: supple, no adenopathy, trachea midline LYMPH:  no palpable lymphadenopathy BREAST:not examined LUNGS: clear to auscultation  HEART: regular rate & rhythm, no murmurs, no gallops,  S1 normal and S2 normal ABDOMEN:abdomen soft, non-tender and normal bowel sounds BACK: Back symmetric, no curvature. EXTREMITIES:less then 2 second capillary refill, no joint deformities, effusion, or inflammation, no skin discoloration, no cyanosis  NEURO: alert & oriented x 3 with fluent speech, no focal motor/sensory deficits, gait normal    LABORATORY DATA: CBC    Component Value Date/Time   WBC 16.7* 06/26/2015 0923   RBC 5.52 06/26/2015 0923   RBC 5.11 01/04/2015 1630   HGB 12.2* 06/26/2015 0923   HCT 37.5* 06/26/2015 0923   PLT 235 06/26/2015 0923   MCV 67.9* 06/26/2015 0923   MCH 22.1* 06/26/2015 0923   MCHC 32.5 06/26/2015 0923   RDW 15.4 06/26/2015 0923   LYMPHSABS 1.9 06/26/2015 0923   MONOABS 0.9 06/26/2015 0923   EOSABS 0.9* 06/26/2015 0923   BASOSABS 0.0 06/26/2015 0923      Chemistry      Component Value Date/Time   NA 142 06/26/2015 0923   K 4.3 06/26/2015 0923   CL 104 06/26/2015 0923   CO2 29 06/26/2015 0923   BUN 15 06/26/2015 0923   CREATININE 1.21 06/26/2015 0923      Component Value  Date/Time   CALCIUM 9.5 06/26/2015 0923   ALKPHOS 70 06/26/2015 0923   AST 32 06/26/2015 0923   ALT 32 06/26/2015 0923   BILITOT 0.3 06/26/2015 0923        PENDING LABS:   RADIOGRAPHIC STUDIES:  Ct Chest W Contrast  06/21/2015  CLINICAL DATA:  14 pound weight loss. EXAM: CT CHEST, ABDOMEN, AND PELVIS WITH CONTRAST TECHNIQUE: Multidetector CT imaging of the chest, abdomen and pelvis was performed following the standard protocol during bolus administration of intravenous contrast. CONTRAST:  129mL OMNIPAQUE IOHEXOL 300 MG/ML  SOLN COMPARISON:  None. 07/27/2014 FINDINGS: CT CHEST FINDINGS Mediastinum/Nodes: Normal heart size. There is no pericardial effusion. The trachea appears patent and is midline. Normal appearance of the esophagus. No mediastinal or hilar adenopathy identified. Aortic atherosclerosis identified. Lungs/Pleura: No pleural fluid identified.  Moderate changes of centrilobular emphysema. There is a sub solid nodule identified within the right upper lobe measuring 9 mm, image 24 of series 3. Musculoskeletal: There is no aggressive lytic or sclerotic bone lesion identified. CT ABDOMEN PELVIS FINDINGS Hepatobiliary: No focal liver abnormality. The gallbladder appears normal. No biliary dilatation. Pancreas: Negative Spleen: The spleen is negative. Adrenals/Urinary Tract: Normal adrenal glands. Normal appearance of the kidneys. The urinary bladder appears within normal limits. Stomach/Bowel: The stomach is normal. The small bowel loops have a normal course and caliber. No obstruction. The appendix is visualized and appears normal. There is a moderate to marked stool burden identified throughout the colon. No obstructing mass identified. Vascular/Lymphatic: Calcified atherosclerotic disease involves the abdominal aorta. No aneurysm. No enlarged retroperitoneal or mesenteric adenopathy. No enlarged pelvic or inguinal lymph nodes. Reproductive: Prostate gland and seminal vesicles are unremarkable. Other: There is no ascites or focal fluid collections within the abdomen or pelvis. Musculoskeletal: No aggressive lytic or sclerotic bone lesions. Degenerative disc disease is noted at the L4-5 level. IMPRESSION: 1. There is a single sub solid nodule identified within the right upper lobe which measures 9 mm. Initial follow-up by chest CT without contrast is recommended in 3 months to confirm persistence. This recommendation follows the consensus statement: Recommendations for the Management of Subsolid Pulmonary Nodules Detected at CT: A Statement from the [place] as published in Radiology 2013; 266:304-317. 2. Aortic atherosclerosis. 3. Lumbar spondylosis. 4. Emphysema. Electronically Signed   By: Kerby Moors M.D.   On: 06/21/2015 08:22   Ct Abdomen Pelvis W Contrast  06/21/2015  CLINICAL DATA:  14 pound weight loss. EXAM: CT CHEST, ABDOMEN, AND  PELVIS WITH CONTRAST TECHNIQUE: Multidetector CT imaging of the chest, abdomen and pelvis was performed following the standard protocol during bolus administration of intravenous contrast. CONTRAST:  1108mL OMNIPAQUE IOHEXOL 300 MG/ML  SOLN COMPARISON:  None. 07/27/2014 FINDINGS: CT CHEST FINDINGS Mediastinum/Nodes: Normal heart size. There is no pericardial effusion. The trachea appears patent and is midline. Normal appearance of the esophagus. No mediastinal or hilar adenopathy identified. Aortic atherosclerosis identified. Lungs/Pleura: No pleural fluid identified. Moderate changes of centrilobular emphysema. There is a sub solid nodule identified within the right upper lobe measuring 9 mm, image 24 of series 3. Musculoskeletal: There is no aggressive lytic or sclerotic bone lesion identified. CT ABDOMEN PELVIS FINDINGS Hepatobiliary: No focal liver abnormality. The gallbladder appears normal. No biliary dilatation. Pancreas: Negative Spleen: The spleen is negative. Adrenals/Urinary Tract: Normal adrenal glands. Normal appearance of the kidneys. The urinary bladder appears within normal limits. Stomach/Bowel: The stomach is normal. The small bowel loops have a normal course and caliber. No obstruction. The appendix  is visualized and appears normal. There is a moderate to marked stool burden identified throughout the colon. No obstructing mass identified. Vascular/Lymphatic: Calcified atherosclerotic disease involves the abdominal aorta. No aneurysm. No enlarged retroperitoneal or mesenteric adenopathy. No enlarged pelvic or inguinal lymph nodes. Reproductive: Prostate gland and seminal vesicles are unremarkable. Other: There is no ascites or focal fluid collections within the abdomen or pelvis. Musculoskeletal: No aggressive lytic or sclerotic bone lesions. Degenerative disc disease is noted at the L4-5 level. IMPRESSION: 1. There is a single sub solid nodule identified within the right upper lobe which measures 9  mm. Initial follow-up by chest CT without contrast is recommended in 3 months to confirm persistence. This recommendation follows the consensus statement: Recommendations for the Management of Subsolid Pulmonary Nodules Detected at CT: A Statement from the [place] as published in Radiology 2013; 266:304-317. 2. Aortic atherosclerosis. 3. Lumbar spondylosis. 4. Emphysema. Electronically Signed   By: Kerby Moors M.D.   On: 06/21/2015 08:22     PATHOLOGY:    ASSESSMENT AND PLAN:  Microcytic anemia Microcytic anemia in the setting of positive sickle cell screen (daughter with sickle cell anemia and son with sickle cell trait) with normal iron studies and GI evaluation.  Very rarely do individuals with sickle cell trait demonstrate symptoms of sickle cell disease such as anemia or acute chest syndrome.  As a result, we cannot explain the patient's mild microcytic anemia on sickle cell trait.   Labs today: CBC diff, CMET  Will add a pathologist smear review to the patient's lab work today.  Due to leukocytosis without any new medications or infectious symptoms/findings, repeat CBC diff in 4 weeks with a peripheral flow cytometry.  With persistent anemia and patient noting black stools, will send patient home today with stool cards x 3 evaluate for occult GI blood loss.  Labs in 4 months: CBC diff, CMET.  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 Michele Mcalpine (neurology) based upon NCV-EMG.    Appropriately S/P CT CAP demonstrating a 9 mm RUL nodule due to recent dx.  9 mm nodule is below the threshold of PET imaging. Therefore, according to radiology recommendations, will repeat CT CAP with contrast 3 months from his last CT scan on 06/21/2015.  Orders are placed and he will be scheduled for CT imaging in Feb 2017.  LEMS is often associated with a paraneoplastic disease. Approximately 50% of LEMS  cases are associated with malignancy, mainly SCLC; and the prevalence of LEMS in [place] patients is approximately 3%.  Thus, early recognition of LEMS is important as a possible early indicator of SCLC.  LEMS is not associated with hematologic malignancies.  If lung nodule enlarges with next imaging, PET imaging may be indicated to further evaluate RUL lesion.  Oncology will follow.  B12 deficiency On SL B12 replacement.  Labs in 4 months: B12    THERAPY PLAN:  Plan outlined above.  All questions were answered. The patient knows to call the clinic with any problems, questions or concerns. We can certainly see the patient much sooner if necessary.  Patient and plan discussed with Dr. Ancil Linsey and she is in agreement with the aforementioned.   This note is electronically signed by: Robynn Pane, PA-C 06/26/2015 11:22 AM

## 2015-06-25 NOTE — Assessment & Plan Note (Signed)
On SL B12 replacement.  Labs in 4 months: B12

## 2015-06-26 ENCOUNTER — Encounter (HOSPITAL_COMMUNITY): Payer: Medicare Other | Attending: Hematology & Oncology

## 2015-06-26 ENCOUNTER — Encounter (HOSPITAL_BASED_OUTPATIENT_CLINIC_OR_DEPARTMENT_OTHER): Payer: Medicare Other | Admitting: Oncology

## 2015-06-26 ENCOUNTER — Ambulatory Visit (HOSPITAL_COMMUNITY): Payer: Medicare Other | Admitting: Hematology & Oncology

## 2015-06-26 ENCOUNTER — Encounter (HOSPITAL_COMMUNITY): Payer: Self-pay | Admitting: Oncology

## 2015-06-26 VITALS — BP 118/68 | HR 89 | Temp 97.9°F | Resp 18 | Wt 180.2 lb

## 2015-06-26 DIAGNOSIS — R911 Solitary pulmonary nodule: Secondary | ICD-10-CM

## 2015-06-26 DIAGNOSIS — D509 Iron deficiency anemia, unspecified: Secondary | ICD-10-CM | POA: Diagnosis not present

## 2015-06-26 DIAGNOSIS — R634 Abnormal weight loss: Secondary | ICD-10-CM | POA: Diagnosis not present

## 2015-06-26 DIAGNOSIS — G7080 Lambert-Eaton syndrome, unspecified: Secondary | ICD-10-CM | POA: Diagnosis not present

## 2015-06-26 DIAGNOSIS — E538 Deficiency of other specified B group vitamins: Secondary | ICD-10-CM | POA: Diagnosis not present

## 2015-06-26 DIAGNOSIS — D649 Anemia, unspecified: Secondary | ICD-10-CM | POA: Insufficient documentation

## 2015-06-26 DIAGNOSIS — D573 Sickle-cell trait: Secondary | ICD-10-CM | POA: Diagnosis not present

## 2015-06-26 DIAGNOSIS — D72829 Elevated white blood cell count, unspecified: Secondary | ICD-10-CM | POA: Diagnosis not present

## 2015-06-26 LAB — CBC WITH DIFFERENTIAL/PLATELET
Basophils Absolute: 0 10*3/uL (ref 0.0–0.1)
Basophils Relative: 0 %
EOS ABS: 0.9 10*3/uL — AB (ref 0.0–0.7)
EOS PCT: 5 %
HCT: 37.5 % — ABNORMAL LOW (ref 39.0–52.0)
Hemoglobin: 12.2 g/dL — ABNORMAL LOW (ref 13.0–17.0)
LYMPHS ABS: 1.9 10*3/uL (ref 0.7–4.0)
Lymphocytes Relative: 11 %
MCH: 22.1 pg — AB (ref 26.0–34.0)
MCHC: 32.5 g/dL (ref 30.0–36.0)
MCV: 67.9 fL — ABNORMAL LOW (ref 78.0–100.0)
Monocytes Absolute: 0.9 10*3/uL (ref 0.1–1.0)
Monocytes Relative: 6 %
Neutro Abs: 13 10*3/uL — ABNORMAL HIGH (ref 1.7–7.7)
Neutrophils Relative %: 78 %
PLATELETS: 235 10*3/uL (ref 150–400)
RBC: 5.52 MIL/uL (ref 4.22–5.81)
RDW: 15.4 % (ref 11.5–15.5)
WBC: 16.7 10*3/uL — AB (ref 4.0–10.5)

## 2015-06-26 LAB — COMPREHENSIVE METABOLIC PANEL
ALT: 32 U/L (ref 17–63)
AST: 32 U/L (ref 15–41)
Albumin: 3.7 g/dL (ref 3.5–5.0)
Alkaline Phosphatase: 70 U/L (ref 38–126)
Anion gap: 9 (ref 5–15)
BUN: 15 mg/dL (ref 6–20)
CHLORIDE: 104 mmol/L (ref 101–111)
CO2: 29 mmol/L (ref 22–32)
CREATININE: 1.21 mg/dL (ref 0.61–1.24)
Calcium: 9.5 mg/dL (ref 8.9–10.3)
GFR calc Af Amer: >60 mL/min (ref >60)
GFR calc non Af Amer: 59 mL/min — ABNORMAL LOW (ref >60)
GLUCOSE: 117 mg/dL — AB (ref 65–99)
POTASSIUM: 4.3 mmol/L (ref 3.5–5.1)
SODIUM: 142 mmol/L (ref 135–145)
Total Bilirubin: 0.3 mg/dL (ref 0.3–1.2)
Total Protein: 7.2 g/dL (ref 6.5–8.1)

## 2015-06-26 NOTE — Patient Instructions (Signed)
[place] at [facility] Discharge Instructions  RECOMMENDATIONS MADE BY THE CONSULTANT AND ANY TEST RESULTS WILL BE SENT TO YOUR REFERRING PHYSICIAN.  Labs in 4 months  CT scans in 3 months Stool cards given today   Thank you for choosing [place] at [facility] to provide your oncology and hematology care.  To afford each patient quality time with our provider, please arrive at least 15 minutes before your scheduled appointment time.    You need to re-schedule your appointment should you arrive 10 or more minutes late.  We strive to give you quality time with our providers, and arriving late affects you and other patients whose appointments are after yours.  Also, if you no show three or more times for appointments you may be dismissed from the clinic at the providers discretion.     Again, thank you for choosing [facility].  Our hope is that these requests will decrease the amount of time that you wait before being seen by our physicians.       _____________________________________________________________  Should you have questions after your visit to [facility], please contact our office at [phone] between the hours of 8:30 a.m. and 4:30 p.m.  Voicemails left after 4:30 p.m. will not be returned until the following business day.  For prescription refill requests, have your pharmacy contact our office.   Stool for Occult Blood Test WHY AM I HAVING THIS TEST? Stool for occult blood, or fecal occult blood test (FOBT), is a test that is used to screen for gastrointestinal (GI) bleeding, which may be an indicator of colon cancer. This test can also detect small amounts of blood in your stool (feces) from other causes, such as ulcers or hemorrhoids. This test is usually done as part of an annual routine examination after age 70. WHAT KIND OF SAMPLE IS TAKEN? A sample of your stool is required for this test. Your  health care provider may collect the sample with a swab of the rectum. Or, you may be instructed to collect the sample in a container at home. If you are instructed to collect the sample, your health care provider will provide you with the instructions and the supplies that you will need to do that. WILL I NEED TO COLLECT SAMPLES AT HOME?  A stool sample may need to be collected at home. When collecting a sample at home, make sure that you:  Use the sterile containers and other supplies that were given to you from the lab.  Do not mix urine, toilet paper, or water with your sample.  Label all slides and containers with your name and the date when you collected the sample. Your health care provider or lab staff will give you one or more test "cards." You will collect a separate sample from three different stools, usually on different days that follow each other. Follow these steps for each sample: 1. Collect a stool sample into a clean container. 2. With an applicator stick, apply a thin smear of stool sample onto each filter paper square or window that is on the card. 3. Allow the filter paper to dry. After it is dry, the sample will be stable. Usually, you will return all of the samples to your health care provider or lab at the same time.  HOW DO I PREPARE FOR THE TEST?  Do not eat any red meat within three days before testing.  Follow  your health care provider's instructions about eating and drinking prior to the test. Your health care provider may instruct you to avoid other foods or substances.  Ask your health care provider about taking or not taking your medicines prior to the test. You may be instructed to avoid certain medicines that are known to interfere with this test. HOW ARE THE TEST RESULTS REPORTED? Your test results will be reported as either positive or negative. It is your responsibility to obtain your test results. Ask the lab or department performing the test when and how  you will get your results. WHAT DO THE RESULTS MEAN? A negative test result means that there is no occult blood within the stool. A negative result is normal. A positive test result may mean that there is blood in the stool. Causes of blood in the stool include:  GI tumors.  Certain GI diseases.  GI trauma or recent surgery.  Hemorrhoids. Talk with your health care provider to discuss your results, treatment options, and if necessary, the need for more tests. Talk with your health care provider if you have any questions about your results.   This information is not intended to replace advice given to you by your health care provider. Make sure you discuss any questions you have with your health care provider.   Document Released: 08/21/2004 Document Revised: 08/17/2014 Document Reviewed: 12/22/2013 Elsevier Interactive Patient Education [employer].

## 2015-06-27 ENCOUNTER — Ambulatory Visit (HOSPITAL_COMMUNITY): Payer: Medicare Other | Admitting: Physical Therapy

## 2015-06-27 DIAGNOSIS — M2560 Stiffness of unspecified joint, not elsewhere classified: Secondary | ICD-10-CM

## 2015-06-27 DIAGNOSIS — M5440 Lumbago with sciatica, unspecified side: Secondary | ICD-10-CM

## 2015-06-27 DIAGNOSIS — M2569 Stiffness of other specified joint, not elsewhere classified: Secondary | ICD-10-CM

## 2015-06-27 DIAGNOSIS — M25659 Stiffness of unspecified hip, not elsewhere classified: Secondary | ICD-10-CM | POA: Diagnosis not present

## 2015-06-27 DIAGNOSIS — M6281 Muscle weakness (generalized): Secondary | ICD-10-CM | POA: Diagnosis not present

## 2015-06-27 DIAGNOSIS — R293 Abnormal posture: Secondary | ICD-10-CM | POA: Diagnosis not present

## 2015-06-27 NOTE — Therapy (Signed)
[place], [place], [address] Phone: [phone]   Fax:  [phone]  Physical Therapy Treatment  Patient Details  Name: Erik [name] MRN: [id number] Date of Birth: [birth year] Referring Provider: [name], DO   Encounter Date: 06/27/2015      PT End of Session - 06/27/15 1606    Visit Number 7   Number of Visits 12   Date for PT Re-Evaluation 07/03/15   Authorization Type Medicare/Medicaid    Authorization Time Period 06/05/15 to 08/05/15   Authorization - Visit Number 7   Authorization - Number of Visits 10   PT Start Time 6073   PT Stop Time 1557   PT Time Calculation (min) 41 min   Activity Tolerance Patient tolerated treatment well   Behavior During Therapy [facility] for tasks assessed/performed      Past Medical History  Diagnosis Date  . Seizures ([place])     in remote past   . Chronic back pain   . DDD (degenerative disc disease), lumbar   . COPD (chronic obstructive pulmonary disease) ([place])   . Shortness of breath dyspnea   . GERD (gastroesophageal reflux disease)   . Low blood pressure   . Microcytic anemia 06/25/2015  . 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 B12 deficiency 06/25/2015    Past Surgical History  Procedure Laterality Date  . None    . Colonoscopy N/A 11/12/2014    Dr. Clayburn Pert external and internal hemorrhoids, redundant left colon  . Esophagogastroduodenoscopy N/A 11/12/2014    Dr. Barnie Alderman non-erosive gastritis. negative H.pylori   . Esophageal dilation N/A 11/12/2014    Procedure: ESOPHAGEAL DILATION;  Surgeon: Danie Binder, MD;  [facility];  Service: Endoscopy;  Laterality: N/A;    There were no vitals filed for this visit.  Visit Diagnosis:  Midline low back pain with sciatica, sciatica laterality unspecified  Muscle weakness  Back stiffness  Hip stiffness, unspecified laterality  Poor posture      Subjective  Assessment - 06/27/15 1518    Subjective Patient reports he is just diong alright today; pain is still in the middle of his back    Pertinent History Patient reports that his back has been hurting for awhile; really started getting worse last October. Can't remember doing anything that made his back worse, just started out of nowhere. States that awhile ago MD said not to pick up anything over 10 pounds. No hip problems. Pain does run down his legs on both sides, front and back.    Currently in Pain? Yes   Pain Score 5                          [facility] Adult PT Treatment/Exercise - 06/27/15 0001    Lumbar Exercises: Stretches   Active Hamstring Stretch 3 reps;30 seconds   Active Hamstring Stretch Limitations 12 inch box    Passive Hamstring Stretch 3 reps;30 seconds   Passive Hamstring Stretch Limitations gastroc on slantboard    Single Knee to Chest Stretch 5 reps;10 seconds   Lower Trunk Rotation 5 reps;10 seconds   Piriformis Stretch 3 reps;30 seconds   Piriformis Stretch Limitations seated   Lumbar Exercises: Standing   Other Standing Lumbar Exercises 3D hip excursions 1x15   Other Standing Lumbar Exercises hip ABD walks 2x68f for hip ABD endurance    Lumbar Exercises: Supine   Ab Set 20 reps  AB Set Limitations 3 second holds    Bridge 20 reps   Other Supine Lumbar Exercises pelvic clocks 1x10 each way    Other Supine Lumbar Exercises dead bugs 1x10   Lumbar Exercises: Sidelying   Hip Abduction 15 reps   Lumbar Exercises: Prone   Straight Leg Raise --                PT Education - 06/27/15 1606    Education provided No          PT Short Term Goals - 06/05/15 1638    PT SHORT TERM GOAL #1   Title Patient will be able to verbalize the importance of good posture, and will be able to maintain improved posture in sitting and standing with cues no more than 20% of the time    Time 3   Period Weeks   Status New   PT SHORT TERM GOAL #2   Title  Patient will improve bilateral hip internal rotation to at least 42 degrees to improve functional mechanics and mobility    Time 3   Period Weeks   Status New   PT SHORT TERM GOAL #3   Title Patient will improve lumbar rotation to being only 30% limited bilaterally in order to improve functional mechanics and reduce pain    Time 3   Period Weeks   Status New   PT SHORT TERM GOAL #4   Title patient will be independent in correctly and consistently performing HEP, to be updated PRN    Time 3   Period Weeks   Status New           PT Long Term Goals - 06/05/15 1642    PT LONG TERM GOAL #1   Title Patient will demonstrate 5/5 muscle srength in all muscles tested in order to reduce pain and improve functional mechanics    Time 6   Period Weeks   Status New   PT LONG TERM GOAL #2   Title Patient will report that his pain is 0/10 at least 80% of the time with 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    Time 6   Period Weeks   Status New   PT LONG TERM GOAL #3   Title patient will demonstrate reduced rigidity in lumbar and sacral PAs in order to evidence improved spinal mobiltiy and contribute to improved function and overall reduced pain    Time 8   Period Weeks   Status New   PT LONG TERM GOAL #4   Title Patient will perform at least 30 minutes of exercise of light to moderate intensity at least 3 days per week in order to maintain functional gains and promote overall improved health status    Time 6   Period Weeks   Status New               Plan - 06/27/15 1607    Clinical Impression Statement Continued functional strengthing in supine and standing today; patient continues to be limited by fatigue during treatment sessions. Continues to have mid-line back pain today, and continues to demonstrate functional weakness as well. No change in pain at end of session.    Pt will benefit from skilled therapeutic intervention in order to  improve on the following deficits Abnormal gait;Hypomobility;Decreased activity tolerance;Decreased strength;Pain;Decreased balance;Decreased mobility;Difficulty walking;Improper body mechanics;Decreased coordination;Impaired flexibility;Postural dysfunction   Rehab Potential Excellent   PT Frequency 2x / week  PT Duration 6 weeks   PT Treatment/Interventions ADLs/Self Care Home Management;Gait training;Stair training;Functional mobility training;Therapeutic activities;Therapeutic exercise;Balance training;Neuromuscular re-education;Patient/family education;Manual techniques   PT Next Visit Plan Continue to progress functional strength and improve form with therex. Add lunges next session.   PT Home Exercise Plan given    Consulted and Agree with Plan of Care Patient        Problem List Patient Active Problem List   Diagnosis Date Noted  . Microcytic anemia 06/25/2015  . 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 B12 deficiency 06/25/2015  . Loss of weight 10/26/2014  . Dysphagia, pharyngoesophageal phase 10/26/2014  . Low back pain 07/10/2014  . Orthostatic hypotension 07/07/2014  . Orthostasis 07/07/2014    Deniece Ree PT, DPT [place] [address] [place], [place], [address] Phone: [phone]   Fax:  [phone]  Name: Erik [name] MRN: [id number] Date of Birth: 1945/10/02

## 2015-06-27 NOTE — Progress Notes (Signed)
LABS DRAWN

## 2015-07-01 ENCOUNTER — Ambulatory Visit (HOSPITAL_COMMUNITY): Payer: Medicare Other | Admitting: Physical Therapy

## 2015-07-01 DIAGNOSIS — M25659 Stiffness of unspecified hip, not elsewhere classified: Secondary | ICD-10-CM

## 2015-07-01 DIAGNOSIS — M6281 Muscle weakness (generalized): Secondary | ICD-10-CM | POA: Diagnosis not present

## 2015-07-01 DIAGNOSIS — M5440 Lumbago with sciatica, unspecified side: Secondary | ICD-10-CM

## 2015-07-01 DIAGNOSIS — M2560 Stiffness of unspecified joint, not elsewhere classified: Secondary | ICD-10-CM

## 2015-07-01 DIAGNOSIS — R293 Abnormal posture: Secondary | ICD-10-CM | POA: Diagnosis not present

## 2015-07-01 DIAGNOSIS — M2569 Stiffness of other specified joint, not elsewhere classified: Secondary | ICD-10-CM

## 2015-07-01 NOTE — Therapy (Signed)
[place], [place], [address] Phone: [phone]   Fax:  [phone]  Physical Therapy Treatment  Patient Details  Name: Erik [name] MRN: [id number] Date of Birth: 04/08/1946 Referring Provider: [name], DO   Encounter Date: 07/01/2015      PT End of Session - 07/01/15 1555    Visit Number 8   Number of Visits 12   Date for PT Re-Evaluation 07/03/15   Authorization Type Medicare/Medicaid    Authorization Time Period 06/05/15 to 08/05/15   Authorization - Visit Number 8   Authorization - Number of Visits 10   PT Start Time 1520   PT Stop Time 1555   PT Time Calculation (min) 35 min   Activity Tolerance Patient tolerated treatment well   Behavior During Therapy [facility] for tasks assessed/performed      Past Medical History  Diagnosis Date  . Seizures ([place])     in remote past   . Chronic back pain   . DDD (degenerative disc disease), lumbar   . COPD (chronic obstructive pulmonary disease) ([place])   . Shortness of breath dyspnea   . GERD (gastroesophageal reflux disease)   . Low blood pressure   . Microcytic anemia 06/25/2015  . 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 B12 deficiency 06/25/2015    Past Surgical History  Procedure Laterality Date  . None    . Colonoscopy N/A 11/12/2014    Dr. Clayburn Pert external and internal hemorrhoids, redundant left colon  . Esophagogastroduodenoscopy N/A 11/12/2014    Dr. Barnie Alderman non-erosive gastritis. negative H.pylori   . Esophageal dilation N/A 11/12/2014    Procedure: ESOPHAGEAL DILATION;  Surgeon: Danie Binder, MD;  [facility];  Service: Endoscopy;  Laterality: N/A;    There were no vitals filed for this visit.  Visit Diagnosis:  Midline low back pain with sciatica, sciatica laterality unspecified  Muscle weakness  Back stiffness  Hip stiffness, unspecified laterality  Poor posture      Subjective  Assessment - 07/01/15 1522    Subjective PT states he's been doing alot of walking today with his SPC outdoors (no AD indoors).  States he is not having any pain currently.    Currently in Pain? No/denies                         [facility] Adult PT Treatment/Exercise - 07/01/15 1524    Lumbar Exercises: Stretches   Active Hamstring Stretch 3 reps;30 seconds   Active Hamstring Stretch Limitations 12 inch box    Passive Hamstring Stretch 3 reps;30 seconds   Passive Hamstring Stretch Limitations gastroc on slantboard    Piriformis Stretch 3 reps;30 seconds   Piriformis Stretch Limitations seated   Lumbar Exercises: Standing   Functional Squats 10 reps   Functional Squats Limitations with tactile cues from therapist   Other Standing Lumbar Exercises hip ABD walks 2x53f for hip ABD endurance    Lumbar Exercises: Supine   Ab Set 20 reps   AB Set Limitations 3 second holds    Bridge 20 reps   Straight Leg Raise 10 reps   Lumbar Exercises: Sidelying   Hip Abduction 15 reps                  PT Short Term Goals - 06/05/15 1638    PT SHORT TERM GOAL #1   Title Patient will be able to verbalize the importance  of good posture, and will be able to maintain improved posture in sitting and standing with cues no more than 20% of the time    Time 3   Period Weeks   Status New   PT SHORT TERM GOAL #2   Title Patient will improve bilateral hip internal rotation to at least 42 degrees to improve functional mechanics and mobility    Time 3   Period Weeks   Status New   PT SHORT TERM GOAL #3   Title Patient will improve lumbar rotation to being only 30% limited bilaterally in order to improve functional mechanics and reduce pain    Time 3   Period Weeks   Status New   PT SHORT TERM GOAL #4   Title patient will be independent in correctly and consistently performing HEP, to be updated PRN    Time 3   Period Weeks   Status New           PT Long Term Goals -  06/05/15 1642    PT LONG TERM GOAL #1   Title Patient will demonstrate 5/5 muscle srength in all muscles tested in order to reduce pain and improve functional mechanics    Time 6   Period Weeks   Status New   PT LONG TERM GOAL #2   Title Patient will report that his pain is 0/10 at least 80% of the time with 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    Time 6   Period Weeks   Status New   PT LONG TERM GOAL #3   Title patient will demonstrate reduced rigidity in lumbar and sacral PAs in order to evidence improved spinal mobiltiy and contribute to improved function and overall reduced pain    Time 8   Period Weeks   Status New   PT LONG TERM GOAL #4   Title Patient will perform at least 30 minutes of exercise of light to moderate intensity at least 3 days per week in order to maintain functional gains and promote overall improved health status    Time 6   Period Weeks   Status New               Plan - 07/01/15 1559    Clinical Impression Statement PT requested to "go easy" as he had been walking alot today.  Completed therex with noted improvement in abiltiy to complete 10 reps of  SLR today without needing a rest break. PT with improved form with squats and able to complete all mat exercises with less verbal cues.  Pt is progressing well wtihout c/o pain today.    Pt will benefit from skilled therapeutic intervention in order to improve on the following deficits Abnormal gait;Hypomobility;Decreased activity tolerance;Decreased strength;Pain;Decreased balance;Decreased mobility;Difficulty walking;Improper body mechanics;Decreased coordination;Impaired flexibility;Postural dysfunction   Rehab Potential Excellent   PT Frequency 2x / week   PT Duration 6 weeks   PT Treatment/Interventions ADLs/Self Care Home Management;Gait training;Stair training;Functional mobility training;Therapeutic activities;Therapeutic exercise;Balance  training;Neuromuscular re-education;Patient/family education;Manual techniques   PT Next Visit Plan Continue to progress functional strength and improve form with therex. Add lunges next session.   PT Home Exercise Plan given    Consulted and Agree with Plan of Care Patient        Problem List Patient Active Problem List   Diagnosis Date Noted  . Microcytic anemia 06/25/2015  . 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 B12 deficiency 06/25/2015  .  Loss of weight 10/26/2014  . Dysphagia, pharyngoesophageal phase 10/26/2014  . Low back pain 07/10/2014  . Orthostatic hypotension 07/07/2014  . Orthostasis 07/07/2014    Teena Irani, PTA/CLT [phone]  07/01/2015, 4:02 PM  [place] [address] [place], [place], [address] Phone: [phone]   Fax:  [phone]  Name: Erik [name] MRN: [id number] Date of Birth: 1946/07/21

## 2015-07-03 ENCOUNTER — Telehealth (HOSPITAL_COMMUNITY): Payer: Self-pay | Admitting: Physical Therapy

## 2015-07-03 ENCOUNTER — Ambulatory Visit (HOSPITAL_COMMUNITY): Payer: Medicare Other | Admitting: Physical Therapy

## 2015-07-03 NOTE — Telephone Encounter (Signed)
Patient a no-show for today's appointment. Called and reminded patient of time and date of next session.  Deniece Ree PT, DPT [phone]

## 2015-07-08 ENCOUNTER — Ambulatory Visit (HOSPITAL_COMMUNITY): Payer: Medicare Other | Admitting: Physical Therapy

## 2015-07-08 DIAGNOSIS — M25659 Stiffness of unspecified hip, not elsewhere classified: Secondary | ICD-10-CM | POA: Diagnosis not present

## 2015-07-08 DIAGNOSIS — R293 Abnormal posture: Secondary | ICD-10-CM | POA: Diagnosis not present

## 2015-07-08 DIAGNOSIS — M2560 Stiffness of unspecified joint, not elsewhere classified: Secondary | ICD-10-CM

## 2015-07-08 DIAGNOSIS — M6281 Muscle weakness (generalized): Secondary | ICD-10-CM | POA: Diagnosis not present

## 2015-07-08 DIAGNOSIS — M2569 Stiffness of other specified joint, not elsewhere classified: Secondary | ICD-10-CM

## 2015-07-08 DIAGNOSIS — M5440 Lumbago with sciatica, unspecified side: Secondary | ICD-10-CM

## 2015-07-08 NOTE — Therapy (Signed)
[place] [address] [place], [place], [address] Phone: [phone]   Fax:  [phone]  Physical Therapy Treatment (Re-Assessment)  Patient Details  Name: Erik [name] MRN: [id number] Date of Birth: 25-Jan-1946 Referring Provider: [name], DO   Encounter Date: 07/08/2015      PT End of Session - 07/08/15 1553    Visit Number 9   Number of Visits 10   Authorization Type Medicare/Medicaid    Authorization Time Period 06/05/15 to 08/05/15   Authorization - Visit Number 9   Authorization - Number of Visits 10   PT Start Time 0109   PT Stop Time 1549   PT Time Calculation (min) 43 min   Activity Tolerance Patient tolerated treatment well   Behavior During Therapy [facility] for tasks assessed/performed      Past Medical History  Diagnosis Date  . Seizures ([place])     in remote past   . Chronic back pain   . DDD (degenerative disc disease), lumbar   . COPD (chronic obstructive pulmonary disease) ([place])   . Shortness of breath dyspnea   . GERD (gastroesophageal reflux disease)   . Low blood pressure   . Microcytic anemia 06/25/2015  . 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 B12 deficiency 06/25/2015    Past Surgical History  Procedure Laterality Date  . None    . Colonoscopy N/A 11/12/2014    Dr. Clayburn Pert external and internal hemorrhoids, redundant left colon  . Esophagogastroduodenoscopy N/A 11/12/2014    Dr. Barnie Alderman non-erosive gastritis. negative H.pylori   . Esophageal dilation N/A 11/12/2014    Procedure: ESOPHAGEAL DILATION;  Surgeon: Danie Binder, MD;  [facility];  Service: Endoscopy;  Laterality: N/A;    There were no vitals filed for this visit.  Visit Diagnosis:  Midline low back pain with sciatica, sciatica laterality unspecified  Muscle weakness  Back stiffness  Hip stiffness, unspecified laterality  Poor posture      Subjective Assessment - 07/08/15 1510     Subjective Patient reports that he is doing well, not really having any pain, just sore    Pertinent History Patient reports that his back has been hurting for awhile; really started getting worse last October. Can't remember doing anything that made his back worse, just started out of nowhere. States that awhile ago MD said not to pick up anything over 10 pounds. No hip problems. Pain does run down his legs on both sides, front and back.    How long can you sit comfortably? 11/28- no limits, just sore    How long can you stand comfortably? 11/28- no limits, just sore    How long can you walk comfortably? 11/28- 5 to 10 minutes    Currently in Pain? No/denies            [facility] PT Assessment - 07/08/15 0001    Observation/Other Assessments   Observations positive symptoms with sciatic flossing and slump test    Focus on Therapeutic Outcomes (FOTO)  56% limited    AROM   Right Hip External Rotation  --  wfl but functional stiffness    Right Hip Internal Rotation  31   Left Hip External Rotation  --  wfl but functional stiffness    Left Hip Internal Rotation  35   Lumbar Flexion 87   Lumbar Extension 22   Lumbar - Right Side Bend fingertips to knee    Lumbar - Left Side  Bend fingertips to knee    Lumbar - Right Rotation approx 40% limited    Lumbar - Left Rotation approx 50% limited    Strength   Right Hip Flexion 5/5   Right Hip Extension 4-/5   Right Hip ABduction 4-/5   Left Hip Flexion 5/5   Left Hip Extension 3+/5   Left Hip ABduction 3/5   Right Knee Flexion 4+/5   Right Knee Extension 4+/5   Left Knee Flexion 4+/5   Left Knee Extension 4+/5   Right Ankle Dorsiflexion 5/5   Left Ankle Dorsiflexion 5/5                     [facility] Adult PT Treatment/Exercise - 07/08/15 0001    Lumbar Exercises: Stretches   Active Hamstring Stretch 3 reps;30 seconds   Active Hamstring Stretch Limitations 12 inch box    Passive Hamstring Stretch 3 reps;30 seconds    Passive Hamstring Stretch Limitations gastroc on slantboard    Single Knee to Chest Stretch 5 reps;10 seconds   Lower Trunk Rotation 5 reps;10 seconds   Piriformis Stretch 3 reps;30 seconds   Piriformis Stretch Limitations seated                PT Education - 07/08/15 1552    Education provided Yes   Education Details prognosis and overall plan of care moving forward    Person(s) Educated Patient   Methods Explanation   Comprehension Verbalized understanding          PT Short Term Goals - 07/08/15 1538    PT SHORT TERM GOAL #1   Title Patient will be able to verbalize the importance of good posture, and will be able to maintain improved posture in sitting and standing with cues no more than 20% of the time    Time 3   Period Weeks   Status On-going   PT SHORT TERM GOAL #2   Title Patient will improve bilateral hip internal rotation to at least 42 degrees to improve functional mechanics and mobility    Time 3   Period Weeks   Status On-going   PT SHORT TERM GOAL #3   Title Patient will improve lumbar rotation to being only 30% limited bilaterally in order to improve functional mechanics and reduce pain    Time 3   Period Weeks   Status On-going   PT SHORT TERM GOAL #4   Title patient will be independent in correctly and consistently performing HEP, to be updated PRN    Time 3   Period Weeks   Status Achieved           PT Long Term Goals - 07/08/15 1539    PT LONG TERM GOAL #1   Title Patient will demonstrate 5/5 muscle srength in all muscles tested in order to reduce pain and improve functional mechanics    Time 6   Period Weeks   Status On-going   PT LONG TERM GOAL #2   Title Patient will report that his pain is 0/10 at least 80% of the time with 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    Period Weeks   Status On-going   PT LONG TERM GOAL #3   Title patient will demonstrate reduced rigidity in lumbar and  sacral PAs in order to evidence improved spinal mobiltiy and contribute to improved function and overall reduced pain    Time 8   Period  Weeks   Status On-going   PT LONG TERM GOAL #4   Title Patient will perform at least 30 minutes of exercise of light to moderate intensity at least 3 days per week in order to maintain functional gains and promote overall improved health status    Time 6   Period Weeks   Status On-going               Plan - August 01, 2015 1553    Clinical Impression Statement Re-assessment performed today. Patient does not show significant improvements in range of motion or strength at this time and currently reports that he does not see any significant changes with skilled PT services, limited motivation to continue forwards with PT and reports that he would like to speak to MD before continuing much longer. Did note positive symptoms with sciatic flossing, slump test today as well as pain in majority of back with postural exercises today- suspect that at least a portion of patient's pain is due to postural deficits and neural tension. At this time recommend one more session to focus on advanced HEP and activities/exercises for posture and to improve neural mobility.    Pt will benefit from skilled therapeutic intervention in order to improve on the following deficits Abnormal gait;Hypomobility;Decreased activity tolerance;Decreased strength;Pain;Decreased balance;Decreased mobility;Difficulty walking;Improper body mechanics;Decreased coordination;Impaired flexibility;Postural dysfunction   Rehab Potential Excellent   PT Frequency Other (comment)  1 more visit    PT Duration Other (comment)  1 more visit    PT Treatment/Interventions ADLs/Self Care Home Management;Gait training;Stair training;Functional mobility training;Therapeutic activities;Therapeutic exercise;Balance training;Neuromuscular re-education;Patient/family education;Manual techniques   PT Next Visit Plan  Advanced HEP, postural and neural moblity drills, DC    PT Home Exercise Plan given    Consulted and Agree with Plan of Care Patient          G-Codes - 08-01-15 1557    Functional Assessment Tool Used FOTO 56% limited    Functional Limitation Mobility: Walking and moving around   Mobility: Walking and Moving Around Current Status (Y6599) At least 40 percent but less than 60 percent impaired, limited or restricted   Mobility: Walking and Moving Around Goal Status (J5701) At least 40 percent but less than 60 percent impaired, limited or restricted      Problem List Patient Active Problem List   Diagnosis Date Noted  . Microcytic anemia 06/25/2015  . 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 B12 deficiency 06/25/2015  . Loss of weight 10/26/2014  . Dysphagia, pharyngoesophageal phase 10/26/2014  . Low back pain 07/10/2014  . Orthostatic hypotension 07-31-2014  . Orthostasis 31-Jul-2014    Physical Therapy Progress Note  Dates of Reporting Period: 06/05/15 to 08/01/15  Objective Reports of Subjective Statement: see above   Objective Measurements: see above   Goal Update: see above   Plan: see above   Reason Skilled Services are Required: poor posture, reduced neural mobility, reduced functional strength, difficulty walking, back pain,  advanced HEP    Deniece Ree PT, DPT [place], [place], [address] Phone: [phone]   Fax:  [phone]  Name: EDRIS FRIEDT MRN: [id number] Date of Birth: 1946/05/25

## 2015-07-09 ENCOUNTER — Encounter: Payer: Medicare Other | Admitting: Neurology

## 2015-07-10 ENCOUNTER — Encounter: Payer: Self-pay | Admitting: Gastroenterology

## 2015-07-10 ENCOUNTER — Ambulatory Visit (INDEPENDENT_AMBULATORY_CARE_PROVIDER_SITE_OTHER): Payer: Medicare Other | Admitting: Gastroenterology

## 2015-07-10 VITALS — BP 114/66 | HR 92 | Temp 98.2°F | Ht 72.0 in | Wt 177.2 lb

## 2015-07-10 DIAGNOSIS — D509 Iron deficiency anemia, unspecified: Secondary | ICD-10-CM | POA: Diagnosis not present

## 2015-07-10 NOTE — Progress Notes (Signed)
Referring Provider: [name], MD Primary Care Physician:  [name], MD Primary GI: Dr. Oneida Alar   Chief Complaint  Patient presents with  . Weight Loss    HPI:   Erik [name] is a 69 y.o. male presenting today with a history of weight loss and lack of appetite. EGD and colonoscopy unrevealing. CT without significant findings. Anemia followed by hematology. Back pain noted at last visit in Sept 2016. Referred to neurology. 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 His weight has fluctuated since last visit, but it is up 5 lbs from September.   Has a soreness at top of "butt", then runs down both legs when walking. Has been in therapy. Appetite is still not good. No nausea. Only eats because he knows he has to. No constipation or diarrhea. No hematochezia. Will be doing hemoccults next week. States boost/ensure makes his stool look darker but not black.   Past Medical History  Diagnosis Date  . Seizures ([place])     in remote past   . Chronic back pain   . DDD (degenerative disc disease), lumbar   . COPD (chronic obstructive pulmonary disease) ([place])   . Shortness of breath dyspnea   . GERD (gastroesophageal reflux disease)   . Low blood pressure   . Microcytic anemia 06/25/2015  . LEMS (Lambert-Eaton myasthenic syndrome) ([place]) 06/25/2015  . B12 deficiency 06/25/2015    Past Surgical History  Procedure Laterality Date  . None    . Colonoscopy N/A 11/12/2014    Dr. Clayburn Pert external and internal hemorrhoids, redundant left colon  . Esophagogastroduodenoscopy N/A 11/12/2014    Dr. Barnie Alderman non-erosive gastritis. negative H.pylori   . Esophageal dilation N/A 11/12/2014    Procedure: ESOPHAGEAL DILATION;  Surgeon: Danie Binder, MD;  [facility];  Service: Endoscopy;  Laterality: N/A;    Current Outpatient Prescriptions  Medication Sig Dispense Refill  . albuterol (PROVENTIL HFA;VENTOLIN HFA) 108 (90 BASE) MCG/ACT  inhaler Inhale 2 puffs into the lungs every 6 (six) hours as needed for wheezing or shortness of breath.    Marland Kitchen aspirin EC 81 MG tablet Take 81 mg by mouth daily.    . diclofenac (VOLTAREN) 75 MG EC tablet Take 75 mg by mouth 2 (two) times daily as needed.    . fludrocortisone (FLORINEF) 0.1 MG tablet Take 2 tablets (0.2 mg total) by mouth 2 (two) times daily. (Patient taking differently: Take 0.1 mg by mouth 2 (two) times daily. Take 3 tablets twice daily) 60 tablet 3  . gabapentin (NEURONTIN) 300 MG capsule Take 1cap at bedtime x7days, then 1cap twice daily x7 days, then 1 cap three times daily 90 capsule 0  . midodrine (PROAMATINE) 10 MG tablet Take 1 tablet (10 mg total) by mouth 3 (three) times daily with meals. 90 tablet 3  . pantoprazole (PROTONIX) 40 MG tablet Take 1 tablet (40 mg total) by mouth daily. 90 tablet 3  . tiotropium (SPIRIVA) 18 MCG inhalation capsule Place 18 mcg into inhaler and inhale daily.    . vitamin B-12 (CYANOCOBALAMIN) 1000 MCG tablet Take 1,000 mcg by mouth daily.    . Zinc 50 MG CAPS Take 50 mg by mouth every morning.     No current facility-administered medications for this visit.    Allergies as of 07/10/2015  . (No Known Allergies)    Family History  Problem Relation Age of Onset  . Colon cancer Neg Hx   . Stomach cancer Maternal Grandfather   .  Sickle cell anemia Daughter     Social History   Social History  . Marital Status: Legally Separated    Spouse Name: N/A  . Number of Children: N/A  . Years of Education: N/A   Social History Main Topics  . Smoking status: Former Smoker    Quit date: 08/11/1983  . Smokeless tobacco: None  . Alcohol Use: No     Comment: remote past, history of ETOH abuse.   . Drug Use: No  . Sexual Activity: Not Asked   Other Topics Concern  . None   Social History Narrative    Review of Systems: As mentioned in HPI.   Physical Exam: BP 114/66 mmHg  Pulse 92  Temp(Src) 98.2 F (36.8 C) (Oral)  Ht 6'  (1.829 m)  Wt 177 lb 3.2 oz (80.377 kg)  BMI 24.03 kg/m2 General:   Alert and oriented. No distress noted. Pleasant and cooperative.  Head:  Normocephalic and atraumatic. Eyes:  Conjuctiva clear without scleral icterus. Abdomen:  +BS, soft, non-tender and non-distended. No rebound or guarding. No HSM or masses noted. Msk:  Symmetrical without gross deformities. Normal posture. Extremities:  Without edema. Neurologic:  Alert and  oriented x4 Psych:  Alert and cooperative. Normal mood and affect.  Lab Results  Component Value Date   WBC 16.7* 06/26/2015   HGB 12.2* 06/26/2015   HCT 37.5* 06/26/2015   MCV 67.9* 06/26/2015   PLT 235 06/26/2015   Lab Results  Component Value Date   ALT 32 06/26/2015   AST 32 06/26/2015   ALKPHOS 70 06/26/2015   BILITOT 0.3 06/26/2015   Lab Results  Component Value Date   IRON 58 01/04/2015   TIBC 301 01/04/2015   FERRITIN 62 03/27/2015

## 2015-07-10 NOTE — Patient Instructions (Signed)
We will see what the stool cards show. If this is positive, we will probably do a capsule study that takes pictures of your small intestine.  Otherwise, I will see you in 6 months!

## 2015-07-11 ENCOUNTER — Ambulatory Visit (HOSPITAL_COMMUNITY): Payer: Medicare Other | Attending: Neurology | Admitting: Physical Therapy

## 2015-07-11 ENCOUNTER — Encounter: Payer: Self-pay | Admitting: Neurology

## 2015-07-11 DIAGNOSIS — M25659 Stiffness of unspecified hip, not elsewhere classified: Secondary | ICD-10-CM

## 2015-07-11 DIAGNOSIS — M5440 Lumbago with sciatica, unspecified side: Secondary | ICD-10-CM

## 2015-07-11 DIAGNOSIS — M2560 Stiffness of unspecified joint, not elsewhere classified: Secondary | ICD-10-CM | POA: Insufficient documentation

## 2015-07-11 DIAGNOSIS — M6281 Muscle weakness (generalized): Secondary | ICD-10-CM | POA: Diagnosis not present

## 2015-07-11 DIAGNOSIS — R293 Abnormal posture: Secondary | ICD-10-CM

## 2015-07-11 DIAGNOSIS — M2569 Stiffness of other specified joint, not elsewhere classified: Secondary | ICD-10-CM

## 2015-07-11 NOTE — Patient Instructions (Signed)
   PIRIFORMIS AND HIP STRETCH - SEATED  While sitting in a chair, cross your affected leg on top of the other as shown.   Next, gently lean forward until a stretch is felt along the crossed leg. Hold for 30 seconds each repetition.  Repeat twice each leg, twice a day.    SINGLE KNEE TO CHEST STRETCH - [place]  While Lying on your back,  hold your knee and gently pull it up towards your chest.  Hold for 10 seconds each side, for five times each leg. Do this twice a day.    Lumbar Rotations   Lying on your back with your knees bent, slowly drop your legs to one side and hold the stretch. Come back to the middle and switch sides. You should feel the stretch in your back on the opposite side that your legs are leaning. Go each way 5 times with 10 second holds. Repeat twice a day.    BRIDGING  While lying on your back, tighten your lower abdominals, squeeze your buttocks and then raise your buttocks off the floor/bed as creating a "Bridge" with your body.  Repeat 15 times, twice a day.    HIP ABDUCTION - SIDELYING  While lying on your side, slowly raise up your top leg to the side. Keep your knee straight and maintain your toes pointed forward the entire time.   The bottom leg can be bent to stabilize your body.  Repeat 10 times each side, twice a day.    SCIATIC NERVE GLIDE - [place]  Start by lying on your back and holding the back of your knee (YOU CAN DO THIS SITTING UP WITH YOUR LEGS STRAIGHT OR SITTING IN A CHAIR TOO). Loop a sheet or dog leash around your toe around the ball of the foot and pull your toes up, then relax- you should feel this in the back of your leg and thigh. Repeat 10 time each leg, twice a day.

## 2015-07-11 NOTE — Therapy (Signed)
[place], [place], [address] Phone: [phone]   Fax:  [phone]  Physical Therapy Treatment (Discharge)  Patient Details  Name: Erik [name] MRN: [id number] Date of Birth: 1946-03-25 Referring Provider: [name], DO   Encounter Date: 07/11/2015      PT End of Session - 07/11/15 1816    Visit Number 10   Number of Visits 10   Authorization Type Medicare/Medicaid    Authorization Time Period 06/05/15 to 08/05/15   Authorization - Visit Number 10   Authorization - Number of Visits 10   PT Start Time 7793   PT Stop Time 1601   PT Time Calculation (min) 45 min   Activity Tolerance Patient tolerated treatment well   Behavior During Therapy [facility] for tasks assessed/performed      Past Medical History  Diagnosis Date  . Seizures ([place])     in remote past   . Chronic back pain   . DDD (degenerative disc disease), lumbar   . COPD (chronic obstructive pulmonary disease) ([place])   . Shortness of breath dyspnea   . GERD (gastroesophageal reflux disease)   . Low blood pressure   . Microcytic anemia 06/25/2015  . LEMS (Lambert-Eaton myasthenic syndrome) ([place]) 06/25/2015  . B12 deficiency 06/25/2015    Past Surgical History  Procedure Laterality Date  . None    . Colonoscopy N/A 11/12/2014    Dr. Clayburn Pert external and internal hemorrhoids, redundant left colon  . Esophagogastroduodenoscopy N/A 11/12/2014    Dr. Barnie Alderman non-erosive gastritis. negative H.pylori   . Esophageal dilation N/A 11/12/2014    Procedure: ESOPHAGEAL DILATION;  Surgeon: Danie Binder, MD;  [facility];  Service: Endoscopy;  Laterality: N/A;    There were no vitals filed for this visit.  Visit Diagnosis:  Midline low back pain with sciatica, sciatica laterality unspecified  Muscle weakness  Back stiffness  Hip stiffness, unspecified laterality  Poor posture      Subjective Assessment - 07/11/15 1520     Subjective Patient reports that he is tired today, did a lot of walking, at least a mile, this morning; also reports he still thinks he would like to hold off before doing any more PT for now    Pertinent History Patient reports that his back has been hurting for awhile; really started getting worse last October. Can't remember doing anything that made his back worse, just started out of nowhere. States that awhile ago MD said not to pick up anything over 10 pounds. No hip problems. Pain does run down his legs on both sides, front and back.    Currently in Pain? No/denies  reports that he is just having soreness in that one spot    Pain Score 0-No pain   Pain Location Back                         [facility] Adult PT Treatment/Exercise - 07/11/15 0001    Lumbar Exercises: Stretches   Active Hamstring Stretch 3 reps;30 seconds   Active Hamstring Stretch Limitations 12 inch box    Passive Hamstring Stretch 3 reps;30 seconds   Passive Hamstring Stretch Limitations gastroc on slantboard    Single Knee to Chest Stretch 5 reps;10 seconds   Lower Trunk Rotation 5 reps;10 seconds   Piriformis Stretch 3 reps;30 seconds   Piriformis Stretch Limitations seated   Lumbar Exercises: Standing   Other Standing Lumbar Exercises  3D hip excursions 1x15    Lumbar Exercises: Supine   Ab Set 20 reps   AB Set Limitations 3 second holds    Bridge 20 reps   Straight Leg Raise 15 reps   Other Supine Lumbar Exercises sciatic nerve flossing with sheet over ball of foot with cues for form 1x10 each side    Lumbar Exercises: Sidelying   Hip Abduction --   Hip Abduction Weights (lbs) --                PT Education - 07/11/15 1816    Education provided Yes   Education Details DC today, advanced HEP    Person(s) Educated Patient   Methods Explanation;Handout   Comprehension Verbalized understanding;Returned demonstration          PT Short Term Goals - 07/08/15 1538    PT SHORT TERM  GOAL #1   Title Patient will be able to verbalize the importance of good posture, and will be able to maintain improved posture in sitting and standing with cues no more than 20% of the time    Time 3   Period Weeks   Status On-going   PT SHORT TERM GOAL #2   Title Patient will improve bilateral hip internal rotation to at least 42 degrees to improve functional mechanics and mobility    Time 3   Period Weeks   Status On-going   PT SHORT TERM GOAL #3   Title Patient will improve lumbar rotation to being only 30% limited bilaterally in order to improve functional mechanics and reduce pain    Time 3   Period Weeks   Status On-going   PT SHORT TERM GOAL #4   Title patient will be independent in correctly and consistently performing HEP, to be updated PRN    Time 3   Period Weeks   Status Achieved           PT Long Term Goals - 07/08/15 1539    PT LONG TERM GOAL #1   Title Patient will demonstrate 5/5 muscle srength in all muscles tested in order to reduce pain and improve functional mechanics    Time 6   Period Weeks   Status On-going   PT LONG TERM GOAL #2   Title Patient will report that his pain is 0/10 at least 80% of the time with 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    Period Weeks   Status On-going   PT LONG TERM GOAL #3   Title patient will demonstrate reduced rigidity in lumbar and sacral PAs in order to evidence improved spinal mobiltiy and contribute to improved function and overall reduced pain    Time 8   Period Weeks   Status On-going   PT LONG TERM GOAL #4   Title Patient will perform at least 30 minutes of exercise of light to moderate intensity at least 3 days per week in order to maintain functional gains and promote overall improved health status    Time 6   Period Weeks   Status On-going               Plan - 07/11/15 1817    Clinical Impression Statement Focused on functional stretching and proximal  muscle activiation as well as introduction of sciatic nerve flossing in modified position, which patient demonstrated ability to perform correctly and safely. Patient re-affirms that at this time he would like today to be his last  day with skilled PT services. He pointed to the exact sore spot o his back, approximately L3-4, but with the excpetion of increased muscle tension in L lumbar spinal musculature, did not identify any muscle knotting that might explain this pain. Patient was given advanced HEP  which he has been successful wtih in the past; DC today per patient request.    Pt will benefit from skilled therapeutic intervention in order to improve on the following deficits Abnormal gait;Hypomobility;Decreased activity tolerance;Decreased strength;Pain;Decreased balance;Decreased mobility;Difficulty walking;Improper body mechanics;Decreased coordination;Impaired flexibility;Postural dysfunction   Rehab Potential Excellent   PT Treatment/Interventions ADLs/Self Care Home Management;Gait training;Stair training;Functional mobility training;Therapeutic activities;Therapeutic exercise;Balance training;Neuromuscular re-education;Patient/family education;Manual techniques   PT Next Visit Plan DC today    PT Home Exercise Plan given    Consulted and Agree with Plan of Care Patient          G-Codes - 08/10/15 1819    Functional Assessment Tool Used FOTO 56% limited    Functional Limitation Mobility: Walking and moving around   Mobility: Walking and Moving Around Goal Status [phone]) At least 40 percent but less than 60 percent impaired, limited or restricted   Mobility: Walking and Moving Around Discharge Status [phone]) At least 40 percent but less than 60 percent impaired, limited or restricted      Problem List Patient Active Problem List   Diagnosis Date Noted  . Microcytic anemia 06/25/2015  . LEMS (Lambert-Eaton myasthenic syndrome) ([place]) 06/25/2015  . B12 deficiency 06/25/2015  . Loss of  weight 10/26/2014  . Dysphagia, pharyngoesophageal phase 10/26/2014  . Low back pain 08-09-14  . Orthostatic hypotension 07/07/2014  . Orthostasis 07/07/2014   PHYSICAL THERAPY DISCHARGE SUMMARY  Visits from Start of Care: 10  Current functional level related to goals / functional outcomes: Per recent re-assessment patient has not made significant progress towards achieving short and long term goals, and he reports at thsi time that he would like to be discharged to advanced HEP for now. Did identify some possible neural tightness and addressed with inclusion of sciatic flossing in HEP.    Remaining deficits: Poor posture, functional weakness, back pain, reduced functional activity tolerance    Education / Equipment: Advanced HEP, DC today  Plan: Patient agrees to discharge.  Patient goals were not met. Patient is being discharged due to the patient's request.  ?????       Deniece Ree PT, DPT [place], [place], [address] Phone: [phone]   Fax:  [phone]  Name: GRANTHAM HIPPERT MRN: [id number] Date of Birth: [birth year]

## 2015-07-22 NOTE — Assessment & Plan Note (Signed)
TCS/EGD up-to-date. Stool cards ordered through hematology. Will follow-up on findings. If positive, consider capsule study.

## 2015-07-23 NOTE — Progress Notes (Signed)
CC'D TO PCP °

## 2015-07-29 ENCOUNTER — Encounter (HOSPITAL_COMMUNITY): Payer: Medicare Other | Attending: Hematology & Oncology

## 2015-07-29 DIAGNOSIS — R634 Abnormal weight loss: Secondary | ICD-10-CM | POA: Diagnosis not present

## 2015-07-29 DIAGNOSIS — D509 Iron deficiency anemia, unspecified: Secondary | ICD-10-CM | POA: Diagnosis present

## 2015-07-29 DIAGNOSIS — D649 Anemia, unspecified: Secondary | ICD-10-CM | POA: Insufficient documentation

## 2015-07-29 LAB — CBC WITH DIFFERENTIAL/PLATELET
Basophils Absolute: 0.1 10*3/uL (ref 0.0–0.1)
Basophils Relative: 1 %
EOS ABS: 0.3 10*3/uL (ref 0.0–0.7)
Eosinophils Relative: 4 %
HEMATOCRIT: 37 % — AB (ref 39.0–52.0)
HEMOGLOBIN: 12.1 g/dL — AB (ref 13.0–17.0)
LYMPHS ABS: 1.6 10*3/uL (ref 0.7–4.0)
Lymphocytes Relative: 21 %
MCH: 22.7 pg — AB (ref 26.0–34.0)
MCHC: 32.7 g/dL (ref 30.0–36.0)
MCV: 69.4 fL — ABNORMAL LOW (ref 78.0–100.0)
MONO ABS: 0.5 10*3/uL (ref 0.1–1.0)
MONOS PCT: 7 %
NEUTROS ABS: 5.2 10*3/uL (ref 1.7–7.7)
NEUTROS PCT: 68 %
Platelets: 241 10*3/uL (ref 150–400)
RBC: 5.33 MIL/uL (ref 4.22–5.81)
RDW: 15.2 % (ref 11.5–15.5)
WBC: 7.7 10*3/uL (ref 4.0–10.5)

## 2015-07-29 NOTE — Progress Notes (Signed)
..  Erik [name]'s reason for visit today are for labs as scheduled per MD orders.  Venipuncture performed with a 23 gauge butterfly needle to R Antecubital.  Erik [name] tolerated venipuncture well and without incident; questions were answered and patient was discharged.

## 2015-09-10 ENCOUNTER — Ambulatory Visit (INDEPENDENT_AMBULATORY_CARE_PROVIDER_SITE_OTHER): Payer: Medicare Other | Admitting: Cardiology

## 2015-09-10 ENCOUNTER — Encounter: Payer: Self-pay | Admitting: Cardiology

## 2015-09-10 VITALS — BP 124/74 | HR 73 | Ht 71.0 in | Wt 180.0 lb

## 2015-09-10 DIAGNOSIS — I951 Orthostatic hypotension: Secondary | ICD-10-CM | POA: Diagnosis not present

## 2015-09-10 NOTE — Patient Instructions (Signed)
   Stop Florinef.  Stop Midodrine. Continue all other medications.   Your physician wants you to follow up in: 6 months.  You will receive a reminder letter in the mail one-two months in advance.  If you don't receive a letter, please call our office to schedule the follow up appointment

## 2015-09-10 NOTE — Progress Notes (Signed)
Patient ID: Erik [name], male   DOB: 1946-06-15, 70 y.o.   MRN: [id number]     Clinical Summary Erik [name] is a 69 y.o.male seen today for follow up of the following medical problems.   1. Orthostatic hypotension - reports he stopped his meds 2 weeks ago including midodrine and florinef. He states he had a loss of taste and thought his meds may be causing it, and thus stopped everything except his aspirin and vitamins.  - Denies any dizziness since stopping florinef and midodrine. Not using compression stockings. Drinks heavy fluids, heavy salt intake.   Past Medical History  Diagnosis Date  . Seizures ([place])     in remote past   . Chronic back pain   . DDD (degenerative disc disease), lumbar   . COPD (chronic obstructive pulmonary disease) ([place])   . Shortness of breath dyspnea   . GERD (gastroesophageal reflux disease)   . Low blood pressure   . Microcytic anemia 06/25/2015  . LEMS (Lambert-Eaton myasthenic syndrome) ([place]) 06/25/2015  . B12 deficiency 06/25/2015     No Known Allergies   Current Outpatient Prescriptions  Medication Sig Dispense Refill  . albuterol (PROVENTIL HFA;VENTOLIN HFA) 108 (90 BASE) MCG/ACT inhaler Inhale 2 puffs into the lungs every 6 (six) hours as needed for wheezing or shortness of breath.    Marland Kitchen aspirin EC 81 MG tablet Take 81 mg by mouth daily.    . diclofenac (VOLTAREN) 75 MG EC tablet Take 75 mg by mouth 2 (two) times daily as needed.    . fludrocortisone (FLORINEF) 0.1 MG tablet Take 2 tablets (0.2 mg total) by mouth 2 (two) times daily. (Patient taking differently: Take 0.1 mg by mouth 2 (two) times daily. Take 3 tablets twice daily) 60 tablet 3  . gabapentin (NEURONTIN) 300 MG capsule Take 1cap at bedtime x7days, then 1cap twice daily x7 days, then 1 cap three times daily 90 capsule 0  . midodrine (PROAMATINE) 10 MG tablet Take 1 tablet (10 mg total) by mouth 3 (three) times daily with meals. 90 tablet 3  . pantoprazole (PROTONIX) 40 MG  tablet Take 1 tablet (40 mg total) by mouth daily. 90 tablet 3  . tiotropium (SPIRIVA) 18 MCG inhalation capsule Place 18 mcg into inhaler and inhale daily.    . vitamin B-12 (CYANOCOBALAMIN) 1000 MCG tablet Take 1,000 mcg by mouth daily.    . Zinc 50 MG CAPS Take 50 mg by mouth every morning.     No current facility-administered medications for this visit.     Past Surgical History  Procedure Laterality Date  . None    . Colonoscopy N/A 11/12/2014    Dr. Clayburn Pert external and internal hemorrhoids, redundant left colon  . Esophagogastroduodenoscopy N/A 11/12/2014    Dr. Barnie Alderman non-erosive gastritis. negative H.pylori   . Esophageal dilation N/A 11/12/2014    Procedure: ESOPHAGEAL DILATION;  Surgeon: Danie Binder, MD;  [facility];  Service: Endoscopy;  Laterality: N/A;     No Known Allergies    Family History  Problem Relation Age of Onset  . Colon cancer Neg Hx   . Stomach cancer Maternal Grandfather   . Sickle cell anemia Daughter      Social History Erik [name] reports that he quit smoking about 32 years ago. He does not have any smokeless tobacco history on file. Erik [name] reports that he does not drink alcohol.   Review of Systems CONSTITUTIONAL: No weight loss, fever, chills, weakness or fatigue.  HEENT: Eyes: No visual loss, blurred vision, double vision or yellow sclerae.No hearing loss, sneezing, congestion, runny nose or sore throat.  SKIN: No rash or itching.  CARDIOVASCULAR: no chest pain, no palpitations.  RESPIRATORY: No shortness of breath, cough or sputum.  GASTROINTESTINAL: No anorexia, nausea, vomiting or diarrhea. No abdominal pain or blood.  GENITOURINARY: No burning on urination, no polyuria NEUROLOGICAL: No headache, dizziness, syncope, paralysis, ataxia, numbness or tingling in the extremities. No change in bowel or bladder control.  MUSCULOSKELETAL: No muscle, back pain, joint pain or stiffness.  LYMPHATICS: No enlarged  nodes. No history of splenectomy.  PSYCHIATRIC: No history of depression or anxiety.  ENDOCRINOLOGIC: No reports of sweating, cold or heat intolerance. No polyuria or polydipsia.  Marland Kitchen   Physical Examination Filed Vitals:   09/10/15 1024  BP: 124/74  Pulse: 73   Filed Vitals:   09/10/15 1024  Height: '5\' 11"'$  (1.803 m)  Weight: 180 lb (81.647 kg)    Gen: resting comfortably, no acute distress HEENT: no scleral icterus, pupils equal round and reactive, no palptable cervical adenopathy,  CV: RRR, no m/r/g, no jvd Resp: Clear to auscultation bilaterally GI: abdomen is soft, non-tender, non-distended, normal bowel sounds, no hepatosplenomegaly MSK: extremities are warm, no edema.  Skin: warm, no rash Neuro:  no focal deficits Psych: appropriate affect   Diagnostic Studies 06/2014 Echo Study Conclusions  - Left ventricle: The cavity size was normal. Wall thickness was normal. Systolic function was normal. The estimated ejection fraction was in the range of 55% to 60%. Wall motion was normal; there were no regional wall motion abnormalities. Doppler parameters are consistent with abnormal left ventricular relaxation (grade 1 diastolic dysfunction). - Aortic valve: Trileaflet; mildly thickened leaflets. - Mitral valve: Mildly thickened leaflets . There was trivial regurgitation. - Right atrium: Central venous pressure (est): 3 mm Hg. - Atrial septum: No defect or patent foramen ovale was identified. - Tricuspid valve: There was mild regurgitation. - Pulmonary arteries: PA peak pressure: 34 mm Hg (S). - Pericardium, extracardiac: There was no pericardial effusion.  Impressions:  - Normal LV wall thickness with LVEF 59-29%, grade 1 diastolic dysfunction. Mildly thickened aortic valve. Trivial mitral and mild regurgitation. PASP 34 mmHg.    Assessment and Plan  1. Orthostatic symptoms - he stopped his florinef and midodrine on his own, since that time no new  dizziness. Will continue heavy fluid and salt intake, monitor symptoms. If reoccur will start back meds   F/u 6 months      Arnoldo Lenis, M.D.

## 2015-09-18 ENCOUNTER — Ambulatory Visit (HOSPITAL_COMMUNITY)
Admission: RE | Admit: 2015-09-18 | Discharge: 2015-09-18 | Disposition: A | Discharge status: Home / Self Care | Payer: Medicare Other | Source: Ambulatory Visit | Attending: Oncology | Admitting: Oncology

## 2015-09-18 DIAGNOSIS — M545 Low back pain: Secondary | ICD-10-CM | POA: Insufficient documentation

## 2015-09-18 DIAGNOSIS — R911 Solitary pulmonary nodule: Secondary | ICD-10-CM | POA: Insufficient documentation

## 2015-09-18 DIAGNOSIS — R0602 Shortness of breath: Secondary | ICD-10-CM | POA: Diagnosis not present

## 2015-09-18 DIAGNOSIS — G7080 Lambert-Eaton syndrome, unspecified: Secondary | ICD-10-CM | POA: Diagnosis not present

## 2015-09-18 DIAGNOSIS — K5900 Constipation, unspecified: Secondary | ICD-10-CM | POA: Diagnosis not present

## 2015-09-18 DIAGNOSIS — J449 Chronic obstructive pulmonary disease, unspecified: Secondary | ICD-10-CM | POA: Diagnosis not present

## 2015-09-18 DIAGNOSIS — K219 Gastro-esophageal reflux disease without esophagitis: Secondary | ICD-10-CM | POA: Insufficient documentation

## 2015-09-18 DIAGNOSIS — D509 Iron deficiency anemia, unspecified: Secondary | ICD-10-CM | POA: Diagnosis not present

## 2015-09-18 DIAGNOSIS — I2510 Atherosclerotic heart disease of native coronary artery without angina pectoris: Secondary | ICD-10-CM | POA: Diagnosis not present

## 2015-09-18 LAB — POCT I-STAT CREATININE: CREATININE: 1.3 mg/dL — AB (ref 0.61–1.24)

## 2015-09-18 MED ORDER — IOHEXOL 300 MG/ML  SOLN
100.0000 mL | Freq: Once | INTRAMUSCULAR | Status: AC | PRN
  Administered 2015-09-18: 100 mL via INTRAVENOUS

## 2015-09-19 ENCOUNTER — Encounter: Payer: Self-pay | Admitting: Neurology

## 2015-09-19 ENCOUNTER — Ambulatory Visit (INDEPENDENT_AMBULATORY_CARE_PROVIDER_SITE_OTHER): Payer: Medicare Other | Admitting: Neurology

## 2015-09-19 VITALS — BP 118/70 | HR 87 | Ht 71.0 in | Wt 171.7 lb

## 2015-09-19 DIAGNOSIS — G7080 Lambert-Eaton syndrome, unspecified: Secondary | ICD-10-CM

## 2015-09-19 DIAGNOSIS — M5440 Lumbago with sciatica, unspecified side: Secondary | ICD-10-CM

## 2015-09-19 MED ORDER — BACLOFEN 10 MG PO TABS: 10.0000 mg | ORAL_TABLET | Freq: Three times a day (TID) | ORAL | Status: DC | PRN

## 2015-09-19 NOTE — Progress Notes (Signed)
NEUROLOGY FOLLOW UP OFFICE NOTE  Erik [name] [id number]  HISTORY OF PRESENT ILLNESS: Erik [name] is a 70 year old right-handed male with orthostatic hypotension who follows up to review NCV-EMG results for leg weakness and back pain.  Recent history obtained by patient and heme/onc note.    UPDATE: Voltage-gated calcium channel antibodies were positive. CT of chest showed a 9 mm sub sold nodule within the right upper lobe.  CT of abdomen and pelvis was unremarkable. He was referred to heme/onc.  He had repeat CT CAP yesterday, revealing that the right upper lobe nodule as resolved.  There is a subcentimeter nodularity in the perivascular space, which is unchanged from prior exam.  He denies dysphagia at this time.  He denies diplopia, limb weakness, neck weakness or shortness of breath. Back pain is still an issue.  He reports aching pain in the low back and thighs.  Gabapentin and PT were ineffective.  HISTORY: He developed low back pain about a year ago.  There was no precipitating event.  He reports mid low back pain with diffuse pain radiating down both legs to the feet.  He describes it as a deep burning pain.  It occurs in all positions, either laying down, sitting or ambulating.  He notes some weakness in the legs as well.  He denies numbness.  For the past year, he uses a cane.  He denies bowel or bladder dysfunction but reports impotence over the past year.  He has orthostatic hypotension and takes Florinef and midodrine.  He notes mild neck pain.  MRI of the lumbar spine from 07/17/14 showed spondylosis and degenerative disc disease with disc bulge causing mild bilateral foraminal stenosis at L3-4 and disc bulge at L4-5 causing mild right bilateral foraminal stenosis and right greater than left facet arthropathy.  He saw a neurologist in [place].  NCV-EMG demonstrated demyelinating peripheral neuropathy.  He underwent a lumbar puncture in April 2016 to evaluate for  CIDP.  CSF protein was 23.  Glucose was 60.  Cell count was 0.  Labs in May revealed CBC with chronic mild microcytic anemia, Sed Rate 18, Kappa free light chain 23.29, normal IgG, IgA and IgM, normal SPEP and IFE, B12 205.  He does report that he feels weaker in the mornings, and improves later in the day.  He is a former smoker.  As per EPIC chart, he has had a 14 lb weight loss over the past year, although he and his daughter report a 40 lb weight loss.  He has poor appetite, however.  A NCV-EMG was performed on 06/11/15.  Findings did not show peripheral neuropathy, but it did reveal post-exercise facilitation of motor responses, suggesting a pre-synaptic neuromuscular junction disorder.  PAST MEDICAL HISTORY: Past Medical History  Diagnosis Date  . Seizures ([place])     in remote past   . Chronic back pain   . DDD (degenerative disc disease), lumbar   . COPD (chronic obstructive pulmonary disease) ([place])   . Shortness of breath dyspnea   . GERD (gastroesophageal reflux disease)   . Low blood pressure   . Microcytic anemia 06/25/2015  . 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 B12 deficiency 06/25/2015    MEDICATIONS: Current Outpatient Prescriptions on File Prior to Visit  Medication Sig Dispense Refill  . aspirin EC 81 MG tablet Take 81 mg by mouth daily.    . vitamin B-12 (CYANOCOBALAMIN) 1000 MCG tablet Take 1,000 mcg by mouth daily.    Marland Kitchen  Zinc 50 MG CAPS Take 50 mg by mouth every morning.    Marland Kitchen albuterol (PROVENTIL HFA;VENTOLIN HFA) 108 (90 BASE) MCG/ACT inhaler Inhale 2 puffs into the lungs every 6 (six) hours as needed for wheezing or shortness of breath. Reported on 09/19/2015    . diclofenac (VOLTAREN) 75 MG EC tablet Take 75 mg by mouth 2 (two) times daily as needed. Reported on 09/19/2015    . gabapentin (NEURONTIN) 300 MG capsule Take 1cap at bedtime x7days, then 1cap twice daily x7 days, then 1 cap three times daily (Patient not taking: Reported on 09/10/2015) 90  capsule 0  . pantoprazole (PROTONIX) 40 MG tablet Take 1 tablet (40 mg total) by mouth daily. (Patient not taking: Reported on 09/10/2015) 90 tablet 3  . tiotropium (SPIRIVA) 18 MCG inhalation capsule Place 18 mcg into inhaler and inhale daily. Reported on 09/19/2015     No current facility-administered medications on file prior to visit.    ALLERGIES: No Known Allergies  FAMILY HISTORY: Family History  Problem Relation Age of Onset  . Colon cancer Neg Hx   . Stomach cancer Maternal Grandfather   . Sickle cell anemia Daughter     SOCIAL HISTORY: Social History   Social History  . Marital Status: Legally Separated    Spouse Name: N/A  . Number of Children: N/A  . Years of Education: N/A   Occupational History  . Not on file.   Social History Main Topics  . Smoking status: Former Smoker -- 0.50 packs/day for 21 years    Types: Cigarettes    Start date: 01/22/1963    Quit date: 08/11/1983  . Smokeless tobacco: Never Used  . Alcohol Use: No     Comment: remote past, history of ETOH abuse.   . Drug Use: No  . Sexual Activity: Not on file   Other Topics Concern  . Not on file   Social History Narrative    REVIEW OF SYSTEMS: Constitutional: No fevers, chills, or sweats, no generalized fatigue, change in appetite Eyes: No visual changes, double vision, eye pain Ear, nose and throat: No hearing loss, ear pain, nasal congestion, sore throat Cardiovascular: No chest pain, palpitations Respiratory:  No shortness of breath at rest or with exertion, wheezes GastrointestinaI: No nausea, vomiting, diarrhea, abdominal pain, fecal incontinence Genitourinary:  No dysuria, urinary retention or frequency Musculoskeletal:  back pain Integumentary: No rash, pruritus, skin lesions Neurological: as above Psychiatric: No depression, insomnia, anxiety Endocrine: No palpitations, fatigue, diaphoresis, mood swings, change in appetite, change in weight, increased  thirst Hematologic/Lymphatic:  No anemia, purpura, petechiae. Allergic/Immunologic: no itchy/runny eyes, nasal congestion, recent allergic reactions, rashes  PHYSICAL EXAM: Filed Vitals:   09/19/15 0859  BP: 118/70  Pulse: 87   General: No acute distress.  Patient appears well-groomed.  normal body habitus. Head:  Normocephalic/atraumatic Eyes:  Fundoscopic exam unremarkable without vessel changes, exudates, hemorrhages or papilledema. Neck: supple, no paraspinal tenderness, full range of motion Heart:  Regular rate and rhythm Lungs:  Clear to auscultation bilaterally Back: bilateral paraspinal tenderness Neurological Exam: alert and oriented to person, place, and time. Attention span and concentration intact, recent and remote memory intact, fund of knowledge intact.  Speech fluent and not dysarthric, language intact.  CN II-XII intact. Fundoscopic exam unremarkable without vessel changes, exudates, hemorrhages or papilledema.  Bulk and tone normal, muscle strength 5/5 throughout.  Sensation to light touch, temperature and vibration intact.  Deep tendon reflexes 2+ throughout, toes downgoing.  Finger to nose and heel  to shin testing intact.  Antalgic gait.  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  He does have some mild foraminal stenosis but nothing significant  PLAN: 1.  As patient does not exhibit any muscle weakness, would refrain from treatment for LEMS at this time. 2.  Paraneoplastic source not found at this time.  Will need to continue monitoring along with heme/onc 3.  For back pain, will start baclofen and refer to pain management for possible injections. 4.  Follow up in 6 months or as needed (particularly if he notes increased muscle weakness, diplopia, dysphagia)  15 minutes spent face to face with patient, over 50% spent discussing diagnosis and management.  Metta Clines, DO  CC:  [name], NP

## 2015-09-19 NOTE — Progress Notes (Signed)
Chart forwarded.  

## 2015-09-19 NOTE — Patient Instructions (Signed)
1.  For back pain, I will prescribe you baclofen '10mg'$ , which you may take every 8 hours as needed.  Will refer you to pain specialist for possible epidural injections 2.  Follow up with oncology 3.  Since you exhibit no symptoms of LEMS such as weakness, I would continue to monitor for now 4.  Follow up in 6 months or as needed.

## 2015-09-29 IMAGING — CT CT ANGIO CHEST
2 of 8 series · 18 of 46 positions shown · IV contrast (Omni 300)
Comparison: None.

CLINICAL DATA: Short of breath, back pain

EXAM:
CT ANGIOGRAPHY CHEST WITH CONTRAST
TECHNIQUE: Multidetector CT imaging of the chest was performed using the
standard protocol during bolus administration of intravenous
contrast. Multiplanar CT image reconstructions and MIPs were
obtained to evaluate the vascular anatomy.
CONTRAST:  64mL OMNIPAQUE IOHEXOL 350 MG/ML SOLN

[Series 5: thins · axial · 0.67mm/px · z∈[+1189,+1469]mm · 15 of 310 slices shown]
[im 15/310  lung]
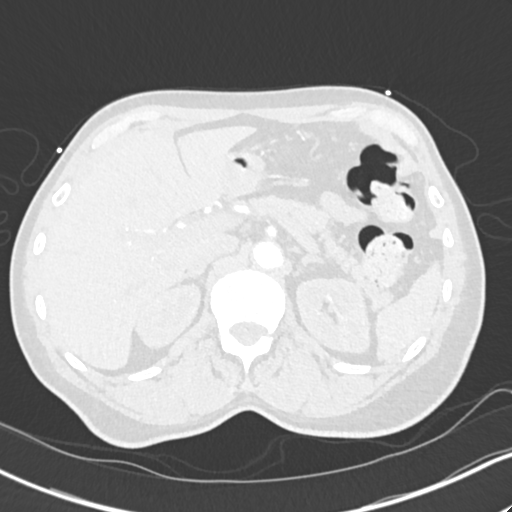
[im 43/310  soft-tissue]
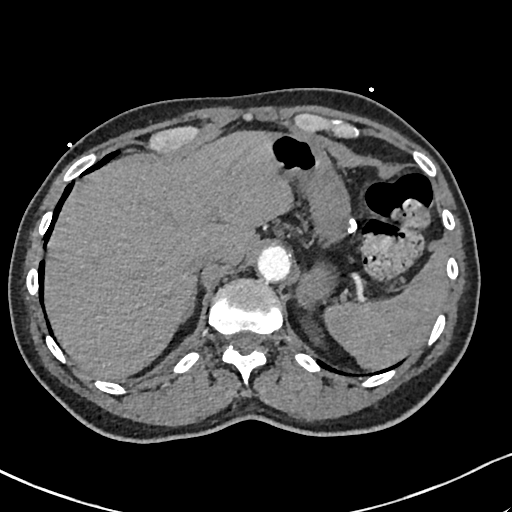
[im 57/310  lung]
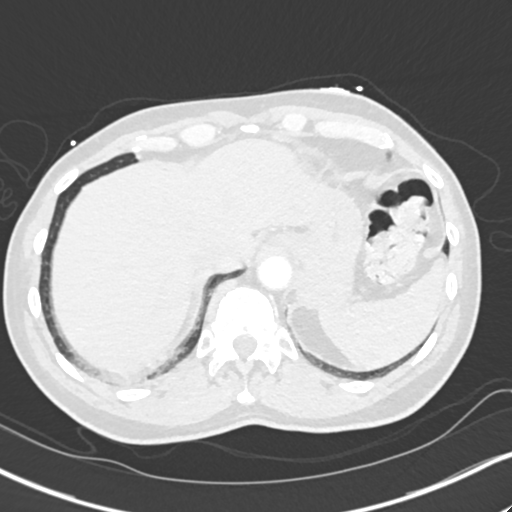
[im 71/310  soft-tissue]
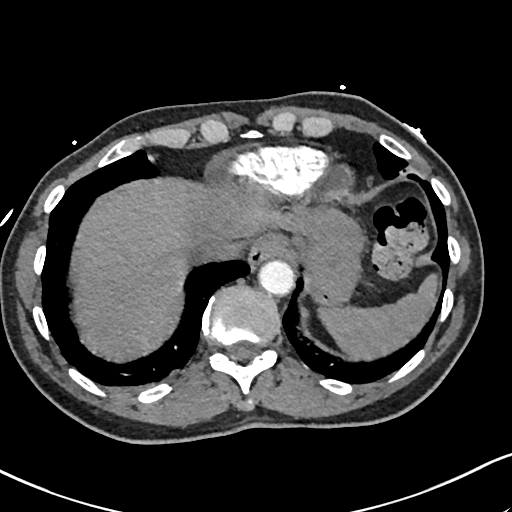
[im 99/310  lung]
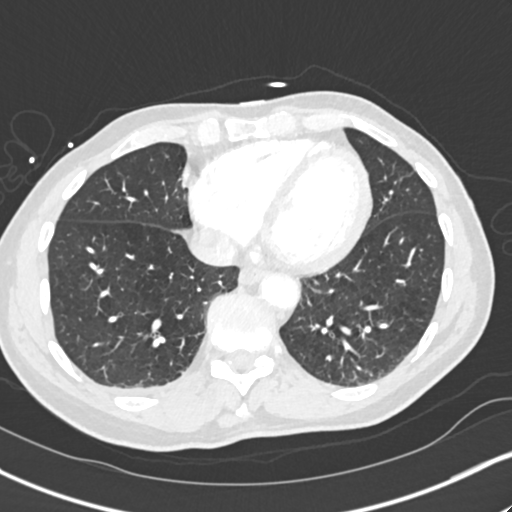
[im 113/310  soft-tissue]
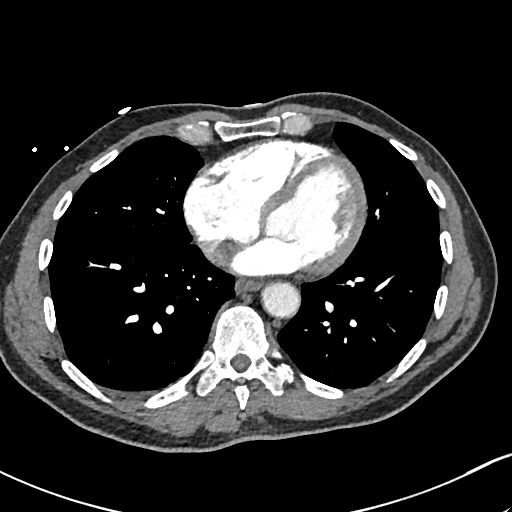
[im 141/310  lung]
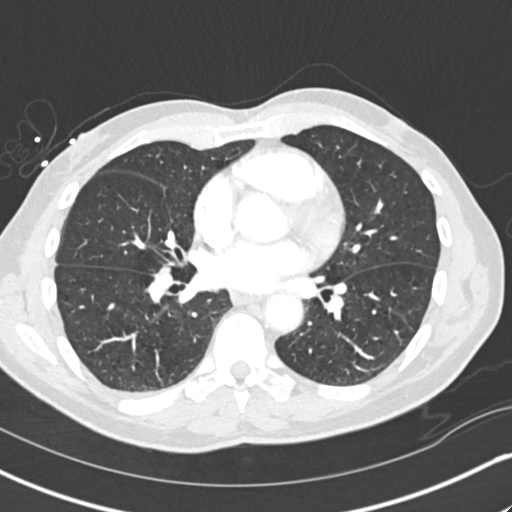
[im 155/310  soft-tissue]
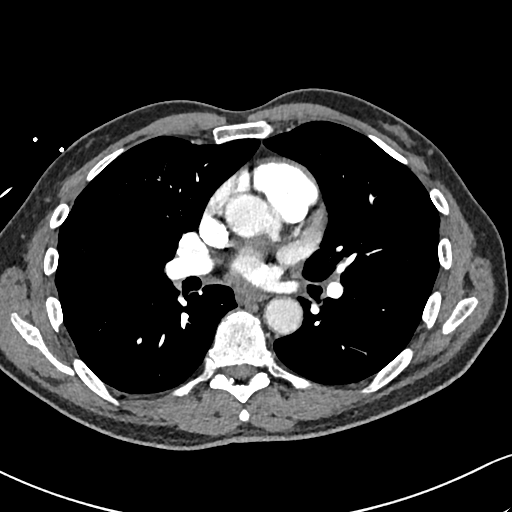
[im 169/310  lung]
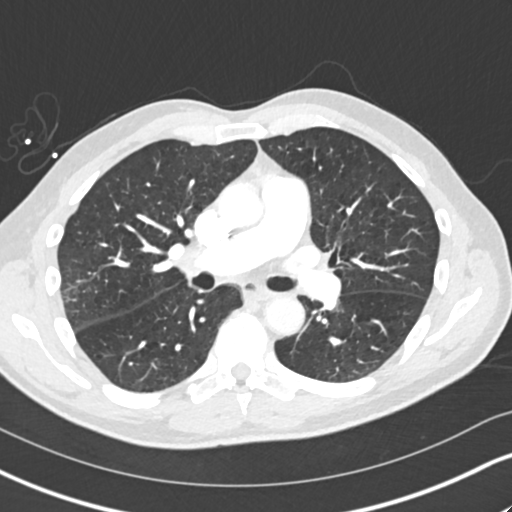
[im 197/310  soft-tissue]
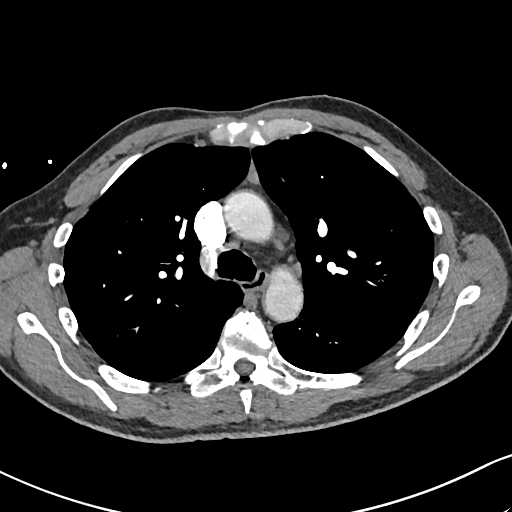
[im 211/310  lung]
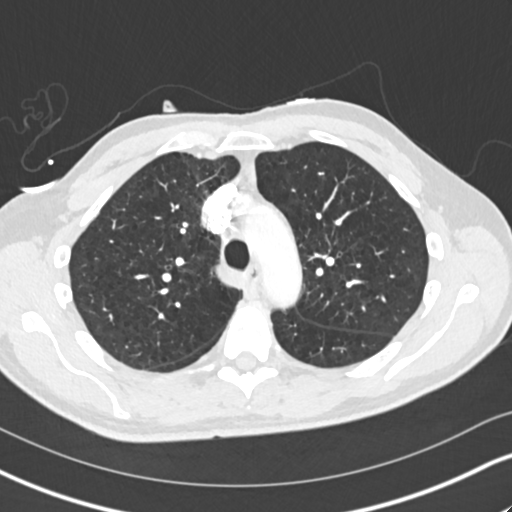
[im 239/310  soft-tissue]
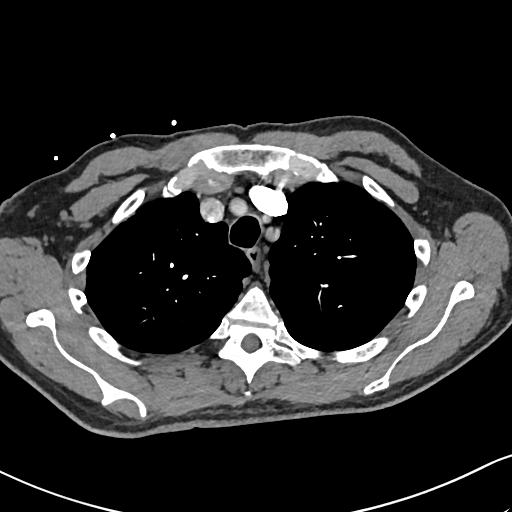
[im 253/310  lung]
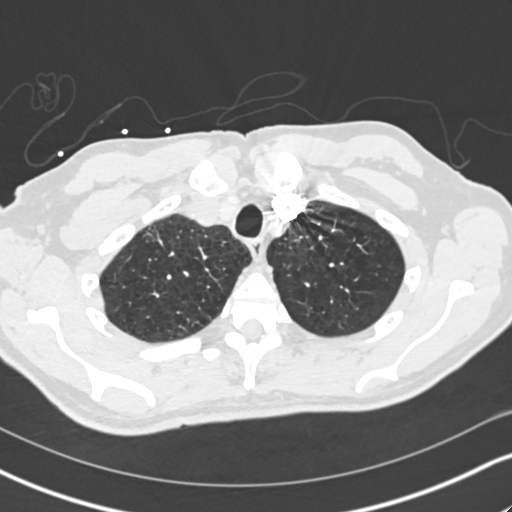
[im 267/310  soft-tissue]
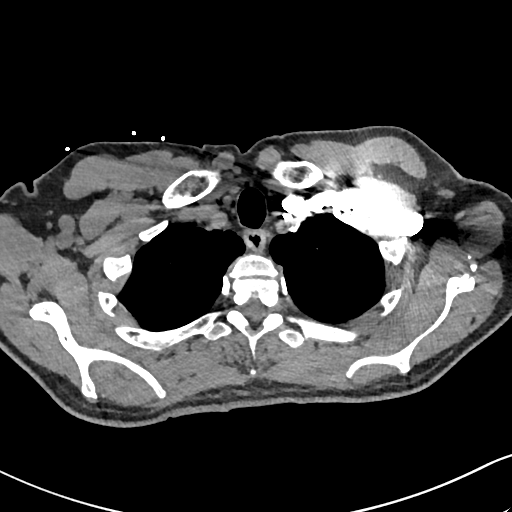
[im 295/310  lung]
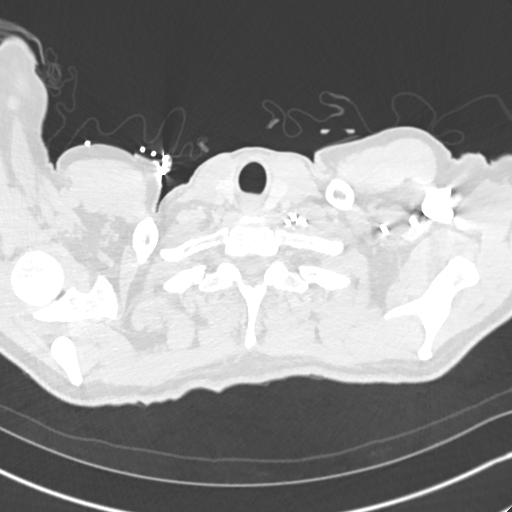

[Series 7: coronal mpr · coronal · 0.62mm/px · 3 of 107 slices shown]
[im 27/107  soft-tissue]
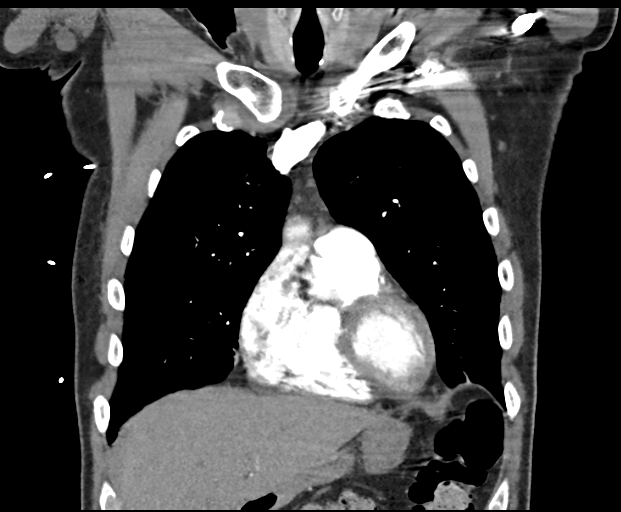
[im 54/107  soft-tissue]
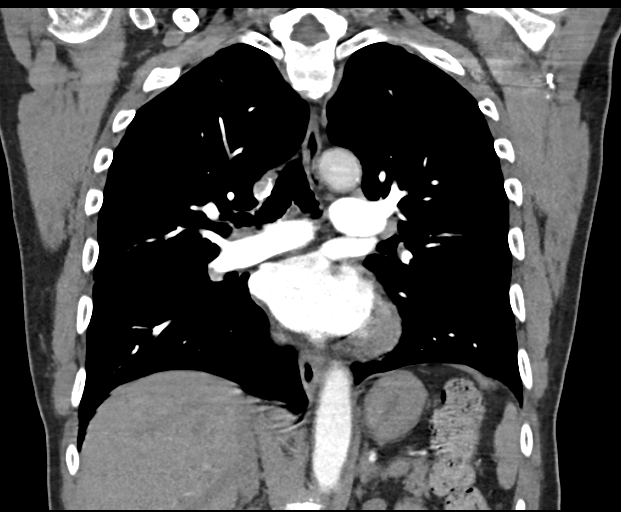
[im 80/107  soft-tissue]
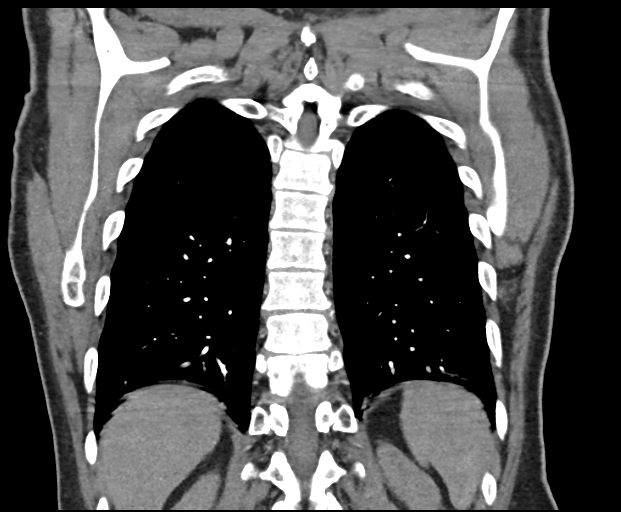

[18 of 46 positions shown; findings below may reference images not displayed]

FINDINGS: No filling defects within the pulmonary arteries to suggest acute
pulmonary embolism. No acute findings of the aorta or great vessels.
No pericardial fluid. Esophagus is normal.

Lung parenchyma demonstrates centrilobular emphysema in the upper
lobes.

There is no axillary supraclavicular adenopathy. There is a
prevascular lymph node measuring 5 mm. No paratracheal adenopathy.
No hilar adenopathy

Limited view of the upper abdomen is unremarkable. Limited view of
the skeleton demonstrates degenerate spurring of the endplates.

Review of the MIP images confirms the above findings.
IMPRESSION: 1. No evidence of acute pulmonary embolism.
2. No acute pulmonary parenchymal findings.
3. Centrilobular emphysema in upper lobes.
4. Degenerative change of the spine.

## 2015-09-29 IMAGING — DX DG LUMBAR SPINE COMPLETE 4+V
5 series · 5 of 5 positions shown · non-contrast
Comparison: 05/11/2013

CLINICAL DATA: Severe low back pain for 1 month. Radiates down both
legs.

EXAM:
LUMBAR SPINE - COMPLETE 4+ VIEW

[l-spine ap]
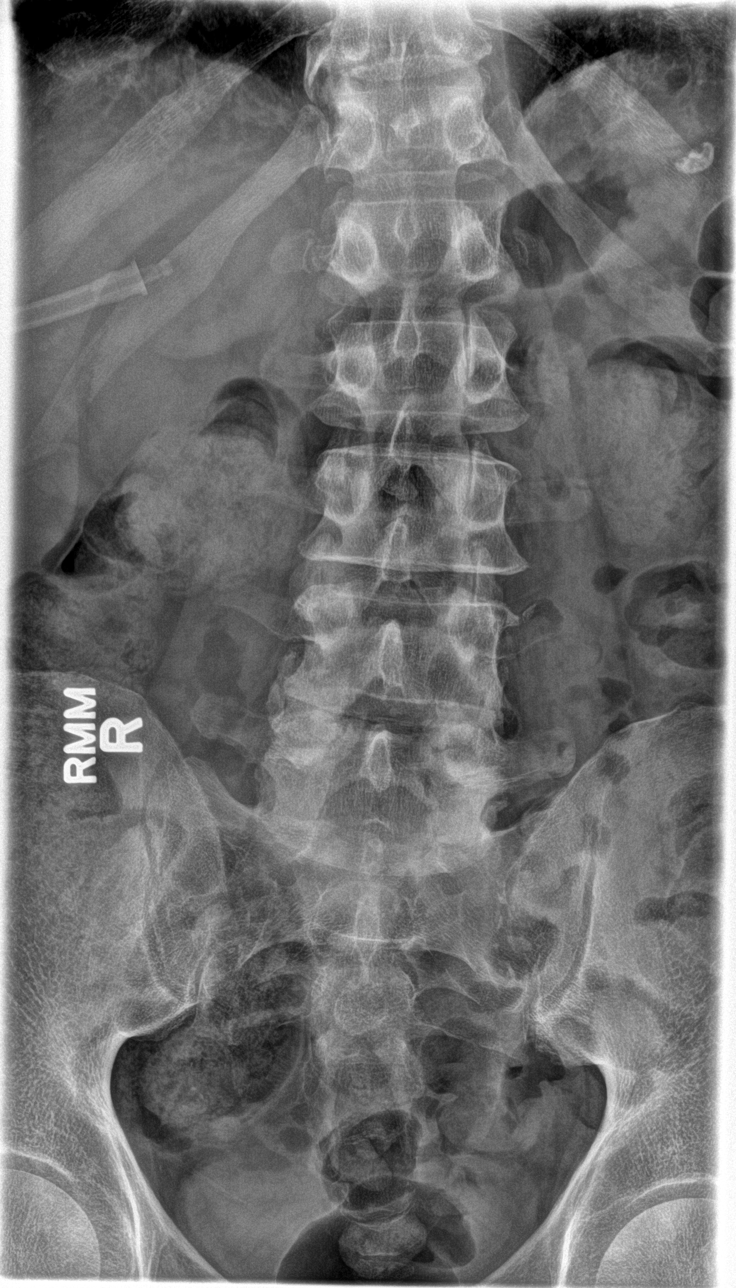

[l-spine obl (1 of 2)]
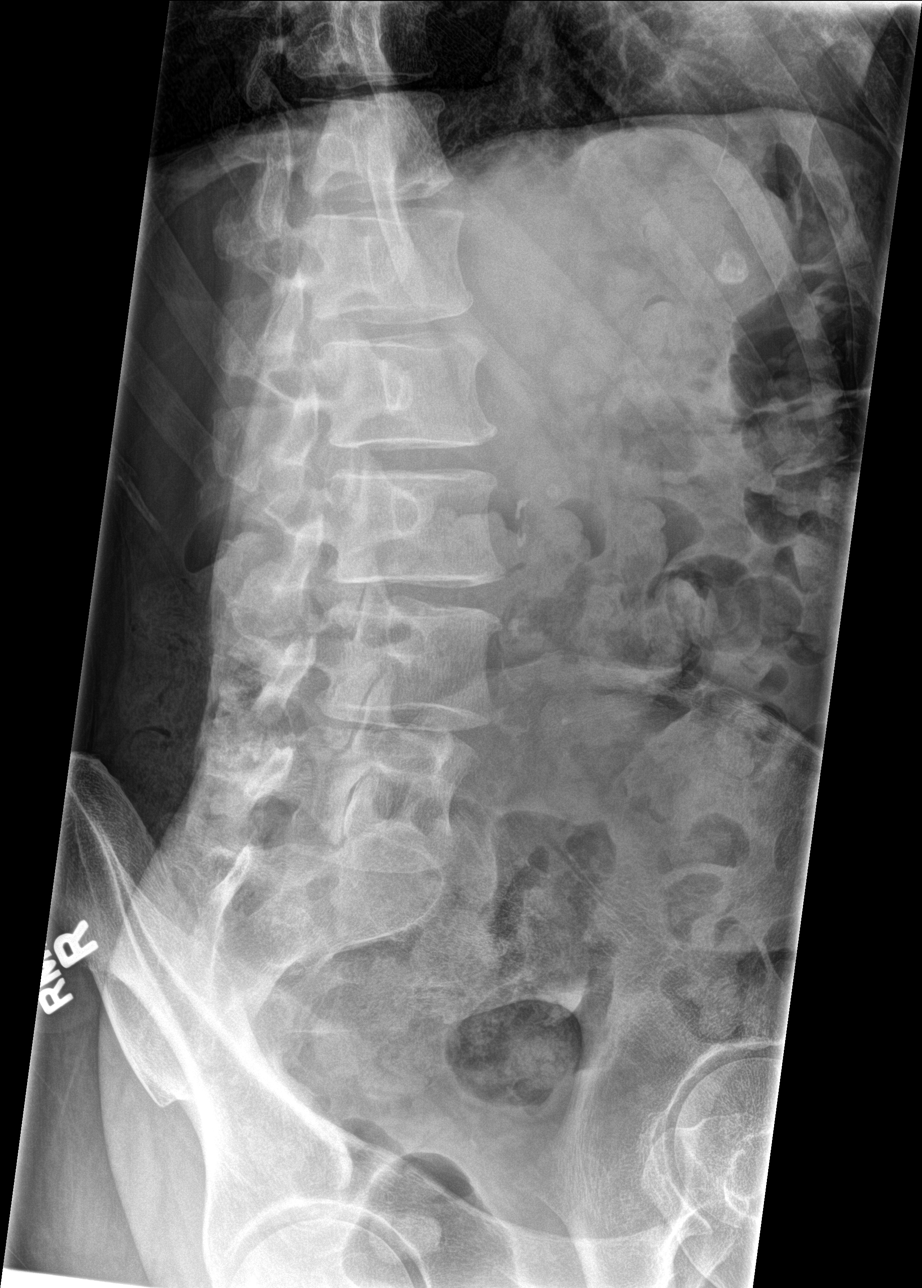

[l-spine obl (2 of 2)]
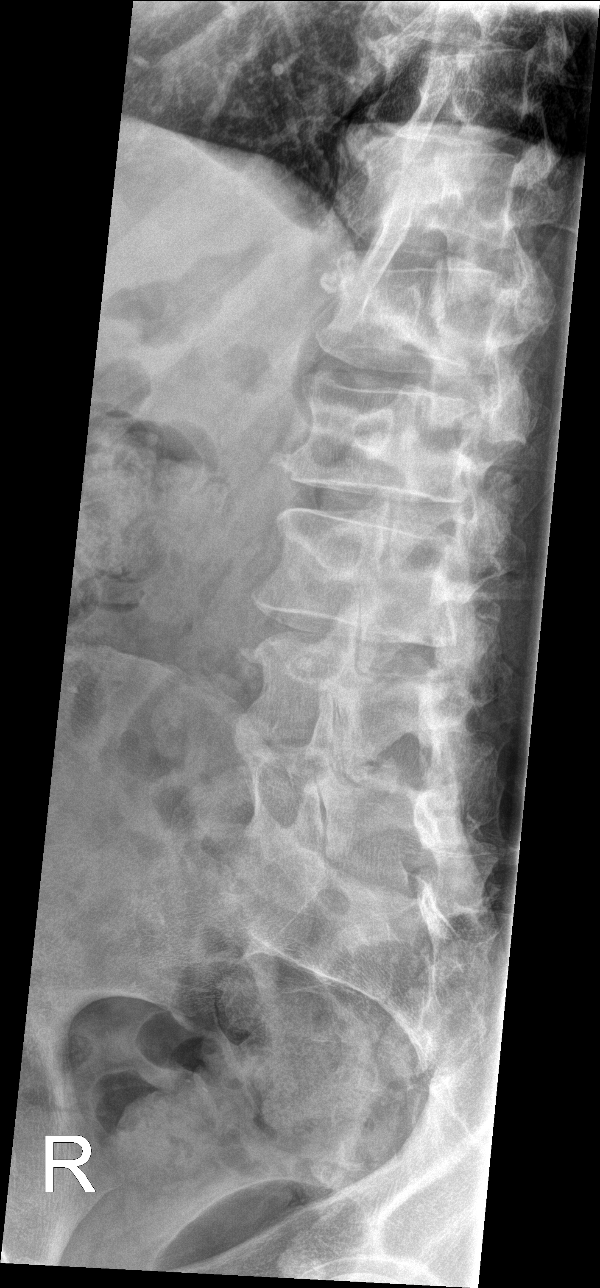

[l-spine lat]
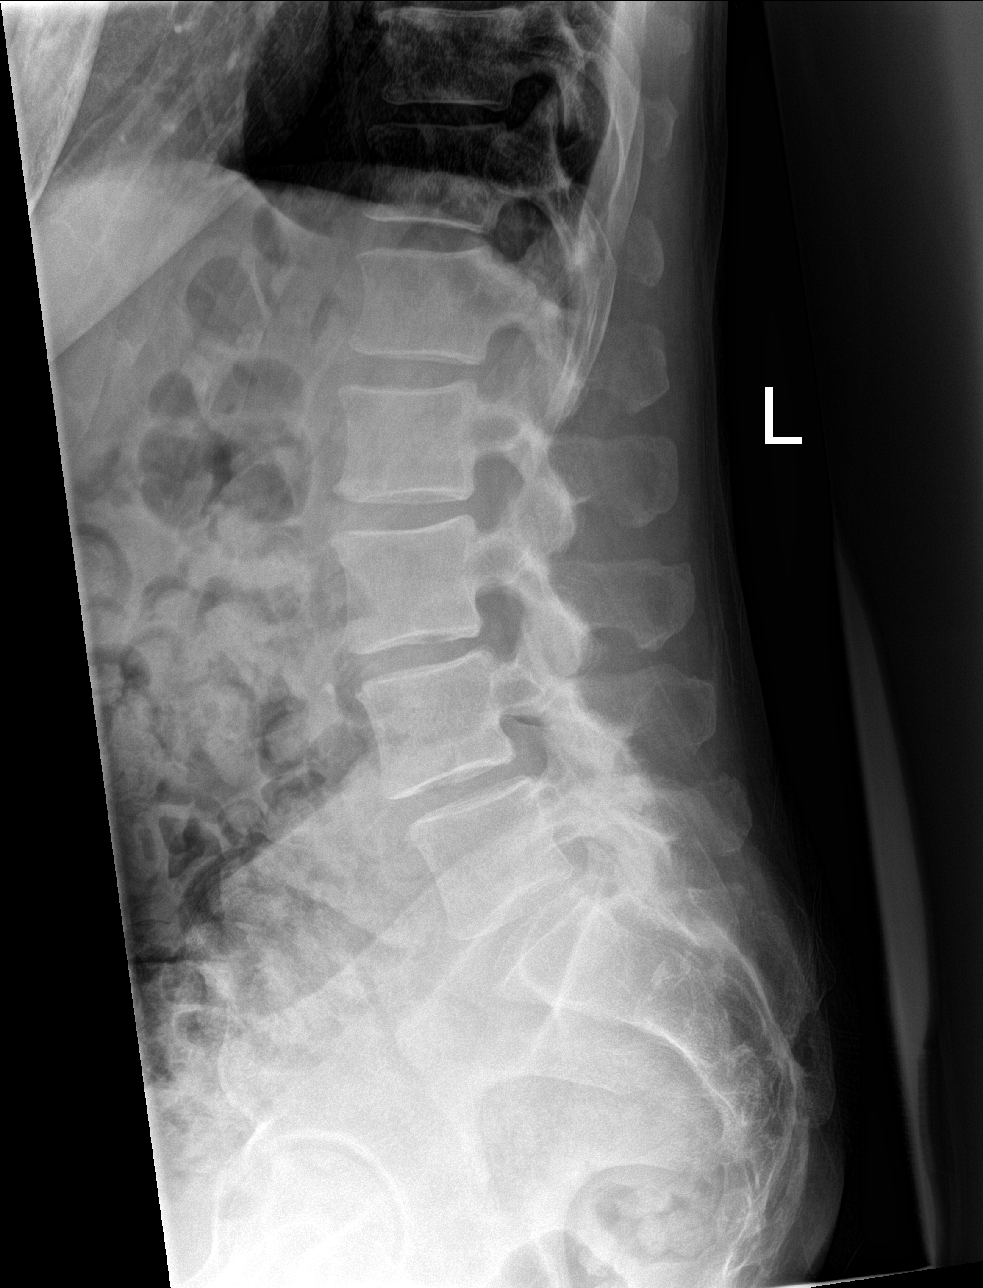

[l-spine spot]
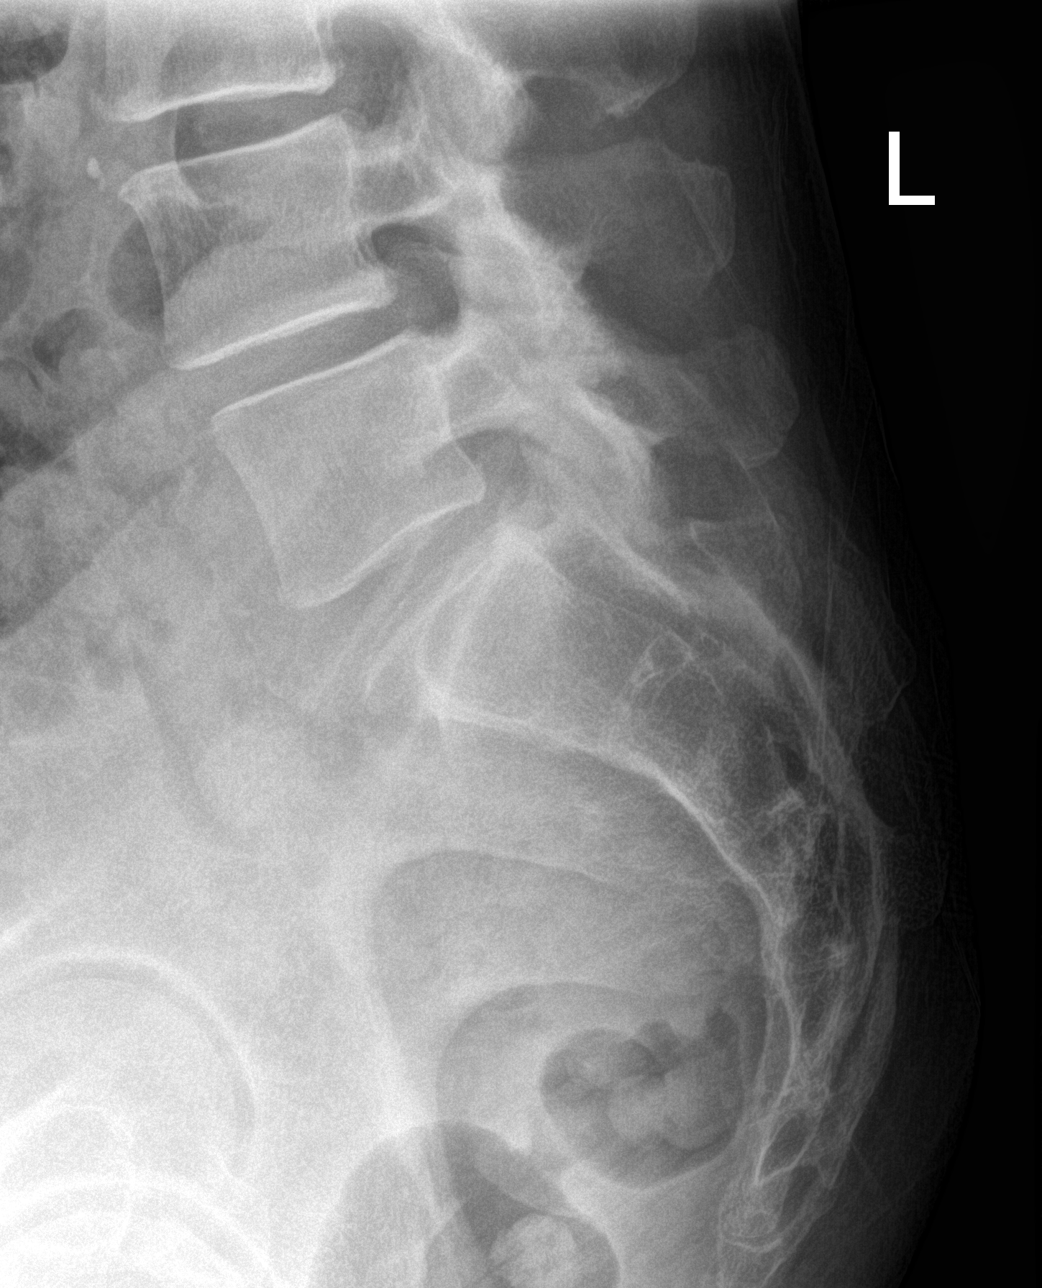

[5 of 5 positions shown; findings below may reference images not displayed]

FINDINGS: Degenerative disc disease in the mid and lower lumbar spine. Slight
anterolisthesis of L4 on L5, stable. Disc spaces are maintained. No
fracture. SI joints are symmetric and unremarkable.
IMPRESSION: Degenerative facet changes.  No acute bony abnormality.

## 2015-10-10 ENCOUNTER — Telehealth: Payer: Self-pay

## 2015-10-10 MED ORDER — BACLOFEN 20 MG PO TABS: 20.0000 mg | ORAL_TABLET | Freq: Three times a day (TID) | ORAL | Status: DC | PRN

## 2015-10-10 NOTE — Telephone Encounter (Signed)
He can increase it to '20mg'$  three times daily.  When does he see the interventional pain specialist?

## 2015-10-10 NOTE — Telephone Encounter (Signed)
Daughter aware. Dr. Primus Bravo has not contacted them yet, referral sent to [facility].

## 2015-10-10 NOTE — Telephone Encounter (Signed)
Daughter, Liliane Bade, called. States that the baclofen has not provided any relief for pt.   A&P from 09/18/14 OV states, " For back pain, will start baclofen and refer to pain management for possible injections."  Please advise

## 2015-10-25 ENCOUNTER — Encounter (HOSPITAL_COMMUNITY): Payer: Self-pay | Admitting: Hematology & Oncology

## 2015-10-25 ENCOUNTER — Encounter (HOSPITAL_COMMUNITY): Payer: Medicare Other

## 2015-10-25 ENCOUNTER — Encounter (HOSPITAL_COMMUNITY): Payer: Medicare Other | Attending: Hematology & Oncology | Admitting: Hematology & Oncology

## 2015-10-25 VITALS — BP 120/65 | HR 86 | Temp 98.0°F | Resp 18 | Wt 176.0 lb

## 2015-10-25 DIAGNOSIS — E538 Deficiency of other specified B group vitamins: Secondary | ICD-10-CM

## 2015-10-25 DIAGNOSIS — D649 Anemia, unspecified: Secondary | ICD-10-CM | POA: Insufficient documentation

## 2015-10-25 DIAGNOSIS — D573 Sickle-cell trait: Secondary | ICD-10-CM | POA: Diagnosis not present

## 2015-10-25 DIAGNOSIS — G7080 Lambert-Eaton syndrome, unspecified: Secondary | ICD-10-CM

## 2015-10-25 DIAGNOSIS — R634 Abnormal weight loss: Secondary | ICD-10-CM | POA: Diagnosis not present

## 2015-10-25 DIAGNOSIS — D509 Iron deficiency anemia, unspecified: Secondary | ICD-10-CM

## 2015-10-25 DIAGNOSIS — D72829 Elevated white blood cell count, unspecified: Secondary | ICD-10-CM | POA: Diagnosis not present

## 2015-10-25 LAB — CBC WITH DIFFERENTIAL/PLATELET
BASOS ABS: 0.1 10*3/uL (ref 0.0–0.1)
BASOS PCT: 1 %
EOS ABS: 0.3 10*3/uL (ref 0.0–0.7)
EOS PCT: 2 %
HCT: 35.4 % — ABNORMAL LOW (ref 39.0–52.0)
HEMOGLOBIN: 11.5 g/dL — AB (ref 13.0–17.0)
LYMPHS ABS: 1.6 10*3/uL (ref 0.7–4.0)
Lymphocytes Relative: 10 %
MCH: 22.2 pg — AB (ref 26.0–34.0)
MCHC: 32.5 g/dL (ref 30.0–36.0)
MCV: 68.2 fL — ABNORMAL LOW (ref 78.0–100.0)
Monocytes Absolute: 1.3 10*3/uL — ABNORMAL HIGH (ref 0.1–1.0)
Monocytes Relative: 8 %
NEUTROS PCT: 79 %
Neutro Abs: 12.4 10*3/uL — ABNORMAL HIGH (ref 1.7–7.7)
PLATELETS: 488 10*3/uL — AB (ref 150–400)
RBC: 5.19 MIL/uL (ref 4.22–5.81)
RDW: 14.7 % (ref 11.5–15.5)
WBC: 15.7 10*3/uL — AB (ref 4.0–10.5)

## 2015-10-25 LAB — COMPREHENSIVE METABOLIC PANEL
ALBUMIN: 3.2 g/dL — AB (ref 3.5–5.0)
ALK PHOS: 101 U/L (ref 38–126)
ALT: 38 U/L (ref 17–63)
AST: 29 U/L (ref 15–41)
Anion gap: 11 (ref 5–15)
BUN: 19 mg/dL (ref 6–20)
CALCIUM: 9.1 mg/dL (ref 8.9–10.3)
CHLORIDE: 99 mmol/L — AB (ref 101–111)
CO2: 28 mmol/L (ref 22–32)
CREATININE: 1.39 mg/dL — AB (ref 0.61–1.24)
GFR calc non Af Amer: 50 mL/min — ABNORMAL LOW (ref >60)
GFR, EST AFRICAN AMERICAN: 58 mL/min — AB (ref >60)
GLUCOSE: 115 mg/dL — AB (ref 65–99)
Potassium: 3.7 mmol/L (ref 3.5–5.1)
SODIUM: 138 mmol/L (ref 135–145)
Total Bilirubin: 0.5 mg/dL (ref 0.3–1.2)
Total Protein: 8.3 g/dL — ABNORMAL HIGH (ref 6.5–8.1)

## 2015-10-25 LAB — VITAMIN B12: Vitamin B-12: 1241 pg/mL — ABNORMAL HIGH (ref 180–914)

## 2015-10-25 NOTE — Patient Instructions (Addendum)
[place] at [facility] Discharge Instructions  RECOMMENDATIONS MADE BY THE CONSULTANT AND ANY TEST RESULTS WILL BE SENT TO YOUR REFERRING PHYSICIAN.   Exam and discussion by Dr Whitney Muse today Call your doctor that prescribed the medication that you thought was helping so you can start taking it again  Lab work is stable today  Return to see the doctor in 3 months with labs  Please call the clinic if you have any questions or concerns    Thank you for choosing [place] at [facility] to provide your oncology and hematology care.  To afford each patient quality time with our provider, please arrive at least 15 minutes before your scheduled appointment time.   Beginning January 23rd 2017 lab work for the [facility] will be done in the  Main lab at [facility] on 1st floor. If you have a lab appointment with the [facility] please come in thru the  Main Entrance and check in at the main information desk  You need to re-schedule your appointment should you arrive 10 or more minutes late.  We strive to give you quality time with our providers, and arriving late affects you and other patients whose appointments are after yours.  Also, if you no show three or more times for appointments you may be dismissed from the clinic at the providers discretion.     Again, thank you for choosing [facility]  Our hope is that these requests will decrease the amount of time that you wait before being seen by our physicians.       _____________________________________________________________  Should you have questions after your visit to [facility], please contact our office at [phone] between the hours of 8:30 a.m. and 4:30 p.m.  Voicemails left after 4:30 p.m. will not be returned until the following business day.  For prescription refill requests, have your pharmacy contact our office.         Resources For Cancer  Patients and their Caregivers ? American Cancer Society: Can assist with transportation, wigs, general needs, runs Look Good Feel Better.        (806)053-2425 ? Cancer Care: Provides financial assistance, online support groups, medication/co-pay assistance.  1-800-813-HOPE (281) 115-9015) ? [place] Assists [place] cancer patients and their families through emotional , educational and financial support.  475-639-3888 ? [place] DSS Where to apply for food stamps, Medicaid and utility assistance. (980)081-0513 ? [facility]: Transportation to medical appointments. (779)561-3812 ? Social Security Administration: May apply for disability if have a Stage IV cancer. 434 297 5219 445-124-0559 ? [employer], Disability and Transit Services: Assists with nutrition, care and transit needs. 478-459-7533

## 2015-10-25 NOTE — Progress Notes (Deleted)
[place] at [facility] Progress Note  Patient Care Team: [name], MD as PCP - General (Nurse Practitioner) Danie Binder, MD as Consulting Physician (Gastroenterology)  CHIEF COMPLAINTS/PURPOSE OF CONSULTATION:  Microcytic Anemia, labs on 07/30/2014 with hgb 12, hct 37.3 and mcv 70.4 Normal iron studies Positive sickle cell screen Colonoscopy 11/12/2014 EGD 11/12/2014 Esophageal dilation 11/12/2014  HISTORY OF PRESENTING ILLNESS:  Erik [name] 70 y.o. male is here because of microcytic anemia. He has been evaluated by GI. He is noted to have a positive sickle cell screen He came in today accompanied by his daughter who is followed by [facility] for sickle cell anemia  HPI  [name] is here alone today. He is getting blood drawn upon walking into the room.  He confirms that his back's been bothering him. He says that getting old "probably has something to do with it." He says "[his back] is not any better" even though his medication was supposed to help. He says they said the pain is due to his sciatic nerve, and that the pain runs down both legs.   [name] is also suffering from a bitter taste in his mouth, no matter what he is eating. He has been taking vitamin B12, but in spite of this, he says that the bitter taste is still there, and that nothing tastes good. He states that he didn't have the bitter taste until he started taking one of his medications, but he is unsure which medication started it.   He also says his appetite isn't any better, and that he "doesn't eat." He confirms that he does force himself to eat, and that his weight has been stable lately because of this. He says his daughter does the cooking around the house and she's a good cook, but that she's been "laying out on the job" of cooking for him lately because she is working two jobs. As a testament to the appetite he's lost, [name] said he used to visit [place] at  least once a week, sometimes twice, and eat four plates whenever he'd visit. He says he usually loves going to [employer] and that he used "waste some money" going there because he had such a good appetite in the past.  He also believes that going on medication in December caused him to lose weight.  He has lost 20 lbs since December.  He denies night sweats, fever, chills. He denies any change in his bowel habits. He denies any other significant complaints other than those detailed above.  MEDICAL HISTORY:  Past Medical History  Diagnosis Date  . Seizures ([place])     in remote past   . Chronic back pain   . DDD (degenerative disc disease), lumbar   . COPD (chronic obstructive pulmonary disease) ([place])   . Shortness of breath dyspnea   . GERD (gastroesophageal reflux disease)   . Low blood pressure   . Microcytic anemia 06/25/2015  . 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 B12 deficiency 06/25/2015    SURGICAL HISTORY: Past Surgical History  Procedure Laterality Date  . None    . Colonoscopy N/A 11/12/2014    Dr. Clayburn Pert external and internal hemorrhoids, redundant left colon  . Esophagogastroduodenoscopy N/A 11/12/2014    Dr. Barnie Alderman non-erosive gastritis. negative H.pylori   . Esophageal dilation N/A 11/12/2014    Procedure: ESOPHAGEAL DILATION;  Surgeon: Danie Binder, MD;  Location: [facility];  Service: Endoscopy;  Laterality:  N/A;    SOCIAL HISTORY: Social History   Social History  . Marital Status: Legally Separated    Spouse Name: N/A  . Number of Children: N/A  . Years of Education: N/A   Occupational History  . Not on file.   Social History Main Topics  . Smoking status: Former Smoker -- 0.50 packs/day for 21 years    Types: Cigarettes    Start date: 01/22/1963    Quit date: 08/11/1983  . Smokeless tobacco: Never Used  . Alcohol Use: No     Comment: remote past, history of ETOH abuse.   . Drug Use: No  . Sexual Activity:  Not on file   Other Topics Concern  . Not on file   Social History Narrative   Separated. 2 children. 1 boy and 1 girl. Quit smoking in the 80's. Quit drinking in the 80's. 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   FAMILY HISTORY: Family History  Problem Relation Age of Onset  . Colon cancer Neg Hx   . Stomach cancer Maternal Grandfather   . Sickle cell anemia Daughter    has no family status information on file.   Daughter has sickle cell anemia. Son has sickle cell trait. Mother deceased 70-60 yo, Alzeheimers. Father deceased close to 13 yo. Unsure what he died from. 2 step brothers. 3 sisters. No one has cancer or blood problems that he knows of.  ALLERGIES:  has No Known Allergies.  MEDICATIONS:  Current Outpatient Prescriptions  Medication Sig Dispense Refill  . aspirin EC 81 MG tablet Take 81 mg by mouth daily.    . vitamin B-12 (CYANOCOBALAMIN) 1000 MCG tablet Take 1,000 mcg by mouth daily.    . Zinc 50 MG CAPS Take 50 mg by mouth every morning.    Marland Kitchen albuterol (PROVENTIL HFA;VENTOLIN HFA) 108 (90 BASE) MCG/ACT inhaler Inhale 2 puffs into the lungs every 6 (six) hours as needed for wheezing or shortness of breath. Reported on 10/25/2015    . baclofen (LIORESAL) 20 MG tablet Take 1 tablet (20 mg total) by mouth every 8 (eight) hours as needed for muscle spasms. (Patient not taking: Reported on 10/25/2015) 90 tablet 1  . diclofenac (VOLTAREN) 75 MG EC tablet Take 75 mg by mouth 2 (two) times daily as needed. Reported on 10/25/2015    . gabapentin (NEURONTIN) 300 MG capsule Take 1cap at bedtime x7days, then 1cap twice daily x7 days, then 1 cap three times daily (Patient not taking: Reported on 09/10/2015) 90 capsule 0  . pantoprazole (PROTONIX) 40 MG tablet Take 1 tablet (40 mg total) by mouth daily. (Patient not taking: Reported on 09/10/2015) 90 tablet 3  . tiotropium (SPIRIVA) 18 MCG inhalation capsule Place 18 mcg into inhaler and inhale daily. Reported on  10/25/2015     No current facility-administered medications for this visit.    Review of Systems  Constitutional: Negative for fever, chills, weight loss and malaise/fatigue.      Bitter taste in mouth. Loss of appetite. HENT: Negative for congestion, hearing loss, nosebleeds, sore throat and tinnitus.   Eyes: Negative for blurred vision, double vision, pain and discharge.  Respiratory: Positive for shortness of breath. Negative for cough, hemoptysis, sputum production and wheezing.        When he walks he gets short winded.  Cardiovascular: Negative for chest pain, palpitations, claudication, leg swelling and PND.  Gastrointestinal: Negative for heartburn, nausea, vomiting, abdominal pain, diarrhea, constipation, blood in stool and melena.  Genitourinary: Negative for dysuria, urgency, frequency and hematuria.  Musculoskeletal: Positive for back pain. Negative for myalgias, joint pain and falls.       The back pain is sore and burns.  Skin: Negative for itching and rash.  Neurological: Negative for dizziness, tingling, tremors, sensory change, speech change, focal weakness, seizures, loss of consciousness, weakness and headaches.       No seizures since he quit drinking.  Endo/Heme/Allergies: Does not bruise/bleed easily.  Psychiatric/Behavioral: Negative for depression, suicidal ideas, memory loss and substance abuse. The patient is not nervous/anxious and does not have insomnia.    14 point ROS was done and is otherwise as detailed above or in HPI   PHYSICAL EXAMINATION:  ECOG PERFORMANCE STATUS: 0 - Asymptomatic  Filed Vitals:   10/25/15 1100  BP: 120/65  Pulse: 86  Temp: 98 F (36.7 C)  Resp: 18   Filed Weights   10/25/15 1100  Weight: 176 lb (79.833 kg)    Physical Exam  Constitutional: He is oriented to person, place, and time and well-developed, well-nourished, and in no distress. Ambulated to exam table relatively easily and without assistance. HENT:  Head:  Normocephalic and atraumatic.  Nose: Nose normal.  Mouth/Throat: Oropharynx is clear and moist. No oropharyngeal exudate.  Has dentures (for about 5 years). Eyes: Conjunctivae and EOM are normal. Pupils are equal, round, and reactive to light. Right eye exhibits no discharge. Left eye exhibits no discharge. No scleral icterus.  Neck: Normal range of motion. Neck supple. No tracheal deviation present. No thyromegaly present.  Cardiovascular: Normal rate, regular rhythm and normal heart sounds.  Exam reveals no gallop and no friction rub.   No murmur heard. Pulmonary/Chest: Effort normal and breath sounds normal. He has no wheezes. He has no rales.  Abdominal: Soft. Bowel sounds are normal. He exhibits no distension and no mass. There is no tenderness. There is no rebound and no guarding.  Musculoskeletal: Normal range of motion. He exhibits no edema.  Lymphadenopathy:    He has no cervical adenopathy.  Neurological: He is alert and oriented to person, place, and time. He has normal reflexes. No cranial nerve deficit. Gait normal. Coordination normal.  Skin: Skin is warm and dry. No rash noted.  Psychiatric: Mood, memory, affect and judgment normal.  Nursing note and vitals reviewed.   LABORATORY DATA:  I have reviewed the data as listed Lab Results  Component Value Date   WBC 15.7* 10/25/2015   HGB 11.5* 10/25/2015   HCT 35.4* 10/25/2015   MCV 68.2* 10/25/2015   PLT 488* 10/25/2015     ASSESSMENT & PLAN:  Microcytic Anemia, labs on 07/30/2014 with hgb 12, hct 37.3 and mcv 70.4 Mildly low iron saturation at 19% Positive sickle cell screen Colonoscopy 11/12/2014 EGD 11/12/2014 Esophageal dilation 11/12/2014 Serum ferritin of 84NG/ML Borderline low B12 at 205 PG/mL, patient started on sublingual B12 Normal TSH No evidence of an M spike, normal Kappa lambda light chain ratio Normal sedimentation rate   70 year old male with microcytic anemia, positive sickle cell screen, (daughter  with sickle cell anemia and son with sickle cell trait) normal iron studies and GI evaluation.  Unlike sickle cell disease, individuals with sickle cell trait have a normal hemoglobin level, hematocrit, reticulocyte count, red blood cell indices, and peripheral blood smear.   His B12 level is improved. He will continue on sublingual B12. We will notify the patient of his iron levels when they return. His overall laboratory studies are stable. Again  if needed we will pursue a bone marrow biopsy in the future are currently will continue with observation.  I have encouraged him to add a zinc supplement to his diet. There is some data that zinc can help with loss of taste. He also complains of altered taste which I suspect may be medication related.  We will continue with ongoing observation seeing him back in 4 months with repeat labs. As detailed the patient will be notified of iron levels when they are available.  All questions were answered. The patient knows to call the clinic with any problems, questions or concerns.   This document serves as a record of services personally performed by Ancil Linsey, MD. It was created on her behalf by Toni Amend, a trained medical scribe. The creation of this record is based on the scribe's personal observations and the provider's statements to them. This document has been checked and approved by the attending provider.  I have reviewed the above documentation for accuracy and completeness, and I agree with the above.  This note was electronically signed.   Molli Hazard, MD

## 2015-10-27 NOTE — Progress Notes (Signed)
[place] at [facility] Progress Note  Patient Care Team: [name], MD as PCP - General (Nurse Practitioner) Danie Binder, MD as Consulting Physician (Gastroenterology)  CHIEF COMPLAINTS/PURPOSE OF CONSULTATION:  Microcytic Anemia, labs on 07/30/2014 with hgb 12, hct 37.3 and mcv 70.4 Normal iron studies Positive sickle cell screen Normal serum immunofixation Colonoscopy 11/12/2014 EGD 11/12/2014 Esophageal dilation 11/12/2014 Leukocytosis B12 deficiency Erik [name] Myasthenia Gravis       HISTORY OF PRESENTING ILLNESS:  Erik [name] 70 y.o. male is here because of microcytic anemia. He has been evaluated by GI. He is noted to have a positive sickle cell screen. Labs are overall stable. Weight is stable.   He has been diagnosed with Lambert-Eaton Myasthenic Syndrome. Per neurology: IMPRESSION: Lambert-Eaton Myasthenic Syndrome, asymptomatic at this time Low back pain. He does have some mild foraminal stenosis but nothing significant  PLAN: 1. As patient does not exhibit any muscle weakness, would refrain from treatment for LEMS at this time. 2. Paraneoplastic source not found at this time. Will need to continue monitoring along with heme/onc 3. For back pain, will start baclofen and refer to pain management for possible injections. 4. Follow up in 6 months or as needed (particularly if he notes increased muscle weakness, diplopia, dysphagia)  15 minutes spent face to face with patient, over 50% spent discussing diagnosis and management.  Erik Clines, Erik [name]  He underwent CT C/A/P last on 09/18/2015 with results as noted: IMPRESSION: 1. Sub cm nodularity in the prevascular space is unchanged. No dominant mass. 2. Right upper lobe ground-glass nodule has resolved. 3. Coronary artery calcification  Colonoscopy 11/12/2014 was essentially unremarkable. EGD was likewise unremarkable.  He saw Dr. Harl Bowie recently, patient reports he stopped his  florinef and midodrine on his own. He thinks he may need to restart because he is starting to have problems with dizziness again.  He notes that he overall feels the same. His appetite is marginal. Energy is marginal. No major changes since his last visit.   MEDICAL HISTORY:  Past Medical History  Diagnosis Date  . Seizures ([place])     in remote past   . Chronic back pain   . DDD (degenerative disc disease), lumbar   . COPD (chronic obstructive pulmonary disease) ([place])   . Shortness of breath dyspnea   . GERD (gastroesophageal reflux disease)   . Low blood pressure   . Microcytic anemia 06/25/2015  . 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 B12 deficiency 06/25/2015    SURGICAL HISTORY: Past Surgical History  Procedure Laterality Date  . None    . Colonoscopy N/A 11/12/2014    Dr. Clayburn Pert external and internal hemorrhoids, redundant left colon  . Esophagogastroduodenoscopy N/A 11/12/2014    Dr. Barnie Alderman non-erosive gastritis. negative H.pylori   . Esophageal dilation N/A 11/12/2014    Procedure: ESOPHAGEAL DILATION;  Surgeon: Danie Binder, MD;  Location: [facility];  Service: Endoscopy;  Laterality: N/A;    SOCIAL HISTORY: Social History   Social History  . Marital Status: Legally Separated    Spouse Name: N/A  . Number of Children: N/A  . Years of Education: N/A   Occupational History  . Not on file.   Social History Main Topics  . Smoking status: Former Smoker -- 0.50 packs/day for 21 years    Types: Cigarettes    Start date: 01/22/1963    Quit date: 08/11/1983  . Smokeless tobacco: Never Used  . Alcohol Use: No  Comment: remote past, history of ETOH abuse.   . Drug Use: No  . Sexual Activity: Not on file   Other Topics Concern  . Not on file   Social History Narrative   Separated. 2 children. 1 boy and 1 girl. Quit smoking in the 80's. Quit drinking in the 80's. 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   FAMILY HISTORY: Family History  Problem Relation Age of Onset  . Colon cancer Neg Hx   . Stomach cancer Maternal Grandfather   . Sickle cell anemia Daughter    has no family status information on file.   Daughter has sickle cell anemia. Son has sickle cell trait. Mother deceased 3-60 yo, Alzeheimers. Father deceased close to 76 yo. Unsure what he died from. 2 step brothers. 3 sisters. No one has cancer or blood problems that he knows of.  ALLERGIES:  has No Known Allergies.  MEDICATIONS:  Current Outpatient Prescriptions  Medication Sig Dispense Refill  . aspirin EC 81 MG tablet Take 81 mg by mouth daily.    . vitamin B-12 (CYANOCOBALAMIN) 1000 MCG tablet Take 1,000 mcg by mouth daily.    . Zinc 50 MG CAPS Take 50 mg by mouth every morning.    Marland Kitchen albuterol (PROVENTIL HFA;VENTOLIN HFA) 108 (90 BASE) MCG/ACT inhaler Inhale 2 puffs into the lungs every 6 (six) hours as needed for wheezing or shortness of breath. Reported on 10/25/2015    . baclofen (LIORESAL) 20 MG tablet Take 1 tablet (20 mg total) by mouth every 8 (eight) hours as needed for muscle spasms. (Patient not taking: Reported on 10/25/2015) 90 tablet 1  . diclofenac (VOLTAREN) 75 MG EC tablet Take 75 mg by mouth 2 (two) times daily as needed. Reported on 10/25/2015    . gabapentin (NEURONTIN) 300 MG capsule Take 1cap at bedtime x7days, then 1cap twice daily x7 days, then 1 cap three times daily (Patient not taking: Reported on 09/10/2015) 90 capsule 0  . pantoprazole (PROTONIX) 40 MG tablet Take 1 tablet (40 mg total) by mouth daily. (Patient not taking: Reported on 09/10/2015) 90 tablet 3  . tiotropium (SPIRIVA) 18 MCG inhalation capsule Place 18 mcg into inhaler and inhale daily. Reported on 10/25/2015     No current facility-administered medications for this visit.    Review of Systems  Constitutional: Negative for fever, chills, weight loss and malaise/fatigue.  HENT: Negative for congestion, hearing loss,  nosebleeds, sore throat and tinnitus.   Eyes: Negative for blurred vision, double vision, pain and discharge.  Respiratory: Positive for shortness of breath. Negative for cough, hemoptysis, sputum production and wheezing.        When he walks he gets short winded.  Cardiovascular: Negative for chest pain, palpitations, claudication, leg swelling and PND.  Gastrointestinal: Negative for heartburn, nausea, vomiting, abdominal pain, diarrhea, constipation, blood in stool and melena.  Genitourinary: Negative for dysuria, urgency, frequency and hematuria.  Musculoskeletal: Positive for back pain. Negative for myalgias, joint pain and falls.       The back pain is sore and burns.  Skin: Negative for itching and rash.  Neurological: Negative for dizziness, tingling, tremors, sensory change, speech change, focal weakness, seizures, loss of consciousness, weakness and headaches.  Endo/Heme/Allergies: Does not bruise/bleed easily.  Psychiatric/Behavioral: Negative for depression, suicidal ideas, memory loss and substance abuse. The patient is not nervous/anxious and does not have insomnia.    14 point ROS was done and is otherwise as detailed above or in HPI  PHYSICAL EXAMINATION:  ECOG PERFORMANCE STATUS: 0 - Asymptomatic  Filed Vitals:   10/25/15 1100  BP: 120/65  Pulse: 86  Temp: 98 F (36.7 C)  Resp: 18   Filed Weights   10/25/15 1100  Weight: 176 lb (79.833 kg)    Physical Exam  Constitutional: He is oriented to person, place, and time and well-developed, well-nourished, and in no distress. Ambulated to exam table relatively easily and without assistance. HENT:  Head: Normocephalic and atraumatic.  Nose: Nose normal.  Mouth/Throat: Oropharynx is clear and moist. No oropharyngeal exudate.  Has dentures (for about 5 years). Eyes: Conjunctivae and EOM are normal. Pupils are equal, round, and reactive to light. Right eye exhibits no discharge. Left eye exhibits no discharge. No  scleral icterus.  Neck: Normal range of motion. Neck supple. No tracheal deviation present. No thyromegaly present.  Cardiovascular: Normal rate, regular rhythm and normal heart sounds.  Exam reveals no gallop and no friction rub.   No murmur heard. Pulmonary/Chest: Effort normal and breath sounds normal. He has no wheezes. He has no rales.  Abdominal: Soft. Bowel sounds are normal. He exhibits no distension and no mass. There is no tenderness. There is no rebound and no guarding.  Musculoskeletal: Normal range of motion. He exhibits no edema.  Lymphadenopathy:    He has no cervical adenopathy.  Neurological: He is alert and oriented to person, place, and time. He has normal reflexes. No cranial nerve deficit. Gait normal. Coordination normal.  Skin: Skin is warm and dry. No rash noted.  Psychiatric: Mood, memory, affect and judgment normal.  Nursing note and vitals reviewed.   LABORATORY DATA:  I have reviewed the data as listed Lab Results  Component Value Date   WBC 15.7* 10/25/2015   HGB 11.5* 10/25/2015   HCT 35.4* 10/25/2015   MCV 68.2* 10/25/2015   PLT 488* 10/25/2015     ASSESSMENT & PLAN:  Microcytic Anemia, labs on 07/30/2014 with hgb 12, hct 37.3 and mcv 70.4 Mildly low iron saturation at 19% Positive sickle cell screen Colonoscopy 11/12/2014 EGD 11/12/2014 Esophageal dilation 11/12/2014 Serum ferritin of 84NG/ML Borderline low B12 at 205 PG/mL, patient started on sublingual B12 good response Normal TSH No evidence of an M spike, normal Kappa lambda light chain ratio Normal sedimentation rate Leukocytosis Normal Flow Cytometry Lambert-Eaton Myasthenia Gravis Syndrome  70 year old male with microcytic anemia, positive sickle cell screen, (daughter with sickle cell anemia and son with sickle cell trait) normal iron studies and GI evaluation.  Unlike sickle cell disease, individuals with sickle cell trait have a normal hemoglobin level, hematocrit, reticulocyte count,  red blood cell indices, and peripheral blood smear.   His B12 level is improved. He will continue on sublingual B12.  His overall laboratory studies are stable. He has had flow cytometry for his leukocytosis which is a predominant neutrophilia. This is more recent. If persistent at follow-up I would consider a BCR-ABL, note that the patient has never had a BMBX.    We will continue with ongoing observation seeing him back in 4 months with repeat labs. As detailed the patient will be notified of iron levels when they are available.  All questions were answered. The patient knows to call the clinic with any problems, questions or concerns.   This document serves as a record of services personally performed by Erik Linsey, MD. It was created on her behalf by   This note was electronically signed.   Erik Hazard, MD

## 2015-10-28 ENCOUNTER — Emergency Department (HOSPITAL_COMMUNITY)
Admission: EM | Admit: 2015-10-28 | Discharge: 2015-10-28 | Disposition: A | Discharge status: Home / Self Care | Payer: Medicare Other | Attending: Emergency Medicine | Admitting: Emergency Medicine

## 2015-10-28 ENCOUNTER — Encounter (HOSPITAL_COMMUNITY): Payer: Self-pay | Admitting: Emergency Medicine

## 2015-10-28 DIAGNOSIS — M79604 Pain in right leg: Secondary | ICD-10-CM | POA: Insufficient documentation

## 2015-10-28 DIAGNOSIS — J449 Chronic obstructive pulmonary disease, unspecified: Secondary | ICD-10-CM | POA: Insufficient documentation

## 2015-10-28 DIAGNOSIS — G8929 Other chronic pain: Secondary | ICD-10-CM | POA: Diagnosis not present

## 2015-10-28 DIAGNOSIS — Z862 Personal history of diseases of the blood and blood-forming organs and certain disorders involving the immune mechanism: Secondary | ICD-10-CM | POA: Insufficient documentation

## 2015-10-28 DIAGNOSIS — R531 Weakness: Secondary | ICD-10-CM | POA: Diagnosis not present

## 2015-10-28 DIAGNOSIS — E538 Deficiency of other specified B group vitamins: Secondary | ICD-10-CM | POA: Insufficient documentation

## 2015-10-28 DIAGNOSIS — Z87891 Personal history of nicotine dependence: Secondary | ICD-10-CM | POA: Insufficient documentation

## 2015-10-28 DIAGNOSIS — Z7982 Long term (current) use of aspirin: Secondary | ICD-10-CM | POA: Insufficient documentation

## 2015-10-28 DIAGNOSIS — M545 Low back pain: Secondary | ICD-10-CM | POA: Insufficient documentation

## 2015-10-28 DIAGNOSIS — K219 Gastro-esophageal reflux disease without esophagitis: Secondary | ICD-10-CM | POA: Diagnosis not present

## 2015-10-28 DIAGNOSIS — Z79899 Other long term (current) drug therapy: Secondary | ICD-10-CM | POA: Insufficient documentation

## 2015-10-28 DIAGNOSIS — M79605 Pain in left leg: Secondary | ICD-10-CM | POA: Diagnosis not present

## 2015-10-28 MED ORDER — GABAPENTIN 100 MG PO CAPS: 100.0000 mg | ORAL_CAPSULE | Freq: Three times a day (TID) | ORAL | Status: DC | PRN

## 2015-10-28 NOTE — ED Provider Notes (Signed)
Medical screening examination/treatment/procedure(s) were conducted as a shared visit with non-physician practitioner(s) and myself.  I personally evaluated the patient during the encounter.   EKG Interpretation None       See the written copy of this report in the patient's paper medical record.  These results did not interface directly into the electronic medical record and are summarized here.  70 yo M with a chief complaints of low back pain. This is a chronic issue for this patient. Patient has had an MRI as well as has a appointment with pain management upcoming. Denies any new injuries. Pulse motor and sensation is intact distally. Denies cauda equina symptoms. Discharge home. Pain management follow-up.  Deno Etienne, DO 10/28/15 2059

## 2015-10-28 NOTE — Discharge Instructions (Signed)
Read the information below.  Use the prescribed medication as directed.  Please discuss all new medications with your pharmacist.  You may return to the Emergency Department at any time for worsening condition or any new symptoms that concern you.   Please continue to take the baclofen as prescribed and follow up with your pain management doctor (when your appointment is scheduled) and your primary care provider in the meantime.  If you develop fevers, loss of control of bowel or bladder, weakness or numbness in your legs, or are unable to walk, return to the ER for a recheck.    Back Pain, Adult Back pain is very common. The pain often gets better over time. The cause of back pain is usually not dangerous. Most people can learn to manage their back pain on their own.  HOME CARE  Watch your back pain for any changes. The following actions may help to lessen any pain you are feeling:  Stay active. Start with short walks on flat ground if you can. Try to walk farther each day.  Exercise regularly as told by your doctor. Exercise helps your back heal faster. It also helps avoid future injury by keeping your muscles strong and flexible.  Do not sit, drive, or stand in one place for more than 30 minutes.  Do not stay in bed. Resting more than 1-2 days can slow down your recovery.  Be careful when you bend or lift an object. Use good form when lifting:  Bend at your knees.  Keep the object close to your body.  Do not twist.  Sleep on a firm mattress. Lie on your side, and bend your knees. If you lie on your back, put a pillow under your knees.  Take medicines only as told by your doctor.  Put ice on the injured area.  Put ice in a plastic bag.  Place a towel between your skin and the bag.  Leave the ice on for 20 minutes, 2-3 times a day for the first 2-3 days. After that, you can switch between ice and heat packs.  Avoid feeling anxious or stressed. Find good ways to deal with stress,  such as exercise.  Maintain a healthy weight. Extra weight puts stress on your back. GET HELP IF:   You have pain that does not go away with rest or medicine.  You have worsening pain that goes down into your legs or buttocks.  You have pain that does not get better in one week.  You have pain at night.  You lose weight.  You have a fever or chills. GET HELP RIGHT AWAY IF:   You cannot control when you poop (bowel movement) or pee (urinate).  Your arms or legs feel weak.  Your arms or legs lose feeling (numbness).  You feel sick to your stomach (nauseous) or throw up (vomit).  You have belly (abdominal) pain.  You feel like you may pass out (faint).   This information is not intended to replace advice given to you by your health care provider. Make sure you discuss any questions you have with your heal Radicular Pain Radicular pain in either the arm or leg is usually from a bulging or herniated disk in the spine. A piece of the herniated disk may press against the nerves as the nerves exit the spine. This causes pain which is felt at the tips of the nerves down the arm or leg. Other causes of radicular pain may include:  Fractures.  Heart disease.  Cancer.  An abnormal and usually degenerative state of the nervous system or nerves (neuropathy). Diagnosis may require CT or MRI scanning to determine the primary cause.  Nerves that start at the neck (nerve roots) may cause radicular pain in the outer shoulder and arm. It can spread down to the thumb and fingers. The symptoms vary depending on which nerve root has been affected. In most cases radicular pain improves with conservative treatment. Neck problems may require physical therapy, a neck collar, or cervical traction. Treatment may take many weeks, and surgery may be considered if the symptoms do not improve.  Conservative treatment is also recommended for sciatica. Sciatica causes pain to radiate from the lower back or  buttock area down the leg into the foot. Often there is a history of back problems. Most patients with sciatica are better after 2 to 4 weeks of rest and other supportive care. Short term bed rest can reduce the disk pressure considerably. Sitting, however, is not a good position since this increases the pressure on the disk. You should avoid bending, lifting, and all other activities which make the problem worse. Traction can be used in severe cases. Surgery is usually reserved for patients who do not improve within the first months of treatment. Only take over-the-counter or prescription medicines for pain, discomfort, or fever as directed by your caregiver. Narcotics and muscle relaxants may help by relieving more severe pain and spasm and by providing mild sedation. Cold or massage can give significant relief. Spinal manipulation is not recommended. It can increase the degree of disc protrusion. Epidural steroid injections are often effective treatment for radicular pain. These injections deliver medicine to the spinal nerve in the space between the protective covering of the spinal cord and back bones (vertebrae). Your caregiver can give you more information about steroid injections. These injections are most effective when given within two weeks of the onset of pain.  You should see your caregiver for follow up care as recommended. A program for neck and back injury rehabilitation with stretching and strengthening exercises is an important part of management.  SEEK IMMEDIATE MEDICAL CARE IF:  You develop increased pain, weakness, or numbness in your arm or leg.  You develop difficulty with bladder or bowel control.  You develop abdominal pain.   This information is not intended to replace advice given to you by your health care provider. Make sure you discuss any questions you have with your health care provider.   Document Released: 09/03/2004 Document Revised: 08/17/2014 Document Reviewed:  02/20/2015 Elsevier Interactive Patient Education [employer]. th care provider.   Document Released: 01/13/2008 Document Revised: 08/17/2014 Document Reviewed: 11/28/2013 Elsevier Interactive Patient Education [employer].

## 2015-10-28 NOTE — ED Provider Notes (Signed)
CSN: [id number]     Arrival date & time 10/28/15  1325 History  By signing my name below, I, Erik [name], attest that this documentation has been prepared under the direction and in the presence of Erik Sutcliffe, PA-C. Electronically Signed: [name], ED Scribe. 10/28/2015. 3:16 PM.    Chief Complaint  Patient presents with  . Back Pain  . Leg Pain   The history is provided by the patient. No language interpreter was used.  HPI Comments: Erik [name] is a 70 y.o. male with a hx of chronic back pain and LEMS who presents to the Emergency Department complaining of gradually worsening moderate sore lower back pain that radiates down to bilateral feet onset several months ago. He reports associated weakness in bilateral legs and pain with ROM and ambulating.  The pain is worse with any movement.  Pt ambulates with a cane at baseline. He explains that this back pain has been ongoing for several years but has become gradually worse within the last few months. He denies that he has been to [place] pain management - per notes the referral is in process. No recent falls, injuries, or trauma. He denies any numbness, tingling, urinary symptoms, bowel/bladder incontinence, or n/v.   PCP: 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 On 10/09/15, pt called and said that the Baclofen was not working, medication was increased to TID.    Past Medical History  Diagnosis Date  . Seizures ([place])     in remote past   . Chronic back pain   . DDD (degenerative disc disease), lumbar   . COPD (chronic obstructive pulmonary disease) ([place])   . Shortness of breath dyspnea   . GERD (gastroesophageal reflux disease)   . Low blood pressure   . Microcytic  anemia 06/25/2015  . 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 B12 deficiency 06/25/2015   Past Surgical History  Procedure Laterality Date  . None    . Colonoscopy N/A 11/12/2014    Dr. Clayburn Pert external and internal hemorrhoids, redundant left colon  . Esophagogastroduodenoscopy N/A 11/12/2014    Dr. Barnie Alderman non-erosive gastritis. negative H.pylori   . Esophageal dilation N/A 11/12/2014    Procedure: ESOPHAGEAL DILATION;  Surgeon: Danie Binder, MD;  Location[facility];  Service: Endoscopy;  Laterality: N/A;   Family History  Problem Relation Age of Onset  . Colon cancer Neg Hx   . Stomach cancer Maternal Grandfather   . Sickle cell anemia Daughter    Social History  Substance Use Topics  . Smoking status: Former Smoker -- 0.50 packs/day for 21 years    Types: Cigarettes    Start date: 01/22/1963    Quit date: 08/11/1983  . Smokeless tobacco: Never Used  . Alcohol Use: No     Comment: remote past, history of ETOH abuse.     Review of Systems  Constitutional: Negative for fever and chills.  Gastrointestinal: Negative for nausea, vomiting and abdominal pain.  Genitourinary: Negative for dysuria, frequency and hematuria.  Musculoskeletal: Positive for back pain (lower).  Skin: Negative for color change.  Neurological: Positive for weakness. Negative for numbness.  Psychiatric/Behavioral: Negative for self-injury.      Allergies  Review of patient's allergies  indicates no known allergies.  Home Medications   Prior to Admission medications   Medication Sig Start Date End Date Taking? Authorizing Provider  albuterol (PROVENTIL HFA;VENTOLIN HFA) 108 (90 BASE) MCG/ACT inhaler Inhale 2 puffs into the lungs every 6 (six) hours as needed for wheezing or shortness of breath. Reported on 10/25/2015    Historical Provider, MD  aspirin EC 81 MG tablet Take 81 mg by mouth daily.    Historical Provider, MD  baclofen (LIORESAL) 20 MG tablet Take 1 tablet  (20 mg total) by mouth every 8 (eight) hours as needed for muscle spasms. Patient not taking: Reported on 10/25/2015 10/10/15   [name], DO  diclofenac (VOLTAREN) 75 MG EC tablet Take 75 mg by mouth 2 (two) times daily as needed. Reported on 10/25/2015    Historical Provider, MD  gabapentin (NEURONTIN) 300 MG capsule Take 1cap at bedtime x7days, then 1cap twice daily x7 days, then 1 cap three times daily Patient not taking: Reported on 09/10/2015 05/27/15   [name], DO  pantoprazole (PROTONIX) 40 MG tablet Take 1 tablet (40 mg total) by mouth daily. Patient not taking: Reported on 09/10/2015 04/18/15   [name], NP  tiotropium (SPIRIVA) 18 MCG inhalation capsule Place 18 mcg into inhaler and inhale daily. Reported on 10/25/2015    Historical Provider, MD  vitamin B-12 (CYANOCOBALAMIN) 1000 MCG tablet Take 1,000 mcg by mouth daily.    Historical Provider, MD  Zinc 50 MG CAPS Take 50 mg by mouth every morning.    Historical Provider, MD   BP 113/72 mmHg  Pulse 88  Temp(Src) 99 F (37.2 C) (Oral)  Resp 18  SpO2 96% Physical Exam  Constitutional: He appears well-developed and well-nourished. No distress.  HENT:  Head: Normocephalic and atraumatic.  Neck: Neck supple.  Cardiovascular: Normal rate, regular rhythm and intact distal pulses.   Pulmonary/Chest: Effort normal and breath sounds normal. No respiratory distress. He has no wheezes. He has no rales.  Abdominal: Soft. He exhibits no distension. There is no tenderness. There is no rebound and no guarding.  Musculoskeletal: He exhibits no edema.  DP, PT pulses intact bilaterally.  Pt is able to lift bilateral legs off bed and hold them up. Plantar and dorsiflexion is 5/5, symmetric.   Sensation intact throughout.   Neurological: He is alert.  Skin: He is not diaphoretic.  Nursing note and vitals reviewed.   ED Course  Procedures (including critical care time) DIAGNOSTIC STUDIES: Oxygen Saturation is 96% on RA, normal by my  interpretation.    COORDINATION OF CARE: 3:12 PM- Pt advised of plan for treatment and pt agrees. Will consult with Attending.    Labs Review Labs Reviewed - No data to display  Imaging Review No results found.   Clayton Bibles, PA-C has personally reviewed and evaluated these images and lab results as part of her medical decision-making.  MDM   Final diagnoses:  Bilateral low back pain, with sciatica presence unspecified  Bilateral leg pain    Afebrile, nontoxic patient with exacerbation of chronic back pain.  No red flags with history or exam.  Neurovascularly intact.  Emergent imaging not indicated at this time.  Pt also seen and examined by Dr Tyrone Nine.    D/C home with gabapentin, continued care per neurology, pain management follow up once initial appointment scheduled, also PCP follow up.   Discussed result, findings, treatment, and follow up  with patient.  Pt given return precautions.  Pt verbalizes understanding  and agrees with plan.          I personally performed the services described in this documentation, which was scribed in my presence. The recorded information has been reviewed and is accurate.   Clayton Bibles, PA-C 10/28/15 [place], DO 10/28/15 2059

## 2015-10-28 NOTE — ED Notes (Signed)
Pt sts lower back pain with bilateral leg pain x several days; pt sts seen at [facility] for same on Friday

## 2015-11-08 ENCOUNTER — Telehealth: Payer: Self-pay | Admitting: Neurology

## 2015-11-08 ENCOUNTER — Telehealth: Payer: Self-pay | Admitting: *Deleted

## 2015-11-08 MED ORDER — MIDODRINE HCL 5 MG PO TABS: 5.0000 mg | ORAL_TABLET | Freq: Three times a day (TID) | ORAL | Status: DC

## 2015-11-08 NOTE — Telephone Encounter (Signed)
Can start midodrine '5mg'$  tid. Call us back next week with update on symptoms. Continue heavy fluid and salt intake.   Zandra Abts MD

## 2015-11-08 NOTE — Telephone Encounter (Signed)
Pt daughter aware, medication sent to pharmacy. Will f/u next week

## 2015-11-08 NOTE — Telephone Encounter (Signed)
I have not prescribed his blood pressure medication.  I suggest she address this with his PCP

## 2015-11-08 NOTE — Telephone Encounter (Signed)
Pt daughter called regarding dizziness for weeks now since stopping midodrine. Says he has to lay down most of the time because of severe dizziness. Daughter thinks pt needs to start midodrine again will forward to Dr. Harl Bowie

## 2015-11-08 NOTE — Telephone Encounter (Signed)
Daughter called for BP medication. I see no BP medication on current or historic medication list. Never prescribed by [place].

## 2015-11-08 NOTE — Telephone Encounter (Signed)
Pt daughter alfreda need to have a refill called in for her dad blood pressure medication she was not sure of the name of the medication called in to [facility] in [place]  Pt daughter phone number is [phone]

## 2015-11-15 ENCOUNTER — Encounter (HOSPITAL_COMMUNITY): Payer: Medicare Other | Attending: Hematology & Oncology

## 2015-11-15 ENCOUNTER — Encounter (HOSPITAL_COMMUNITY): Payer: Self-pay

## 2015-11-15 ENCOUNTER — Emergency Department (HOSPITAL_COMMUNITY): Payer: Medicare Other

## 2015-11-15 ENCOUNTER — Other Ambulatory Visit: Payer: Self-pay

## 2015-11-15 ENCOUNTER — Emergency Department (HOSPITAL_COMMUNITY)
Admission: EM | Admit: 2015-11-15 | Discharge: 2015-11-15 | Disposition: A | Discharge status: Home / Self Care | Payer: Medicare Other | Attending: Emergency Medicine | Admitting: Emergency Medicine

## 2015-11-15 DIAGNOSIS — Z79899 Other long term (current) drug therapy: Secondary | ICD-10-CM | POA: Insufficient documentation

## 2015-11-15 DIAGNOSIS — J189 Pneumonia, unspecified organism: Secondary | ICD-10-CM | POA: Insufficient documentation

## 2015-11-15 DIAGNOSIS — R634 Abnormal weight loss: Secondary | ICD-10-CM | POA: Diagnosis not present

## 2015-11-15 DIAGNOSIS — R52 Pain, unspecified: Secondary | ICD-10-CM | POA: Diagnosis present

## 2015-11-15 DIAGNOSIS — D649 Anemia, unspecified: Secondary | ICD-10-CM | POA: Diagnosis not present

## 2015-11-15 DIAGNOSIS — R531 Weakness: Secondary | ICD-10-CM | POA: Diagnosis not present

## 2015-11-15 DIAGNOSIS — Z87891 Personal history of nicotine dependence: Secondary | ICD-10-CM | POA: Diagnosis not present

## 2015-11-15 DIAGNOSIS — Z7982 Long term (current) use of aspirin: Secondary | ICD-10-CM | POA: Diagnosis not present

## 2015-11-15 DIAGNOSIS — J449 Chronic obstructive pulmonary disease, unspecified: Secondary | ICD-10-CM | POA: Insufficient documentation

## 2015-11-15 DIAGNOSIS — D509 Iron deficiency anemia, unspecified: Secondary | ICD-10-CM

## 2015-11-15 LAB — CBC WITH DIFFERENTIAL/PLATELET
BASOS PCT: 1 %
Basophils Absolute: 0.1 10*3/uL (ref 0.0–0.1)
Eosinophils Absolute: 0.4 10*3/uL (ref 0.0–0.7)
Eosinophils Relative: 3 %
HEMATOCRIT: 37.2 % — AB (ref 39.0–52.0)
HEMOGLOBIN: 12.2 g/dL — AB (ref 13.0–17.0)
LYMPHS ABS: 2.2 10*3/uL (ref 0.7–4.0)
LYMPHS PCT: 16 %
MCH: 22.3 pg — AB (ref 26.0–34.0)
MCHC: 32.8 g/dL (ref 30.0–36.0)
MCV: 68.1 fL — AB (ref 78.0–100.0)
MONO ABS: 1 10*3/uL (ref 0.1–1.0)
MONOS PCT: 7 %
NEUTROS ABS: 10 10*3/uL — AB (ref 1.7–7.7)
NEUTROS PCT: 74 %
Platelets: 382 10*3/uL (ref 150–400)
RBC: 5.46 MIL/uL (ref 4.22–5.81)
RDW: 14.9 % (ref 11.5–15.5)
WBC: 13.6 10*3/uL — ABNORMAL HIGH (ref 4.0–10.5)

## 2015-11-15 LAB — CBC
HCT: 38.2 % — ABNORMAL LOW (ref 39.0–52.0)
HEMOGLOBIN: 12.5 g/dL — AB (ref 13.0–17.0)
MCH: 22.2 pg — AB (ref 26.0–34.0)
MCHC: 32.7 g/dL (ref 30.0–36.0)
MCV: 68 fL — ABNORMAL LOW (ref 78.0–100.0)
PLATELETS: 389 10*3/uL (ref 150–400)
RBC: 5.62 MIL/uL (ref 4.22–5.81)
RDW: 14.8 % (ref 11.5–15.5)
WBC: 13.6 10*3/uL — ABNORMAL HIGH (ref 4.0–10.5)

## 2015-11-15 LAB — URINALYSIS, ROUTINE W REFLEX MICROSCOPIC
BILIRUBIN URINE: NEGATIVE
Glucose, UA: NEGATIVE mg/dL
HGB URINE DIPSTICK: NEGATIVE
KETONES UR: NEGATIVE mg/dL
Leukocytes, UA: NEGATIVE
NITRITE: NEGATIVE
PROTEIN: NEGATIVE mg/dL
pH: 6 (ref 5.0–8.0)

## 2015-11-15 LAB — BASIC METABOLIC PANEL
Anion gap: 12 (ref 5–15)
BUN: 19 mg/dL (ref 6–20)
CHLORIDE: 98 mmol/L — AB (ref 101–111)
CO2: 28 mmol/L (ref 22–32)
CREATININE: 1.12 mg/dL (ref 0.61–1.24)
Calcium: 9.4 mg/dL (ref 8.9–10.3)
GFR calc non Af Amer: >60 mL/min (ref >60)
Glucose, Bld: 99 mg/dL (ref 65–99)
POTASSIUM: 4.3 mmol/L (ref 3.5–5.1)
SODIUM: 138 mmol/L (ref 135–145)

## 2015-11-15 LAB — CBG MONITORING, ED: GLUCOSE-CAPILLARY: 73 mg/dL (ref 65–99)

## 2015-11-15 MED ORDER — DEXTROSE 5 % IV SOLN
1.0000 g | Freq: Once | INTRAVENOUS | Status: AC
  Administered 2015-11-15: 1 g via INTRAVENOUS
  Filled 2015-11-15: qty 10

## 2015-11-15 MED ORDER — DEXTROSE 5 % IV SOLN
500.0000 mg | INTRAVENOUS | Status: DC
  Administered 2015-11-15: 500 mg via INTRAVENOUS
  Filled 2015-11-15: qty 500

## 2015-11-15 MED ORDER — ACETAMINOPHEN 500 MG PO TABS
1000.0000 mg | ORAL_TABLET | Freq: Once | ORAL | Status: AC
  Administered 2015-11-15: 1000 mg via ORAL
  Filled 2015-11-15: qty 2

## 2015-11-15 MED ORDER — SODIUM CHLORIDE 0.9 % IV BOLUS (SEPSIS)
1000.0000 mL | Freq: Once | INTRAVENOUS | Status: AC
  Administered 2015-11-15: 1000 mL via INTRAVENOUS

## 2015-11-15 MED ORDER — AZITHROMYCIN 250 MG PO TABS: ORAL_TABLET | ORAL | Status: DC

## 2015-11-15 NOTE — ED Notes (Signed)
Patient given Ginger ale per request.

## 2015-11-15 NOTE — Discharge Instructions (Signed)
Community-Acquired Pneumonia, Adult Pneumonia is an infection of the lungs. One type of pneumonia can happen while a person is in a hospital. A different type can happen when a person is not in a hospital (community-acquired pneumonia). It is easy for this kind to spread from person to person. It can spread to you if you breathe near an infected person who coughs or sneezes. Some symptoms include:  A dry cough.  A wet (productive) cough.  Fever.  Sweating.  Chest pain. HOME CARE  Take over-the-counter and prescription medicines only as told by your doctor.  Only take cough medicine if you are losing sleep.  If you were prescribed an antibiotic medicine, take it as told by your doctor. Do not stop taking the antibiotic even if you start to feel better.  Sleep with your head and neck raised (elevated). You can do this by putting a few pillows under your head, or you can sleep in a recliner.  Do not use tobacco products. These include cigarettes, chewing tobacco, and e-cigarettes. If you need help quitting, ask your doctor.  Drink enough water to keep your pee (urine) clear or pale yellow. A shot (vaccine) can help prevent pneumonia. Shots are often suggested for:  People older than 71 years of age.  People older than 71 years of age:  Who are having cancer treatment.  Who have long-term (chronic) lung disease.  Who have problems with their body's defense system (immune system). You may also prevent pneumonia if you take these actions:  Get the flu (influenza) shot every year.  Go to the dentist as often as told.  Wash your hands often. If soap and water are not available, use hand sanitizer. GET HELP IF:  You have a fever.  You lose sleep because your cough medicine does not help. GET HELP RIGHT AWAY IF:  You are short of breath and it gets worse.  You have more chest pain.  Your sickness gets worse. This is very serious if:  You are an older adult.  Your  body's defense system is weak.  You cough up blood.   This information is not intended to replace advice given to you by your health care provider. Make sure you discuss any questions you have with your health care provider.   Document Released: 01/13/2008 Document Revised: 04/17/2015 Document Reviewed: 11/21/2014 Elsevier Interactive Patient Education 2016 [place] have pneumonia in your right lung. Increase fluids. Tylenol for fever and achiness. Start antibiotic tomorrow at dinnertime. Return if worse.

## 2015-11-15 NOTE — ED Notes (Signed)
Patient reports of body aches, chills and dizziness x2 days. States he is unable to ambulate.

## 2015-11-15 NOTE — ED Provider Notes (Signed)
CSN: [id number]     Arrival date & time 11/15/15  1206 History   First MD Initiated Contact with Patient 11/15/15 1242     Chief Complaint  Patient presents with  . Generalized Body Aches  . Dizziness     (Consider location/radiation/quality/duration/timing/severity/associated sxs/prior Treatment) HPI..... Weakness, achiness, chills for several days. No fever, sweats, rusty sputum. Severity of symptoms moderate. He has Lambert-Eaton myasthenic syndrome.  He has been drinking fluids.  Past Medical History  Diagnosis Date  . Seizures ([place])     in remote past   . Chronic back pain   . DDD (degenerative disc disease), lumbar   . COPD (chronic obstructive pulmonary disease) ([place])   . Shortness of breath dyspnea   . GERD (gastroesophageal reflux disease)   . Low blood pressure   . Microcytic anemia 06/25/2015  . 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 B12 deficiency 06/25/2015   Past Surgical History  Procedure Laterality Date  . None    . Colonoscopy N/A 11/12/2014    Dr. Clayburn Pert external and internal hemorrhoids, redundant left colon  . Esophagogastroduodenoscopy N/A 11/12/2014    Dr. Barnie Alderman non-erosive gastritis. negative H.pylori   . Esophageal dilation N/A 11/12/2014    Procedure: ESOPHAGEAL DILATION;  Surgeon: Danie Binder, MD;  Location[facility];  Service: Endoscopy;  Laterality: N/A;   Family History  Problem Relation Age of Onset  . Colon cancer Neg Hx   . Stomach cancer Maternal Grandfather   . Sickle cell anemia Daughter    Social History  Substance Use Topics  . Smoking status: Former Smoker -- 0.50 packs/day for 21 years    Types: Cigarettes    Start date: 01/22/1963    Quit date: 08/11/1983  . Smokeless tobacco: Never Used  . Alcohol Use: No     Comment: remote past, history of ETOH abuse.     Review of Systems  All other systems reviewed and are negative.     Allergies  Review of patient's allergies indicates no  known allergies.  Home Medications   Prior to Admission medications   Medication Sig Start Date End Date Taking? Authorizing Provider  albuterol (PROVENTIL HFA;VENTOLIN HFA) 108 (90 BASE) MCG/ACT inhaler Inhale 2 puffs into the lungs every 6 (six) hours as needed for wheezing or shortness of breath. Reported on 10/25/2015   Yes Historical Provider, MD  aspirin EC 81 MG tablet Take 81 mg by mouth daily.   Yes Historical Provider, MD  gabapentin (NEURONTIN) 100 MG capsule Take 1 capsule (100 mg total) by mouth 3 (three) times daily as needed (pain). 10/28/15  Yes Clayton Bibles, PA-C  midodrine (PROAMATINE) 5 MG tablet Take 1 tablet (5 mg total) by mouth 3 (three) times daily with meals. 11/08/15  Yes Arnoldo Lenis, MD  vitamin B-12 (CYANOCOBALAMIN) 1000 MCG tablet Take 1,000 mcg by mouth daily.   Yes Historical Provider, MD  Zinc 50 MG CAPS Take 50 mg by mouth every morning.   Yes Historical Provider, MD  azithromycin (ZITHROMAX) 250 MG tablet As directed 11/15/15   Nat Christen, MD   BP 106/69 mmHg  Pulse 69  Temp(Src) 98.5 F (36.9 C) (Oral)  Resp 12  Ht '6\' 3"'$  (1.905 m)  Wt 175 lb (79.379 kg)  BMI 21.87 kg/m2  SpO2 97% Physical Exam  Constitutional: He is oriented to person, place, and time.  Slightly pale but nontoxic-appearing  HENT:  Head: Normocephalic and atraumatic.  Eyes: Conjunctivae and EOM are normal.  Pupils are equal, round, and reactive to light.  Neck: Normal range of motion. Neck supple.  Cardiovascular: Normal rate and regular rhythm.   Pulmonary/Chest: Effort normal and breath sounds normal.  No respiratory distress.  Abdominal: Soft. Bowel sounds are normal.  Musculoskeletal: Normal range of motion.  Neurological: He is alert and oriented to person, place, and time.  Skin: Skin is warm and dry.  Psychiatric: He has a normal mood and affect. His behavior is normal.  Nursing note and vitals reviewed.   ED Course  Procedures (including critical care time) Labs  Review Labs Reviewed  BASIC METABOLIC PANEL - Abnormal; Notable for the following:    Chloride 98 (*)    All other components within normal limits  CBC - Abnormal; Notable for the following:    WBC 13.6 (*)    Hemoglobin 12.5 (*)    HCT 38.2 (*)    MCV 68.0 (*)    MCH 22.2 (*)    All other components within normal limits  URINALYSIS, ROUTINE W REFLEX MICROSCOPIC (NOT AT [facility]) - Abnormal; Notable for the following:    Specific Gravity, Urine <1.005 (*)    All other components within normal limits  CBG MONITORING, ED    Imaging Review Dg Chest 2 View  11/15/2015  CLINICAL DATA:  Weakness EXAM: CHEST  2 VIEW COMPARISON:  July 30, 2014 chest x-ray and September 18, 2015 CT scan FINDINGS: There is a new right perihilar infiltrate. No pulmonary nodules or masses. No other infiltrate identified. The heart, hila, mediastinum, lungs, and pleura are otherwise normal. No pneumothorax identified. IMPRESSION: There is a new infiltrate in the superior segment of the right lower lobe, likely pneumonia. This was not present on a recent CT scan. Recommend follow-up to complete resolution. Electronically Signed   By: Dorise Bullion III M.D   On: 11/15/2015 13:58   I have personally reviewed and evaluated these images and lab results as part of my medical decision-making.   EKG Interpretation None      Date: 11/15/2015  Rate: 85  Rhythm: normal sinus rhythm  QRS Axis: normal  Intervals: normal  ST/T Wave abnormalities: normal  Conduction Disutrbances: none  Narrative Interpretation: unremarkable    MDM   Final diagnoses:  Community acquired pneumonia    Chest x-ray shows a new infiltrate in the superior segment of the right lower lobe. IV Rocephin, IV Zithromax. Patient is hemodynamic stable. Will discharge home on Zithromax. Discussed with patient.    Nat Christen, MD 11/15/15 1728

## 2015-11-22 ENCOUNTER — Telehealth: Payer: Self-pay | Admitting: *Deleted

## 2015-11-22 NOTE — Telephone Encounter (Signed)
F/u pt dizziness and starting midodrine, pt says feels much better and dizziness has stopped. Pt was in ED 11/15/15 for fever/aches, given Zpak and doing much better. Will forward to Dr. Francine Graven

## 2015-12-20 DIAGNOSIS — Z79891 Long term (current) use of opiate analgesic: Secondary | ICD-10-CM | POA: Diagnosis not present

## 2015-12-20 DIAGNOSIS — G894 Chronic pain syndrome: Secondary | ICD-10-CM | POA: Diagnosis not present

## 2015-12-20 DIAGNOSIS — M47812 Spondylosis without myelopathy or radiculopathy, cervical region: Secondary | ICD-10-CM | POA: Diagnosis not present

## 2015-12-20 DIAGNOSIS — M5137 Other intervertebral disc degeneration, lumbosacral region: Secondary | ICD-10-CM | POA: Diagnosis not present

## 2015-12-20 DIAGNOSIS — M47817 Spondylosis without myelopathy or radiculopathy, lumbosacral region: Secondary | ICD-10-CM | POA: Diagnosis not present

## 2015-12-20 DIAGNOSIS — Z79899 Other long term (current) drug therapy: Secondary | ICD-10-CM | POA: Diagnosis not present

## 2016-01-07 ENCOUNTER — Encounter: Payer: Self-pay | Admitting: Gastroenterology

## 2016-01-07 ENCOUNTER — Ambulatory Visit (INDEPENDENT_AMBULATORY_CARE_PROVIDER_SITE_OTHER): Payer: Medicare Other | Admitting: Gastroenterology

## 2016-01-07 VITALS — BP 123/71 | HR 97 | Temp 98.1°F | Ht 74.0 in | Wt 169.6 lb

## 2016-01-07 DIAGNOSIS — D509 Iron deficiency anemia, unspecified: Secondary | ICD-10-CM

## 2016-01-07 DIAGNOSIS — R634 Abnormal weight loss: Secondary | ICD-10-CM | POA: Diagnosis not present

## 2016-01-07 NOTE — Patient Instructions (Addendum)
Ask your primary care provider what medication you were taking for dizziness.   Please monitor your weight, and if anything changes, let me know.  I have recommended a year follow-up, but if you need anything, call me! We can certainly see you sooner.   Have a great summer!

## 2016-01-07 NOTE — Assessment & Plan Note (Signed)
Although he has lost weight from last visit, he has maintained around his baseline since early 2016. TCS/EGD, CT unrevealing. Likely multifactorial. Will see him again in 1 year or sooner if needed.

## 2016-01-07 NOTE — Assessment & Plan Note (Signed)
TCS/EGD up-to-date, no overt GI bleeding, Hgb stable. Followed by hematology. Consider capsule if worsening anemia. Return in 1 year or sooner if needed.

## 2016-01-07 NOTE — Progress Notes (Signed)
Referring Provider: [name], MD Primary Care Physician:  [name], MD  Primary GI: Dr. Oneida Alar   Chief Complaint  Patient presents with  . Follow-up    HPI:   Erik [name] is a 70 y.o. male presenting today with a history of weight loss and lack of appetite. EGD and colonoscopy unrevealing. CT without significant findings. Anemia followed by hematology.  Followed by neurology for Lambert-Eaton myasthenic syndrome. Weight is down about 8 lbs from last visit; however, it appears stable from early 2016. Hgb stable.   No abdominal pain, rectal bleeding, constipation. Back pain is same. Going to [place] on the 6th to see pain management for "shots in my back". States he has had issues with dizziness and took a small green pill that helped.   Past Medical History  Diagnosis Date  . Seizures ([place])     in remote past   . Chronic back pain   . DDD (degenerative disc disease), lumbar   . COPD (chronic obstructive pulmonary disease) ([place])   . Shortness of breath dyspnea   . GERD (gastroesophageal reflux disease)   . Low blood pressure   . Microcytic anemia 06/25/2015  . LEMS (Lambert-Eaton myasthenic syndrome) ([place]) 06/25/2015  . B12 deficiency 06/25/2015    Past Surgical History  Procedure Laterality Date  . None    . Colonoscopy N/A 11/12/2014    Dr. Clayburn Pert external and internal hemorrhoids, redundant left colon  . Esophagogastroduodenoscopy N/A 11/12/2014    Dr. Barnie Alderman non-erosive gastritis. negative H.pylori   . Esophageal dilation N/A 11/12/2014    Procedure: ESOPHAGEAL DILATION;  Surgeon: Danie Binder, MD;  [facility];  Service: Endoscopy;  Laterality: N/A;    Current Outpatient Prescriptions  Medication Sig Dispense Refill  . albuterol (PROVENTIL HFA;VENTOLIN HFA) 108 (90 BASE) MCG/ACT inhaler Inhale 2 puffs into the lungs every 6 (six) hours as needed for wheezing or shortness of breath. Reported on 10/25/2015    . aspirin  325 MG tablet Take 325 mg by mouth daily.    . DULoxetine (CYMBALTA) 30 MG capsule Take 30 mg by mouth daily.    . midodrine (PROAMATINE) 5 MG tablet Take 1 tablet (5 mg total) by mouth 3 (three) times daily with meals. 90 tablet 3  . vitamin B-12 (CYANOCOBALAMIN) 1000 MCG tablet Take 1,000 mcg by mouth daily.    . Zinc 50 MG CAPS Take 50 mg by mouth every morning.     No current facility-administered medications for this visit.    Allergies as of 01/07/2016  . (No Known Allergies)    Family History  Problem Relation Age of Onset  . Colon cancer Neg Hx   . Stomach cancer Maternal Grandfather   . Sickle cell anemia Daughter     Social History   Social History  . Marital Status: Legally Separated    Spouse Name: N/A  . Number of Children: N/A  . Years of Education: N/A   Social History Main Topics  . Smoking status: Former Smoker -- 0.50 packs/day for 21 years    Types: Cigarettes    Start date: 01/22/1963    Quit date: 08/11/1983  . Smokeless tobacco: Never Used  . Alcohol Use: No     Comment: remote past, history of ETOH abuse.   . Drug Use: No  . Sexual Activity: Not Asked   Other Topics Concern  . None   Social History Narrative    Review of Systems: As mentioned in  HPI.   Physical Exam: BP 123/71 mmHg  Pulse 97  Temp(Src) 98.1 F (36.7 C) (Oral)  Ht '6\' 2"'$  (1.88 m)  Wt 169 lb 9.6 oz (76.93 kg)  BMI 21.77 kg/m2 General:   Alert and oriented. No distress noted. Pleasant and cooperative.  Head:  Normocephalic and atraumatic. Eyes:  Conjuctiva clear without scleral icterus. Abdomen:  +BS, soft, non-tender and non-distended. No rebound or guarding. No HSM or masses noted. Msk:  Slight kyphosis, walks with cane Extremities:  Without edema. Neurologic:  Alert and  oriented x4;  grossly normal neurologically. Psych:  Alert and cooperative. Normal mood and affect.  Vitals - 1 value per visit 01/07/2016 11/15/2015 10/28/2015 10/25/2015 09/19/2015  Weight (lb) 169.6  175  176 171.7   Vitals - 1 value per visit 09/10/2015 07/10/2015 06/26/2015 06/13/2015  Weight (lb) 180 177.2 180.2 176.6   Vitals - 1 value per visit 05/27/2015 04/18/2015 04/11/2015 03/27/2015 01/22/2015  Weight (lb) 178 172.8  171.8 170.5   Vitals - 1 value per visit 01/04/2015  Weight (lb) 165.7   Lab Results  Component Value Date   WBC 13.6* 11/15/2015   HGB 12.5* 11/15/2015   HCT 38.2* 11/15/2015   MCV 68.0* 11/15/2015   PLT 389 11/15/2015   Lab Results  Component Value Date   IRON 58 01/04/2015   TIBC 301 01/04/2015   FERRITIN 62 03/27/2015

## 2016-01-07 NOTE — Progress Notes (Signed)
cc'ed to pcp °

## 2016-01-09 ENCOUNTER — Telehealth: Payer: Self-pay | Admitting: *Deleted

## 2016-01-09 NOTE — Telephone Encounter (Signed)
Ryan from Dr. Roe Rutherford office - pt is having lumabr epidural steroid injection on 01/17/16 and Dr. Greta Doom requesting pt to hold ASA for 7 days prior. Will forward to Dr. Harl Bowie

## 2016-01-09 NOTE — Telephone Encounter (Signed)
Ok to hold aspirin as needed for procedure.  Zandra Abts MD

## 2016-01-09 NOTE — Telephone Encounter (Signed)
Will forward to Dr. Otila Back office

## 2016-01-14 DIAGNOSIS — G894 Chronic pain syndrome: Secondary | ICD-10-CM | POA: Diagnosis not present

## 2016-01-14 DIAGNOSIS — M47812 Spondylosis without myelopathy or radiculopathy, cervical region: Secondary | ICD-10-CM | POA: Diagnosis not present

## 2016-01-14 DIAGNOSIS — M5137 Other intervertebral disc degeneration, lumbosacral region: Secondary | ICD-10-CM | POA: Diagnosis not present

## 2016-01-14 DIAGNOSIS — M47817 Spondylosis without myelopathy or radiculopathy, lumbosacral region: Secondary | ICD-10-CM | POA: Diagnosis not present

## 2016-01-16 ENCOUNTER — Other Ambulatory Visit (HOSPITAL_COMMUNITY): Payer: Self-pay | Admitting: Respiratory Therapy

## 2016-01-16 DIAGNOSIS — G4733 Obstructive sleep apnea (adult) (pediatric): Secondary | ICD-10-CM

## 2016-01-17 DIAGNOSIS — M47817 Spondylosis without myelopathy or radiculopathy, lumbosacral region: Secondary | ICD-10-CM | POA: Diagnosis not present

## 2016-02-04 ENCOUNTER — Encounter (HOSPITAL_COMMUNITY): Payer: Medicare Other | Attending: Hematology & Oncology | Admitting: Oncology

## 2016-02-04 ENCOUNTER — Encounter (HOSPITAL_COMMUNITY): Payer: Self-pay | Admitting: Oncology

## 2016-02-04 ENCOUNTER — Encounter (HOSPITAL_COMMUNITY): Payer: Medicare Other

## 2016-02-04 VITALS — BP 104/69 | HR 103 | Temp 97.8°F | Resp 16 | Wt 169.8 lb

## 2016-02-04 DIAGNOSIS — G7080 Lambert-Eaton syndrome, unspecified: Secondary | ICD-10-CM

## 2016-02-04 DIAGNOSIS — E538 Deficiency of other specified B group vitamins: Secondary | ICD-10-CM | POA: Diagnosis not present

## 2016-02-04 DIAGNOSIS — D509 Iron deficiency anemia, unspecified: Secondary | ICD-10-CM

## 2016-02-04 DIAGNOSIS — R634 Abnormal weight loss: Secondary | ICD-10-CM | POA: Insufficient documentation

## 2016-02-04 DIAGNOSIS — D649 Anemia, unspecified: Secondary | ICD-10-CM | POA: Insufficient documentation

## 2016-02-04 LAB — COMPREHENSIVE METABOLIC PANEL
ALBUMIN: 4 g/dL (ref 3.5–5.0)
ALK PHOS: 59 U/L (ref 38–126)
ALT: 12 U/L — ABNORMAL LOW (ref 17–63)
ANION GAP: 7 (ref 5–15)
AST: 20 U/L (ref 15–41)
BUN: 19 mg/dL (ref 6–20)
CHLORIDE: 101 mmol/L (ref 101–111)
CO2: 28 mmol/L (ref 22–32)
Calcium: 9 mg/dL (ref 8.9–10.3)
Creatinine, Ser: 1.07 mg/dL (ref 0.61–1.24)
GFR calc Af Amer: >60 mL/min (ref >60)
GFR calc non Af Amer: >60 mL/min (ref >60)
GLUCOSE: 105 mg/dL — AB (ref 65–99)
POTASSIUM: 3.8 mmol/L (ref 3.5–5.1)
SODIUM: 136 mmol/L (ref 135–145)
Total Bilirubin: 1 mg/dL (ref 0.3–1.2)
Total Protein: 7.7 g/dL (ref 6.5–8.1)

## 2016-02-04 LAB — CBC WITH DIFFERENTIAL/PLATELET
BASOS PCT: 1 %
Basophils Absolute: 0.1 10*3/uL (ref 0.0–0.1)
EOS ABS: 0.4 10*3/uL (ref 0.0–0.7)
Eosinophils Relative: 4 %
HCT: 40.3 % (ref 39.0–52.0)
HEMOGLOBIN: 13.1 g/dL (ref 13.0–17.0)
Lymphocytes Relative: 22 %
Lymphs Abs: 2.1 10*3/uL (ref 0.7–4.0)
MCH: 22.3 pg — ABNORMAL LOW (ref 26.0–34.0)
MCHC: 32.5 g/dL (ref 30.0–36.0)
MCV: 68.7 fL — ABNORMAL LOW (ref 78.0–100.0)
MONOS PCT: 6 %
Monocytes Absolute: 0.6 10*3/uL (ref 0.1–1.0)
NEUTROS PCT: 68 %
Neutro Abs: 6.5 10*3/uL (ref 1.7–7.7)
Platelets: 253 10*3/uL (ref 150–400)
RBC: 5.87 MIL/uL — ABNORMAL HIGH (ref 4.22–5.81)
RDW: 16.6 % — AB (ref 11.5–15.5)
WBC: 9.6 10*3/uL (ref 4.0–10.5)

## 2016-02-04 LAB — FERRITIN: Ferritin: 146 ng/mL (ref 24–336)

## 2016-02-04 LAB — IRON AND TIBC
Iron: 60 ug/dL (ref 45–182)
SATURATION RATIOS: 17 % — AB (ref 17.9–39.5)
TIBC: 349 ug/dL (ref 250–450)
UIBC: 289 ug/dL

## 2016-02-04 NOTE — Assessment & Plan Note (Addendum)
Microcytic anemia in the setting of sickle cell screen (daughter with sickle cell anemia and son with sickle cell trait) normal iron studies and GI evaluation. Unlike sickle cell disease, individuals with sickle cell trait have a normal hemoglobin level, hematocrit, reticulocyte count, red blood cell indices, and peripheral blood smear.  Work-up thus far demonstrates a positive sickle cell screen, borderline low B12 with patient now on sublingual B12 with excellent response, normal TSH, negative SPEP+IFE, normal ESR, leukocytosis with normal peripheral flow cytometry.  Of note, he has not had BMBX or BCR/ABL testing to date.   He has undergone GI work-up with EGD/Colonoscopy on 11/12/2014 resulting in an esophageal dilation.  Recent diagnosis of Lambert-Eaton Myasthenia Gravis Syndrome is noted.  This is being followed by neurology, Dr. Metta Clines.  Imaging has been negative for any findings suggestive of malignancy.  Labs today: CBC diff, CMET, iron/TIBC, ferritin.  I personally reviewed and went over laboratory results with the patient.  The results are noted within this dictation.  His WBC is WNL today and therefore, I will not pursue a BCR/ABL at this time.    Labs in 4 months: CBC diff, CMET, iron/TIBC, ferritin.  Return in 4 months for follow-up.  If all is well and stable, maybe we can consider decreasing the frequency of his appointments.

## 2016-02-04 NOTE — Assessment & Plan Note (Deleted)
Recently diagnosed by Dr. Michele Mcalpine (neurology) based upon NCV-EMG.    LEMS is often associated with a paraneoplastic disease. Approximately 50% of LEMS cases are associated with malignancy, mainly SCLC; and the prevalence of LEMS in [facility] patients is approximately 3%.  Thus, early recognition of LEMS is important as a possible early indicator of SCLC.  LEMS is not associated with hematologic malignancies.

## 2016-02-04 NOTE — Patient Instructions (Signed)
[place] at [facility] Discharge Instructions  RECOMMENDATIONS MADE BY THE CONSULTANT AND ANY TEST RESULTS WILL BE SENT TO YOUR REFERRING PHYSICIAN.  Labs are stable. Follow-up with neurology and cardiology as directed. Repeat labs in 4 months. Return in 4 months for follow-up.  Thank you for choosing [place] at [facility] to provide your oncology and hematology care.  To afford each patient quality time with our provider, please arrive at least 15 minutes before your scheduled appointment time.   Beginning January 23rd 2017 lab work for the [facility] will be done in the  Main lab at [facility] on 1st floor. If you have a lab appointment with the [place] [facility] please come in thru the  Main Entrance and check in at the main information desk  You need to re-schedule your appointment should you arrive 10 or more minutes late.  We strive to give you quality time with our providers, and arriving late affects you and other patients whose appointments are after yours.  Also, if you no show three or more times for appointments you may be dismissed from the clinic at the providers discretion.     Again, thank you for choosing [facility].  Our hope is that these requests will decrease the amount of time that you wait before being seen by our physicians.       _____________________________________________________________  Should you have questions after your visit to [facility], please contact our office at [phone] between the hours of 8:30 a.m. and 4:30 p.m.  Voicemails left after 4:30 p.m. will not be returned until the following business day.  For prescription refill requests, have your pharmacy contact our office.         Resources For Cancer Patients and their Caregivers ? American Cancer Society: Can assist with transportation, wigs, general needs, runs Look Good Feel Better.         210 127 7650 ? Cancer Care: Provides financial assistance, online support groups, medication/co-pay assistance.  1-800-813-HOPE 219-757-2106) ? [place] Assists [place] cancer patients and their families through emotional , educational and financial support.  (762)816-0619 ? [place] DSS Where to apply for food stamps, Medicaid and utility assistance. (717) 022-8814 ? [facility]: Transportation to medical appointments. (671)747-4870 ? Social Security Administration: May apply for disability if have a Stage IV cancer. 6403031907 417-810-3667 ? [employer], Disability and Transit Services: Assists with nutrition, care and transit needs. [place] Support Programs: '@10RELATIVEDAYS'$ @ > Cancer Support Group  2nd Tuesday of the month 1pm-2pm, Journey Room  > Creative Journey  3rd Tuesday of the month 1130am-1pm, Journey Room  > Look Good Feel Better  1st Wednesday of the month 10am-12 noon, Journey Room (Call [place] to register [phone])

## 2016-02-04 NOTE — Assessment & Plan Note (Signed)
On SL B12 replacement.

## 2016-02-04 NOTE — Progress Notes (Signed)
Erik Simmer, MD [name] [id number]  Microcytic anemia - Plan: Ferritin, Iron and TIBC, CBC with Differential, Basic metabolic panel, Ferritin, Iron and TIBC  B12 deficiency  LEMS (Lambert-Eaton myasthenic syndrome) (HCC)  CURRENT THERAPY: Observation  INTERVAL HISTORY: Erik [name] 71 y.o. male returns for followup of microcytic anemia in the setting of sickle cell screen (daughter with sickle cell anemia and son with sickle cell trait) normal iron studies and GI evaluation. Unlike sickle cell disease, individuals with sickle cell trait have a normal hemoglobin level, hematocrit, reticulocyte count, red blood cell indices, and peripheral blood smear.   He has been diagnosed with Lambert-Eaton Myasthenic Syndrome. Per neurology: IMPRESSION: Lambert-Eaton Myasthenic Syndrome, asymptomatic at this time Low back pain. He does have some mild foraminal stenosis but nothing significant  PLAN: 1. As patient does not exhibit any muscle weakness, would refrain from treatment for LEMS at this time. 2. Paraneoplastic source not found at this time. Will need to continue monitoring along with heme/onc 3. For back pain, will start baclofen and refer to pain management for possible injections. 4. Follow up in 6 months or as needed (particularly if he notes increased muscle weakness, diplopia, dysphagia)  15 minutes spent face to face with patient, over 50% spent discussing diagnosis and management. Erik [name], Erik [name]  He is doing well today without any complaints.  He notes that he wishes to restart Florinef (after a long investigation into the green pill shaped like a football).  This was discontinued by his cardiologist as the patient reported that it was ineffective.  I will defer this to his cardiologist.  Review of Systems  Constitutional: Negative.   HENT: Negative.   Eyes: Negative.   Respiratory: Negative.   Cardiovascular: Negative.     Gastrointestinal: Negative.   Genitourinary: Negative.   Musculoskeletal: Negative.   Skin: Negative.   Neurological: Negative.   Endo/Heme/Allergies: Negative.   Psychiatric/Behavioral: Negative.     Past Medical History  Diagnosis Date  . Seizures ([place])     in remote past   . Chronic back pain   . DDD (degenerative disc disease), lumbar   . COPD (chronic obstructive pulmonary disease) ([place])   . Shortness of breath dyspnea   . GERD (gastroesophageal reflux disease)   . Low blood pressure   . Microcytic anemia 06/25/2015  . LEMS (Lambert-Eaton myasthenic syndrome) ([place]) 06/25/2015  . B12 deficiency 06/25/2015    Past Surgical History  Procedure Laterality Date  . None    . Colonoscopy N/A 11/12/2014    Dr. Clayburn Pert external and internal hemorrhoids, redundant left colon  . Esophagogastroduodenoscopy N/A 11/12/2014    Dr. Barnie Alderman non-erosive gastritis. negative H.pylori   . Esophageal dilation N/A 11/12/2014    Procedure: ESOPHAGEAL DILATION;  Surgeon: Danie Binder, MD;  [facility];  Service: Endoscopy;  Laterality: N/A;    Family History  Problem Relation Age of Onset  . Colon cancer Neg Hx   . Stomach cancer Maternal Grandfather   . Sickle cell anemia Daughter     Social History   Social History  . Marital Status: Legally Separated    Spouse Name: N/A  . Number of Children: N/A  . Years of Education: N/A   Social History Main Topics  . Smoking status: Former Smoker -- 0.50 packs/day for 21 years    Types: Cigarettes    Start date: 01/22/1963    Quit date: 08/11/1983  . Smokeless  tobacco: Never Used  . Alcohol Use: No     Comment: remote past, history of ETOH abuse.   . Drug Use: No  . Sexual Activity: Not Asked   Other Topics Concern  . None   Social History Narrative     PHYSICAL EXAMINATION  ECOG PERFORMANCE STATUS: 2 - Symptomatic, <50% confined to bed  Filed Vitals:   02/04/16 1016  BP: 104/69  Pulse: 103  Temp: 97.8  F (36.6 C)  Resp: 16    GENERAL:alert, no distress, well nourished, well developed, comfortable, cooperative, smiling and accompanied by his daughter. SKIN: skin color, texture, turgor are normal, no rashes or significant lesions HEAD: Normocephalic, No masses, lesions, tenderness or abnormalities EYES: normal, EOMI, Conjunctiva are pink and non-injected EARS: External ears normal OROPHARYNX:lips, buccal mucosa, and tongue normal and mucous membranes are moist  NECK: supple, trachea midline LYMPH:  no palpable lymphadenopathy BREAST:not examined LUNGS: clear to auscultation and percussion HEART: regular rate & rhythm ABDOMEN:abdomen soft and normal bowel sounds BACK: Back symmetric, no curvature. EXTREMITIES:less then 2 second capillary refill, no joint deformities, effusion, or inflammation, no skin discoloration, no cyanosis  NEURO: alert & oriented x 3 with fluent speech   LABORATORY DATA: CBC    Component Value Date/Time   WBC 9.6 02/04/2016 0921   RBC 5.87* 02/04/2016 0921   RBC 5.11 01/04/2015 1630   HGB 13.1 02/04/2016 0921   HCT 40.3 02/04/2016 0921   PLT 253 02/04/2016 0921   MCV 68.7* 02/04/2016 0921   MCH 22.3* 02/04/2016 0921   MCHC 32.5 02/04/2016 0921   RDW 16.6* 02/04/2016 0921   LYMPHSABS 2.1 02/04/2016 0921   MONOABS 0.6 02/04/2016 0921   EOSABS 0.4 02/04/2016 0921   BASOSABS 0.1 02/04/2016 0921      Chemistry      Component Value Date/Time   NA 136 02/04/2016 0921   K 3.8 02/04/2016 0921   CL 101 02/04/2016 0921   CO2 28 02/04/2016 0921   BUN 19 02/04/2016 0921   CREATININE 1.07 02/04/2016 0921      Component Value Date/Time   CALCIUM 9.0 02/04/2016 0921   ALKPHOS 59 02/04/2016 0921   AST 20 02/04/2016 0921   ALT 12* 02/04/2016 0921   BILITOT 1.0 02/04/2016 0921     Lab Results  Component Value Date   FOLATE 19.4 01/04/2015   Lab Results  Component Value Date   VITAMINB12 1241* 10/25/2015   Lab Results  Component Value Date    IRON 60 02/04/2016   TIBC 349 02/04/2016   FERRITIN 146 02/04/2016    PENDING LABS:   RADIOGRAPHIC STUDIES:  No results found.   PATHOLOGY:    ASSESSMENT AND PLAN:  Microcytic anemia Microcytic anemia in the setting of sickle cell screen (daughter with sickle cell anemia and son with sickle cell trait) normal iron studies and GI evaluation. Unlike sickle cell disease, individuals with sickle cell trait have a normal hemoglobin level, hematocrit, reticulocyte count, red blood cell indices, and peripheral blood smear.  Work-up thus far demonstrates a positive sickle cell screen, borderline low B12 with patient now on sublingual B12 with excellent response, normal TSH, negative SPEP+IFE, normal ESR, leukocytosis with normal peripheral flow cytometry.  Of note, he has not had BMBX or BCR/ABL testing to date.   He has undergone GI work-up with EGD/Colonoscopy on 11/12/2014 resulting in an esophageal dilation.  Recent diagnosis of Lambert-Eaton Myasthenia Gravis Syndrome is noted.  This is being followed by neurology, Dr. Metta [name].  Imaging has been negative for any findings suggestive of malignancy.  Labs today: CBC diff, CMET, iron/TIBC, ferritin.  I personally reviewed and went over laboratory results with the patient.  The results are noted within this dictation.  His WBC is WNL today and therefore, I will not pursue a BCR/ABL at this time.    Labs in 4 months: CBC diff, CMET, iron/TIBC, ferritin.  Return in 4 months for follow-up.  If all is well and stable, maybe we can consider decreasing the frequency of his appointments.   B12 deficiency On SL B12 replacement.    ORDERS PLACED FOR THIS ENCOUNTER: Orders Placed This Encounter  Procedures  . Ferritin  . Iron and TIBC  . CBC with Differential  . Basic metabolic panel  . Ferritin  . Iron and TIBC    MEDICATIONS PRESCRIBED THIS ENCOUNTER: No orders of the defined types were placed in this encounter.    THERAPY PLAN:   Continue with observation of blood counts and iron studies.  All questions were answered. The patient knows to call the clinic with any problems, questions or concerns. We can certainly see the patient much sooner if necessary.  Patient and plan discussed with Dr. Ancil Linsey and she is in agreement with the aforementioned.   This note is electronically signed by: Doy Mince 02/04/2016 6:34 PM

## 2016-02-12 ENCOUNTER — Ambulatory Visit: Payer: Medicare Other | Attending: Physical Medicine and Rehabilitation | Admitting: Neurology

## 2016-02-12 ENCOUNTER — Encounter (INDEPENDENT_AMBULATORY_CARE_PROVIDER_SITE_OTHER): Payer: Self-pay

## 2016-02-12 DIAGNOSIS — Z7982 Long term (current) use of aspirin: Secondary | ICD-10-CM | POA: Diagnosis not present

## 2016-02-12 DIAGNOSIS — I493 Ventricular premature depolarization: Secondary | ICD-10-CM | POA: Diagnosis not present

## 2016-02-12 DIAGNOSIS — G4733 Obstructive sleep apnea (adult) (pediatric): Secondary | ICD-10-CM | POA: Diagnosis not present

## 2016-02-12 DIAGNOSIS — R0683 Snoring: Secondary | ICD-10-CM | POA: Diagnosis not present

## 2016-02-12 DIAGNOSIS — Z79899 Other long term (current) drug therapy: Secondary | ICD-10-CM | POA: Diagnosis not present

## 2016-02-16 NOTE — Procedures (Signed)
  [place] A. Merlene Laughter, MD     [email or web address]             NOCTURNAL POLYSOMNOGRAPHY   LOCATION: [facility]   Patient Name: Erik [name], Erik [name] Date: 02/12/2016 Gender: Male D.O.B: March 16, 1946 Age (years): 40 Referring Provider: Not Available Height (inches): 72 Interpreting Physician: Phillips Odor MD, ABSM Weight (lbs): 169 RPSGT: Peak, Robert BMI: 23 MRN: [id number] Neck Size: 15.00 CLINICAL INFORMATION Sleep Study Type: NPSG Indication for sleep study: N/A Epworth Sleepiness Score: 1 SLEEP STUDY TECHNIQUE As per the AASM Manual for the Scoring of Sleep and Associated Events v2.3 (April 2016) with a hypopnea requiring 4% desaturations. The channels recorded and monitored were frontal, central and occipital EEG, electrooculogram (EOG), submentalis EMG (chin), nasal and oral airflow, thoracic and abdominal wall motion, anterior tibialis EMG, snore microphone, electrocardiogram, and pulse oximetry. MEDICATIONS Patient's medications include: N/A. Medications self-administered by patient during sleep study : No sleep medicine administered.   Current outpatient prescriptions:  .  albuterol (PROVENTIL HFA;VENTOLIN HFA) 108 (90 BASE) MCG/ACT inhaler, Inhale 2 puffs into the lungs every 6 (six) hours as needed for wheezing or shortness of breath. Reported on 02/04/2016, Disp: , Rfl:  .  aspirin 325 MG tablet, Take 325 mg by mouth daily., Disp: , Rfl:  .  DULoxetine (CYMBALTA) 30 MG capsule, Take 30 mg by mouth daily., Disp: , Rfl:  .  midodrine (PROAMATINE) 5 MG tablet, Take 1 tablet (5 mg total) by mouth 3 (three) times daily with meals., Disp: 90 tablet, Rfl: 3 .  vitamin B-12 (CYANOCOBALAMIN) 1000 MCG tablet, Take 1,000 mcg by mouth daily., Disp: , Rfl:  .  Zinc 50 MG CAPS, Take 50 mg by mouth every morning., Disp: , Rfl:   SLEEP ARCHITECTURE The study was initiated at 10:30:50 PM and ended at 5:10:22 AM. Sleep onset time was 58.2 minutes and the  sleep efficiency was 70.8%. The total sleep time was 283.0 minutes. Stage REM latency was 48.0 minutes. The patient spent 26.86% of the night in stage N1 sleep, 61.84% in stage N2 sleep, 0.00% in stage N3 and 11.31% in REM. Alpha intrusion was absent. Supine sleep was 100.00%. RESPIRATORY PARAMETERS The overall apnea/hypopnea index (AHI) was 4.2 per hour. There were 7 total apneas, including 2 obstructive, 5 central and 0 mixed apneas. There were 13 hypopneas and 50 RERAs. The AHI during Stage REM sleep was 13.1 per hour. AHI while supine was 4.2 per hour. The mean oxygen saturation was 94.94%. The minimum SpO2 during sleep was 91.00%. snoring was noted during this study. CARDIAC DATA The 2 lead EKG demonstrated sinus rhythm. The mean heart rate was 65.72 beats per minute. Other EKG findings include: PVCs. LEG MOVEMENT DATA The total PLMS were 0 with a resulting PLMS index of 0.00. Associated arousal with leg movement index was 0.0.    IMPRESSIONS - No significant obstructive sleep apnea occurred during this study. - No significant central sleep apnea occurred during this study. - Clinically significant periodic limb movements did not occur during sleep. No significant associated arousals.  Delano Metz, MD Diplomate, American Board of Sleep Medicine.

## 2016-02-19 DIAGNOSIS — M47817 Spondylosis without myelopathy or radiculopathy, lumbosacral region: Secondary | ICD-10-CM | POA: Diagnosis not present

## 2016-03-10 ENCOUNTER — Encounter: Payer: Self-pay | Admitting: Cardiology

## 2016-03-10 ENCOUNTER — Ambulatory Visit (INDEPENDENT_AMBULATORY_CARE_PROVIDER_SITE_OTHER): Payer: Medicare Other | Admitting: Cardiology

## 2016-03-10 VITALS — BP 121/78 | HR 97 | Ht 71.0 in | Wt 167.0 lb

## 2016-03-10 DIAGNOSIS — I951 Orthostatic hypotension: Secondary | ICD-10-CM

## 2016-03-10 DIAGNOSIS — R42 Dizziness and giddiness: Secondary | ICD-10-CM

## 2016-03-10 MED ORDER — MIDODRINE HCL 10 MG PO TABS: 10.0000 mg | ORAL_TABLET | Freq: Three times a day (TID) | ORAL | 6 refills | Status: DC

## 2016-03-10 NOTE — Progress Notes (Signed)
Clinical Summary Erik [name] is a 70 y.o.male seen today for follow up of the following medical problems.   1. Orthostatic hypotension - long history. He had done well on midodrine and florinef in the past, however a few visits ago stopped taking them because he thought one may be causing his ability to taste. He did well for awhile but began having recurrent orthostatic symptoms - restarted on midodrine, on '5mg'$  tid still having symptoms.  - Not using compression stockings. Drinks heavy fluids, heavy salt intake. -reports severe dizziness with standing.    Past Medical History:  Diagnosis Date  . B12 deficiency 06/25/2015  . Chronic back pain   . COPD (chronic obstructive pulmonary disease) ([place])   . DDD (degenerative disc disease), lumbar   . GERD (gastroesophageal reflux disease)   . 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 Low blood pressure   . Microcytic anemia 06/25/2015  . Seizures ([place])    in remote past   . Shortness of breath dyspnea      No Known Allergies   Current Outpatient Prescriptions  Medication Sig Dispense Refill  . albuterol (PROVENTIL HFA;VENTOLIN HFA) 108 (90 BASE) MCG/ACT inhaler Inhale 2 puffs into the lungs every 6 (six) hours as needed for wheezing or shortness of breath. Reported on 02/04/2016    . aspirin 325 MG tablet Take 325 mg by mouth daily.    . DULoxetine (CYMBALTA) 30 MG capsule Take 30 mg by mouth daily.    . midodrine (PROAMATINE) 5 MG tablet Take 1 tablet (5 mg total) by mouth 3 (three) times daily with meals. 90 tablet 3  . vitamin B-12 (CYANOCOBALAMIN) 1000 MCG tablet Take 1,000 mcg by mouth daily.    . Zinc 50 MG CAPS Take 50 mg by mouth every morning.     No current facility-administered medications for this visit.      Past Surgical History:  Procedure Laterality Date  . COLONOSCOPY N/A 11/12/2014   Dr. Clayburn [name] external and internal hemorrhoids, redundant left colon  . ESOPHAGEAL DILATION N/A  11/12/2014   Procedure: ESOPHAGEAL DILATION;  Surgeon: Erik Binder, MD;  Location: [facility];  Service: Endoscopy;  Laterality: N/A;  . ESOPHAGOGASTRODUODENOSCOPY N/A 11/12/2014   Dr. Fields:mild non-erosive gastritis. negative H.pylori   . None       No Known Allergies    Family History  Problem Relation Age of Onset  . Colon cancer Neg Hx   . Stomach cancer Maternal Grandfather   . Sickle cell anemia Daughter      Social History Erik [name] reports that he quit smoking about 32 years ago. His smoking use included Cigarettes. He started smoking about 53 years ago. He has a 10.50 pack-year smoking history. He has never used smokeless tobacco. Erik [name] reports that he does not drink alcohol.   Review of Systems CONSTITUTIONAL: No weight loss, fever, chills, weakness or fatigue.  HEENT: Eyes: No visual loss, blurred vision, double vision or yellow sclerae.No hearing loss, sneezing, congestion, runny nose or sore throat.  SKIN: No rash or itching.  CARDIOVASCULAR: no chest pain, no palpitations.  RESPIRATORY: No shortness of breath, cough or sputum.  GASTROINTESTINAL: No anorexia, nausea, vomiting or diarrhea. No abdominal pain or blood.  GENITOURINARY: No burning on urination, no polyuria NEUROLOGICAL: +dizziness MUSCULOSKELETAL: No muscle, back pain, joint pain or stiffness.  LYMPHATICS: No enlarged nodes. No history of splenectomy.  PSYCHIATRIC: No history of depression or anxiety.  ENDOCRINOLOGIC: No reports of  sweating, cold or heat intolerance. No polyuria or polydipsia.  Marland Kitchen   Physical Examination Vitals:   03/10/16 1456  BP: 121/78  Pulse: 97   Vitals:   03/10/16 1456  Weight: 167 lb (75.8 kg)  Height: '5\' 11"'$  (1.803 m)    Gen: resting comfortably, no acute distress HEENT: no scleral icterus, pupils equal round and reactive, no palptable cervical adenopathy,  CV: RRR, no m/r/g no jvd Resp: Clear to auscultation bilaterally GI: abdomen is soft,  non-tender, non-distended, normal bowel sounds, no hepatosplenomegaly MSK: extremities are warm, no edema.  Skin: warm, no rash Neuro:  no focal deficits Psych: appropriate affect   Diagnostic Studies  06/2014 echo Study Conclusions  - Left ventricle: The cavity size was normal. Wall thickness was normal. Systolic function was normal. The estimated ejection fraction was in the range of 55% to 60%. Wall motion was normal; there were no regional wall motion abnormalities. Doppler parameters are consistent with abnormal left ventricular relaxation (grade 1 diastolic dysfunction). - Aortic valve: Trileaflet; mildly thickened leaflets. - Mitral valve: Mildly thickened leaflets . There was trivial regurgitation. - Right atrium: Central venous pressure (est): 3 mm Hg. - Atrial septum: No defect or patent foramen ovale was identified. - Tricuspid valve: There was mild regurgitation. - Pulmonary arteries: PA peak pressure: 34 mm Hg (S). - Pericardium, extracardiac: There was no pericardial effusion.  Impressions:  - Normal LV wall thickness with LVEF 10-93%, grade 1 diastolic dysfunction. Mildly thickened aortic valve. Trivial mitral and mild regurgitation. PASP 34 mmHg.   Assessment and Plan  1. Orthostatic symptoms - currently with severe symptoms. We will increase midodrine to '10mg'$  tid. Asked to call us next week to update [place], if contiued symptoms restart florinef - continue aggressive hydration and salt intake. He was not comfortable in compression stockings.  - unclear if either midodrine or florinef in the past really affected his ability to taste, follow symptoms as we reintroduce these medicines.    F/u 4 months     Erik [name], M.D

## 2016-03-10 NOTE — Patient Instructions (Signed)
Medication Instructions:   Increase Midodrine to '10mg'$  three times per day - new sent to [facility] today.  Continue all other medications.    Labwork:  None.  Testing/Procedures:  None.  Follow-Up:  4 months  Any Other Special Instructions Will Be Listed Below (If Applicable).  Call office in 1 week with update on dizziness.   If you need a refill on your cardiac medications before your next appointment, please call your pharmacy.

## 2016-03-11 DIAGNOSIS — M47817 Spondylosis without myelopathy or radiculopathy, lumbosacral region: Secondary | ICD-10-CM | POA: Diagnosis not present

## 2016-03-11 DIAGNOSIS — M47812 Spondylosis without myelopathy or radiculopathy, cervical region: Secondary | ICD-10-CM | POA: Diagnosis not present

## 2016-03-11 DIAGNOSIS — G894 Chronic pain syndrome: Secondary | ICD-10-CM | POA: Diagnosis not present

## 2016-03-11 DIAGNOSIS — M5137 Other intervertebral disc degeneration, lumbosacral region: Secondary | ICD-10-CM | POA: Diagnosis not present

## 2016-03-18 ENCOUNTER — Encounter: Payer: Self-pay | Admitting: Neurology

## 2016-03-18 ENCOUNTER — Ambulatory Visit (INDEPENDENT_AMBULATORY_CARE_PROVIDER_SITE_OTHER): Payer: Medicare Other | Admitting: Neurology

## 2016-03-18 VITALS — HR 108 | Ht 72.0 in | Wt 171.0 lb

## 2016-03-18 DIAGNOSIS — M5440 Lumbago with sciatica, unspecified side: Secondary | ICD-10-CM

## 2016-03-18 DIAGNOSIS — I951 Orthostatic hypotension: Secondary | ICD-10-CM | POA: Diagnosis not present

## 2016-03-18 DIAGNOSIS — G7080 Lambert-Eaton syndrome, unspecified: Secondary | ICD-10-CM | POA: Diagnosis not present

## 2016-03-18 NOTE — Progress Notes (Signed)
Chart forwarded.  

## 2016-03-18 NOTE — Patient Instructions (Signed)
1.  Follow up with oncology  2.  Follow up with pain doctor 3.  Follow up with me in 6 months.  If you notice double vision, problems swallowing, difficulty breathing or weakness in neck, arms and legs, contact me and see me sooner

## 2016-03-18 NOTE — Progress Notes (Signed)
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  He is accompanied by his daughter who supplements history.  UPDATE:  He denies dysphagia at this time.  He denies diplopia, limb weakness, neck weakness or shortness of breath. Back pain is still an issue.  Gabapentin and PT were ineffective.  He is seeing pain management.  Epidural injection not effective.  He had pneumonia in April.  He has been followed by heme/onc.  Workup revealed microcytic anemia with positive sickle cell screen, normal TSH, negative SPEP/IFE and normal ESR.   He still gets lightheaded when he stands up.  He is on Midodrine.  He has not been using compression stockings.   HISTORY: He developed low back pain about a year ago.  There was no precipitating event.  He reports mid low back pain with diffuse pain radiating down both legs to the feet.  He describes it as a deep burning pain.  It occurs in all positions, either laying down, sitting or ambulating.  He notes some weakness in the legs as well.  He denies numbness.  For the past year, he uses a cane.  He denies bowel or bladder dysfunction but reports impotence over the past year.  He has orthostatic hypotension and takes Florinef and midodrine.  He notes mild neck pain.   MRI of the lumbar spine from 07/17/14 showed spondylosis and degenerative disc disease with disc bulge causing mild bilateral foraminal stenosis at L3-4 and disc bulge at L4-5 causing mild right bilateral foraminal stenosis and right greater than left facet arthropathy.   He saw a neurologist in [place].  NCV-EMG demonstrated demyelinating peripheral neuropathy.  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  CSF protein was 23.  Glucose was 60.  Cell count was 0.  Labs in May  revealed CBC with chronic mild microcytic anemia, Sed Rate 18, Kappa free light chain 23.29, normal IgG, IgA and IgM, normal SPEP and IFE, B12 205.   He does report that he feels weaker in the mornings, and improves later in the day.  He is a former smoker.  As per EPIC chart, he has had a 14 lb weight loss over the past year, although he and his daughter report a 40 lb weight loss.  He has poor appetite, however.  A NCV-EMG was performed on 06/11/15.  Findings did not show peripheral neuropathy, but it did reveal post-exercise facilitation of motor responses, suggesting a pre-synaptic neuromuscular junction disorder.  Voltage-gated calcium channel antibodies were positive. CT of chest showed a 9 mm sub sold nodule within the right upper lobe.  CT of abdomen and pelvis was unremarkable. He was referred to heme/onc.  He had repeat CT CAP yesterday, revealing that the right upper lobe nodule as resolved.  There is a subcentimeter nodularity in the perivascular space, which is unchanged from prior exam.  PAST MEDICAL HISTORY: Past Medical History:  Diagnosis Date  . B12 deficiency 06/25/2015  . Chronic back pain   . COPD (chronic obstructive pulmonary disease) ([place])   . DDD (degenerative disc disease), lumbar   . GERD (gastroesophageal reflux disease)   . 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 Low blood pressure   . Microcytic anemia 06/25/2015  . Seizures ([place])    in remote past   .  Shortness of breath dyspnea     MEDICATIONS: Current Outpatient Prescriptions on File Prior to Visit  Medication Sig Dispense Refill  . aspirin 325 MG tablet Take 325 mg by mouth daily.    . DULoxetine (CYMBALTA) 30 MG capsule Take 30 mg by mouth daily.    . midodrine (PROAMATINE) 10 MG tablet Take 1 tablet (10 mg total) by mouth 3 (three) times daily. 90 tablet 6  . vitamin B-12 (CYANOCOBALAMIN) 1000 MCG tablet Take 1,000 mcg by mouth daily.    . Zinc 50 MG CAPS Take 50 mg by mouth every  morning.     No current facility-administered medications on file prior to visit.     ALLERGIES: No Known Allergies  FAMILY HISTORY: Family History  Problem Relation Age of Onset  . Colon cancer Neg Hx   . Stomach cancer Maternal Grandfather   . Sickle cell anemia Daughter     SOCIAL HISTORY: Social History   Social History  . Marital status: Legally Separated    Spouse name: N/A  . Number of children: N/A  . Years of education: N/A   Occupational History  . Not on file.   Social History Main Topics  . Smoking status: Former Smoker    Packs/day: 0.50    Years: 21.00    Types: Cigarettes    Start date: 01/22/1963    Quit date: 08/11/1983  . Smokeless tobacco: Never Used  . Alcohol use No     Comment: remote past, history of ETOH abuse.   . Drug use: No  . Sexual activity: Not on file   Other Topics Concern  . Not on file   Social History Narrative  . No narrative on file    REVIEW OF SYSTEMS: Constitutional: No fevers, chills, or sweats, no generalized fatigue, change in appetite Eyes: No visual changes, double vision, eye pain Ear, nose and throat: No hearing loss, ear pain, nasal congestion, sore throat Cardiovascular: No chest pain, palpitations Respiratory:  No shortness of breath at rest or with exertion, wheezes GastrointestinaI: No nausea, vomiting, diarrhea, abdominal pain, fecal incontinence Genitourinary:  No dysuria, urinary retention or frequency Musculoskeletal:  back pain Integumentary: No rash, pruritus, skin lesions Neurological: as above Psychiatric: No depression, insomnia, anxiety Endocrine: No palpitations, fatigue, diaphoresis, mood swings, change in appetite, change in weight, increased thirst Hematologic/Lymphatic:  No purpura, petechiae. Allergic/Immunologic: no itchy/runny eyes, nasal congestion, recent allergic reactions, rashes  PHYSICAL EXAM: Vitals:   03/18/16 1036  Pulse: (!) 108  Supine: BP 130/80 Pulse 90 Sitting: BP  134/74 Pulse 80 Standing: BP 116/62 Pulse 98 General: No acute distress.  Patient appears well-groomed.  normal body habitus. Head:  Normocephalic/atraumatic Eyes:  Fundi examined but not visualized Neck: supple, no paraspinal tenderness, full range of motion Heart:  Regular rate and rhythm Lungs:  Clear to auscultation bilaterally Back: bilateral paraspinal tenderness Neurological Exam: alert and oriented to person, place, and time. Attention span and concentration intact, recent and remote memory intact, fund of knowledge intact.  Speech fluent and not dysarthric, language intact.  CN II-XII intact. Bulk and tone normal, muscle strength 5/5 throughout.  Sensation to light touch intact.  Deep tendon reflexes 2+ throughout, toes downgoing.  Finger to nose testing intact.  Antalgic gait.  IMPRESSION: Pincus Badder Myasthenic Syndrome, asymptomatic Low back pain Orthostatic hypotension  PLAN: 1.  Follow up with oncology  2.  Follow up with pain doctor 3.  Follow up with me in 6 months.  Advised that if  he notices double vision, problems swallowing, difficulty breathing or weakness in neck, arms and legs, contact me and see me sooner 4.  Try using compression stockings.  27 minutes spent face to face with patient, over 50% spent counseling.  Metta Clines, DO  CC:  [name], NP

## 2016-04-02 ENCOUNTER — Telehealth: Payer: Self-pay | Admitting: Cardiology

## 2016-04-02 ENCOUNTER — Encounter: Payer: Self-pay | Admitting: *Deleted

## 2016-04-02 MED ORDER — FLUDROCORTISONE ACETATE 0.1 MG PO TABS: 0.1000 mg | ORAL_TABLET | Freq: Every day | ORAL | 3 refills | Status: DC

## 2016-04-02 NOTE — Telephone Encounter (Signed)
Pt aware and will update [place] in 1 week. Medication sent to pharmacy.

## 2016-04-02 NOTE — Telephone Encounter (Signed)
Pt daughter says that pt is still dizzy and has been taking midodrine tid - says pt is not wanting to participate any many activities - says he is tired and weak most of the time. Will forward to Dr. Harl Bowie

## 2016-04-02 NOTE — Telephone Encounter (Signed)
Start florinef 0.'1mg'$  daily. Have him call us back in 1 week with update in symptoms   Zandra Abts MD

## 2016-04-02 NOTE — Telephone Encounter (Signed)
Patient is still dizzy after medication adjustment

## 2016-06-02 DIAGNOSIS — M47812 Spondylosis without myelopathy or radiculopathy, cervical region: Secondary | ICD-10-CM | POA: Diagnosis not present

## 2016-06-02 DIAGNOSIS — M5137 Other intervertebral disc degeneration, lumbosacral region: Secondary | ICD-10-CM | POA: Diagnosis not present

## 2016-06-02 DIAGNOSIS — G894 Chronic pain syndrome: Secondary | ICD-10-CM | POA: Diagnosis not present

## 2016-06-02 DIAGNOSIS — M47817 Spondylosis without myelopathy or radiculopathy, lumbosacral region: Secondary | ICD-10-CM | POA: Diagnosis not present

## 2016-06-04 ENCOUNTER — Encounter (HOSPITAL_COMMUNITY): Payer: Self-pay | Admitting: Hematology & Oncology

## 2016-06-04 ENCOUNTER — Encounter (HOSPITAL_COMMUNITY): Payer: Medicare Other

## 2016-06-04 ENCOUNTER — Other Ambulatory Visit (HOSPITAL_COMMUNITY): Payer: Self-pay | Admitting: Oncology

## 2016-06-04 ENCOUNTER — Encounter (HOSPITAL_COMMUNITY): Payer: Medicare Other | Attending: Hematology & Oncology | Admitting: Hematology & Oncology

## 2016-06-04 ENCOUNTER — Encounter (HOSPITAL_COMMUNITY): Payer: Self-pay | Admitting: Oncology

## 2016-06-04 VITALS — BP 119/64 | HR 82 | Temp 98.7°F | Resp 16 | Wt 179.4 lb

## 2016-06-04 DIAGNOSIS — D573 Sickle-cell trait: Secondary | ICD-10-CM

## 2016-06-04 DIAGNOSIS — D509 Iron deficiency anemia, unspecified: Secondary | ICD-10-CM

## 2016-06-04 DIAGNOSIS — G7080 Lambert-Eaton syndrome, unspecified: Secondary | ICD-10-CM | POA: Diagnosis not present

## 2016-06-04 DIAGNOSIS — E538 Deficiency of other specified B group vitamins: Secondary | ICD-10-CM

## 2016-06-04 HISTORY — DX: Iron deficiency anemia, unspecified: D50.9

## 2016-06-04 LAB — BASIC METABOLIC PANEL
ANION GAP: 6 (ref 5–15)
BUN: 15 mg/dL (ref 6–20)
CALCIUM: 9.4 mg/dL (ref 8.9–10.3)
CO2: 31 mmol/L (ref 22–32)
Chloride: 101 mmol/L (ref 101–111)
Creatinine, Ser: 1.22 mg/dL (ref 0.61–1.24)
GFR, EST NON AFRICAN AMERICAN: 58 mL/min — AB (ref >60)
Glucose, Bld: 110 mg/dL — ABNORMAL HIGH (ref 65–99)
POTASSIUM: 3.7 mmol/L (ref 3.5–5.1)
SODIUM: 138 mmol/L (ref 135–145)

## 2016-06-04 LAB — CBC WITH DIFFERENTIAL/PLATELET
BASOS ABS: 0.1 10*3/uL (ref 0.0–0.1)
Basophils Relative: 1 %
EOS ABS: 1 10*3/uL — AB (ref 0.0–0.7)
Eosinophils Relative: 10 %
HCT: 38.5 % — ABNORMAL LOW (ref 39.0–52.0)
Hemoglobin: 12.6 g/dL — ABNORMAL LOW (ref 13.0–17.0)
Lymphocytes Relative: 18 %
Lymphs Abs: 1.8 10*3/uL (ref 0.7–4.0)
MCH: 22.7 pg — ABNORMAL LOW (ref 26.0–34.0)
MCHC: 32.7 g/dL (ref 30.0–36.0)
MCV: 69.2 fL — AB (ref 78.0–100.0)
MONO ABS: 0.7 10*3/uL (ref 0.1–1.0)
MONOS PCT: 7 %
Neutro Abs: 6.5 10*3/uL (ref 1.7–7.7)
Neutrophils Relative %: 64 %
PLATELETS: 300 10*3/uL (ref 150–400)
RBC: 5.56 MIL/uL (ref 4.22–5.81)
RDW: 16.4 % — AB (ref 11.5–15.5)
WBC: 10.1 10*3/uL (ref 4.0–10.5)

## 2016-06-04 LAB — IRON AND TIBC
Iron: 53 ug/dL (ref 45–182)
SATURATION RATIOS: 16 % — AB (ref 17.9–39.5)
TIBC: 330 ug/dL (ref 250–450)
UIBC: 277 ug/dL

## 2016-06-04 LAB — FERRITIN: FERRITIN: 80 ng/mL (ref 24–336)

## 2016-06-04 NOTE — Progress Notes (Signed)
[place] at [facility] Progress Note  Patient Care Team: Erik Simmer, MD as PCP - General (Nurse Practitioner) Erik Binder, MD as Consulting Physician (Gastroenterology)  CHIEF COMPLAINTS/PURPOSE OF CONSULTATION:  Microcytic Anemia, labs on 07/30/2014 with hgb 12, hct 37.3 and mcv 70.4 Normal iron studies Positive sickle cell screen Normal serum immunofixation Colonoscopy 11/12/2014 EGD 11/12/2014 Esophageal dilation 11/12/2014 Leukocytosis B12 deficiency Erik [name] Myasthenia Gravis       HISTORY OF PRESENTING ILLNESS:  Erik [name] 70 y.o. male is here for follow-up of microcytic anemia. He has been evaluated by GI. He is noted to have a positive sickle cell screen. Labs are overall stable. Weight is stable.   He has been diagnosed with Lambert-Eaton Myasthenic Syndrome. Per neurology: IMPRESSION: Lambert-Eaton Myasthenic Syndrome, asymptomatic at this time Low back pain. He does have some mild foraminal stenosis but nothing significant  PLAN: 1. As patient does not exhibit any muscle weakness, would refrain from treatment for LEMS at this time. 2. Paraneoplastic source not found at this time. Will need to continue monitoring along with heme/onc 3. For back pain, will start baclofen and refer to pain management for possible injections. 4. Follow up in 6 months or as needed (particularly if he notes increased muscle weakness, diplopia, dysphagia)  15 minutes spent face to face with patient, over 50% spent discussing diagnosis and management.  Erik Clines, DO  He underwent CT C/A/P last on 09/18/2015 with results as noted: IMPRESSION: 1. Sub cm nodularity in the prevascular space is unchanged. No dominant mass. 2. Right upper lobe ground-glass nodule has resolved. 3. Coronary artery calcification  Patient states he is eating more than he was previously. He says after he stopping taking blood pressure medication, he is able to taste  food more.   Erik [name] reports constipation from current medication. He does not see a GI but is up to date with colonoscopy.   Erik [name] has been following up with his neurologist.  He notes that he feels fairly well. No major complaints today.  Blood counts have been stable.    MEDICAL HISTORY:  Past Medical History:  Diagnosis Date  . B12 deficiency 06/25/2015  . Chronic back pain   . COPD (chronic obstructive pulmonary disease) ([place])   . DDD (degenerative disc disease), lumbar   . GERD (gastroesophageal reflux disease)   . LEMS (Lambert-Eaton myasthenic syndrome) ([place]) 06/25/2015  . Low blood pressure   . Microcytic anemia 06/25/2015  . Seizures ([place])    in remote past   . Shortness of breath dyspnea     SURGICAL HISTORY: Past Surgical History:  Procedure Laterality Date  . COLONOSCOPY N/A 11/12/2014   Dr. Clayburn Pert external and internal hemorrhoids, redundant left colon  . ESOPHAGEAL DILATION N/A 11/12/2014   Procedure: ESOPHAGEAL DILATION;  Surgeon: Erik Binder, MD;  [facility];  Service: Endoscopy;  Laterality: N/A;  . ESOPHAGOGASTRODUODENOSCOPY N/A 11/12/2014   Dr. Fields:mild non-erosive gastritis. negative H.pylori   . None      SOCIAL HISTORY: Social History   Social History  . Marital status: Legally Separated    Spouse name: N/A  . Number of children: N/A  . Years of education: N/A   Occupational History  . Not on file.   Social History Main Topics  . Smoking status: Former Smoker    Packs/day: 0.50    Years: 21.00    Types: Cigarettes    Start date: 01/22/1963    Quit date: 08/11/1983  .  Smokeless tobacco: Never Used  . Alcohol use No     Comment: remote past, history of ETOH abuse.   . Drug use: No  . Sexual activity: Not on file   Other Topics Concern  . Not on file   Social History Narrative  . No narrative on file   Separated. 2 children. 1 boy and 1 girl. Quit smoking in the 80's. Quit drinking in the 80's. 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   FAMILY HISTORY: Family History  Problem Relation Age of Onset  . Stomach cancer Maternal Grandfather   . Sickle cell anemia Daughter   . Colon cancer Neg Hx    indicated that the status of his maternal grandfather is unknown. He indicated that the status of his daughter is unknown. He indicated that the status of his neg hx is unknown.    Daughter has sickle cell anemia. Son has sickle cell trait. Mother deceased 57-60 yo, Alzeheimers. Father deceased close to 29 yo. Unsure what he died from. 2 step brothers. 3 sisters. No one has cancer or blood problems that he knows of.  ALLERGIES:  has No Known Allergies.  MEDICATIONS:  Current Outpatient Prescriptions  Medication Sig Dispense Refill  . aspirin 325 MG tablet Take 325 mg by mouth daily.    . DULoxetine (CYMBALTA) 30 MG capsule Take 30 mg by mouth daily.    . fludrocortisone (FLORINEF) 0.1 MG tablet Take 1 tablet (0.1 mg total) by mouth daily. 30 tablet 3  . midodrine (PROAMATINE) 10 MG tablet Take 1 tablet (10 mg total) by mouth 3 (three) times daily. 90 tablet 6  . traMADol (ULTRAM) 50 MG tablet     . vitamin B-12 (CYANOCOBALAMIN) 1000 MCG tablet Take 1,000 mcg by mouth daily.    . Zinc 50 MG CAPS Take 50 mg by mouth every morning.     No current facility-administered medications for this visit.     Review of Systems  Constitutional: Negative for fever, chills, weight loss and malaise/fatigue.  HENT: Negative for congestion, hearing loss, nosebleeds, sore throat and tinnitus.   Eyes: Negative for blurred vision, double vision, pain and discharge.  Respiratory:  Negative for cough, hemoptysis, sputum production and wheezing.  Cardiovascular: Negative for chest pain, palpitations, claudication, leg swelling and PND.  Gastrointestinal: Positive for constipation. Negative for heartburn, nausea, vomiting, abdominal pain, diarrhea, blood in stool and melena.  Genitourinary: Negative  for dysuria, urgency, frequency and hematuria.  Musculoskeletal: Negative for myalgias, joint pain and falls.  Skin: Negative for itching and rash.  Neurological: Negative for dizziness, tingling, tremors, sensory change, speech change, focal weakness, seizures, loss of consciousness, weakness and headaches.  Endo/Heme/Allergies: Does not bruise/bleed easily.  Psychiatric/Behavioral: Negative for depression, suicidal ideas, memory loss and substance abuse. The patient is not nervous/anxious and does not have insomnia.    14 point ROS was done and is otherwise as detailed above or in HPI   PHYSICAL EXAMINATION:  ECOG PERFORMANCE STATUS: 0 - Asymptomatic  Vitals:   06/04/16 1036  BP: 119/64  Pulse: 82  Resp: 16  Temp: 98.7 F (37.1 C)   Filed Weights   06/04/16 1036  Weight: 179 lb 6.4 oz (81.4 kg)    Physical Exam  Constitutional: He is oriented to person, place, and time and well-developed, well-nourished, and in no distress. Ambulated to exam table relatively easily and without assistance. HENT:  Head: Normocephalic and atraumatic.  Nose: Nose normal.  Mouth/Throat: Oropharynx is  clear and moist. No oropharyngeal exudate.  Has dentures (for about 5 years). Eyes: Conjunctivae and EOM are normal. Pupils are equal, round, and reactive to light. Right eye exhibits no discharge. Left eye exhibits no discharge. No scleral icterus.  Neck: Normal range of motion. Neck supple. No tracheal deviation present. No thyromegaly present.  Cardiovascular: Normal rate, regular rhythm and normal heart sounds.  Exam reveals no gallop and no friction rub.   No murmur heard. Pulmonary/Chest: Effort normal and breath sounds normal. He has no wheezes. He has no rales.  Abdominal: Firm, nontender. Bowel sounds are normal. He exhibits no distension and no mass. There is no tenderness. There is no rebound and no guarding. No HSM Musculoskeletal: Normal range of motion. He exhibits no edema.    Lymphadenopathy:    He has no cervical adenopathy.  Neurological: He is alert and oriented to person, place, and time. He has normal reflexes. No cranial nerve deficit. Gait normal. Coordination normal.  Skin: Skin is warm and dry. No rash noted.  Psychiatric: Mood, memory, affect and judgment normal.  Nursing note and vitals reviewed.   LABORATORY DATA:  I have reviewed the data as listed Lab Results  Component Value Date   WBC 10.1 06/04/2016   HGB 12.6 (L) 06/04/2016   HCT 38.5 (L) 06/04/2016   MCV 69.2 (L) 06/04/2016   PLT 300 06/04/2016   Results for KAVAN, DEVAN (MRN [id number])   Ref. Range 10/25/2015 10:00 11/15/2015 11:40 11/15/2015 12:31 02/04/2016 09:21 06/04/2016 09:45  Hemoglobin Latest Ref Range: 13.0 - 17.0 g/dL 11.5 (L) 12.2 (L) 12.5 (L) 13.1 12.6 (L)    ASSESSMENT & PLAN:  Microcytic Anemia, labs on 07/30/2014 with hgb 12, hct 37.3 and mcv 70.4 Mildly low iron saturation at 19% Positive sickle cell screen Colonoscopy 11/12/2014 EGD 11/12/2014 Esophageal dilation 11/12/2014 Serum ferritin of 84NG/ML Borderline low B12 at 205 PG/mL, patient started on sublingual B12 good response Normal TSH No evidence of an M spike, normal Kappa lambda light chain ratio Normal sedimentation rate Leukocytosis Normal Flow Cytometry Lambert-Eaton Myasthenia Gravis Syndrome  70 year old male with microcytic anemia, positive sickle cell screen, (daughter with sickle cell anemia and son with sickle cell trait) normal iron studies and GI evaluation.  Unlike sickle cell disease, individuals with sickle cell trait have a normal hemoglobin level, hematocrit, reticulocyte count, red blood cell indices, and peripheral blood smear. I suspect his MG contributes to his anemia as well.   His B12 level is improved. He will continue on sublingual B12.  His overall laboratory studies are stable. He has had flow cytometry for his leukocytosis which is a predominant neutrophilia. If persistent or  worsens I would consider a BCR-ABL, note that the patient has never had a BMBX.    We will continue with ongoing observation seeing him back in 6 months with repeat labs. As detailed the patient will be notified of iron levels when they are available.  Patient will be notified when vitamin b12 and iron result.   I recommended Stephanos take a stool softner such as colace to alleviate constipation.  All questions were answered. The patient knows to call the clinic with any problems, questions or concerns.  This document serves as a record of services personally performed by Ancil Linsey, MD. It was created on her behalf by Elmyra Ricks, a trained medical scribe. The creation of this record is based on the scribe's personal observations and the provider's statements to them. This document has been  checked and approved by the attending provider.   This document serves as a record of services personally performed by Ancil Linsey, MD. It was created on her behalf by   This note was electronically signed.   Elmyra Ricks

## 2016-06-04 NOTE — Patient Instructions (Addendum)
[place] at [facility] Discharge Instructions  RECOMMENDATIONS MADE BY THE CONSULTANT AND ANY TEST RESULTS WILL BE SENT TO YOUR REFERRING PHYSICIAN.  You saw Dr.Penland today. Follow up in 6 months with lab work. See Amy at checkout for appointments.  Thank you for choosing [place] at [facility] to provide your oncology and hematology care.  To afford each patient quality time with our provider, please arrive at least 15 minutes before your scheduled appointment time.   Beginning January 23rd 2017 lab work for the [facility] will be done in the  Main lab at [facility] on 1st floor. If you have a lab appointment with the [place] please come in thru the  Main Entrance and check in at the main information desk  You need to re-schedule your appointment should you arrive 10 or more minutes late.  We strive to give you quality time with our providers, and arriving late affects you and other patients whose appointments are after yours.  Also, if you no show three or more times for appointments you may be dismissed from the clinic at the providers discretion.     Again, thank you for choosing [facility].  Our hope is that these requests will decrease the amount of time that you wait before being seen by our physicians.       _____________________________________________________________  Should you have questions after your visit to [facility], please contact our office at [phone] between the hours of 8:30 a.m. and 4:30 p.m.  Voicemails left after 4:30 p.m. will not be returned until the following business day.  For prescription refill requests, have your pharmacy contact our office.         Resources For Cancer Patients and their Caregivers ? American Cancer Society: Can assist with transportation, wigs, general needs, runs Look Good Feel Better.        930-680-2733 ? Cancer Care: Provides  financial assistance, online support groups, medication/co-pay assistance.  1-800-813-HOPE 754-728-1037) ? [facility] Assists [place] cancer patients and their families through emotional , educational and financial support.  (902) 337-0671 ? [place] DSS Where to apply for food stamps, Medicaid and utility assistance. (518)161-2588 ? RCATS: Transportation to medical appointments. (802)106-8035 ? Social Security Administration: May apply for disability if have a Stage IV cancer. 743-408-0711 301-397-7388 ? [employer], Disability and Transit Services: Assists with nutrition, care and transit needs. [facility] Support Programs: '@10RELATIVEDAYS'$ @ > Cancer Support Group  2nd Tuesday of the month 1pm-2pm, Journey Room  > Creative Journey  3rd Tuesday of the month 1130am-1pm, Journey Room  > Look Good Feel Better  1st Wednesday of the month 10am-12 noon, Journey Room (Call [place] to register [phone])

## 2016-06-11 ENCOUNTER — Encounter (HOSPITAL_COMMUNITY): Payer: Medicare Other | Attending: Hematology & Oncology

## 2016-06-11 ENCOUNTER — Encounter (HOSPITAL_COMMUNITY): Payer: Self-pay

## 2016-06-11 VITALS — BP 116/64 | HR 70 | Temp 97.9°F | Resp 16

## 2016-06-11 DIAGNOSIS — D509 Iron deficiency anemia, unspecified: Secondary | ICD-10-CM

## 2016-06-11 MED ORDER — SODIUM CHLORIDE 0.9 % IV SOLN
510.0000 mg | Freq: Once | INTRAVENOUS | Status: AC
  Administered 2016-06-11: 510 mg via INTRAVENOUS
  Filled 2016-06-11: qty 17

## 2016-06-11 MED ORDER — SODIUM CHLORIDE 0.9 % IV SOLN
Freq: Once | INTRAVENOUS | Status: AC
  Administered 2016-06-11: 14:00:00 via INTRAVENOUS

## 2016-06-11 NOTE — Patient Instructions (Signed)
[facility] at [facility] Discharge Instructions  RECOMMENDATIONS MADE BY THE CONSULTANT AND ANY TEST RESULTS WILL BE SENT TO YOUR REFERRING PHYSICIAN.  Iron infusion today. Return as scheduled for lab work and office visit.  Thank you for choosing [facility] at [facility] to provide your oncology and hematology care.  To afford each patient quality time with our provider, please arrive at least 15 minutes before your scheduled appointment time.   Beginning January 23rd 2017 lab work for the Cancer Center will be done in the  Main lab at [facility] on 1st floor. If you have a lab appointment with the Cancer Center please come in thru the  Main Entrance and check in at the main information desk  You need to re-schedule your appointment should you arrive 10 or more minutes late.  We strive to give you quality time with our providers, and arriving late affects you and other patients whose appointments are after yours.  Also, if you no show three or more times for appointments you may be dismissed from the clinic at the providers discretion.     Again, thank you for choosing [facility].  Our hope is that these requests will decrease the amount of time that you wait before being seen by our physicians.       _____________________________________________________________  Should you have questions after your visit to [facility], please contact our office at [phone] between the hours of 8:30 a.m. and 4:30 p.m.  Voicemails left after 4:30 p.m. will not be returned until the following business day.  For prescription refill requests, have your pharmacy contact our office.         Resources For Cancer Patients and their Caregivers ? American Cancer Society: Can assist with transportation, wigs, general needs, runs Look Good Feel Better.        1-888-227-6333 ? Cancer Care: Provides financial assistance, online  support groups, medication/co-pay assistance.  1-800-813-HOPE (4673) ? [facility] Assists [place] cancer patients and their families through emotional , educational and financial support.  336-427-4357 ? [place] DSS Where to apply for food stamps, Medicaid and utility assistance. 336-342-1394 ? RCATS: Transportation to medical appointments. 336-347-2287 ? Social Security Administration: May apply for disability if have a Stage IV cancer. 336-342-7796 1-800-772-1213 ? [place] Aging, Disability and Transit Services: Assists with nutrition, care and transit needs. 336-349-2343  Cancer Center Support Programs: @10RELATIVEDAYS@ > Cancer Support Group  2nd Tuesday of the month 1pm-2pm, Journey Room  > Creative Journey  3rd Tuesday of the month 1130am-1pm, Journey Room  > Look Good Feel Better  1st Wednesday of the month 10am-12 noon, Journey Room (Call American Cancer Society to register 1-800-395-5775)   

## 2016-06-11 NOTE — Progress Notes (Signed)
Tolerated infusion w/o adverse reaction.  Alert, in no distress.  VSS.  Discharged via wheelchair in c/o daughter.   

## 2016-06-30 DIAGNOSIS — M5137 Other intervertebral disc degeneration, lumbosacral region: Secondary | ICD-10-CM | POA: Diagnosis not present

## 2016-06-30 DIAGNOSIS — G894 Chronic pain syndrome: Secondary | ICD-10-CM | POA: Diagnosis not present

## 2016-06-30 DIAGNOSIS — M47817 Spondylosis without myelopathy or radiculopathy, lumbosacral region: Secondary | ICD-10-CM | POA: Diagnosis not present

## 2016-06-30 DIAGNOSIS — M47812 Spondylosis without myelopathy or radiculopathy, cervical region: Secondary | ICD-10-CM | POA: Diagnosis not present

## 2016-07-03 ENCOUNTER — Encounter (HOSPITAL_COMMUNITY): Payer: Self-pay | Admitting: Hematology & Oncology

## 2016-07-10 ENCOUNTER — Ambulatory Visit: Payer: Medicare Other | Admitting: Cardiology

## 2016-07-15 ENCOUNTER — Ambulatory Visit: Payer: Medicare Other | Admitting: Cardiology

## 2016-07-27 ENCOUNTER — Ambulatory Visit (INDEPENDENT_AMBULATORY_CARE_PROVIDER_SITE_OTHER): Payer: Medicare Other | Admitting: Cardiology

## 2016-07-27 ENCOUNTER — Encounter: Payer: Self-pay | Admitting: Cardiology

## 2016-07-27 VITALS — BP 108/70 | HR 88 | Ht 71.0 in | Wt 170.0 lb

## 2016-07-27 DIAGNOSIS — I951 Orthostatic hypotension: Secondary | ICD-10-CM

## 2016-07-27 DIAGNOSIS — Z136 Encounter for screening for cardiovascular disorders: Secondary | ICD-10-CM

## 2016-07-27 MED ORDER — FLUDROCORTISONE ACETATE 0.1 MG PO TABS: 0.2000 mg | ORAL_TABLET | Freq: Every day | ORAL | 6 refills | Status: DC

## 2016-07-27 MED ORDER — ABDOMINAL BINDER/ELASTIC MED MISC: 0 refills | Status: DC

## 2016-07-27 NOTE — Patient Instructions (Signed)
Medication Instructions:   Increase Florinef to 0.'2mg'$  daily.  Continue all other medications.    Labwork: none  Testing/Procedures:  Your physician has requested that you have an abdominal aorta duplex. During this test, an ultrasound is used to evaluate the aorta. Allow 30 minutes for this exam. Do not eat after midnight the day before and avoid carbonated beverages.  Office will contact with results via phone or letter.    Follow-Up: Your physician wants you to follow up in: 6 months.  You will receive a reminder letter in the mail one-two months in advance.  If you don't receive a letter, please call our office to schedule the follow up appointment   Any Other Special Instructions Will Be Listed Below (If Applicable).  If you need a refill on your cardiac medications before your next appointment, please call your pharmacy.

## 2016-07-27 NOTE — Progress Notes (Signed)
Clinical Summary Erik [name] is a 70 y.o.male  seen today for follow up of the following medical problems.   1. Orthostatic hypotension - long history. He had done well on midodrine and florinef in the past, however a few visits ago stopped taking them because he thought one may be causing his ability to taste. He did well for awhile but began having recurrent orthostatic symptoms - restarted on midodrine, on '5mg'$  tid still having symptoms.  - Not using compression stockings. Drinks heavy fluids, heavy salt intake.   - still with some dizziness since last visit though notes some improvement.   Past Medical History:  Diagnosis Date  . B12 deficiency 06/25/2015  . Chronic back pain   . COPD (chronic obstructive pulmonary disease) ([place])   . DDD (degenerative disc disease), lumbar   . GERD (gastroesophageal reflux disease)   . Iron deficiency anemia 06/04/2016  . LEMS (Lambert-Eaton myasthenic syndrome) ([place]) 06/25/2015  . Low blood pressure   . Microcytic anemia 06/25/2015  . Seizures ([place])    in remote past   . Shortness of breath dyspnea      No Known Allergies   Current Outpatient Prescriptions  Medication Sig Dispense Refill  . aspirin 325 MG tablet Take 325 mg by mouth daily.    . DULoxetine (CYMBALTA) 30 MG capsule Take 30 mg by mouth daily.    . fludrocortisone (FLORINEF) 0.1 MG tablet Take 1 tablet (0.1 mg total) by mouth daily. 30 tablet 3  . midodrine (PROAMATINE) 10 MG tablet Take 1 tablet (10 mg total) by mouth 3 (three) times daily. 90 tablet 6  . traMADol (ULTRAM) 50 MG tablet     . vitamin B-12 (CYANOCOBALAMIN) 1000 MCG tablet Take 1,000 mcg by mouth daily.    . Zinc 50 MG CAPS Take 50 mg by mouth every morning.     No current facility-administered medications for this visit.      Past Surgical History:  Procedure Laterality Date  . COLONOSCOPY N/A 11/12/2014   Dr. Clayburn Pert external and internal hemorrhoids, redundant left colon  . ESOPHAGEAL  DILATION N/A 11/12/2014   Procedure: ESOPHAGEAL DILATION;  Surgeon: Danie Binder, MD;  [facility];  Service: Endoscopy;  Laterality: N/A;  . ESOPHAGOGASTRODUODENOSCOPY N/A 11/12/2014   Dr. Fields:mild non-erosive gastritis. negative H.pylori   . None       No Known Allergies    Family History  Problem Relation Age of Onset  . Stomach cancer Maternal Grandfather   . Sickle cell anemia Daughter   . Colon cancer Neg Hx      Social History Erik [name] reports that he quit smoking about 32 years ago. His smoking use included Cigarettes. He started smoking about 53 years ago. He has a 10.50 pack-year smoking history. He has never used smokeless tobacco. Erik [name] reports that he does not drink alcohol.   Review of Systems CONSTITUTIONAL: No weight loss, fever, chills, weakness or fatigue.  HEENT: Eyes: No visual loss, blurred vision, double vision or yellow sclerae.No hearing loss, sneezing, congestion, runny nose or sore throat.  SKIN: No rash or itching.  CARDIOVASCULAR: no chest pain, no palpitations.  RESPIRATORY: No shortness of breath, cough or sputum.  GASTROINTESTINAL: No anorexia, nausea, vomiting or diarrhea. No abdominal pain or blood.  GENITOURINARY: No burning on urination, no polyuria NEUROLOGICAL: +dizziness MUSCULOSKELETAL: No muscle, back pain, joint pain or stiffness.  LYMPHATICS: No enlarged nodes. No history of splenectomy.  PSYCHIATRIC: No history of  depression or anxiety.  ENDOCRINOLOGIC: No reports of sweating, cold or heat intolerance. No polyuria or polydipsia.  Marland Kitchen   Physical Exmination Vitals:   07/27/16 1134  BP: 108/70  Pulse: 88   Vitals:   07/27/16 1134  Weight: 170 lb (77.1 kg)  Height: '5\' 11"'$  (1.803 m)    Gen: resting comfortably, no acute distress HEENT: no scleral icterus, pupils equal round and reactive, no palptable cervical adenopathy,  CV: RRR, no m/r/g, no jvd Resp: Clear to auscultation bilaterally GI: abdomen  is soft, non-tender, non-distended, normal bowel sounds, no hepatosplenomegaly MSK: extremities are warm, no edema.  Skin: warm, no rash Neuro:  no focal deficits Psych: appropriate affect   Diagnostic Studies 06/2014 echo Study Conclusions  - Left ventricle: The cavity size was normal. Wall thickness was normal. Systolic function was normal. The estimated ejection fraction was in the range of 55% to 60%. Wall motion was normal; there were no regional wall motion abnormalities. Doppler parameters are consistent with abnormal left ventricular relaxation (grade 1 diastolic dysfunction). - Aortic valve: Trileaflet; mildly thickened leaflets. - Mitral valve: Mildly thickened leaflets . There was trivial regurgitation. - Right atrium: Central venous pressure (est): 3 mm Hg. - Atrial septum: No defect or patent foramen ovale was identified. - Tricuspid valve: There was mild regurgitation. - Pulmonary arteries: PA peak pressure: 34 mm Hg (S). - Pericardium, extracardiac: There was no pericardial effusion.  Impressions:  - Normal LV wall thickness with LVEF 47-09%, grade 1 diastolic dysfunction. Mildly thickened aortic valve. Trivial mitral and mild regurgitation. PASP 34 mmHg.    Assessment and Plan  1. Orthostatic symptoms - ongoing symptoms. We will increase florinef to 0.'2mg'$  daily, continue midodrine '10mg'$  tid - order abdominal binder - asked to update [place] on symptoms in a few weeks by phone   2. AAA screen Male over 72 with smoking history, order AAA schreen     F/u 6 months       Arnoldo Lenis, M.D., F.A.C.C.

## 2016-08-12 ENCOUNTER — Other Ambulatory Visit: Payer: Self-pay | Admitting: Cardiology

## 2016-08-17 ENCOUNTER — Ambulatory Visit: Payer: Medicare Other | Admitting: Cardiology

## 2016-08-19 ENCOUNTER — Ambulatory Visit: Payer: Medicare Other

## 2016-08-19 DIAGNOSIS — Z136 Encounter for screening for cardiovascular disorders: Secondary | ICD-10-CM

## 2016-08-26 ENCOUNTER — Telehealth: Payer: Self-pay | Admitting: *Deleted

## 2016-08-26 NOTE — Telephone Encounter (Signed)
Pt aware - routed to pcp  

## 2016-08-26 NOTE — Telephone Encounter (Signed)
-----   Message from Arnoldo Lenis, MD sent at 08/24/2016  1:19 PM EST ----- Normal abdominial US, no aneurysm  JBranch MD

## 2016-09-18 ENCOUNTER — Encounter: Payer: Self-pay | Admitting: Neurology

## 2016-09-18 ENCOUNTER — Ambulatory Visit (INDEPENDENT_AMBULATORY_CARE_PROVIDER_SITE_OTHER): Payer: Medicare Other | Admitting: Neurology

## 2016-09-18 VITALS — Ht 72.0 in | Wt 178.0 lb

## 2016-09-18 DIAGNOSIS — M48061 Spinal stenosis, lumbar region without neurogenic claudication: Secondary | ICD-10-CM | POA: Diagnosis not present

## 2016-09-18 DIAGNOSIS — I951 Orthostatic hypotension: Secondary | ICD-10-CM

## 2016-09-18 DIAGNOSIS — G7080 Lambert-Eaton syndrome, unspecified: Secondary | ICD-10-CM

## 2016-09-18 NOTE — Progress Notes (Signed)
Note sent to PCP.

## 2016-09-18 NOTE — Patient Instructions (Signed)
1.  I will refer you to the neuromuscular clinic at [facility].  They have specialists who treat Lambert-Eaton Myasthenic Syndrome 2.  Follow up with hematology/oncology and pain management 3.  Follow up with me in 6 months.  Contact me sooner if needed.

## 2016-09-18 NOTE — Progress Notes (Signed)
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  He is accompanied by his daughter who supplements history.   UPDATE:  He reports that he is doing well. He denies double vision, trouble swallowing, slurred speech, trouble breathing, or weakness of the limbs. Back pain is still an ongoing issue.  He sees pain management. He is followed by hematology/oncology.  No primary source of malignancy has been found thus far.   HISTORY: He developed low back pain about a year ago.  There was no precipitating event.  He reports mid low back pain with diffuse pain radiating down both legs to the feet.  He describes it as a deep burning pain.  It occurs in all positions, either laying down, sitting or ambulating.  He notes some weakness in the legs as well.  He denies numbness.  For the past year, he uses a cane.  He denies bowel or bladder dysfunction but reports impotence over the past year.  He has orthostatic hypotension and takes Florinef and midodrine.  He notes mild neck pain.   MRI of the lumbar spine from 07/17/14 showed spondylosis and degenerative disc disease with disc bulge causing mild bilateral foraminal stenosis at L3-4 and disc bulge at L4-5 causing mild right bilateral foraminal stenosis and right greater than left facet arthropathy.   He saw a neurologist in [place].  NCV-EMG demonstrated demyelinating peripheral neuropathy.  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  CSF protein was 23.  Glucose was 60.  Cell count was 0.  Labs in May revealed CBC with chronic mild microcytic anemia, Sed Rate 18, Kappa free light chain 23.29, normal IgG, IgA and IgM, normal SPEP and IFE, B12 205.   He does report that he feels weaker in the mornings, and improves later in the day.   He is a former smoker.  As per EPIC chart, he has had a 14 lb weight loss over the past year, although he and his daughter report a 40 lb weight loss.  He has poor appetite, however.  A NCV-EMG was performed on 06/11/15.  Findings did not show peripheral neuropathy, but it did reveal post-exercise facilitation of motor responses, suggesting a pre-synaptic neuromuscular junction disorder.   Voltage-gated calcium channel antibodies were positive. CT of chest showed a 9 mm sub sold nodule within the right upper lobe.  CT of abdomen and pelvis was unremarkable. He is followed by oncology.  A paraneoplastic source has not been found at this time.  He is treated for iron deficiency anemia.  PAST MEDICAL HISTORY: Past Medical History:  Diagnosis Date  . B12 deficiency 06/25/2015  . Chronic back pain   . COPD (chronic obstructive pulmonary disease) ([place])   . DDD (degenerative disc disease), lumbar   . GERD (gastroesophageal reflux disease)   . Iron deficiency anemia 06/04/2016  . LEMS (Lambert-Eaton myasthenic syndrome) ([place]) 06/25/2015  . Low blood pressure   . Microcytic anemia 06/25/2015  . Seizures ([place])    in remote past   . Shortness of breath dyspnea     MEDICATIONS: Current Outpatient Prescriptions on File Prior to Visit  Medication Sig Dispense Refill  . acetaminophen (TYLENOL) 500 MG tablet Take 500 mg by mouth every 6 (six) hours as needed.    Marland Kitchen aspirin 325 MG tablet  Take 325 mg by mouth daily.    . fludrocortisone (FLORINEF) 0.1 MG tablet Take 2 tablets (0.2 mg total) by mouth daily. 60 tablet 6  . midodrine (PROAMATINE) 10 MG tablet Take 1 tablet (10 mg total) by mouth 3 (three) times daily. 90 tablet 6  . traMADol (ULTRAM) 50 MG tablet Take 50 mg by mouth as directed.     . trolamine salicylate (ASPERCREME) 10 % cream Apply 1 application topically as needed for muscle pain.    . vitamin B-12 (CYANOCOBALAMIN) 1000 MCG tablet Take 1,000 mcg by mouth daily.    . Zinc 50 MG CAPS  Take 50 mg by mouth every morning.     No current facility-administered medications on file prior to visit.     ALLERGIES: No Known Allergies  FAMILY HISTORY: Family History  Problem Relation Age of Onset  . Stomach cancer Maternal Grandfather   . Sickle cell anemia Daughter   . Colon cancer Neg Hx     SOCIAL HISTORY: Social History   Social History  . Marital status: Legally Separated    Spouse name: N/A  . Number of children: N/A  . Years of education: N/A   Occupational History  . Not on file.   Social History Main Topics  . Smoking status: Former Smoker    Packs/day: 0.50    Years: 21.00    Types: Cigarettes    Start date: 01/22/1963    Quit date: 08/11/1983  . Smokeless tobacco: Never Used  . Alcohol use No     Comment: remote past, history of ETOH abuse.   . Drug use: No  . Sexual activity: Not on file   Other Topics Concern  . Not on file   Social History Narrative  . No narrative on file    REVIEW OF SYSTEMS: Constitutional: No fevers, chills, or sweats, no generalized fatigue, change in appetite Eyes: No visual changes, double vision, eye pain Ear, nose and throat: No hearing loss, ear pain, nasal congestion, sore throat Cardiovascular: No chest pain, palpitations Respiratory:  No shortness of breath at rest or with exertion, wheezes GastrointestinaI: No nausea, vomiting, diarrhea, abdominal pain, fecal incontinence Genitourinary:  No dysuria, urinary retention or frequency Musculoskeletal:  Back pain Integumentary: No rash, pruritus, skin lesions Neurological: as above Psychiatric: No depression, insomnia, anxiety Endocrine: No palpitations, fatigue, diaphoresis, mood swings, change in appetite, change in weight, increased thirst Hematologic/Lymphatic:  No purpura, petechiae. Allergic/Immunologic: no itchy/runny eyes, nasal congestion, recent allergic reactions, rashes  PHYSICAL EXAM: Vitals:   General: No acute distress.  Patient appears  well-groomed.  Head:  Normocephalic/atraumatic Eyes:  Fundi examined but not visualized Neck: supple, no paraspinal tenderness, full range of motion Heart:  Regular rate and rhythm Lungs:  Clear to auscultation bilaterally Back: mild paraspinal tenderness Neurological Exam: alert and oriented to person, place, and time. Attention span and concentration intact, recent and remote memory intact, fund of knowledge intact.  Speech fluent and not dysarthric, language intact.  CN II-XII intact. Bulk and tone normal, muscle strength 5/5 throughout.  Sensation to light touch  intact.  Deep tendon reflexes 2+ throughout.  Finger to nose testing intact.  Antalgic gait.  IMPRESSION: 1. Lambert-Eaton Myasthenic Syndrome.  He is asymptomatic. 2.  Lumbar stenosis 3.  Orthostatic hypotension  PLAN: 1.  I will refer him to the neuromuscular clinic at [facility] regarding LEMS. 2.  He will follow up with Heme/Onc to monitor for malignancy 3.  Midodrine and Florinef as per cardiology 4.  Follow up in 6 months.  21 minutes spent face to face with patient, over 50% spent discussing diagnosis and plan.  Metta Clines, DO  CC:  [name], MD

## 2016-09-28 DIAGNOSIS — M47812 Spondylosis without myelopathy or radiculopathy, cervical region: Secondary | ICD-10-CM | POA: Diagnosis not present

## 2016-09-28 DIAGNOSIS — G894 Chronic pain syndrome: Secondary | ICD-10-CM | POA: Diagnosis not present

## 2016-09-28 DIAGNOSIS — M47817 Spondylosis without myelopathy or radiculopathy, lumbosacral region: Secondary | ICD-10-CM | POA: Diagnosis not present

## 2016-09-28 DIAGNOSIS — M5137 Other intervertebral disc degeneration, lumbosacral region: Secondary | ICD-10-CM | POA: Diagnosis not present

## 2016-11-10 ENCOUNTER — Other Ambulatory Visit: Payer: Self-pay | Admitting: Cardiology

## 2016-11-10 ENCOUNTER — Encounter: Payer: Self-pay | Admitting: Gastroenterology

## 2016-12-03 ENCOUNTER — Encounter (HOSPITAL_COMMUNITY): Payer: Self-pay

## 2016-12-03 ENCOUNTER — Encounter (HOSPITAL_COMMUNITY): Payer: Medicare Other | Attending: Hematology & Oncology | Admitting: Oncology

## 2016-12-03 ENCOUNTER — Encounter (HOSPITAL_COMMUNITY): Payer: Medicare Other

## 2016-12-03 VITALS — BP 148/90 | HR 74 | Temp 98.3°F | Resp 18 | Wt 183.9 lb

## 2016-12-03 DIAGNOSIS — D72829 Elevated white blood cell count, unspecified: Secondary | ICD-10-CM | POA: Diagnosis not present

## 2016-12-03 DIAGNOSIS — E538 Deficiency of other specified B group vitamins: Secondary | ICD-10-CM

## 2016-12-03 DIAGNOSIS — D508 Other iron deficiency anemias: Secondary | ICD-10-CM

## 2016-12-03 DIAGNOSIS — D509 Iron deficiency anemia, unspecified: Secondary | ICD-10-CM | POA: Insufficient documentation

## 2016-12-03 LAB — CBC WITH DIFFERENTIAL/PLATELET
BASOS ABS: 0.1 10*3/uL (ref 0.0–0.1)
Basophils Relative: 1 %
Eosinophils Absolute: 0.8 10*3/uL — ABNORMAL HIGH (ref 0.0–0.7)
Eosinophils Relative: 7 %
HCT: 40.5 % (ref 39.0–52.0)
Hemoglobin: 13.1 g/dL (ref 13.0–17.0)
LYMPHS PCT: 19 %
Lymphs Abs: 2.2 10*3/uL (ref 0.7–4.0)
MCH: 22.8 pg — ABNORMAL LOW (ref 26.0–34.0)
MCHC: 32.3 g/dL (ref 30.0–36.0)
MCV: 70.6 fL — AB (ref 78.0–100.0)
Monocytes Absolute: 1 10*3/uL (ref 0.1–1.0)
Monocytes Relative: 8 %
NEUTROS ABS: 8 10*3/uL — AB (ref 1.7–7.7)
Neutrophils Relative %: 65 %
PLATELETS: 280 10*3/uL (ref 150–400)
RBC: 5.74 MIL/uL (ref 4.22–5.81)
RDW: 15.3 % (ref 11.5–15.5)
WBC: 12.1 10*3/uL — AB (ref 4.0–10.5)

## 2016-12-03 LAB — COMPREHENSIVE METABOLIC PANEL
ALT: 25 U/L (ref 17–63)
AST: 32 U/L (ref 15–41)
Albumin: 4.1 g/dL (ref 3.5–5.0)
Alkaline Phosphatase: 59 U/L (ref 38–126)
Anion gap: 8 (ref 5–15)
BUN: 11 mg/dL (ref 6–20)
CHLORIDE: 98 mmol/L — AB (ref 101–111)
CO2: 31 mmol/L (ref 22–32)
CREATININE: 1.25 mg/dL — AB (ref 0.61–1.24)
Calcium: 9.4 mg/dL (ref 8.9–10.3)
GFR calc Af Amer: >60 mL/min (ref >60)
GFR calc non Af Amer: 56 mL/min — ABNORMAL LOW (ref >60)
GLUCOSE: 104 mg/dL — AB (ref 65–99)
Potassium: 3.5 mmol/L (ref 3.5–5.1)
Sodium: 137 mmol/L (ref 135–145)
Total Bilirubin: 0.6 mg/dL (ref 0.3–1.2)
Total Protein: 7.6 g/dL (ref 6.5–8.1)

## 2016-12-03 LAB — IRON AND TIBC
Iron: 60 ug/dL (ref 45–182)
SATURATION RATIOS: 16 % — AB (ref 17.9–39.5)
TIBC: 364 ug/dL (ref 250–450)
UIBC: 304 ug/dL

## 2016-12-03 LAB — FOLATE: Folate: 36.3 ng/mL (ref >5.9)

## 2016-12-03 LAB — VITAMIN B12: Vitamin B-12: 1477 pg/mL — ABNORMAL HIGH (ref 180–914)

## 2016-12-03 LAB — FERRITIN: Ferritin: 85 ng/mL (ref 24–336)

## 2016-12-03 NOTE — Progress Notes (Signed)
[name] [facility] Progress Note  Patient Care Team: [name], MD as PCP - General (Nurse Practitioner) Danie Binder, MD as Consulting Physician (Gastroenterology)  CHIEF COMPLAINTS/PURPOSE OF CONSULTATION:  Microcytic Anemia, labs on 07/30/2014 with hgb 12, hct 37.3 and mcv 70.4 Normal iron studies Positive sickle cell screen Normal serum immunofixation Colonoscopy 11/12/2014 EGD 11/12/2014 Esophageal dilation 11/12/2014 Leukocytosis B12 deficiency Erik [name] Myasthenia Gravis  HISTORY OF PRESENTING ILLNESS:  Erik [name] 71 y.o. male is here for follow-up of microcytic anemia.   He is doing well today. He has some weakness in his legs currently, but otherwise his myasthenia has been stable. He lives alone, but his daughter lives next door to him. Denies chest pain, SOB, abdominal pain, weight loss, loss of appetite, recent infections, or any other concerns.   MEDICAL HISTORY:  Past Medical History:  Diagnosis Date  . B12 deficiency 06/25/2015  . Chronic back pain   . COPD (chronic obstructive pulmonary disease) ([place])   . DDD (degenerative disc disease), lumbar   . GERD (gastroesophageal reflux disease)   . Iron deficiency anemia 06/04/2016  . LEMS (Lambert-Eaton myasthenic syndrome) ([place]) 06/25/2015  . Low blood pressure   . Microcytic anemia 06/25/2015  . Seizures ([place])    in remote past   . Shortness of breath dyspnea     SURGICAL HISTORY: Past Surgical History:  Procedure Laterality Date  . COLONOSCOPY N/A 11/12/2014   Dr. Clayburn Pert external and internal hemorrhoids, redundant left colon  . ESOPHAGEAL DILATION N/A 11/12/2014   Procedure: ESOPHAGEAL DILATION;  Surgeon: Danie Binder, MD;  Location: [facility];  Service: Endoscopy;  Laterality: N/A;  . ESOPHAGOGASTRODUODENOSCOPY N/A 11/12/2014   Dr. Fields:mild non-erosive gastritis. negative H.pylori   . None      SOCIAL HISTORY: Social History   Social History  .  Marital status: Legally Separated    Spouse name: N/A  . Number of children: N/A  . Years of education: N/A   Occupational History  . Not on file.   Social History Main Topics  . Smoking status: Former Smoker    Packs/day: 0.50    Years: 21.00    Types: Cigarettes    Start date: 01/22/1963    Quit date: 08/11/1983  . Smokeless tobacco: Never Used  . Alcohol use No     Comment: remote past, history of ETOH abuse.   . Drug use: No  . Sexual activity: Not on file   Other Topics Concern  . Not on file   Social History Narrative  . No narrative on file   Separated. 2 children. 1 boy and 1 girl. Quit smoking in the 80's. Quit drinking in the 80's. 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   FAMILY HISTORY: Family History  Problem Relation Age of Onset  . Stomach cancer Maternal Grandfather   . Sickle cell anemia Daughter   . Colon cancer Neg Hx    indicated that the status of his maternal grandfather is unknown. He indicated that the status of his daughter is unknown. He indicated that the status of his neg hx is unknown.    Daughter has sickle cell anemia. Son has sickle cell trait. Mother deceased 21-60 yo, Alzeheimers. Father deceased close to 31 yo. Unsure what he died from. 2 step brothers. 3 sisters. No one has cancer or blood problems that he knows of.  ALLERGIES:  has No Known Allergies.  MEDICATIONS:  Current Outpatient  Prescriptions  Medication Sig Dispense Refill  . acetaminophen (TYLENOL) 500 MG tablet Take 500 mg by mouth every 6 (six) hours as needed.    Marland Kitchen aspirin 325 MG tablet Take 325 mg by mouth daily.    . DULoxetine (CYMBALTA) 30 MG capsule Take 30 mg by mouth daily.    . fludrocortisone (FLORINEF) 0.1 MG tablet Take 2 tablets (0.2 mg total) by mouth daily. 60 tablet 6  . midodrine (PROAMATINE) 10 MG tablet TAKE ONE TABLET BY MOUTH THREE TIMES DAILY 270 tablet 3  . traMADol (ULTRAM) 50 MG tablet Take 50 mg by mouth as directed.     .  trolamine salicylate (ASPERCREME) 10 % cream Apply 1 application topically as needed for muscle pain.    . vitamin B-12 (CYANOCOBALAMIN) 1000 MCG tablet Take 1,000 mcg by mouth daily.    . Zinc 50 MG CAPS Take 50 mg by mouth every morning.     No current facility-administered medications for this visit.    Review of Systems  Constitutional: Negative for weight loss.       No loss of appetite No recent infections  HENT: Negative.   Eyes: Negative.   Respiratory: Negative.  Negative for shortness of breath.   Cardiovascular: Negative.  Negative for chest pain.  Gastrointestinal: Negative.  Negative for abdominal pain.  Genitourinary: Negative.   Musculoskeletal: Negative.   Skin: Negative.   Neurological: Positive for weakness (legs).  Endo/Heme/Allergies: Negative.   Psychiatric/Behavioral: Negative.   All other systems reviewed and are negative. 14 point ROS was done and is otherwise as detailed above or in HPI  PHYSICAL EXAMINATION: ECOG PERFORMANCE STATUS: 0 - Asymptomatic  Vitals:   12/03/16 1054  BP: (!) 148/90  Pulse: 74  Resp: 18  Temp: 98.3 F (36.8 C)   Filed Weights   12/03/16 1054  Weight: 183 lb 14.4 oz (83.4 kg)   Physical Exam  Constitutional: He is oriented to person, place, and time and well-developed, well-nourished, and in no distress.  HENT:  Head: Normocephalic and atraumatic.  Mouth/Throat: Oropharynx is clear and moist.  Eyes: Conjunctivae and EOM are normal. Pupils are equal, round, and reactive to light.  Neck: Normal range of motion. Neck supple.  Cardiovascular: Normal rate, regular rhythm and normal heart sounds.   Pulmonary/Chest: Effort normal and breath sounds normal.  Abdominal: Soft. Bowel sounds are normal.  Musculoskeletal: Normal range of motion.  Neurological: He is alert and oriented to person, place, and time. Gait normal.  Skin: Skin is warm and dry.  Nursing note and vitals reviewed.  LABORATORY DATA:  I have reviewed the  data as listed Lab Results  Component Value Date   WBC 12.1 (H) 12/03/2016   HGB 13.1 12/03/2016   HCT 40.5 12/03/2016   MCV 70.6 (L) 12/03/2016   PLT 280 12/03/2016   CMP Latest Ref Rng & Units 12/03/2016 06/04/2016 02/04/2016  Glucose 65 - 99 mg/dL 104(H) 110(H) 105(H)  BUN 6 - 20 mg/dL '11 15 19  '$ Creatinine 0.61 - 1.24 mg/dL 1.25(H) 1.22 1.07  Sodium 135 - 145 mmol/L 137 138 136  Potassium 3.5 - 5.1 mmol/L 3.5 3.7 3.8  Chloride 101 - 111 mmol/L 98(L) 101 101  CO2 22 - 32 mmol/L '31 31 28  '$ Calcium 8.9 - 10.3 mg/dL 9.4 9.4 9.0  Total Protein 6.5 - 8.1 g/dL 7.6 - 7.7  Total Bilirubin 0.3 - 1.2 mg/dL 0.6 - 1.0  Alkaline Phos 38 - 126 U/L 59 - 59  AST  15 - 41 U/L 32 - 20  ALT 17 - 63 U/L 25 - 12(L)  Results for SILER, MAVIS (MRN [id number]) as of 12/03/2016 15:14  Ref. Range 12/03/2016 10:07 12/03/2016 10:08  Iron Latest Ref Range: 45 - 182 ug/dL 60   UIBC Latest Units: ug/dL 304   TIBC Latest Ref Range: 250 - 450 ug/dL 364   Saturation Ratios Latest Ref Range: 17.9 - 39.5 % 16 (L)   Ferritin Latest Ref Range: 24 - 336 ng/mL 85   Folate Latest Ref Range: >5.9 ng/mL  36.3  Vitamin B12 Latest Ref Range: 180 - 914 pg/mL 1,477 (H)    ASSESSMENT & PLAN:  Microcytic Anemia h/o b12 deficiency  71 year old male with microcytic anemia, positive sickle cell screen, (daughter with sickle cell anemia and son with sickle cell trait) normal iron studies and GI evaluation.  PLAN: I have reviewed labs with the patient today. He is doing well. Hemoglobin 13.1 g/dL. He has normal iron, b12, folate levels.  Continue daily B12 supplements.   He will return for follow up in 6 months with labs.   Orders Placed This Encounter  Procedures  . CBC with Differential    Standing Status:   Future    Standing Expiration Date:   08/04/2017  . Comprehensive metabolic panel    Standing Status:   Future    Standing Expiration Date:   08/04/2017  . Ferritin    Standing Status:   Future    Standing  Expiration Date:   08/04/2017  . Iron and TIBC    Standing Status:   Future    Standing Expiration Date:   08/04/2017  . Vitamin B12    Standing Status:   Future    Standing Expiration Date:   08/04/2017    All questions were answered. The patient knows to call the clinic with any problems, questions or concerns.  This document serves as a record of services personally performed by Twana First, MD. It was created on her behalf by Martinique Casey, a trained medical scribe. The creation of this record is based on the scribe's personal observations and the provider's statements to them. This document has been checked and approved by the attending provider.  I have reviewed the above documentation for accuracy and completeness and I agree with the above.  This note was electronically signed.   Martinique M Casey

## 2016-12-03 NOTE — Patient Instructions (Signed)
[facility] at [facility] Discharge Instructions  RECOMMENDATIONS MADE BY THE CONSULTANT AND ANY TEST RESULTS WILL BE SENT TO YOUR REFERRING PHYSICIAN.  You were seen today by Dr. Louise Zhou Follow up in 6 months with lab work   Thank you for choosing [facility] at [facility] to provide your oncology and hematology care.  To afford each patient quality time with our provider, please arrive at least 15 minutes before your scheduled appointment time.    If you have a lab appointment with the Cancer Center please come in thru the  Main Entrance and check in at the main information desk  You need to re-schedule your appointment should you arrive 10 or more minutes late.  We strive to give you quality time with our providers, and arriving late affects you and other patients whose appointments are after yours.  Also, if you no show three or more times for appointments you may be dismissed from the clinic at the providers discretion.     Again, thank you for choosing [facility].  Our hope is that these requests will decrease the amount of time that you wait before being seen by our physicians.       _____________________________________________________________  Should you have questions after your visit to [facility], please contact our office at [phone] between the hours of 8:30 a.m. and 4:30 p.m.  Voicemails left after 4:30 p.m. will not be returned until the following business day.  For prescription refill requests, have your pharmacy contact our office.       Resources For Cancer Patients and their Caregivers ? American Cancer Society: Can assist with transportation, wigs, general needs, runs Look Good Feel Better.        1-888-227-6333 ? Cancer Care: Provides financial assistance, online support groups, medication/co-pay assistance.  1-800-813-HOPE (4673) ? [facility] Assists  [place] cancer patients and their families through emotional , educational and financial support.  336-427-4357 ? [place] DSS Where to apply for food stamps, Medicaid and utility assistance. 336-342-1394 ? [facility]: Transportation to medical appointments. 336-347-2287 ? Social Security Administration: May apply for disability if have a Stage IV cancer. 336-342-7796 1-800-772-1213 ? [place] Aging, Disability and Transit Services: Assists with nutrition, care and transit needs. 336-349-2343  Cancer Center Support Programs: @10RELATIVEDAYS@ > Cancer Support Group  2nd Tuesday of the month 1pm-2pm, Journey Room  > Creative Journey  3rd Tuesday of the month 1130am-1pm, Journey Room  > Look Good Feel Better  1st Wednesday of the month 10am-12 noon, Journey Room (Call American Cancer Society to register 1-800-395-5775)    

## 2016-12-25 ENCOUNTER — Ambulatory Visit (INDEPENDENT_AMBULATORY_CARE_PROVIDER_SITE_OTHER): Payer: Medicare Other | Admitting: Cardiology

## 2016-12-25 ENCOUNTER — Encounter: Payer: Self-pay | Admitting: Cardiology

## 2016-12-25 VITALS — BP 157/74 | HR 81 | Ht 72.0 in | Wt 194.6 lb

## 2016-12-25 DIAGNOSIS — I951 Orthostatic hypotension: Secondary | ICD-10-CM | POA: Diagnosis not present

## 2016-12-25 DIAGNOSIS — R600 Localized edema: Secondary | ICD-10-CM

## 2016-12-25 MED ORDER — FLUDROCORTISONE ACETATE 0.1 MG PO TABS: 0.1500 mg | ORAL_TABLET | Freq: Every day | ORAL | Status: DC

## 2016-12-25 NOTE — Patient Instructions (Addendum)
Medication Instructions:   Decrease Florinef to 1 1/2 tabs daily.  Continue all other medications.    Labwork: none  Testing/Procedures: none  Follow-Up: 3 months   Any Other Special Instructions Will Be Listed Below (If Applicable). Please call the office in 1 week to give update on your weights.  If you need a refill on your cardiac medications before your next appointment, please call your pharmacy.

## 2016-12-25 NOTE — Progress Notes (Signed)
Clinical Summary Erik [name] is a 71 y.o.male seen today for follow up of the following medical problems.   1. Orthostatic hypotension - denies any recent dizziness - notes significant weight gain and edema over the last few weeks. We had increased his florinef at a prior visit.   Past Medical History:  Diagnosis Date  . B12 deficiency 06/25/2015  . Chronic back pain   . COPD (chronic obstructive pulmonary disease) ([place])   . DDD (degenerative disc disease), lumbar   . GERD (gastroesophageal reflux disease)   . Iron deficiency anemia 06/04/2016  . LEMS (Lambert-Eaton myasthenic syndrome) ([place]) 06/25/2015  . Low blood pressure   . Microcytic anemia 06/25/2015  . Seizures ([place])    in remote past   . Shortness of breath dyspnea      No Known Allergies   Current Outpatient Prescriptions  Medication Sig Dispense Refill  . acetaminophen (TYLENOL) 500 MG tablet Take 500 mg by mouth every 6 (six) hours as needed.    Marland Kitchen aspirin 325 MG tablet Take 325 mg by mouth daily.    . DULoxetine (CYMBALTA) 30 MG capsule Take 30 mg by mouth daily.    . fludrocortisone (FLORINEF) 0.1 MG tablet Take 2 tablets (0.2 mg total) by mouth daily. 60 tablet 6  . midodrine (PROAMATINE) 10 MG tablet TAKE ONE TABLET BY MOUTH THREE TIMES DAILY 270 tablet 3  . trolamine salicylate (ASPERCREME) 10 % cream Apply 1 application topically as needed for muscle pain.    . vitamin B-12 (CYANOCOBALAMIN) 1000 MCG tablet Take 1,000 mcg by mouth daily.    . Zinc 50 MG CAPS Take 50 mg by mouth every morning.     No current facility-administered medications for this visit.      Past Surgical History:  Procedure Laterality Date  . COLONOSCOPY N/A 11/12/2014   Dr. Clayburn Pert external and internal hemorrhoids, redundant left colon  . ESOPHAGEAL DILATION N/A 11/12/2014   Procedure: ESOPHAGEAL DILATION;  Surgeon: Danie Binder, MD;  [facility];  Service: Endoscopy;  Laterality: N/A;  .  ESOPHAGOGASTRODUODENOSCOPY N/A 11/12/2014   Dr. Fields:mild non-erosive gastritis. negative H.pylori   . None       No Known Allergies    Family History  Problem Relation Age of Onset  . Stomach cancer Maternal Grandfather   . Sickle cell anemia Daughter   . Colon cancer Neg Hx      Social History Mr. Regala reports that he quit smoking about 33 years ago. His smoking use included Cigarettes. He started smoking about 53 years ago. He has a 10.50 pack-year smoking history. He has never used smokeless tobacco. Mr. Marinaro reports that he does not drink alcohol.   Review of Systems CONSTITUTIONAL: No weight loss, fever, chills, weakness or fatigue.  HEENT: Eyes: No visual loss, blurred vision, double vision or yellow sclerae.No hearing loss, sneezing, congestion, runny nose or sore throat.  SKIN: No rash or itching.  CARDIOVASCULAR: no chest pain, no palpitations RESPIRATORY: No shortness of breath, cough or sputum.  GASTROINTESTINAL: No anorexia, nausea, vomiting or diarrhea. No abdominal pain or blood.  GENITOURINARY: No burning on urination, no polyuria NEUROLOGICAL: No headache, dizziness, syncope, paralysis, ataxia, numbness or tingling in the extremities. No change in bowel or bladder control.  MUSCULOSKELETAL: No muscle, back pain, joint pain or stiffness.  LYMPHATICS: No enlarged nodes. No history of splenectomy.  PSYCHIATRIC: No history of depression or anxiety.  ENDOCRINOLOGIC: No reports of sweating, cold or heat  intolerance. No polyuria or polydipsia.  Marland Kitchen   Physical Examination Vitals:   12/25/16 0856  BP: (!) 157/74  Pulse: 81   Vitals:   12/25/16 0856  Weight: 194 lb 9.6 oz (88.3 kg)  Height: 6' (1.829 m)    Gen: resting comfortably, no acute distress HEENT: no scleral icterus, pupils equal round and reactive, no palptable cervical adenopathy,  CV: RRR, no m/r/g, no jvd Resp: Clear to auscultation bilaterally GI: abdomen is soft, non-tender,  non-distended, normal bowel sounds, no hepatosplenomegaly MSK: extremities are warm, 1+ bilaeral LE edema  Skin: warm, no rash Neuro:  no focal deficits Psych: appropriate affect   Diagnostic Studies 06/2014 echo Study Conclusions  - Left ventricle: The cavity size was normal. Wall thickness was normal. Systolic function was normal. The estimated ejection fraction was in the range of 55% to 60%. Wall motion was normal; there were no regional wall motion abnormalities. Doppler parameters are consistent with abnormal left ventricular relaxation (grade 1 diastolic dysfunction). - Aortic valve: Trileaflet; mildly thickened leaflets. - Mitral valve: Mildly thickened leaflets . There was trivial regurgitation. - Right atrium: Central venous pressure (est): 3 mm Hg. - Atrial septum: No defect or patent foramen ovale was identified. - Tricuspid valve: There was mild regurgitation. - Pulmonary arteries: PA peak pressure: 34 mm Hg (S). - Pericardium, extracardiac: There was no pericardial effusion.  Impressions:  - Normal LV wall thickness with LVEF 86-85%, grade 1 diastolic dysfunction. Mildly thickened aortic valve. Trivial mitral and mild regurgitation. PASP 34 mmHg.   Jan 2018 [facility] [place] No aneurysm  Assessment and Plan  1. Orthostatiic hypotension - no recent dizziness or syncope - significant weight gain and edema likely related to prior increase in florinef - decrease florinef to 0.'15mg'$  daily - he will update [place] on weights in a few weeks     F/u1 month  Arnoldo Lenis, M.D.

## 2016-12-31 DIAGNOSIS — I517 Cardiomegaly: Secondary | ICD-10-CM | POA: Diagnosis not present

## 2016-12-31 DIAGNOSIS — Z87891 Personal history of nicotine dependence: Secondary | ICD-10-CM | POA: Diagnosis not present

## 2016-12-31 DIAGNOSIS — Z79899 Other long term (current) drug therapy: Secondary | ICD-10-CM | POA: Diagnosis not present

## 2016-12-31 DIAGNOSIS — G7080 Lambert-Eaton syndrome, unspecified: Secondary | ICD-10-CM | POA: Diagnosis not present

## 2016-12-31 DIAGNOSIS — I444 Left anterior fascicular block: Secondary | ICD-10-CM | POA: Diagnosis not present

## 2017-01-21 ENCOUNTER — Ambulatory Visit (INDEPENDENT_AMBULATORY_CARE_PROVIDER_SITE_OTHER): Payer: Medicare Other | Admitting: Nurse Practitioner

## 2017-01-21 ENCOUNTER — Encounter: Payer: Self-pay | Admitting: Nurse Practitioner

## 2017-01-21 VITALS — BP 137/74 | HR 87 | Temp 98.2°F | Ht 72.0 in | Wt 193.6 lb

## 2017-01-21 DIAGNOSIS — D509 Iron deficiency anemia, unspecified: Secondary | ICD-10-CM

## 2017-01-21 NOTE — Assessment & Plan Note (Signed)
Noted anemia which seems to be very well managed by hematology with his last hemoglobin of 13.1. Does have some fatigue and weakness, but he has been recently diagnosed with a myasthenic syndrome and is been seen at [facility] for this. Weakness is probably multifactorial nature. Has some dyspnea with exertion when he goes walking. Denies chest pain. Overall, he states he is feeling quite well. We will have him continue to follow up with hematology for anemia management. Return for follow-up in one year.

## 2017-01-21 NOTE — Progress Notes (Signed)
CC'ED TO PCP 

## 2017-01-21 NOTE — Patient Instructions (Signed)
1. Continue to see hematology for your anemia. 2. Return for follow-up here in one year. 3. Call us if you see any obvious bleeding when you go to the bathroom.

## 2017-01-21 NOTE — Progress Notes (Signed)
Referring Provider: [name], MD Primary Care Physician:  [name], MD Primary GI:  Dr. Oneida Alar  Chief Complaint  Patient presents with  . Anemia    HPI:   Erik [name] is a 71 y.o. male who presents for follow-up. The patient was last seen in our office 01/07/2016 for microcytic anemia and weight loss. Noted history of weight loss and lack of appetite, EGD and colonoscopy unrevealing, CT without significant findings. Anemia followed by hematology. Followed by neurology for Lambert-Eaton myasthenic syndrome. Weight stable compared to 2016. Continued back pain, plan to see pain management. No other GI symptoms. Weight loss team likely multifactorial, consider capsule of worsening anemia, otherwise follow-up in one year.  Last labs completed 12/03/2016 with a CBC showing a normal hemoglobin at 13.1, normal ferritin 85, normal iron and total iron binding capacity but low saturation at 16%.  Today he states he feels good. Denies hematochezia, melena, abdominal pain, N/V. Admits ongoing fatigue and weakness in his legs, both of which are primarily when he walks. Has some dyspnea when he walks which is no worse than normal. Denies chest pain, dizziness, lightheadedness, syncope, near syncope. Denies any other upper or lower GI symptoms.  Past Medical History:  Diagnosis Date  . B12 deficiency 06/25/2015  . Chronic back pain   . COPD (chronic obstructive pulmonary disease) ([place])   . DDD (degenerative disc disease), lumbar   . GERD (gastroesophageal reflux disease)   . Iron deficiency anemia 06/04/2016  . 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 Low blood pressure   . Microcytic anemia 06/25/2015  . Seizures ([place])    in remote past   . Shortness of breath dyspnea     Past Surgical History:  Procedure Laterality Date  . COLONOSCOPY N/A 11/12/2014   Dr. Clayburn Pert external and internal hemorrhoids, redundant left colon  . ESOPHAGEAL  DILATION N/A 11/12/2014   Procedure: ESOPHAGEAL DILATION;  Surgeon: Danie Binder, MD;  [facility];  Service: Endoscopy;  Laterality: N/A;  . ESOPHAGOGASTRODUODENOSCOPY N/A 11/12/2014   Dr. Fields:mild non-erosive gastritis. negative H.pylori   . None      Current Outpatient Prescriptions  Medication Sig Dispense Refill  . acetaminophen (TYLENOL) 500 MG tablet Take 500 mg by mouth every 6 (six) hours as needed.    Marland Kitchen aspirin 325 MG tablet Take 325 mg by mouth daily.    . DULoxetine (CYMBALTA) 20 MG capsule 40 mg daily.     . fludrocortisone (FLORINEF) 0.1 MG tablet Take 1.5 tablets (0.15 mg total) by mouth daily.    . midodrine (PROAMATINE) 10 MG tablet TAKE ONE TABLET BY MOUTH THREE TIMES DAILY 270 tablet 3  . trolamine salicylate (ASPERCREME) 10 % cream Apply 1 application topically as needed for muscle pain.    . vitamin B-12 (CYANOCOBALAMIN) 1000 MCG tablet Take 1,000 mcg by mouth daily.    . Zinc 50 MG CAPS Take 50 mg by mouth every morning.     No current facility-administered medications for this visit.     Allergies as of 01/21/2017  . (No Known Allergies)    Family History  Problem Relation Age of Onset  . Stomach cancer Maternal Grandfather   . Sickle cell anemia Daughter   . Colon cancer Neg Hx     Social History   Social History  . Marital status: Legally Separated    Spouse name: N/A  . Number of children: N/A  . Years of education: N/A  Social History Main Topics  . Smoking status: Former Smoker    Packs/day: 0.50    Years: 21.00    Types: Cigarettes    Start date: 01/22/1963    Quit date: 08/11/1983  . Smokeless tobacco: Never Used  . Alcohol use No     Comment: remote past, history of ETOH abuse.   . Drug use: No  . Sexual activity: Not Asked   Other Topics Concern  . None   Social History Narrative  . None    Review of Systems: Complete ROS negative except as per HPI.   Physical Exam: BP 137/74   Pulse 87   Temp 98.2 F (36.8  C) (Oral)   Ht 6' (1.829 m)   Wt 193 lb 9.6 oz (87.8 kg)   BMI 26.26 kg/m  General:   Alert and oriented. Pleasant and cooperative. Well-nourished and well-developed.  Eyes:  Without icterus, sclera clear and conjunctiva pink.  Ears:  Normal auditory acuity. Cardiovascular:  S1, S2 present without murmurs appreciated. Extremities without clubbing or edema. Respiratory:  Clear to auscultation bilaterally. No wheezes, rales, or rhonchi. No distress.  Gastrointestinal:  +BS, soft, non-tender and non-distended. No HSM noted. No guarding or rebound. No masses appreciated.  Rectal:  Deferred  Musculoskalatal:  Symmetrical without gross deformities. Skin:  Intact without significant lesions or rashes. Neurologic:  Alert and oriented x4;  grossly normal neurologically. Psych:  Alert and cooperative. Normal mood and affect. Heme/Lymph/Immune: No excessive bruising noted.    01/21/2017 11:16 AM   Disclaimer: This note was dictated with voice recognition software. Similar sounding words can inadvertently be transcribed and may not be corrected upon review.

## 2017-01-25 ENCOUNTER — Ambulatory Visit: Payer: Medicare Other | Admitting: Cardiology

## 2017-01-27 DIAGNOSIS — G7080 Lambert-Eaton syndrome, unspecified: Secondary | ICD-10-CM | POA: Diagnosis not present

## 2017-01-27 DIAGNOSIS — R569 Unspecified convulsions: Secondary | ICD-10-CM | POA: Diagnosis not present

## 2017-02-26 ENCOUNTER — Other Ambulatory Visit: Payer: Self-pay | Admitting: Cardiology

## 2017-03-18 ENCOUNTER — Ambulatory Visit: Payer: Medicare Other | Admitting: Neurology

## 2017-03-26 NOTE — Progress Notes (Signed)
REVIEWED. WEIGHT UP 24 LBS SINCE MAY 2017.

## 2017-03-29 DIAGNOSIS — G894 Chronic pain syndrome: Secondary | ICD-10-CM | POA: Diagnosis not present

## 2017-03-29 DIAGNOSIS — M47812 Spondylosis without myelopathy or radiculopathy, cervical region: Secondary | ICD-10-CM | POA: Diagnosis not present

## 2017-03-29 DIAGNOSIS — M5137 Other intervertebral disc degeneration, lumbosacral region: Secondary | ICD-10-CM | POA: Diagnosis not present

## 2017-03-29 DIAGNOSIS — M47817 Spondylosis without myelopathy or radiculopathy, lumbosacral region: Secondary | ICD-10-CM | POA: Diagnosis not present

## 2017-04-02 ENCOUNTER — Ambulatory Visit (INDEPENDENT_AMBULATORY_CARE_PROVIDER_SITE_OTHER): Payer: Medicare Other | Admitting: Cardiology

## 2017-04-02 ENCOUNTER — Encounter: Payer: Self-pay | Admitting: Cardiology

## 2017-04-02 VITALS — BP 180/90 | HR 70 | Ht 72.0 in | Wt 202.0 lb

## 2017-04-02 DIAGNOSIS — I951 Orthostatic hypotension: Secondary | ICD-10-CM | POA: Diagnosis not present

## 2017-04-02 DIAGNOSIS — R030 Elevated blood-pressure reading, without diagnosis of hypertension: Secondary | ICD-10-CM

## 2017-04-02 MED ORDER — FLUDROCORTISONE ACETATE 0.1 MG PO TABS: 0.1000 mg | ORAL_TABLET | Freq: Every day | ORAL | 1 refills | Status: DC

## 2017-04-02 NOTE — Progress Notes (Signed)
Clinical Summary Erik [name] is a 71 y.o.male seen today for follow up of the following medical problems.   1. Orthostatic hypotension - denies any recent dizziness - he previously had significnat weight gain and edema. We lowered his florinef dose, since that time his edema has improved. He continues to have weight gain he attributes to poor diet and lack of exercise. No SOB/DOE/orthopnea/PND  Past Medical History:  Diagnosis Date  . B12 deficiency 06/25/2015  . Chronic back pain   . COPD (chronic obstructive pulmonary disease) ([place])   . DDD (degenerative disc disease), lumbar   . GERD (gastroesophageal reflux disease)   . Iron deficiency anemia 06/04/2016  . 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 Low blood pressure   . Microcytic anemia 06/25/2015  . Seizures ([place])    in remote past   . Shortness of breath dyspnea      No Known Allergies   Current Outpatient Prescriptions  Medication Sig Dispense Refill  . acetaminophen (TYLENOL) 500 MG tablet Take 500 mg by mouth every 6 (six) hours as needed.    Marland Kitchen aspirin 325 MG tablet Take 325 mg by mouth daily.    . DULoxetine (CYMBALTA) 20 MG capsule 40 mg daily.     . fludrocortisone (FLORINEF) 0.1 MG tablet Take 1.5 tablets (0.15 mg total) by mouth daily.    . fludrocortisone (FLORINEF) 0.1 MG tablet TAKE TWO TABLETS BY MOUTH ONCE DAILY 60 tablet 6  . midodrine (PROAMATINE) 10 MG tablet TAKE ONE TABLET BY MOUTH THREE TIMES DAILY 270 tablet 3  . trolamine salicylate (ASPERCREME) 10 % cream Apply 1 application topically as needed for muscle pain.    . vitamin B-12 (CYANOCOBALAMIN) 1000 MCG tablet Take 1,000 mcg by mouth daily.    . Zinc 50 MG CAPS Take 50 mg by mouth every morning.     No current facility-administered medications for this visit.      Past Surgical History:  Procedure Laterality Date  . COLONOSCOPY N/A 11/12/2014   Dr. Clayburn Pert external and internal hemorrhoids, redundant left  colon  . ESOPHAGEAL DILATION N/A 11/12/2014   Procedure: ESOPHAGEAL DILATION;  Surgeon: Danie Binder, MD;  [facility];  Service: Endoscopy;  Laterality: N/A;  . ESOPHAGOGASTRODUODENOSCOPY N/A 11/12/2014   Dr. Fields:mild non-erosive gastritis. negative H.pylori   . None       No Known Allergies    Family History  Problem Relation Age of Onset  . Stomach cancer Maternal Grandfather   . Sickle cell anemia Daughter   . Colon cancer Neg Hx      Social History Erik [name] reports that he quit smoking about 33 years ago. His smoking use included Cigarettes. He started smoking about 54 years ago. He has a 10.50 pack-year smoking history. He has never used smokeless tobacco. Erik [name] reports that he does not drink alcohol.   Review of Systems CONSTITUTIONAL: No weight loss, fever, chills, weakness or fatigue.  HEENT: Eyes: No visual loss, blurred vision, double vision or yellow sclerae.No hearing loss, sneezing, congestion, runny nose or sore throat.  SKIN: No rash or itching.  CARDIOVASCULAR: no chest pain, no palpitations RESPIRATORY: No shortness of breath, cough or sputum.  GASTROINTESTINAL: No anorexia, nausea, vomiting or diarrhea. No abdominal pain or blood.  GENITOURINARY: No burning on urination, no polyuria NEUROLOGICAL: No headache, dizziness, syncope, paralysis, ataxia, numbness or tingling in the extremities. No change in bowel or bladder control.  MUSCULOSKELETAL: No muscle, back pain,  joint pain or stiffness.  LYMPHATICS: No enlarged nodes. No history of splenectomy.  PSYCHIATRIC: No history of depression or anxiety.  ENDOCRINOLOGIC: No reports of sweating, cold or heat intolerance. No polyuria or polydipsia.  Marland Kitchen   Physical Examination Vitals:   04/02/17 1053  BP: (!) 180/90  Pulse: 70  SpO2: 98%   Vitals:   04/02/17 1053  Weight: 202 lb (91.6 kg)  Height: 6' (1.829 m)    Gen: resting comfortably, no acute distress HEENT: no scleral  icterus, pupils equal round and reactive, no palptable cervical adenopathy,  CV: RRR, no m/rlg, no jvd Resp: Clear to auscultation bilaterally GI: abdomen is soft, non-tender, non-distended, normal bowel sounds, no hepatosplenomegaly MSK: extremities are warm, no edema.  Skin: warm, no rash Neuro:  no focal deficits Psych: appropriate affect   Diagnostic Studies 06/2014 echo Study Conclusions  - Left ventricle: The cavity size was normal. Wall thickness was normal. Systolic function was normal. The estimated ejection fraction was in the range of 55% to 60%. Wall motion was normal; there were no regional wall motion abnormalities. Doppler parameters are consistent with abnormal left ventricular relaxation (grade 1 diastolic dysfunction). - Aortic valve: Trileaflet; mildly thickened leaflets. - Mitral valve: Mildly thickened leaflets . There was trivial regurgitation. - Right atrium: Central venous pressure (est): 3 mm Hg. - Atrial septum: No defect or patent foramen ovale was identified. - Tricuspid valve: There was mild regurgitation. - Pulmonary arteries: PA peak pressure: 34 mm Hg (S). - Pericardium, extracardiac: There was no pericardial effusion.  Impressions:  - Normal LV wall thickness with LVEF 07-37%, grade 1 diastolic dysfunction. Mildly thickened aortic valve. Trivial mitral and mild regurgitation. PASP 34 mmHg.   Jan 2018 [facility] [place] No aneurysm    Assessment and Plan  1. Orthostatiic hypotension - no recent dizziness or syncope - his edema has improved on lower dose of florinef. Currently hypertensive, we will lower florinef again to 0.1mg  daily. Despite his continued weight gain on exam he does not look to have siginficant fluid overload, suspect additional weight gain due to poor diet and lack of exercise  2. Elevated blood pressure - lower dose of florinef as described above. Fragile balance between bp control and his orthostatic hypotension.     F/u 6 months   Arnoldo Lenis, M.D.

## 2017-04-02 NOTE — Patient Instructions (Signed)
Your physician wants you to follow-up in: [place] will receive a reminder letter in the mail two months in advance. If you don't receive a letter, please call our office to schedule the follow-up appointment.  Your physician has recommended you make the following change in your medication:   CHANGE FLORINEF 0.1 MG DAILY  Thank you for choosing [facility]!!

## 2017-04-15 ENCOUNTER — Encounter: Payer: Self-pay | Admitting: Neurology

## 2017-04-15 ENCOUNTER — Ambulatory Visit (INDEPENDENT_AMBULATORY_CARE_PROVIDER_SITE_OTHER): Payer: Medicare Other | Admitting: Neurology

## 2017-04-15 VITALS — BP 150/72 | HR 77 | Ht 72.0 in | Wt 199.2 lb

## 2017-04-15 DIAGNOSIS — I10 Essential (primary) hypertension: Secondary | ICD-10-CM | POA: Diagnosis not present

## 2017-04-15 DIAGNOSIS — G7080 Lambert-Eaton syndrome, unspecified: Secondary | ICD-10-CM

## 2017-04-15 DIAGNOSIS — M48061 Spinal stenosis, lumbar region without neurogenic claudication: Secondary | ICD-10-CM | POA: Diagnosis not present

## 2017-04-15 NOTE — Progress Notes (Signed)
NEUROLOGY FOLLOW UP OFFICE NOTE  Erik [name] [id number]  HISTORY OF PRESENT ILLNESS: Erik [name] is a 71 year old right-handed male with orthostatic hypotension who follows up for Lambert-Eaton Myasthenic Syndrome and low back pain.  He is accompanied by his daughter who supplements history.   UPDATE:  Erik [name] was referred to [facility] for evaluation and ongoing management of LEMS.  He underwent workup to determine if he is a candidate for 3,4-DAP.  He underwent a routine EEG as he has a remote history of alcohol-related seizure, which was normal.  He was started on 3/4-DAP in early July and has noted improvement in strength and ambulation. Back pain is well managed.  He sees Dr. Greta [name] of pain management. He is followed by hematology/oncology.  No primary source of malignancy has been found thus far.   HISTORY: He developed low back pain about a year ago.  There was no precipitating event.  He reports mid low back pain with diffuse pain radiating down both legs to the feet.  He describes it as a deep burning pain.  It occurs in all positions, either laying down, sitting or ambulating.  He notes some weakness in the legs as well.  He denies numbness.  For the past year, he uses a cane.  He denies bowel or bladder dysfunction but reports impotence over the past year.  He has orthostatic hypotension and takes Florinef and midodrine.  He notes mild neck pain.   MRI of the lumbar spine from 07/17/14 showed spondylosis and degenerative disc disease with disc bulge causing mild bilateral foraminal stenosis at L3-4 and disc bulge at L4-5 causing mild right bilateral foraminal stenosis and right greater than left facet arthropathy.   He saw a neurologist in [place].  NCV-EMG demonstrated demyelinating peripheral neuropathy.  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  CSF protein was 23.  Glucose was 60.  Cell count was 0.  Labs in May revealed CBC with chronic  mild microcytic anemia, Sed Rate 18, Kappa free light chain 23.29, normal IgG, IgA and IgM, normal SPEP and IFE, B12 205.   He does report that he feels weaker in the mornings, and improves later in the day.  He is a former smoker.  As per EPIC chart, he has had a 14 lb weight loss over the past year, although he and his daughter report a 40 lb weight loss.  He has poor appetite, however.  A NCV-EMG was performed on 06/11/15.  Findings did not show peripheral neuropathy, but it did reveal post-exercise facilitation of motor responses, suggesting a pre-synaptic neuromuscular junction disorder.   Voltage-gated calcium channel antibodies were positive. CT of chest showed a 9 mm sub sold nodule within the right upper lobe.  CT of abdomen and pelvis was unremarkable. He is followed by oncology.  A paraneoplastic source has not been found at this time.  He is treated for iron deficiency anemia.  PAST MEDICAL HISTORY: Past Medical History:  Diagnosis Date  . B12 deficiency 06/25/2015  . Chronic back pain   . COPD (chronic obstructive pulmonary disease) ([place])   . DDD (degenerative disc disease), lumbar   . GERD (gastroesophageal reflux disease)   . Iron deficiency anemia 06/04/2016  . 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 Low blood pressure   . Microcytic anemia 06/25/2015  . Seizures ([place])    in remote past   . Shortness of breath dyspnea  MEDICATIONS: Current Outpatient Prescriptions on File Prior to Visit  Medication Sig Dispense Refill  . acetaminophen (TYLENOL) 500 MG tablet Take 500 mg by mouth every 6 (six) hours as needed.    Marland Kitchen aspirin 325 MG tablet Take 325 mg by mouth daily.    . DULoxetine (CYMBALTA) 20 MG capsule 40 mg daily.     . fludrocortisone (FLORINEF) 0.1 MG tablet Take 1 tablet (0.1 mg total) by mouth daily. 90 tablet 1  . midodrine (PROAMATINE) 10 MG tablet TAKE ONE TABLET BY MOUTH THREE TIMES DAILY 270 tablet 3  . MISC NATURAL PRODUCTS PO Take  by mouth 3 (three) times daily. DAP - experimental drug from [facility] 02/12/17 for Lambert-Eaton Myasthenic Syndrome    . trolamine salicylate (ASPERCREME) 10 % cream Apply 1 application topically as needed for muscle pain.    . vitamin B-12 (CYANOCOBALAMIN) 1000 MCG tablet Take 1,000 mcg by mouth daily.    . Zinc 50 MG CAPS Take 50 mg by mouth every morning.     No current facility-administered medications on file prior to visit.     ALLERGIES: No Known Allergies  FAMILY HISTORY: Family History  Problem Relation Age of Onset  . Stomach cancer Maternal Grandfather   . Sickle cell anemia Daughter   . Colon cancer Neg Hx     SOCIAL HISTORY: Social History   Social History  . Marital status: Legally Separated    Spouse name: N/A  . Number of children: N/A  . Years of education: N/A   Occupational History  . Not on file.   Social History Main Topics  . Smoking status: Former Smoker    Packs/day: 0.50    Years: 21.00    Types: Cigarettes    Start date: 01/22/1963    Quit date: 08/11/1983  . Smokeless tobacco: Never Used  . Alcohol use No     Comment: remote past, history of ETOH abuse.   . Drug use: No  . Sexual activity: Not on file   Other Topics Concern  . Not on file   Social History Narrative  . No narrative on file    REVIEW OF SYSTEMS: Constitutional: No fevers, chills, or sweats, no generalized fatigue, change in appetite Eyes: No visual changes, double vision, eye pain Ear, nose and throat: No hearing loss, ear pain, nasal congestion, sore throat Cardiovascular: No chest pain, palpitations Respiratory:  No shortness of breath at rest or with exertion, wheezes GastrointestinaI: No nausea, vomiting, diarrhea, abdominal pain, fecal incontinence Genitourinary:  No dysuria, urinary retention or frequency Musculoskeletal:  No neck pain, back pain Integumentary: No rash, pruritus, skin lesions Neurological: as above Psychiatric: No depression, insomnia,  anxiety Endocrine: No palpitations, fatigue, diaphoresis, mood swings, change in appetite, change in weight, increased thirst Hematologic/Lymphatic:  No purpura, petechiae. Allergic/Immunologic: no itchy/runny eyes, nasal congestion, recent allergic reactions, rashes  PHYSICAL EXAM: Vitals:   04/15/17 1406  BP: (!) 150/72  Pulse: 77  SpO2: 94%   General: No acute distress.  Patient appears well-groomed.   Head:  Normocephalic/atraumatic Eyes:  Fundi examined but not visualized Neck: supple, no paraspinal tenderness, full range of motion Heart:  Regular rate and rhythm Lungs:  Clear to auscultation bilaterally Back: No paraspinal tenderness Neurological Exam: alert and oriented to person, place, and time. Attention span and concentration intact, recent and remote memory intact, fund of knowledge intact.  Speech fluent and not dysarthric, language intact.  CN II-XII intact. Bulk and tone normal, muscle strength 5/5 throughout.  Sensation to light touch intact.  Deep tendon reflexes 2+ throughout, toes downgoing.  Finger to nose and heel to shin testing intact.  Gait normal, Romberg negative.  IMPRESSION: Lambert-Eaton Myasthenic Syndrome Lumbar stenosis  PLAN: Follow up care with Dr. Queen Blossom at [facility] for LEMS and with Dr. Greta [name] for chronic back pain He may follow up with me as needed. Follow up blood pressure with PCP  16 minutes spent face to face with patient, over 50% spent discussing management.  Metta Clines, DO  CC:  [name], MD

## 2017-04-15 NOTE — Patient Instructions (Signed)
1.  Follow up with [facility] and Dr. Greta Doom 2.  Follow up with me as needed.

## 2017-05-06 DIAGNOSIS — R911 Solitary pulmonary nodule: Secondary | ICD-10-CM | POA: Diagnosis not present

## 2017-05-06 DIAGNOSIS — R948 Abnormal results of function studies of other organs and systems: Secondary | ICD-10-CM | POA: Diagnosis not present

## 2017-05-06 DIAGNOSIS — Z5181 Encounter for therapeutic drug level monitoring: Secondary | ICD-10-CM | POA: Diagnosis not present

## 2017-05-06 DIAGNOSIS — G7080 Lambert-Eaton syndrome, unspecified: Secondary | ICD-10-CM | POA: Diagnosis not present

## 2017-05-18 DIAGNOSIS — R9389 Abnormal findings on diagnostic imaging of other specified body structures: Secondary | ICD-10-CM | POA: Diagnosis not present

## 2017-05-18 DIAGNOSIS — R948 Abnormal results of function studies of other organs and systems: Secondary | ICD-10-CM | POA: Diagnosis not present

## 2017-05-18 DIAGNOSIS — R911 Solitary pulmonary nodule: Secondary | ICD-10-CM | POA: Diagnosis not present

## 2017-05-18 DIAGNOSIS — G7080 Lambert-Eaton syndrome, unspecified: Secondary | ICD-10-CM | POA: Diagnosis not present

## 2017-06-04 ENCOUNTER — Other Ambulatory Visit (HOSPITAL_COMMUNITY): Payer: Medicare Other

## 2017-06-04 ENCOUNTER — Encounter (HOSPITAL_COMMUNITY): Payer: Self-pay | Admitting: Oncology

## 2017-06-04 ENCOUNTER — Encounter (HOSPITAL_COMMUNITY): Payer: Medicare Other | Attending: Oncology

## 2017-06-04 ENCOUNTER — Ambulatory Visit (HOSPITAL_COMMUNITY): Payer: Medicare Other

## 2017-06-04 ENCOUNTER — Encounter (HOSPITAL_BASED_OUTPATIENT_CLINIC_OR_DEPARTMENT_OTHER): Payer: Medicare Other | Admitting: Oncology

## 2017-06-04 VITALS — BP 103/83 | HR 86 | Temp 98.2°F | Resp 18 | Wt 200.8 lb

## 2017-06-04 DIAGNOSIS — Z7982 Long term (current) use of aspirin: Secondary | ICD-10-CM | POA: Diagnosis not present

## 2017-06-04 DIAGNOSIS — G709 Myoneural disorder, unspecified: Secondary | ICD-10-CM | POA: Diagnosis not present

## 2017-06-04 DIAGNOSIS — K219 Gastro-esophageal reflux disease without esophagitis: Secondary | ICD-10-CM | POA: Insufficient documentation

## 2017-06-04 DIAGNOSIS — D500 Iron deficiency anemia secondary to blood loss (chronic): Secondary | ICD-10-CM

## 2017-06-04 DIAGNOSIS — R718 Other abnormality of red blood cells: Secondary | ICD-10-CM

## 2017-06-04 DIAGNOSIS — G8929 Other chronic pain: Secondary | ICD-10-CM | POA: Insufficient documentation

## 2017-06-04 DIAGNOSIS — D509 Iron deficiency anemia, unspecified: Secondary | ICD-10-CM | POA: Diagnosis not present

## 2017-06-04 DIAGNOSIS — Z79899 Other long term (current) drug therapy: Secondary | ICD-10-CM | POA: Insufficient documentation

## 2017-06-04 DIAGNOSIS — M549 Dorsalgia, unspecified: Secondary | ICD-10-CM | POA: Diagnosis not present

## 2017-06-04 DIAGNOSIS — J449 Chronic obstructive pulmonary disease, unspecified: Secondary | ICD-10-CM | POA: Insufficient documentation

## 2017-06-04 DIAGNOSIS — R531 Weakness: Secondary | ICD-10-CM | POA: Insufficient documentation

## 2017-06-04 DIAGNOSIS — E538 Deficiency of other specified B group vitamins: Secondary | ICD-10-CM

## 2017-06-04 DIAGNOSIS — Z87891 Personal history of nicotine dependence: Secondary | ICD-10-CM | POA: Insufficient documentation

## 2017-06-04 DIAGNOSIS — D508 Other iron deficiency anemias: Secondary | ICD-10-CM | POA: Diagnosis not present

## 2017-06-04 LAB — IRON AND TIBC
IRON: 43 ug/dL — AB (ref 45–182)
Saturation Ratios: 14 % — ABNORMAL LOW (ref 17.9–39.5)
TIBC: 318 ug/dL (ref 250–450)
UIBC: 275 ug/dL

## 2017-06-04 LAB — CBC WITH DIFFERENTIAL/PLATELET
BASOS ABS: 0 10*3/uL (ref 0.0–0.1)
Basophils Relative: 0 %
Eosinophils Absolute: 0.8 10*3/uL — ABNORMAL HIGH (ref 0.0–0.7)
Eosinophils Relative: 8 %
HEMATOCRIT: 35.7 % — AB (ref 39.0–52.0)
Hemoglobin: 11.4 g/dL — ABNORMAL LOW (ref 13.0–17.0)
LYMPHS ABS: 1.7 10*3/uL (ref 0.7–4.0)
LYMPHS PCT: 18 %
MCH: 22.8 pg — AB (ref 26.0–34.0)
MCHC: 31.9 g/dL (ref 30.0–36.0)
MCV: 71.5 fL — AB (ref 78.0–100.0)
MONO ABS: 0.8 10*3/uL (ref 0.1–1.0)
MONOS PCT: 8 %
NEUTROS ABS: 6.2 10*3/uL (ref 1.7–7.7)
Neutrophils Relative %: 66 %
Platelets: 292 10*3/uL (ref 150–400)
RBC: 4.99 MIL/uL (ref 4.22–5.81)
RDW: 15.3 % (ref 11.5–15.5)
WBC: 9.5 10*3/uL (ref 4.0–10.5)

## 2017-06-04 LAB — COMPREHENSIVE METABOLIC PANEL
ALT: 13 U/L — ABNORMAL LOW (ref 17–63)
AST: 26 U/L (ref 15–41)
Albumin: 3.6 g/dL (ref 3.5–5.0)
Alkaline Phosphatase: 82 U/L (ref 38–126)
Anion gap: 8 (ref 5–15)
BILIRUBIN TOTAL: 0.4 mg/dL (ref 0.3–1.2)
BUN: 9 mg/dL (ref 6–20)
CO2: 30 mmol/L (ref 22–32)
CREATININE: 1.2 mg/dL (ref 0.61–1.24)
Calcium: 8.9 mg/dL (ref 8.9–10.3)
Chloride: 99 mmol/L — ABNORMAL LOW (ref 101–111)
GFR, EST NON AFRICAN AMERICAN: 59 mL/min — AB (ref >60)
Glucose, Bld: 161 mg/dL — ABNORMAL HIGH (ref 65–99)
POTASSIUM: 3.7 mmol/L (ref 3.5–5.1)
Sodium: 137 mmol/L (ref 135–145)
TOTAL PROTEIN: 7.3 g/dL (ref 6.5–8.1)

## 2017-06-04 LAB — VITAMIN B12: Vitamin B-12: 991 pg/mL — ABNORMAL HIGH (ref 180–914)

## 2017-06-04 LAB — FERRITIN: FERRITIN: 71 ng/mL (ref 24–336)

## 2017-06-04 NOTE — Progress Notes (Signed)
[facility] at [place] Note  Patient Care Team: [name], MD as PCP - General (Nurse Practitioner) Danie Binder, MD as Consulting Physician (Gastroenterology)  CHIEF COMPLAINTS/PURPOSE OF CONSULTATION:  Microcytic Anemia, labs on 07/30/2014 with hgb 12, hct 37.3 and mcv 70.4 Normal iron studies Positive sickle cell screen Normal serum immunofixation Colonoscopy 11/12/2014 EGD 11/12/2014 Esophageal dilation 11/12/2014 Leukocytosis B12 deficiency Rita Ohara Myasthenia Gravis  HISTORY OF PRESENTING ILLNESS:  Erik [name] 71 y.o. male is here for follow-up of microcytic anemia.   He is doing well today. He has some weakness in his legs currently, but otherwise his myasthenia has been stable. He lives alone, but his daughter lives next door to him. Denies chest pain, SOB, abdominal pain, weight loss, loss of appetite, recent infections, or any other concerns.  June 04, 2017 Patient is here for ongoing evaluation and treatment consideration.  Patient is getting some medication for chronic back pain and is helping him.  Patient does not know the name of medication being given by physician in [facility] Does not smoke does not drink feels well MEDICAL HISTORY:  Past Medical History:  Diagnosis Date  . B12 deficiency 06/25/2015  . Chronic back pain   . COPD (chronic obstructive pulmonary disease) ([place])   . DDD (degenerative disc disease), lumbar   . GERD (gastroesophageal reflux disease)   . Iron deficiency anemia 06/04/2016  . 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 Low blood pressure   . Microcytic anemia 06/25/2015  . Seizures ([place])    in remote past   . Shortness of breath dyspnea     SURGICAL HISTORY: Past Surgical History:  Procedure Laterality Date  . COLONOSCOPY N/A 11/12/2014   Dr. Clayburn Pert external and internal hemorrhoids, redundant left colon  . ESOPHAGEAL DILATION N/A 11/12/2014   Procedure: ESOPHAGEAL DILATION;  Surgeon: Danie Binder, MD;  Location: [facility];  Service: Endoscopy;  Laterality: N/A;  . ESOPHAGOGASTRODUODENOSCOPY N/A 11/12/2014   Dr. Fields:mild non-erosive gastritis. negative H.pylori   . None      SOCIAL HISTORY: Social History   Social History  . Marital status: Legally Separated    Spouse name: N/A  . Number of children: N/A  . Years of education: N/A   Occupational History  . Not on file.   Social History Main Topics  . Smoking status: Former Smoker    Packs/day: 0.50    Years: 21.00    Types: Cigarettes    Start date: 01/22/1963    Quit date: 08/11/1983  . Smokeless tobacco: Never Used  . Alcohol use No     Comment: remote past, history of ETOH abuse.   . Drug use: No  . Sexual activity: Not on file   Other Topics Concern  . Not on file   Social History Narrative  . No narrative on file   Separated. 2 children. 1 boy and 1 girl. Quit smoking in the 80's. Quit drinking in the 80's. 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   FAMILY HISTORY: Family History  Problem Relation Age of Onset  . Stomach cancer Maternal Grandfather   . Sickle cell anemia Daughter   . Colon cancer Neg Hx    indicated that the status of his maternal grandfather is unknown. He indicated that the status of his daughter is unknown. He indicated that the status of his neg hx is unknown.    Daughter has sickle cell anemia.  Son has sickle cell trait. Mother deceased 9-60 yo, Alzeheimers. Father deceased close to 48 yo. Unsure what he died from. 2 step brothers. 3 sisters. No one has cancer or blood problems that he knows of.  ALLERGIES:  has No Known Allergies.  MEDICATIONS:  Current Outpatient Prescriptions  Medication Sig Dispense Refill  . acetaminophen (TYLENOL) 500 MG tablet Take 500 mg by mouth every 6 (six) hours as needed.    Marland Kitchen aspirin 325 MG tablet Take 325 mg by mouth daily.    . DULoxetine (CYMBALTA) 20 MG  capsule 40 mg daily.     . fludrocortisone (FLORINEF) 0.1 MG tablet Take 1 tablet (0.1 mg total) by mouth daily. 90 tablet 1  . midodrine (PROAMATINE) 10 MG tablet TAKE ONE TABLET BY MOUTH THREE TIMES DAILY 270 tablet 3  . MISC NATURAL PRODUCTS PO Take by mouth 3 (three) times daily. DAP - experimental drug from [facility] 02/12/17 for Lambert-Eaton Myasthenic Syndrome    . trolamine salicylate (ASPERCREME) 10 % cream Apply 1 application topically as needed for muscle pain.    . vitamin B-12 (CYANOCOBALAMIN) 1000 MCG tablet Take 1,000 mcg by mouth daily.    . Zinc 50 MG CAPS Take 50 mg by mouth every morning.     No current facility-administered medications for this visit.    Review of Systems  Constitutional: Negative for weight loss.       No loss of appetite No recent infections  HENT: Negative.   Eyes: Negative.   Respiratory: Negative.  Negative for shortness of breath.   Cardiovascular: Negative.  Negative for chest pain.  Gastrointestinal: Negative.  Negative for abdominal pain.  Genitourinary: Negative.   Musculoskeletal: Negative.   Skin: Negative.   Neurological: Positive for weakness (legs).  Endo/Heme/Allergies: Negative.   Psychiatric/Behavioral: Negative.   All other systems reviewed and are negative. 14 point ROS was done and is otherwise as detailed above or in HPI  PHYSICAL EXAMINATION: ECOG PERFORMANCE STATUS: 0 - Asymptomatic  There were no vitals filed for this visit. There were no vitals filed for this visit. Physical Exam  Constitutional: He is oriented to person, place, and time and well-developed, well-nourished, and in no distress.  HENT:  Head: Normocephalic and atraumatic.  Mouth/Throat: Oropharynx is clear and moist.  Eyes: Pupils are equal, round, and reactive to light. Conjunctivae and EOM are normal.  Neck: Normal range of motion. Neck supple.  Cardiovascular: Normal rate, regular rhythm and normal heart sounds.   Pulmonary/Chest: Effort normal  and breath sounds normal.  Abdominal: Soft. Bowel sounds are normal.  Musculoskeletal: Normal range of motion.  Neurological: He is alert and oriented to person, place, and time. Gait normal.  Skin: Skin is warm and dry.  Nursing note and vitals reviewed.  LABORATORY DATA:  I have reviewed the data as listed Lab Results  Component Value Date   WBC 9.5 06/04/2017   HGB 11.4 (L) 06/04/2017   HCT 35.7 (L) 06/04/2017   MCV 71.5 (L) 06/04/2017   PLT 292 06/04/2017   CMP Latest Ref Rng & Units 06/04/2017 12/03/2016 06/04/2016  Glucose 65 - 99 mg/dL 161(H) 104(H) 110(H)  BUN 6 - 20 mg/dL 9 11 15   Creatinine 0.61 - 1.24 mg/dL 1.20 1.25(H) 1.22  Sodium 135 - 145 mmol/L 137 137 138  Potassium 3.5 - 5.1 mmol/L 3.7 3.5 3.7  Chloride 101 - 111 mmol/L 99(L) 98(L) 101  CO2 22 - 32 mmol/L 30 31 31   Calcium 8.9 - 10.3 mg/dL  8.9 9.4 9.4  Total Protein 6.5 - 8.1 g/dL 7.3 7.6 -  Total Bilirubin 0.3 - 1.2 mg/dL 0.4 0.6 -  Alkaline Phos 38 - 126 U/L 82 59 -  AST 15 - 41 U/L 26 32 -  ALT 17 - 63 U/L 13(L) 25 -  Results for TARON, MONDOR (MRN [id number]) as of 12/03/2016 15:14  Ref. Range 12/03/2016 10:07 12/03/2016 10:08  Iron Latest Ref Range: 45 - 182 ug/dL 60   UIBC Latest Units: ug/dL 304   TIBC Latest Ref Range: 250 - 450 ug/dL 364   Saturation Ratios Latest Ref Range: 17.9 - 39.5 % 16 (L)   Ferritin Latest Ref Range: 24 - 336 ng/mL 85   Folate Latest Ref Range: >5.9 ng/mL  36.3  Vitamin B12 Latest Ref Range: 180 - 914 pg/mL 1,477 (H)    ASSESSMENT & PLAN:  Microcytic Anemia h/o b12 deficiency  71 year old male with microcytic anemia, positive sickle cell screen, (daughter with sickle cell anemia and son with sickle cell trait) normal iron studies and GI evaluation.  PLAN: I have reviewed labs with the patient today. He is doing well. Hemoglobin 13.1 g/dL. He has normal iron, b12, folate levels.  Continue daily B12 supplements.   He will return for follow up in 6 months with labs.    No orders of the defined types were placed in this encounter.   All questions were answered. The patient knows to call the clinic with any problems, questions or concerns.  This document serves as a record of services personally performed by Twana First, MD. It was created on her behalf by Martinique Casey, a trained medical scribe. The creation of this record is based on the scribe's personal observations and the provider's statements to them. This document has been checked and approved by the attending provider.   All lab data has been reviewed hemoglobin appears to be stable  I have reviewed the above documentation for accuracy and completeness and I agree with the above.  This note was electronically signed.   Forest Gleason, MD

## 2017-06-04 NOTE — Patient Instructions (Signed)
[facility] at [facility] Discharge Instructions  RECOMMENDATIONS MADE BY THE CONSULTANT AND ANY TEST RESULTS WILL BE SENT TO YOUR REFERRING PHYSICIAN.  You saw Dr. Oliva Bustard today. See Andee Poles at checkout for appointments.   Thank you for choosing [place] at [facility] to provide your oncology and hematology care.  To afford each patient quality time with our provider, please arrive at least 15 minutes before your scheduled appointment time.    If you have a lab appointment with the [facility] please come in thru the  Main Entrance and check in at the main information desk  You need to re-schedule your appointment should you arrive 10 or more minutes late.  We strive to give you quality time with our providers, and arriving late affects you and other patients whose appointments are after yours.  Also, if you no show three or more times for appointments you may be dismissed from the clinic at the providers discretion.     Again, thank you for choosing [facility].  Our hope is that these requests will decrease the amount of time that you wait before being seen by our physicians.       _____________________________________________________________  Should you have questions after your visit to [facility], please contact our office at [phone] between the hours of 8:30 a.m. and 4:30 p.m.  Voicemails left after 4:30 p.m. will not be returned until the following business day.  For prescription refill requests, have your pharmacy contact our office.       Resources For Cancer Patients and their Caregivers ? American Cancer Society: Can assist with transportation, wigs, general needs, runs Look Good Feel Better.        (989)028-1703 ? Cancer Care: Provides financial assistance, online support groups, medication/co-pay assistance.  1-800-813-HOPE 262-798-7505) ? [place] Assists [place] cancer patients and their families through emotional , educational and financial support.  561-386-9159 ? [place] DSS Where to apply for food stamps, Medicaid and utility assistance. (360)610-9348 ? [facility]: Transportation to medical appointments. (586) 511-5729 ? Social Security Administration: May apply for disability if have a Stage IV cancer. 202-379-7103 609-682-7881 ? [employer], Disability and Transit Services: Assists with nutrition, care and transit needs. [place] Support Programs: @10RELATIVEDAYS @ > Cancer Support Group  2nd Tuesday of the month 1pm-2pm, Journey Room  > Creative Journey  3rd Tuesday of the month 1130am-1pm, Journey Room  > Look Good Feel Better  1st Wednesday of the month 10am-12 noon, Journey Room (Call [place] to register [phone])

## 2017-06-07 ENCOUNTER — Other Ambulatory Visit (HOSPITAL_COMMUNITY): Payer: Self-pay | Admitting: Oncology

## 2017-06-15 ENCOUNTER — Encounter (HOSPITAL_COMMUNITY): Payer: Self-pay

## 2017-06-15 ENCOUNTER — Encounter (HOSPITAL_COMMUNITY): Payer: Medicare Other | Attending: Oncology

## 2017-06-15 VITALS — BP 154/69 | HR 70 | Temp 98.1°F | Resp 18

## 2017-06-15 DIAGNOSIS — K219 Gastro-esophageal reflux disease without esophagitis: Secondary | ICD-10-CM | POA: Insufficient documentation

## 2017-06-15 DIAGNOSIS — D509 Iron deficiency anemia, unspecified: Secondary | ICD-10-CM | POA: Diagnosis present

## 2017-06-15 DIAGNOSIS — G709 Myoneural disorder, unspecified: Secondary | ICD-10-CM | POA: Insufficient documentation

## 2017-06-15 DIAGNOSIS — Z87891 Personal history of nicotine dependence: Secondary | ICD-10-CM | POA: Insufficient documentation

## 2017-06-15 DIAGNOSIS — E538 Deficiency of other specified B group vitamins: Secondary | ICD-10-CM | POA: Insufficient documentation

## 2017-06-15 DIAGNOSIS — R531 Weakness: Secondary | ICD-10-CM | POA: Insufficient documentation

## 2017-06-15 DIAGNOSIS — J449 Chronic obstructive pulmonary disease, unspecified: Secondary | ICD-10-CM | POA: Insufficient documentation

## 2017-06-15 DIAGNOSIS — D508 Other iron deficiency anemias: Secondary | ICD-10-CM | POA: Insufficient documentation

## 2017-06-15 DIAGNOSIS — G8929 Other chronic pain: Secondary | ICD-10-CM | POA: Insufficient documentation

## 2017-06-15 DIAGNOSIS — Z79899 Other long term (current) drug therapy: Secondary | ICD-10-CM | POA: Insufficient documentation

## 2017-06-15 DIAGNOSIS — M549 Dorsalgia, unspecified: Secondary | ICD-10-CM | POA: Insufficient documentation

## 2017-06-15 DIAGNOSIS — Z7982 Long term (current) use of aspirin: Secondary | ICD-10-CM | POA: Insufficient documentation

## 2017-06-15 MED ORDER — SODIUM CHLORIDE 0.9 % IV SOLN
510.0000 mg | Freq: Once | INTRAVENOUS | Status: AC
  Administered 2017-06-15: 510 mg via INTRAVENOUS
  Filled 2017-06-15: qty 17

## 2017-06-15 MED ORDER — SODIUM CHLORIDE 0.9 % IV SOLN
Freq: Once | INTRAVENOUS | Status: AC
  Administered 2017-06-15: 14:00:00 via INTRAVENOUS

## 2017-06-15 NOTE — Progress Notes (Signed)
Patient tolerated iron infusion with no complaints voiced.  Good blood return noted before and after administration of iron.  Peripheral site clean and dry with no bruising or swelling noted at site.  Band aid applied.  VSS with discharge and left ambulatory with daughter.

## 2017-06-15 NOTE — Patient Instructions (Signed)
[facility] at [facility]  Discharge Instructions:  You received an iron infusion today.  _______________________________________________________________  Thank you for choosing [facility] at [facility] to provide your oncology and hematology care.  To afford each patient quality time with our providers, please arrive at least 15 minutes before your scheduled appointment.  You need to re-schedule your appointment if you arrive 10 or more minutes late.  We strive to give you quality time with our providers, and arriving late affects you and other patients whose appointments are after yours.  Also, if you no show three or more times for appointments you may be dismissed from the clinic.  Again, thank you for choosing [facility] at [facility]. Our hope is that these requests will allow you access to exceptional care and in a timely manner. _______________________________________________________________  If you have questions after your visit, please contact our office at [phone] between the hours of 8:30 a.m. and 5:00 p.m. Voicemails left after 4:30 p.m. will not be returned until the following business day. _______________________________________________________________  For prescription refill requests, have your pharmacy contact our office. _______________________________________________________________  Recommendations made by the consultant and any test results will be sent to your referring physician. _______________________________________________________________ 

## 2017-06-16 ENCOUNTER — Telehealth: Payer: Self-pay | Admitting: Cardiology

## 2017-06-16 NOTE — Telephone Encounter (Signed)
Patient daughter called [name]) states that patient continues to retain fluids.  She states that his BP is elevated.  Please call [phone].  Leave message after 11Am until 400pm.

## 2017-06-16 NOTE — Telephone Encounter (Signed)
Have him stop his florinef for now. Update [place] again in 1 week   Zandra Abts MD

## 2017-06-16 NOTE — Telephone Encounter (Signed)
LM on Erik [name]'s cell as requested.

## 2017-06-16 NOTE — Telephone Encounter (Signed)
Ms. Erik [name] says pt swelling in feet/ankles for the last 2 weeks. Denies SOB/chest pain dizziness. BP running 156/80 says HR has been WNL. Routed to Dr Harl Bowie

## 2017-07-12 ENCOUNTER — Telehealth: Payer: Self-pay | Admitting: Cardiology

## 2017-07-12 NOTE — Telephone Encounter (Signed)
Can he see me Wed at 340, bring all his pills with him   Zandra Abts MD

## 2017-07-12 NOTE — Telephone Encounter (Signed)
Mr. Bagshaw daughter stating that he is having a lot of swelling again.  Please call [name] [phone].

## 2017-07-12 NOTE — Telephone Encounter (Signed)
Daughter calling - Stating fluid now moved up to his knees.   Was told to stop his Florinef last month.  Noticed that swelling has been bad x 1 week.  Daughter states that she lives next door to father.  Stated that she really does not know what he is taking - he will not let her fix his pills for him.  No c/o chest pain, dizziness, or SOB.

## 2017-07-12 NOTE — Telephone Encounter (Signed)
Left message to return call 

## 2017-07-13 NOTE — Telephone Encounter (Signed)
Patients daughter notified. Appointment for tomorrow confirmed

## 2017-07-13 NOTE — Telephone Encounter (Signed)
Left message to return call 

## 2017-07-14 ENCOUNTER — Other Ambulatory Visit: Payer: Self-pay

## 2017-07-14 ENCOUNTER — Ambulatory Visit (INDEPENDENT_AMBULATORY_CARE_PROVIDER_SITE_OTHER): Payer: Medicare Other | Admitting: Cardiology

## 2017-07-14 ENCOUNTER — Encounter: Payer: Self-pay | Admitting: Cardiology

## 2017-07-14 VITALS — BP 140/68 | HR 98 | Ht 72.0 in | Wt 213.0 lb

## 2017-07-14 DIAGNOSIS — R600 Localized edema: Secondary | ICD-10-CM | POA: Diagnosis not present

## 2017-07-14 DIAGNOSIS — I951 Orthostatic hypotension: Secondary | ICD-10-CM | POA: Diagnosis not present

## 2017-07-14 MED ORDER — FUROSEMIDE 20 MG PO TABS: 20.0000 mg | ORAL_TABLET | Freq: Every day | ORAL | 1 refills | Status: DC

## 2017-07-14 NOTE — Progress Notes (Signed)
Clinical Summary Erik [name] is a 71 y.o.male seen today for follow up of the following medical problems.   1. Orthostatic hypotension -florinef stopped due to recent issues with weight gain and edema. Swelling continues - denies any recent dizziness.   2. Erik [name] myasthenic syndrome - followed by neuro   Past Medical History:  Diagnosis Date  . B12 deficiency 06/25/2015  . Chronic back pain   . COPD (chronic obstructive pulmonary disease) ([place])   . DDD (degenerative disc disease), lumbar   . GERD (gastroesophageal reflux disease)   . Iron deficiency anemia 06/04/2016  . LEMS (Erik [name] myasthenic syndrome) ([place]) 06/25/2015  . Low blood pressure   . Microcytic anemia 06/25/2015  . Seizures ([place])    in remote past   . Shortness of breath dyspnea      No Known Allergies   Current Outpatient Medications  Medication Sig Dispense Refill  . acetaminophen (TYLENOL) 500 MG tablet Take 500 mg by mouth every 6 (six) hours as needed.    Marland Kitchen aspirin 325 MG tablet Take 325 mg by mouth daily.    . DULoxetine (CYMBALTA) 20 MG capsule 40 mg daily.     . fludrocortisone (FLORINEF) 0.1 MG tablet Take 1 tablet (0.1 mg total) by mouth daily. 90 tablet 1  . midodrine (PROAMATINE) 10 MG tablet TAKE ONE TABLET BY MOUTH THREE TIMES DAILY 270 tablet 3  . MISC NATURAL PRODUCTS PO Take by mouth 3 (three) times daily. DAP - experimental drug from [facility] 02/12/17 for Erik [name] Myasthenic Syndrome    . trolamine salicylate (ASPERCREME) 10 % cream Apply 1 application topically as needed for muscle pain.    . vitamin B-12 (CYANOCOBALAMIN) 1000 MCG tablet Take 1,000 mcg by mouth daily.    . Zinc 50 MG CAPS Take 50 mg by mouth every morning.     No current facility-administered medications for this visit.      Past Surgical History:  Procedure Laterality Date  . COLONOSCOPY N/A 11/12/2014   Erik [name] external and internal hemorrhoids, redundant left colon  . ESOPHAGEAL  DILATION N/A 11/12/2014   Procedure: ESOPHAGEAL DILATION;  Surgeon: Erik Binder, MD;  [facility];  Service: Endoscopy;  Laterality: N/A;  . ESOPHAGOGASTRODUODENOSCOPY N/A 11/12/2014   Erik [name]:mild non-erosive gastritis. negative H.pylori   . None       No Known Allergies    Family History  Problem Relation Age of Onset  . Stomach cancer Maternal Grandfather   . Sickle cell anemia Daughter   . Colon cancer Neg Hx      Social History Erik [name] reports that he quit smoking about 33 years ago. His smoking use included cigarettes. He started smoking about 54 years ago. He has a 10.50 pack-year smoking history. he has never used smokeless tobacco. Erik [name] reports that he does not drink alcohol.   Review of Systems CONSTITUTIONAL: No weight loss, fever, chills, weakness or fatigue.  HEENT: Eyes: No visual loss, blurred vision, double vision or yellow sclerae.No hearing loss, sneezing, congestion, runny nose or sore throat.  SKIN: No rash or itching.  CARDIOVASCULAR: no chest pain, no palpitatoins RESPIRATORY: +SOB GASTROINTESTINAL: No anorexia, nausea, vomiting or diarrhea. No abdominal pain or blood.  GENITOURINARY: No burning on urination, no polyuria NEUROLOGICAL: No headache, dizziness, syncope, paralysis, ataxia, numbness or tingling in the extremities. No change in bowel or bladder control.  MUSCULOSKELETAL: No muscle, back pain, joint pain or stiffness.  LYMPHATICS: No enlarged nodes. No  history of splenectomy.  PSYCHIATRIC: No history of depression or anxiety.  ENDOCRINOLOGIC: No reports of sweating, cold or heat intolerance. No polyuria or polydipsia.  Marland Kitchen   Physical Examination Vitals:   07/14/17 1530  BP: 140/68  Pulse: 98  SpO2: 97%   Vitals:   07/14/17 1530  Weight: 213 lb (96.6 kg)  Height: 6' (1.829 m)    Gen: resting comfortably, no acute distress HEENT: no scleral icterus, pupils equal round and reactive, no palptable cervical  adenopathy,  CV: RRR, no m/r/g, no jvd Resp: Clear to auscultation bilaterally GI: abdomen is soft, non-tender, non-distended, normal bowel sounds, no hepatosplenomegaly MSK: extremities are warm, 1+ bilateral LE edema Skin: warm, no rash Neuro:  no focal deficits Psych: appropriate affect   Diagnostic Studies 06/2014 echo Study Conclusions  - Left ventricle: The cavity size was normal. Wall thickness was normal. Systolic function was normal. The estimated ejection fraction was in the range of 55% to 60%. Wall motion was normal; there were no regional wall motion abnormalities. Doppler parameters are consistent with abnormal left ventricular relaxation (grade 1 diastolic dysfunction). - Aortic valve: Trileaflet; mildly thickened leaflets. - Mitral valve: Mildly thickened leaflets . There was trivial regurgitation. - Right atrium: Central venous pressure (est): 3 mm Hg. - Atrial septum: No defect or patent foramen ovale was identified. - Tricuspid valve: There was mild regurgitation. - Pulmonary arteries: PA peak pressure: 34 mm Hg (S). - Pericardium, extracardiac: There was no pericardial effusion.  Impressions:  - Normal LV wall thickness with LVEF 52-08%, grade 1 diastolic dysfunction. Mildly thickened aortic valve. Trivial mitral and mild regurgitation. PASP 34 mmHg.   Jan 2018 [facility] [place] No aneurysm    Assessment and Plan  1. Orthostatiic hypotension - no recent symptoms - florinef stopped due to significant edema and weight gain, which continues. We will start lasix 20mg . Check BMET/Mg in 2 weeks. Update [place] on his weights in 1 week. - check echo due to degree of edema to evaluate for cardiac dysfuntion   F/u 4 months      Arnoldo Lenis, M.D.

## 2017-07-14 NOTE — Patient Instructions (Signed)
Your physician recommends that you schedule a follow-up appointment in: [place] has recommended you make the following change in your medication:   START LASIX 20 MG DAILY  Your physician recommends that you return for lab work in [place] BMP/MG  Your physician recommends that you weigh, daily, at the same time every day, and in the same amount of clothing. Please record your daily weights AND CALL us IN 1 WEEK WITH THE READINGS.  Thank you for choosing [facility]!!

## 2017-07-17 ENCOUNTER — Encounter: Payer: Self-pay | Admitting: Cardiology

## 2017-07-22 ENCOUNTER — Telehealth: Payer: Self-pay | Admitting: Cardiology

## 2017-07-22 NOTE — Telephone Encounter (Signed)
LMTCB

## 2017-07-22 NOTE — Telephone Encounter (Signed)
Increase lasix to 40mg  daily, call us on Monday to update [place]  Zandra Abts MD

## 2017-07-22 NOTE — Telephone Encounter (Signed)
Daughter came in about her father is still swollen looking and does not think he is losing weight

## 2017-07-22 NOTE — Telephone Encounter (Signed)
Daughter states patient has not lost any weight from last office visit. Patient is still having swelling in his legs. Patient states he is taking lasix 20 mg daily

## 2017-07-23 NOTE — Telephone Encounter (Signed)
Daughter notified 

## 2017-07-27 ENCOUNTER — Telehealth: Payer: Self-pay | Admitting: *Deleted

## 2017-07-27 NOTE — Telephone Encounter (Signed)
Continue lasix and update [place] again after Christmas  Zandra Abts MD

## 2017-07-27 NOTE — Telephone Encounter (Signed)
Pt daughter says swelling in legs is much better - doesn't know pt weight as of today but has lost 2-3 lbs

## 2017-07-27 NOTE — Telephone Encounter (Signed)
Daughter informed and verbalized understanding of plan.

## 2017-07-27 NOTE — Telephone Encounter (Signed)
Swelling has improved not completely and is currently taking furosemide 40 mg daily. No chest pain, dizziness or sob.

## 2017-07-28 ENCOUNTER — Other Ambulatory Visit: Payer: Self-pay | Admitting: Cardiology

## 2017-07-28 DIAGNOSIS — R030 Elevated blood-pressure reading, without diagnosis of hypertension: Secondary | ICD-10-CM | POA: Diagnosis not present

## 2017-07-28 DIAGNOSIS — Z79899 Other long term (current) drug therapy: Secondary | ICD-10-CM | POA: Diagnosis not present

## 2017-07-29 LAB — MAGNESIUM: Magnesium: 1.9 mg/dL (ref 1.5–2.5)

## 2017-08-11 ENCOUNTER — Telehealth: Payer: Self-pay

## 2017-08-11 NOTE — Telephone Encounter (Signed)
LMTCB

## 2017-08-11 NOTE — Telephone Encounter (Signed)
Daughter called this morning as instructed per previous phone note. Daughter states all the swelling has went completely down. Daughter would like to know how to continue regarding medications

## 2017-08-11 NOTE — Telephone Encounter (Signed)
He needs to weigh himself today and write it down as well as notify us, this will be his "dry weight" and where we want him to stay. He can change his lasix to 40mg  only as needed for weight gain of 3 pounds or leg swelling. Now that he is off florinef the fluid may stay away on its own   Carlyle Dolly MD

## 2017-08-13 MED ORDER — FUROSEMIDE 40 MG PO TABS: 40.0000 mg | ORAL_TABLET | ORAL | 3 refills | Status: DC | PRN

## 2017-08-13 NOTE — Telephone Encounter (Signed)
Daughter notified and verbalized understanding.  

## 2017-12-02 DIAGNOSIS — R779 Abnormality of plasma protein, unspecified: Secondary | ICD-10-CM | POA: Diagnosis not present

## 2017-12-02 DIAGNOSIS — Z5181 Encounter for therapeutic drug level monitoring: Secondary | ICD-10-CM | POA: Diagnosis not present

## 2017-12-02 DIAGNOSIS — G7080 Lambert-Eaton syndrome, unspecified: Secondary | ICD-10-CM | POA: Diagnosis not present

## 2017-12-03 ENCOUNTER — Other Ambulatory Visit: Payer: Self-pay

## 2017-12-03 ENCOUNTER — Inpatient Hospital Stay (HOSPITAL_COMMUNITY): Payer: Medicare Other

## 2017-12-03 ENCOUNTER — Inpatient Hospital Stay (HOSPITAL_COMMUNITY): Payer: Medicare Other | Attending: Hematology | Admitting: Internal Medicine

## 2017-12-03 ENCOUNTER — Encounter (HOSPITAL_COMMUNITY): Payer: Self-pay | Admitting: Internal Medicine

## 2017-12-03 VITALS — BP 152/71 | HR 76 | Temp 98.4°F | Resp 18 | Wt 208.9 lb

## 2017-12-03 DIAGNOSIS — E538 Deficiency of other specified B group vitamins: Secondary | ICD-10-CM | POA: Diagnosis not present

## 2017-12-03 DIAGNOSIS — D509 Iron deficiency anemia, unspecified: Secondary | ICD-10-CM | POA: Diagnosis not present

## 2017-12-03 DIAGNOSIS — D500 Iron deficiency anemia secondary to blood loss (chronic): Secondary | ICD-10-CM

## 2017-12-03 LAB — CBC WITH DIFFERENTIAL/PLATELET
BASOS PCT: 1 %
Basophils Absolute: 0.1 10*3/uL (ref 0.0–0.1)
Eosinophils Absolute: 0.3 10*3/uL (ref 0.0–0.7)
Eosinophils Relative: 5 %
HEMATOCRIT: 36 % — AB (ref 39.0–52.0)
HEMOGLOBIN: 11.3 g/dL — AB (ref 13.0–17.0)
LYMPHS ABS: 1.5 10*3/uL (ref 0.7–4.0)
Lymphocytes Relative: 21 %
MCH: 22 pg — ABNORMAL LOW (ref 26.0–34.0)
MCHC: 31.4 g/dL (ref 30.0–36.0)
MCV: 70 fL — ABNORMAL LOW (ref 78.0–100.0)
Monocytes Absolute: 0.7 10*3/uL (ref 0.1–1.0)
Monocytes Relative: 10 %
NEUTROS ABS: 4.9 10*3/uL (ref 1.7–7.7)
NEUTROS PCT: 65 %
Platelets: 230 10*3/uL (ref 150–400)
RBC: 5.14 MIL/uL (ref 4.22–5.81)
RDW: 14.9 % (ref 11.5–15.5)
WBC: 7.5 10*3/uL (ref 4.0–10.5)

## 2017-12-03 LAB — COMPREHENSIVE METABOLIC PANEL
ALT: 12 U/L — ABNORMAL LOW (ref 17–63)
ANION GAP: 10 (ref 5–15)
AST: 23 U/L (ref 15–41)
Albumin: 3.9 g/dL (ref 3.5–5.0)
Alkaline Phosphatase: 75 U/L (ref 38–126)
BUN: 18 mg/dL (ref 6–20)
CHLORIDE: 99 mmol/L — AB (ref 101–111)
CO2: 28 mmol/L (ref 22–32)
Calcium: 9.1 mg/dL (ref 8.9–10.3)
Creatinine, Ser: 1.08 mg/dL (ref 0.61–1.24)
Glucose, Bld: 99 mg/dL (ref 65–99)
Potassium: 3.9 mmol/L (ref 3.5–5.1)
SODIUM: 137 mmol/L (ref 135–145)
Total Bilirubin: 0.8 mg/dL (ref 0.3–1.2)
Total Protein: 7.5 g/dL (ref 6.5–8.1)

## 2017-12-03 LAB — LACTATE DEHYDROGENASE: LDH: 134 U/L (ref 98–192)

## 2017-12-03 LAB — FERRITIN: FERRITIN: 97 ng/mL (ref 24–336)

## 2017-12-03 NOTE — Patient Instructions (Signed)
Exam and discussion today with Dr. Walden Field. Lab work today - we will call you with results. Return as scheduled for lab work and follow-up office visit.

## 2017-12-03 NOTE — Progress Notes (Signed)
Diagnosis Iron deficiency anemia due to chronic blood loss - Plan: CBC with Differential/Platelet, CBC with Differential/Platelet, Comprehensive metabolic panel, Lactate dehydrogenase, Ferritin, Ferritin, Lactate dehydrogenase, Comprehensive metabolic panel, CBC with Differential/Platelet, CBC with Differential/Platelet, CANCELED: Comprehensive metabolic panel, CANCELED: Lactate dehydrogenase, CANCELED: Ferritin, CANCELED: Ferritin, CANCELED: Lactate dehydrogenase, CANCELED: Comprehensive metabolic panel  Staging Cancer Staging No matching staging information was found for the patient.  Assessment and Plan: 1.  IDA.  Labs done today 12/03/2017 show white count 7.5 hemoglobin 11.3 platelets 230,000.  Chemistries within normal limits.  Patient was last treated with IV iron in November 2018.  Hemoglobin is stable at 11.  He will return to clinic in 6 months for follow-up and repeat labs.   He will be notified of ferritin results.    2.  B12 deficiency.  Continue monthly B12 supplements.   3.  Microcytic anemia.  Patient had a reported positive sickle cell screen.  Daughter has sickle cell anemia and son has sickle cell trait.  Will repeat hemoglobin electrophoresis on return to clinic.  4.  Health maintenance.  He reports he has undergone a GI evaluation.  Current Status: Pt seen today for follow-up.  He is here today to go over labs.   Problem List Patient Active Problem List   Diagnosis Date Noted  . Iron deficiency anemia [D50.9] 06/04/2016  . Microcytic anemia [D50.9] 06/25/2015  . 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 B12 deficiency [E53.8] 06/25/2015  . Loss of weight [R63.4] 10/26/2014  . Dysphagia, pharyngoesophageal phase [R13.14] 10/26/2014  . Low back pain [M54.5] 07/10/2014  . Orthostatic hypotension [I95.1] 07/07/2014  . Orthostasis [I95.1] 07/07/2014    Past Medical History Past Medical History:  Diagnosis Date  . B12 deficiency  06/25/2015  . Chronic back pain   . COPD (chronic obstructive pulmonary disease) ([place])   . DDD (degenerative disc disease), lumbar   . GERD (gastroesophageal reflux disease)   . Iron deficiency anemia 06/04/2016  . 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 Low blood pressure   . Microcytic anemia 06/25/2015  . Seizures ([place])    in remote past   . Shortness of breath dyspnea     Past Surgical History Past Surgical History:  Procedure Laterality Date  . COLONOSCOPY N/A 11/12/2014   Dr. Clayburn Pert external and internal hemorrhoids, redundant left colon  . ESOPHAGEAL DILATION N/A 11/12/2014   Procedure: ESOPHAGEAL DILATION;  Surgeon: Danie Binder, MD;  [facility];  Service: Endoscopy;  Laterality: N/A;  . ESOPHAGOGASTRODUODENOSCOPY N/A 11/12/2014   Dr. Fields:mild non-erosive gastritis. negative H.pylori   . None      Family History Family History  Problem Relation Age of Onset  . Stomach cancer Maternal Grandfather   . Sickle cell anemia Daughter   . Colon cancer Neg Hx      Social History  reports that he quit smoking about 34 years ago. His smoking use included cigarettes. He started smoking about 54 years ago. He has a 10.50 pack-year smoking history. He has never used smokeless tobacco. He reports that he does not drink alcohol or use drugs.  Medications  Current Outpatient Medications:  .  acetaminophen (TYLENOL) 500 MG tablet, Take 500 mg by mouth every 6 (six) hours as needed., Disp: , Rfl:  .  Amifampridine Phosphate (FIRDAPSE) 10 MG TABS, Take 15 mg by mouth 3 (three) times daily., Disp: , Rfl:  .  aspirin EC 81 MG tablet, Take 81 mg by mouth daily., Disp: ,  Rfl:  .  DULoxetine (CYMBALTA) 20 MG capsule, 40 mg daily. , Disp: , Rfl:  .  furosemide (LASIX) 40 MG tablet, Take 1 tablet (40 mg total) by mouth as needed (For 3 pound weight gain or leg swelling). 08/13/17 DOSE CHANGE, Disp: 30 tablet, Rfl: 3 .  midodrine (PROAMATINE) 10 MG  tablet, TAKE ONE TABLET BY MOUTH THREE TIMES DAILY, Disp: 270 tablet, Rfl: 3 .  MISC NATURAL PRODUCTS PO, Take by mouth 3 (three) times daily. DAP - experimental drug from [facility] 02/12/17 for Lambert-Eaton Myasthenic Syndrome, Disp: , Rfl:  .  trolamine salicylate (ASPERCREME) 10 % cream, Apply 1 application topically as needed for muscle pain., Disp: , Rfl:  .  vitamin B-12 (CYANOCOBALAMIN) 1000 MCG tablet, Take 1,000 mcg by mouth daily., Disp: , Rfl:  .  Zinc 50 MG CAPS, Take 50 mg by mouth every morning., Disp: , Rfl:   Allergies Patient has no known allergies.  Review of Systems Review of Systems - Oncology ROS as per HPI otherwise 12 point ROS is negative.   Physical Exam  Vitals Wt Readings from Last 3 Encounters:  12/03/17 208 lb 14.4 oz (94.8 kg)  07/14/17 213 lb (96.6 kg)  06/04/17 200 lb 12.8 oz (91.1 kg)   Temp Readings from Last 3 Encounters:  12/03/17 98.4 F (36.9 C) (Oral)  06/15/17 98.1 F (36.7 C) (Oral)  06/04/17 98.2 F (36.8 C) (Oral)   BP Readings from Last 3 Encounters:  12/03/17 (!) 152/71  07/14/17 140/68  06/15/17 (!) 154/69   Pulse Readings from Last 3 Encounters:  12/03/17 76  07/14/17 98  06/15/17 70   Constitutional: Well-developed, well-nourished, and in no distress.   HENT: Head: Normocephalic and atraumatic.  Mouth/Throat: No oropharyngeal exudate. Mucosa moist. Eyes: Pupils are equal, round, and reactive to light. Conjunctivae are normal. No scleral icterus.  Neck: Normal range of motion. Neck supple. No JVD present.  Cardiovascular: Normal rate, regular rhythm and normal heart sounds.  Exam reveals no gallop and no friction rub.   No murmur heard. Pulmonary/Chest: Effort normal and breath sounds normal. No respiratory distress. No wheezes.No rales.  Abdominal: Soft. Bowel sounds are normal. No distension. There is no tenderness. There is no guarding.  Musculoskeletal: No edema or tenderness.  Lymphadenopathy: No cervical, axillary  or supraclavicular adenopathy.  Neurological: Alert and oriented to person, place, and time. No cranial nerve deficit.  Skin: Skin is warm and dry. No rash noted. No erythema. No pallor.  Psychiatric: Affect and judgment normal.   Labs Office Visit on 12/03/2017  Component Date Value Ref Range Status  . LDH 12/03/2017 134  98 - 192 U/L Final   Performed at [facility], [address] [place], [place] [address]  . Sodium 12/03/2017 137  135 - 145 mmol/L Final  . Potassium 12/03/2017 3.9  3.5 - 5.1 mmol/L Final  . Chloride 12/03/2017 99* 101 - 111 mmol/L Final  . CO2 12/03/2017 28  22 - 32 mmol/L Final  . Glucose, Bld 12/03/2017 99  65 - 99 mg/dL Final  . BUN 12/03/2017 18  6 - 20 mg/dL Final  . Creatinine, Ser 12/03/2017 1.08  0.61 - 1.24 mg/dL Final  . Calcium 12/03/2017 9.1  8.9 - 10.3 mg/dL Final  . Total Protein 12/03/2017 7.5  6.5 - 8.1 g/dL Final  . Albumin 12/03/2017 3.9  3.5 - 5.0 g/dL Final  . AST 12/03/2017 23  15 - 41 U/L Final  . ALT 12/03/2017 12* 17 -  63 U/L Final  . Alkaline Phosphatase 12/03/2017 75  38 - 126 U/L Final  . Total Bilirubin 12/03/2017 0.8  0.3 - 1.2 mg/dL Final  . GFR calc non Af Amer 12/03/2017 >60  >60 mL/min Final  . GFR calc Af Amer 12/03/2017 >60  >60 mL/min Final   Comment: (NOTE) The eGFR has been calculated using the CKD EPI equation. This calculation has not been validated in all clinical situations. eGFR's persistently <60 mL/min signify possible Chronic Kidney Disease.   Georgiann Hahn gap 12/03/2017 10  5 - 15 Final   Performed at [facility] [facility], [address], [place], [place] [address]  . WBC 12/03/2017 7.5  4.0 - 10.5 K/uL Final  . RBC 12/03/2017 5.14  4.22 - 5.81 MIL/uL Final  . Hemoglobin 12/03/2017 11.3* 13.0 - 17.0 g/dL Final  . HCT 12/03/2017 36.0* 39.0 - 52.0 % Final  . MCV 12/03/2017 70.0* 78.0 - 100.0 fL Final  . MCH 12/03/2017 22.0* 26.0 - 34.0 pg Final  . MCHC 12/03/2017 31.4  30.0 - 36.0 g/dL Final  . RDW 12/03/2017 14.9  11.5 -  15.5 % Final  . Platelets 12/03/2017 230  150 - 400 K/uL Final  . Neutrophils Relative % 12/03/2017 65  % Final  . Neutro Abs 12/03/2017 4.9  1.7 - 7.7 K/uL Final  . Lymphocytes Relative 12/03/2017 21  % Final  . Lymphs Abs 12/03/2017 1.5  0.7 - 4.0 K/uL Final  . Monocytes Relative 12/03/2017 10  % Final  . Monocytes Absolute 12/03/2017 0.7  0.1 - 1.0 K/uL Final  . Eosinophils Relative 12/03/2017 5  % Final  . Eosinophils Absolute 12/03/2017 0.3  0.0 - 0.7 K/uL Final  . Basophils Relative 12/03/2017 1  % Final  . Basophils Absolute 12/03/2017 0.1  0.0 - 0.1 K/uL Final   Performed at [facility], [address], [place], [place] [address]     Pathology Orders Placed This Encounter  Procedures  . CBC with Differential/Platelet    Standing Status:   Future    Number of Occurrences:   1    Standing Expiration Date:   12/04/2018  . CBC with Differential/Platelet    Standing Status:   Future    Number of Occurrences:   1    Standing Expiration Date:   12/04/2018  . Comprehensive metabolic panel    Standing Status:   Future    Number of Occurrences:   1    Standing Expiration Date:   12/04/2018  . Lactate dehydrogenase    Standing Status:   Future    Number of Occurrences:   1    Standing Expiration Date:   12/04/2018  . Ferritin    Standing Status:   Future    Number of Occurrences:   1    Standing Expiration Date:   12/04/2018       Zoila Shutter MD

## 2017-12-06 ENCOUNTER — Telehealth (HOSPITAL_COMMUNITY): Payer: Self-pay

## 2017-12-06 ENCOUNTER — Encounter: Payer: Self-pay | Admitting: Gastroenterology

## 2017-12-06 NOTE — Telephone Encounter (Signed)
Left message for patient that his hemoglobin was normal per Dr. Walden Field. Instructed him to call back if he had any concerns or questions

## 2018-01-07 ENCOUNTER — Ambulatory Visit (INDEPENDENT_AMBULATORY_CARE_PROVIDER_SITE_OTHER): Payer: Medicare Other | Admitting: Cardiology

## 2018-01-07 ENCOUNTER — Other Ambulatory Visit: Payer: Self-pay

## 2018-01-07 ENCOUNTER — Encounter: Payer: Self-pay | Admitting: Cardiology

## 2018-01-07 VITALS — BP 156/81 | HR 81 | Ht 72.0 in | Wt 209.0 lb

## 2018-01-07 DIAGNOSIS — I951 Orthostatic hypotension: Secondary | ICD-10-CM | POA: Diagnosis not present

## 2018-01-07 MED ORDER — MIDODRINE HCL 5 MG PO TABS: 5.0000 mg | ORAL_TABLET | Freq: Three times a day (TID) | ORAL | 1 refills | Status: DC

## 2018-01-07 MED ORDER — FUROSEMIDE 40 MG PO TABS: ORAL_TABLET | ORAL | 1 refills | Status: DC

## 2018-01-07 NOTE — Progress Notes (Signed)
Clinical Summary Erik [name] is a 72 y.o.male seen today for follow up of the following medical problems.  1. Orthostatic hypotension -florinef stopped due to recent issues with weight gain and edema. - swelling at times, better than before. Only takes lasix about 2 times a week - home weights stable around 200 lbs.  - no recent dizziness. No recent SOB/DOE - his neurologist has recommended weaning his midodrine.   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 B12 deficiency 06/25/2015  . Chronic back pain   . COPD (chronic obstructive pulmonary disease) ([place])   . DDD (degenerative disc disease), lumbar   . GERD (gastroesophageal reflux disease)   . Iron deficiency anemia 06/04/2016  . LEMS (Lambert-Eaton myasthenic syndrome) ([place]) 06/25/2015  . Low blood pressure   . Microcytic anemia 06/25/2015  . Seizures ([place])    in remote past   . Shortness of breath dyspnea      No Known Allergies   Current Outpatient Medications  Medication Sig Dispense Refill  . acetaminophen (TYLENOL) 500 MG tablet Take 500 mg by mouth every 6 (six) hours as needed.    . Amifampridine Phosphate (FIRDAPSE) 10 MG TABS Take 15 mg by mouth 3 (three) times daily.    Marland Kitchen aspirin EC 81 MG tablet Take 81 mg by mouth daily.    . DULoxetine (CYMBALTA) 20 MG capsule 40 mg daily.     . furosemide (LASIX) 40 MG tablet Take 1 tablet (40 mg total) by mouth as needed (For 3 pound weight gain or leg swelling). 08/13/17 DOSE CHANGE 30 tablet 3  . midodrine (PROAMATINE) 10 MG tablet TAKE ONE TABLET BY MOUTH THREE TIMES DAILY 270 tablet 3  . MISC NATURAL PRODUCTS PO Take by mouth 3 (three) times daily. DAP - experimental drug from [facility] 02/12/17 for Lambert-Eaton Myasthenic Syndrome    . trolamine salicylate (ASPERCREME) 10 % cream Apply 1 application topically as needed for muscle pain.    . vitamin B-12 (CYANOCOBALAMIN) 1000 MCG tablet Take 1,000  mcg by mouth daily.    . Zinc 50 MG CAPS Take 50 mg by mouth every morning.     No current facility-administered medications for this visit.      Past Surgical History:  Procedure Laterality Date  . COLONOSCOPY N/A 11/12/2014   Dr. Clayburn Pert external and internal hemorrhoids, redundant left colon  . ESOPHAGEAL DILATION N/A 11/12/2014   Procedure: ESOPHAGEAL DILATION;  Surgeon: Danie Binder, MD;  [facility];  Service: Endoscopy;  Laterality: N/A;  . ESOPHAGOGASTRODUODENOSCOPY N/A 11/12/2014   Dr. Fields:mild non-erosive gastritis. negative H.pylori   . None       No Known Allergies    Family History  Problem Relation Age of Onset  . Stomach cancer Maternal Grandfather   . Sickle cell anemia Daughter   . Colon cancer Neg Hx      Social History Erik [name] reports that he quit smoking about 34 years ago. His smoking use included cigarettes. He started smoking about 54 years ago. He has a 10.50 pack-year smoking history. He has never used smokeless tobacco. Erik [name] reports that he does not drink alcohol.   Review of Systems CONSTITUTIONAL: No weight loss, fever, chills, weakness or fatigue.  HEENT: Eyes: No visual loss, blurred vision, double vision or yellow sclerae.No hearing loss, sneezing, congestion, runny nose or sore throat.  SKIN: No rash or itching.  CARDIOVASCULAR:  per hpi RESPIRATORY: No shortness of breath, cough or sputum.  GASTROINTESTINAL: No anorexia, nausea, vomiting or diarrhea. No abdominal pain or blood.  GENITOURINARY: No burning on urination, no polyuria NEUROLOGICAL: No headache, dizziness, syncope, paralysis, ataxia, numbness or tingling in the extremities. No change in bowel or bladder control.  MUSCULOSKELETAL: No muscle, back pain, joint pain or stiffness.  LYMPHATICS: No enlarged nodes. No history of splenectomy.  PSYCHIATRIC: No history of depression or anxiety.  ENDOCRINOLOGIC: No reports of sweating, cold or heat  intolerance. No polyuria or polydipsia.  Marland Kitchen   Physical Examination Vitals:   01/07/18 1555  BP: (!) 156/81  Pulse: 81  SpO2: 96%   Vitals:   01/07/18 1555  Weight: 209 lb (94.8 kg)  Height: 6' (1.829 m)    Gen: resting comfortably, no acute distress HEENT: no scleral icterus, pupils equal round and reactive, no palptable cervical adenopathy,  CV: RRR, no m//r/g, no jvd Resp: Clear to auscultation bilaterally GI: abdomen is soft, non-tender, non-distended, normal bowel sounds, no hepatosplenomegaly MSK: extremities are warm, trace bilateral edema Skin: warm, no rash Neuro:  no focal deficits Psych: appropriate affect   Diagnostic Studies 06/2014 echo Study Conclusions  - Left ventricle: The cavity size was normal. Wall thickness was normal. Systolic function was normal. The estimated ejection fraction was in the range of 55% to 60%. Wall motion was normal; there were no regional wall motion abnormalities. Doppler parameters are consistent with abnormal left ventricular relaxation (grade 1 diastolic dysfunction). - Aortic valve: Trileaflet; mildly thickened leaflets. - Mitral valve: Mildly thickened leaflets . There was trivial regurgitation. - Right atrium: Central venous pressure (est): 3 mm Hg. - Atrial septum: No defect or patent foramen ovale was identified. - Tricuspid valve: There was mild regurgitation. - Pulmonary arteries: PA peak pressure: 34 mm Hg (S). - Pericardium, extracardiac: There was no pericardial effusion.  Impressions:  - Normal LV wall thickness with LVEF 99-83%, grade 1 diastolic dysfunction. Mildly thickened aortic valve. Trivial mitral and mild regurgitation. PASP 34 mmHg.   Jan 2018 [facility] [place] No aneurysm    Assessment and Plan  1. Orthostatiic hypotension -- florinef stopped due to significant edema and weight gain - his neurologist has recommended weaning his midodrine due to elevated bp's. We will try taking 5mg   midodrine bid.  - change lasix MWF  F/u 6 months       Arnoldo Lenis, M.D.

## 2018-01-07 NOTE — Patient Instructions (Signed)
Your physician wants you to follow-up in: [place] will receive a reminder letter in the mail two months in advance. If you don't receive a letter, please call our office to schedule the follow-up appointment.  Your physician has recommended you make the following change in your medication:   DECREASE MIDODRINE 5 MG 3 TIMES DAILY  CHANGE LASIX 40 MG ON MONDAYS WEDNESDAYS AND FRIDAYS - MAY TAKE ADDITIONAL 40 MG AS NEEDED FOR SWELLING  Thank you for choosing [facility]!!

## 2018-01-13 ENCOUNTER — Encounter: Payer: Self-pay | Admitting: Cardiology

## 2018-02-08 ENCOUNTER — Other Ambulatory Visit: Payer: Self-pay | Admitting: Cardiology

## 2018-05-30 ENCOUNTER — Other Ambulatory Visit (HOSPITAL_COMMUNITY): Payer: Medicare Other

## 2018-06-02 DIAGNOSIS — M6281 Muscle weakness (generalized): Secondary | ICD-10-CM | POA: Diagnosis not present

## 2018-06-02 DIAGNOSIS — C3490 Malignant neoplasm of unspecified part of unspecified bronchus or lung: Secondary | ICD-10-CM | POA: Diagnosis not present

## 2018-06-02 DIAGNOSIS — G709 Myoneural disorder, unspecified: Secondary | ICD-10-CM | POA: Diagnosis not present

## 2018-06-02 DIAGNOSIS — R779 Abnormality of plasma protein, unspecified: Secondary | ICD-10-CM | POA: Diagnosis not present

## 2018-06-02 DIAGNOSIS — Z87891 Personal history of nicotine dependence: Secondary | ICD-10-CM | POA: Diagnosis not present

## 2018-06-02 DIAGNOSIS — G7080 Lambert-Eaton syndrome, unspecified: Secondary | ICD-10-CM | POA: Diagnosis not present

## 2018-06-06 ENCOUNTER — Inpatient Hospital Stay (HOSPITAL_COMMUNITY): Payer: Medicare Other

## 2018-06-06 ENCOUNTER — Other Ambulatory Visit: Payer: Self-pay

## 2018-06-06 ENCOUNTER — Encounter (HOSPITAL_COMMUNITY): Payer: Self-pay | Admitting: Internal Medicine

## 2018-06-06 ENCOUNTER — Inpatient Hospital Stay (HOSPITAL_COMMUNITY): Payer: Medicare Other | Attending: Hematology | Admitting: Internal Medicine

## 2018-06-06 VITALS — BP 152/63 | HR 82 | Temp 97.9°F | Resp 18 | Wt 213.0 lb

## 2018-06-06 DIAGNOSIS — I951 Orthostatic hypotension: Secondary | ICD-10-CM

## 2018-06-06 DIAGNOSIS — D509 Iron deficiency anemia, unspecified: Secondary | ICD-10-CM

## 2018-06-06 DIAGNOSIS — E538 Deficiency of other specified B group vitamins: Secondary | ICD-10-CM | POA: Diagnosis not present

## 2018-06-06 DIAGNOSIS — D500 Iron deficiency anemia secondary to blood loss (chronic): Secondary | ICD-10-CM

## 2018-06-06 NOTE — Progress Notes (Signed)
Diagnosis Iron deficiency anemia due to chronic blood loss - Plan: CBC with Differential/Platelet, Comprehensive metabolic panel, Lactate dehydrogenase, Ferritin  Staging Cancer Staging No matching staging information was found for the patient.  Assessment and Plan:  1.  IDA.  Labs done 12/03/2017 remain stable with HB 11.3.  Patient was last treated with IV iron in November 2018.  He will have repeat labs in 11/2018.    2.  B12 deficiency.  Continue monthly B12 supplements.   3.  Microcytic anemia.  Patient had a reported positive sickle cell screen.  Daughter has sickle cell anemia and son has sickle cell trait.  Hemoglobin electrophoresis pending.    4.  Health maintenance. Continue GI follow-up as recommended.    Current Status: Pt seen today for follow-up.   Problem List Patient Active Problem List   Diagnosis Date Noted  . Iron deficiency anemia [D50.9] 06/04/2016  . Microcytic anemia [D50.9] 06/25/2015  . 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 B12 deficiency [E53.8] 06/25/2015  . Loss of weight [R63.4] 10/26/2014  . Dysphagia, pharyngoesophageal phase [R13.14] 10/26/2014  . Low back pain [M54.5] 07/10/2014  . Orthostatic hypotension [I95.1] 07/07/2014  . Orthostasis [I95.1] 07/07/2014    Past Medical History Past Medical History:  Diagnosis Date  . B12 deficiency 06/25/2015  . Chronic back pain   . COPD (chronic obstructive pulmonary disease) ([place])   . DDD (degenerative disc disease), lumbar   . GERD (gastroesophageal reflux disease)   . Iron deficiency anemia 06/04/2016  . LEMS (Lambert-Eaton myasthenic syndrome) ([place]) 06/25/2015  . Low blood pressure   . Microcytic anemia 06/25/2015  . Seizures ([place])    in remote past   . Shortness of breath dyspnea     Past Surgical History Past Surgical History:  Procedure Laterality Date  . COLONOSCOPY N/A 11/12/2014   Dr. Clayburn Pert external and internal hemorrhoids, redundant left colon   . ESOPHAGEAL DILATION N/A 11/12/2014   Procedure: ESOPHAGEAL DILATION;  Surgeon: Danie Binder, MD;  [facility];  Service: Endoscopy;  Laterality: N/A;  . ESOPHAGOGASTRODUODENOSCOPY N/A 11/12/2014   Dr. Fields:mild non-erosive gastritis. negative H.pylori   . None      Family History Family History  Problem Relation Age of Onset  . Stomach cancer Maternal Grandfather   . Sickle cell anemia Daughter   . Colon cancer Neg Hx      Social History  reports that he quit smoking about 34 years ago. His smoking use included cigarettes. He started smoking about 55 years ago. He has a 10.50 pack-year smoking history. He has never used smokeless tobacco. He reports that he does not drink alcohol or use drugs.  Medications  Current Outpatient Medications:  .  acetaminophen (TYLENOL) 500 MG tablet, Take 500 mg by mouth every 6 (six) hours as needed., Disp: , Rfl:  .  Amifampridine Phosphate (FIRDAPSE) 10 MG TABS, Take 15 mg by mouth 3 (three) times daily., Disp: , Rfl:  .  aspirin EC 81 MG tablet, Take 81 mg by mouth daily., Disp: , Rfl:  .  furosemide (LASIX) 40 MG tablet, TAKE 1 TABLET Monday Wednesday AND Friday MAY TAKE ADDITIONAL AS NEEDED FOR SWELLING, Disp: 90 tablet, Rfl: 1 .  midodrine (PROAMATINE) 5 MG tablet, Take 1 tablet (5 mg total) by mouth 3 (three) times daily with meals., Disp: 270 tablet, Rfl: 1 .  trolamine salicylate (ASPERCREME) 10 % cream, Apply 1 application topically as needed for muscle pain., Disp: , Rfl:  .  vitamin B-12 (CYANOCOBALAMIN) 1000 MCG tablet, Take 1,000 mcg by mouth daily., Disp: , Rfl:  .  Zinc 50 MG CAPS, Take 50 mg by mouth every morning., Disp: , Rfl:   Allergies Patient has no known allergies.  Review of Systems Review of Systems - Oncology ROS negative   Physical Exam  Vitals Wt Readings from Last 3 Encounters:  06/06/18 213 lb (96.6 kg)  01/07/18 209 lb (94.8 kg)  12/03/17 208 lb 14.4 oz (94.8 kg)   Temp Readings from Last 3  Encounters:  06/06/18 97.9 F (36.6 C) (Oral)  12/03/17 98.4 F (36.9 C) (Oral)  06/15/17 98.1 F (36.7 C) (Oral)   BP Readings from Last 3 Encounters:  06/06/18 (!) 152/63  01/07/18 (!) 156/81  12/03/17 (!) 152/71   Pulse Readings from Last 3 Encounters:  06/06/18 82  01/07/18 81  12/03/17 76    Constitutional: Well-developed, well-nourished, and in no distress.   HENT: Head: Normocephalic and atraumatic.  Mouth/Throat: No oropharyngeal exudate. Mucosa moist. Eyes: Pupils are equal, round, and reactive to light. Conjunctivae are normal. No scleral icterus.  Neck: Normal range of motion. Neck supple. No JVD present.  Cardiovascular: Normal rate, regular rhythm and normal heart sounds.  Exam reveals no gallop and no friction rub.   No murmur heard. Pulmonary/Chest: Effort normal and breath sounds normal. No respiratory distress. No wheezes.No rales.  Abdominal: Soft. Bowel sounds are normal. No distension. There is no tenderness. There is no guarding.  Musculoskeletal: No edema or tenderness.  Lymphadenopathy: No cervical, axillary or supraclavicular adenopathy.  Neurological: Alert and oriented to person, place, and time. No cranial nerve deficit.  Skin: Skin is warm and dry. No rash noted. No erythema. No pallor.  Psychiatric: Affect and judgment normal.   Labs No visits with results within 3 Day(s) from this visit.  Latest known visit with results is:  Office Visit on 12/03/2017  Component Date Value Ref Range Status  . Ferritin 12/03/2017 97  24 - 336 ng/mL Final   Performed at [facility] [address] [place], [place] [address]  . LDH 12/03/2017 134  98 - 192 U/L Final   Performed at [facility], [address], [place],  [address]  . Sodium 12/03/2017 137  135 - 145 mmol/L Final  . Potassium 12/03/2017 3.9  3.5 - 5.1 mmol/L Final  . Chloride 12/03/2017 99* 101 - 111 mmol/L Final  . CO2 12/03/2017 28  22 - 32 mmol/L Final  . Glucose, Bld 12/03/2017  99  65 - 99 mg/dL Final  . BUN 12/03/2017 18  6 - 20 mg/dL Final  . Creatinine, Ser 12/03/2017 1.08  0.61 - 1.24 mg/dL Final  . Calcium 12/03/2017 9.1  8.9 - 10.3 mg/dL Final  . Total Protein 12/03/2017 7.5  6.5 - 8.1 g/dL Final  . Albumin 12/03/2017 3.9  3.5 - 5.0 g/dL Final  . AST 12/03/2017 23  15 - 41 U/L Final  . ALT 12/03/2017 12* 17 - 63 U/L Final  . Alkaline Phosphatase 12/03/2017 75  38 - 126 U/L Final  . Total Bilirubin 12/03/2017 0.8  0.3 - 1.2 mg/dL Final  . GFR calc non Af Amer 12/03/2017 >60  >60 mL/min Final  . GFR calc Af Amer 12/03/2017 >60  >60 mL/min Final   Comment: (NOTE) The eGFR has been calculated using the CKD EPI equation. This calculation has not been validated in all clinical situations. eGFR's persistently <60 mL/min signify possible Chronic Kidney Disease.   Marland Kitchen  Anion gap 12/03/2017 10  5 - 15 Final   Performed at [facility], [address] [place], [place] [address]  . WBC 12/03/2017 7.5  4.0 - 10.5 K/uL Final  . RBC 12/03/2017 5.14  4.22 - 5.81 MIL/uL Final  . Hemoglobin 12/03/2017 11.3* 13.0 - 17.0 g/dL Final  . HCT 12/03/2017 36.0* 39.0 - 52.0 % Final  . MCV 12/03/2017 70.0* 78.0 - 100.0 fL Final  . MCH 12/03/2017 22.0* 26.0 - 34.0 pg Final  . MCHC 12/03/2017 31.4  30.0 - 36.0 g/dL Final  . RDW 12/03/2017 14.9  11.5 - 15.5 % Final  . Platelets 12/03/2017 230  150 - 400 K/uL Final  . Neutrophils Relative % 12/03/2017 65  % Final  . Neutro Abs 12/03/2017 4.9  1.7 - 7.7 K/uL Final  . Lymphocytes Relative 12/03/2017 21  % Final  . Lymphs Abs 12/03/2017 1.5  0.7 - 4.0 K/uL Final  . Monocytes Relative 12/03/2017 10  % Final  . Monocytes Absolute 12/03/2017 0.7  0.1 - 1.0 K/uL Final  . Eosinophils Relative 12/03/2017 5  % Final  . Eosinophils Absolute 12/03/2017 0.3  0.0 - 0.7 K/uL Final  . Basophils Relative 12/03/2017 1  % Final  . Basophils Absolute 12/03/2017 0.1  0.0 - 0.1 K/uL Final   Performed at [facility], [address] [place],  [place] [address]     Pathology Orders Placed This Encounter  Procedures  . CBC with Differential/Platelet    Standing Status:   Future    Standing Expiration Date:   06/06/2020  . Comprehensive metabolic panel    Standing Status:   Future    Standing Expiration Date:   06/06/2020  . Lactate dehydrogenase    Standing Status:   Future    Standing Expiration Date:   06/06/2020  . Ferritin    Standing Status:   Future    Standing Expiration Date:   06/06/2020       Zoila Shutter MD

## 2018-06-07 LAB — HEMOGLOBINOPATHY EVALUATION
HGB C: 0 %
HGB VARIANT: 0 %
Hgb A2 Quant: 4 % — ABNORMAL HIGH (ref 1.8–3.2)
Hgb A: 71.4 % — ABNORMAL LOW (ref 96.4–98.8)
Hgb F Quant: 0 % (ref 0.0–2.0)
Hgb S Quant: 24.6 % — ABNORMAL HIGH

## 2018-07-06 DIAGNOSIS — H52222 Regular astigmatism, left eye: Secondary | ICD-10-CM | POA: Diagnosis not present

## 2018-07-06 DIAGNOSIS — H11001 Unspecified pterygium of right eye: Secondary | ICD-10-CM | POA: Diagnosis not present

## 2018-07-06 DIAGNOSIS — H5201 Hypermetropia, right eye: Secondary | ICD-10-CM | POA: Diagnosis not present

## 2018-07-06 DIAGNOSIS — H25813 Combined forms of age-related cataract, bilateral: Secondary | ICD-10-CM | POA: Diagnosis not present

## 2018-07-14 ENCOUNTER — Encounter: Payer: Self-pay | Admitting: Cardiology

## 2018-07-14 ENCOUNTER — Ambulatory Visit (INDEPENDENT_AMBULATORY_CARE_PROVIDER_SITE_OTHER): Payer: Medicare Other | Admitting: Cardiology

## 2018-07-14 VITALS — BP 138/70 | HR 73 | Ht 72.0 in | Wt 213.2 lb

## 2018-07-14 DIAGNOSIS — I951 Orthostatic hypotension: Secondary | ICD-10-CM | POA: Diagnosis not present

## 2018-07-14 DIAGNOSIS — R600 Localized edema: Secondary | ICD-10-CM

## 2018-07-14 NOTE — Patient Instructions (Signed)

## 2018-07-14 NOTE — Progress Notes (Signed)
Clinical Summary Erik [name] is a 72 y.o.male  seen today for follow up of the following medical problems.  1. Orthostatic hypotension -florinef stopped due to  issues with weight gain and edema. - last visit weaned midodrine to 5mg  tid due to supine HTN.  - no recent symptoms - takes lasix on MWF, controls swelling.    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 B12 deficiency 06/25/2015  . Chronic back pain   . COPD (chronic obstructive pulmonary disease) ([place])   . DDD (degenerative disc disease), lumbar   . GERD (gastroesophageal reflux disease)   . Iron deficiency anemia 06/04/2016  . LEMS (Lambert-Eaton myasthenic syndrome) ([place]) 06/25/2015  . Low blood pressure   . Microcytic anemia 06/25/2015  . Seizures ([place])    in remote past   . Shortness of breath dyspnea      No Known Allergies   Current Outpatient Medications  Medication Sig Dispense Refill  . acetaminophen (TYLENOL) 500 MG tablet Take 500 mg by mouth every 6 (six) hours as needed.    . Amifampridine Phosphate (FIRDAPSE) 10 MG TABS Take 15 mg by mouth 3 (three) times daily.    Marland Kitchen aspirin EC 81 MG tablet Take 81 mg by mouth daily.    . furosemide (LASIX) 40 MG tablet TAKE 1 TABLET Monday Wednesday AND Friday MAY TAKE ADDITIONAL AS NEEDED FOR SWELLING 90 tablet 1  . midodrine (PROAMATINE) 5 MG tablet Take 1 tablet (5 mg total) by mouth 3 (three) times daily with meals. 270 tablet 1  . trolamine salicylate (ASPERCREME) 10 % cream Apply 1 application topically as needed for muscle pain.    . vitamin B-12 (CYANOCOBALAMIN) 1000 MCG tablet Take 1,000 mcg by mouth daily.    . Zinc 50 MG CAPS Take 50 mg by mouth every morning.     No current facility-administered medications for this visit.      Past Surgical History:  Procedure Laterality Date  . COLONOSCOPY N/A 11/12/2014   Dr. Clayburn Pert external and internal hemorrhoids, redundant left colon   . ESOPHAGEAL DILATION N/A 11/12/2014   Procedure: ESOPHAGEAL DILATION;  Surgeon: Danie Binder, MD;  [facility];  Service: Endoscopy;  Laterality: N/A;  . ESOPHAGOGASTRODUODENOSCOPY N/A 11/12/2014   Dr. Fields:mild non-erosive gastritis. negative H.pylori   . None       No Known Allergies    Family History  Problem Relation Age of Onset  . Stomach cancer Maternal Grandfather   . Sickle cell anemia Daughter   . Colon cancer Neg Hx      Social History Erik [name] reports that he quit smoking about 34 years ago. His smoking use included cigarettes. He started smoking about 55 years ago. He has a 10.50 pack-year smoking history. He has never used smokeless tobacco. Erik [name] reports that he does not drink alcohol.   Review of Systems CONSTITUTIONAL: No weight loss, fever, chills, weakness or fatigue.  HEENT: Eyes: No visual loss, blurred vision, double vision or yellow sclerae.No hearing loss, sneezing, congestion, runny nose or sore throat.  SKIN: No rash or itching.  CARDIOVASCULAR: per hpi RESPIRATORY: No shortness of breath, cough or sputum.  GASTROINTESTINAL: No anorexia, nausea, vomiting or diarrhea. No abdominal pain or blood.  GENITOURINARY: No burning on urination, no polyuria NEUROLOGICAL: No headache, dizziness, syncope, paralysis, ataxia, numbness or tingling in the extremities. No change in bowel or bladder control.  MUSCULOSKELETAL: No  muscle, back pain, joint pain or stiffness.  LYMPHATICS: No enlarged nodes. No history of splenectomy.  PSYCHIATRIC: No history of depression or anxiety.  ENDOCRINOLOGIC: No reports of sweating, cold or heat intolerance. No polyuria or polydipsia.  Marland Kitchen   Physical Examination Vitals:   07/14/18 1508  BP: 138/70  Pulse: 73  SpO2: 95%   Vitals:   07/14/18 1508  Weight: 213 lb 3.2 oz (96.7 kg)  Height: 6' (1.829 m)    Gen: resting comfortably, no acute distress HEENT: no scleral icterus, pupils equal round  and reactive, no palptable cervical adenopathy,  CV: RRR, no m/r/g, no jvd Resp: Clear to auscultation bilaterally GI: abdomen is soft, non-tender, non-distended, normal bowel sounds, no hepatosplenomegaly MSK: extremities are warm, no edema.  Skin: warm, no rash Neuro:  no focal deficits Psych: appropriate affect   Diagnostic Studies  06/2014 echo Study Conclusions  - Left ventricle: The cavity size was normal. Wall thickness was normal. Systolic function was normal. The estimated ejection fraction was in the range of 55% to 60%. Wall motion was normal; there were no regional wall motion abnormalities. Doppler parameters are consistent with abnormal left ventricular relaxation (grade 1 diastolic dysfunction). - Aortic valve: Trileaflet; mildly thickened leaflets. - Mitral valve: Mildly thickened leaflets . There was trivial regurgitation. - Right atrium: Central venous pressure (est): 3 mm Hg. - Atrial septum: No defect or patent foramen ovale was identified. - Tricuspid valve: There was mild regurgitation. - Pulmonary arteries: PA peak pressure: 34 mm Hg (S). - Pericardium, extracardiac: There was no pericardial effusion.  Impressions:  - Normal LV wall thickness with LVEF 43-15%, grade 1 diastolic dysfunction. Mildly thickened aortic valve. Trivial mitral and mild regurgitation. PASP 34 mmHg.   Jan 2018 [employer] [place] No aneurysm    Assessment and Plan  1. Orthostatiic hypotension -- florinef stopped due to significant edema and weight gain - doing well on midodrine 5mg  tid, supine HTN resolved after lowering dose without increase in symptoms.  - continue current meds   2. Leg edema - controlled with diuretic just Mon,Wed, Fri  F/u 6 months   Arnoldo Lenis, M.D.

## 2018-07-15 ENCOUNTER — Encounter: Payer: Self-pay | Admitting: *Deleted

## 2018-07-29 ENCOUNTER — Other Ambulatory Visit: Payer: Self-pay | Admitting: Cardiology

## 2018-09-08 DIAGNOSIS — H11001 Unspecified pterygium of right eye: Secondary | ICD-10-CM | POA: Diagnosis not present

## 2018-09-30 DIAGNOSIS — H11001 Unspecified pterygium of right eye: Secondary | ICD-10-CM | POA: Diagnosis not present

## 2018-11-29 ENCOUNTER — Other Ambulatory Visit (HOSPITAL_COMMUNITY): Payer: Self-pay | Admitting: *Deleted

## 2018-11-29 DIAGNOSIS — D508 Other iron deficiency anemias: Secondary | ICD-10-CM

## 2018-11-29 DIAGNOSIS — E538 Deficiency of other specified B group vitamins: Secondary | ICD-10-CM

## 2018-11-30 ENCOUNTER — Other Ambulatory Visit (HOSPITAL_COMMUNITY): Payer: Medicare Other

## 2018-12-07 ENCOUNTER — Ambulatory Visit (HOSPITAL_COMMUNITY): Payer: Medicare Other | Admitting: Nurse Practitioner

## 2019-05-13 ENCOUNTER — Other Ambulatory Visit: Payer: Self-pay | Admitting: Cardiology

## 2019-05-16 ENCOUNTER — Other Ambulatory Visit: Payer: Self-pay | Admitting: Cardiology

## 2019-06-14 ENCOUNTER — Other Ambulatory Visit: Payer: Self-pay | Admitting: Cardiology

## 2019-06-27 ENCOUNTER — Other Ambulatory Visit: Payer: Self-pay | Admitting: *Deleted

## 2019-06-27 DIAGNOSIS — Z20822 Contact with and (suspected) exposure to covid-19: Secondary | ICD-10-CM

## 2019-06-27 DIAGNOSIS — Z20828 Contact with and (suspected) exposure to other viral communicable diseases: Secondary | ICD-10-CM | POA: Diagnosis not present

## 2019-06-29 LAB — NOVEL CORONAVIRUS, NAA: SARS-CoV-2, NAA: NOT DETECTED

## 2019-07-16 ENCOUNTER — Other Ambulatory Visit: Payer: Self-pay | Admitting: Cardiology

## 2019-07-16 NOTE — Progress Notes (Signed)
REVIEWED-NO ADDITIONAL RECOMMENDATIONS. 

## 2019-07-18 ENCOUNTER — Telehealth: Payer: Self-pay | Admitting: Cardiology

## 2019-07-18 NOTE — Telephone Encounter (Signed)
Virtual Visit Pre-Appointment Phone Call  "(Name), I am calling you today to discuss your upcoming appointment. We are currently trying to limit exposure to the virus that causes COVID-19 by seeing patients at home rather than in the office."  1. "What is the BEST phone number to call the day of the visit?" - [phone]   2. Do you have or have access to (through a family member/friend) a smartphone with video capability that we can use for your visit?" a. If yes - list this number in appt notes as cell (if different from BEST phone #) and list the appointment type as a VIDEO visit in appointment notes b. If no - list the appointment type as a PHONE visit in appointment notes  3. Confirm consent - "In the setting of the current Covid19 crisis, you are scheduled for a (phone or video) visit with your provider on (date) at (time).  Just as we do with many in-office visits, in order for you to participate in this visit, we must obtain consent.  If you'd like, I can send this to your mychart (if signed up) or email for you to review.  Otherwise, I can obtain your verbal consent now.  All virtual visits are billed to your insurance company just like a normal visit would be.  By agreeing to a virtual visit, we'd like you to understand that the technology does not allow for your provider to perform an examination, and thus may limit your provider's ability to fully assess your condition. If your provider identifies any concerns that need to be evaluated in person, we will make arrangements to do so.  Finally, though the technology is pretty good, we cannot assure that it will always work on either your or our end, and in the setting of a video visit, we may have to convert it to a phone-only visit.  In either situation, we cannot ensure that we have a secure connection.  Are you willing to proceed?" STAFF: Did the patient verbally acknowledge consent to telehealth visit? Document YES/NO here: YES   4.  5. Advise patient to be prepared - "Two hours prior to your appointment, go ahead and check your blood pressure, pulse, oxygen saturation, and your weight (if you have the equipment to check those) and write them all down. When your visit starts, your provider will ask you for this information. If you have an Apple Watch or Kardia device, please plan to have heart rate information ready on the day of your appointment. Please have a pen and paper handy nearby the day of the visit as well."  6. Give patient instructions for MyChart download to smartphone OR [email or web address] as below if video visit (depending on what platform provider is using)  7. Inform patient they will receive a phone call 15 minutes prior to their appointment time (may be from unknown caller ID) so they should be prepared to answer    [place] has been deemed a candidate for a follow-up tele-health visit to limit community exposure during the Covid-19 pandemic. I spoke with the patient via phone to ensure availability of phone/video source, confirm preferred email & phone number, and discuss instructions and expectations.  I reminded Erik [name] to be prepared with any vital sign and/or heart rhythm information that could potentially be obtained via home monitoring, at the time of his visit. I reminded Erik [name] to expect a phone call prior to his  visit.  Erik [name] 07/18/2019 3:35 PM   INSTRUCTIONS FOR DOWNLOADING THE MYCHART APP TO SMARTPHONE  - The patient must first make sure to have activated MyChart and know their login information - If Apple, go to [employer] and type in MyChart in the search bar and download the app. If Android, ask patient to go to [employer] and type in [place] in the search bar and download the app. The app is free but as with any other app downloads, their phone may require them to verify saved payment information or Apple/Android  password.  - The patient will need to then log into the app with their MyChart username and password, and select [facility] as their healthcare provider to link the account. When it is time for your visit, go to the MyChart app, find appointments, and click Begin Video Visit. Be sure to Select Allow for your device to access the Microphone and Camera for your visit. You will then be connected, and your provider will be with you shortly.  **If they have any issues connecting, or need assistance please contact MyChart service desk [phone])**  **If using a computer, in order to ensure the best quality for their visit they will need to use either of the following Internet Browsers: [employer], or Google Chrome**  IF USING DOXIMITY or DOXY.ME - The patient will receive a link just prior to their visit by text.     FULL LENGTH CONSENT FOR TELE-HEALTH VISIT   I hereby voluntarily request, consent and authorize [facility] and its employed or contracted physicians, physician assistants, nurse practitioners or other licensed health care professionals (the Practitioner), to provide me with telemedicine health care services (the Services") as deemed necessary by the treating Practitioner. I acknowledge and consent to receive the Services by the Practitioner via telemedicine. I understand that the telemedicine visit will involve communicating with the Practitioner through live audiovisual communication technology and the disclosure of certain medical information by electronic transmission. I acknowledge that I have been given the opportunity to request an in-person assessment or other available alternative prior to the telemedicine visit and am voluntarily participating in the telemedicine visit.  I understand that I have the right to withhold or withdraw my consent to the use of telemedicine in the course of my care at any time, without affecting my right to future care or treatment,  and that the Practitioner or I may terminate the telemedicine visit at any time. I understand that I have the right to inspect all information obtained and/or recorded in the course of the telemedicine visit and may receive copies of available information for a reasonable fee.  I understand that some of the potential risks of receiving the Services via telemedicine include:   Delay or interruption in medical evaluation due to technological equipment failure or disruption;  Information transmitted may not be sufficient (e.g. poor resolution of images) to allow for appropriate medical decision making by the Practitioner; and/or   In rare instances, security protocols could fail, causing a breach of personal health information.  Furthermore, I acknowledge that it is my responsibility to provide information about my medical history, conditions and care that is complete and accurate to the best of my ability. I acknowledge that Practitioner's advice, recommendations, and/or decision may be based on factors not within their control, such as incomplete or inaccurate data provided by me or distortions of diagnostic images or specimens that may result from electronic transmissions. I understand  that the practice of medicine is not an exact science and that Practitioner makes no warranties or guarantees regarding treatment outcomes. I acknowledge that I will receive a copy of this consent concurrently upon execution via email to the email address I last provided but may also request a printed copy by calling the office of [facility].    I understand that my insurance will be billed for this visit.   I have read or had this consent read to me.  I understand the contents of this consent, which adequately explains the benefits and risks of the Services being provided via telemedicine.   I have been provided ample opportunity to ask questions regarding this consent and the Services and have had my questions  answered to my satisfaction.  I give my informed consent for the services to be provided through the use of telemedicine in my medical care  By participating in this telemedicine visit I agree to the above.

## 2019-07-19 ENCOUNTER — Telehealth: Payer: Medicare Other | Admitting: Cardiology

## 2019-07-20 ENCOUNTER — Telehealth (INDEPENDENT_AMBULATORY_CARE_PROVIDER_SITE_OTHER): Payer: Medicare Other | Admitting: Cardiology

## 2019-07-20 VITALS — Ht 72.0 in | Wt 200.0 lb

## 2019-07-20 DIAGNOSIS — R600 Localized edema: Secondary | ICD-10-CM

## 2019-07-20 DIAGNOSIS — I951 Orthostatic hypotension: Secondary | ICD-10-CM

## 2019-07-20 NOTE — Patient Instructions (Signed)
Medication Instructions:  Your physician recommends that you continue on your current medications as directed. Please refer to the Current Medication list given to you today.  *If you need a refill on your cardiac medications before your next appointment, please call your pharmacy*  Lab Work: Your physician recommends that you return for lab work in: Today   If you have labs (blood work) drawn today and your tests are completely normal, you will receive your results only by: Marland Kitchen MyChart Message (if you have MyChart) OR . A paper copy in the mail If you have any lab test that is abnormal or we need to change your treatment, we will call you to review the results.  Testing/Procedures: NONE   Follow-Up: At [facility], you and your health needs are our priority.  As part of our continuing mission to provide you with exceptional heart care, we have created designated Provider Care Teams.  These Care Teams include your primary Cardiologist (physician) and Advanced Practice Providers (APPs -  Physician Assistants and Nurse Practitioners) who all work together to provide you with the care you need, when you need it.  Your next appointment:   1 year(s)  The format for your next appointment:   In Person  Provider:   Carlyle Dolly, MD  Other Instructions Thank you for choosing [facility]!

## 2019-07-20 NOTE — Progress Notes (Signed)
Virtual Visit via Telephone Note   This visit type was conducted due to national recommendations for restrictions regarding the COVID-19 Pandemic (e.g. social distancing) in an effort to limit this patient's exposure and mitigate transmission in our community.  Due to his co-morbid illnesses, this patient is at least at moderate risk for complications without adequate follow up.  This format is felt to be most appropriate for this patient at this time.  The patient did not have access to video technology/had technical difficulties with video requiring transitioning to audio format only (telephone).  All issues noted in this document were discussed and addressed.  No physical exam could be performed with this format.  Please refer to the patient's chart for his  consent to telehealth for [facility].   Date:  07/20/2019   ID:  Erik [name], DOB January 13, 1946, MRN [id number]  Patient Location: Home Provider Location: Home  PCP:  [name], MD  Cardiologist:  Dr Carlyle Dolly Electrophysiologist:  None   Evaluation Performed:  Follow-Up Visit  Chief Complaint:  Follow up   History of Present Illness:    Erik [name] is a 73 y.o. male seen today for follow up of the following medical problems.  1. Orthostatic hypotension -florinef stopped due to  issues with weight gain and edema. 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 LE edema - takes lasix prn. Swelling is well controlled  The patient does not have symptoms concerning for COVID-19 infection (fever, chills, cough, or new shortness of breath).    Past Medical History:  Diagnosis Date  . B12 deficiency 06/25/2015  . Chronic back pain   . COPD (chronic obstructive pulmonary disease) ([place])   . DDD (degenerative disc disease), lumbar   . GERD  (gastroesophageal reflux disease)   . Iron deficiency anemia 06/04/2016  . LEMS (Lambert-Eaton myasthenic syndrome) ([place]) 06/25/2015  . Low blood pressure   . Microcytic anemia 06/25/2015  . Seizures ([place])    in remote past   . Shortness of breath dyspnea    Past Surgical History:  Procedure Laterality Date  . COLONOSCOPY N/A 11/12/2014   Dr. Clayburn Pert external and internal hemorrhoids, redundant left colon  . ESOPHAGEAL DILATION N/A 11/12/2014   Procedure: ESOPHAGEAL DILATION;  Surgeon: Danie Binder, MD;  [facility];  Service: Endoscopy;  Laterality: N/A;  . ESOPHAGOGASTRODUODENOSCOPY N/A 11/12/2014   Dr. Fields:mild non-erosive gastritis. negative H.pylori   . None       Current Meds  Medication Sig  . acetaminophen (TYLENOL) 500 MG tablet Take 500 mg by mouth every 6 (six) hours as needed.  . Amifampridine Phosphate (FIRDAPSE) 10 MG TABS Take 15 mg by mouth 3 (three) times daily.  Marland Kitchen aspirin EC 81 MG tablet Take 81 mg by mouth daily.  . furosemide (LASIX) 40 MG tablet TAKE 1 TABLET BY MOUTH ON MONDAY, WEDNESDAY AND FRIDAY **MAY TAKE ADDITIONAL AS NEEDED FOR SWELLING**  . midodrine (PROAMATINE) 5 MG tablet TAKE 1 TABLET BY MOUTH THREE TIMES DAILY WITH MEALS **PATIENT  NEEDS  APPOINTMENT  FOR  FUTHER  REFILLS**  . trolamine salicylate (ASPERCREME) 10 % cream Apply 1 application topically as needed for muscle pain.  . vitamin B-12 (CYANOCOBALAMIN) 1000 MCG tablet Take 1,000 mcg by mouth daily.  . Zinc 50 MG CAPS Take 50 mg  by mouth every morning.     Allergies:   Patient has no known allergies.   Social History   Tobacco Use  . Smoking status: Former Smoker    Packs/day: 0.50    Years: 21.00    Pack years: 10.50    Types: Cigarettes    Start date: 01/22/1963    Quit date: 08/11/1983    Years since quitting: 35.9  . Smokeless tobacco: Never Used  Substance Use Topics  . Alcohol use: No    Alcohol/week: 0.0 standard drinks    Comment: remote past, history of  ETOH abuse.   . Drug use: No     Family Hx: The patient's family history includes Sickle cell anemia in his daughter; Stomach cancer in his maternal grandfather. There is no history of Colon cancer.  ROS:   Please see the history of present illness.     All other systems reviewed and are negative.   Prior CV studies:   The following studies were reviewed today:  06/2014 echo Study Conclusions  - Left ventricle: The cavity size was normal. Wall thickness was normal. Systolic function was normal. The estimated ejection fraction was in the range of 55% to 60%. Wall motion was normal; there were no regional wall motion abnormalities. Doppler parameters are consistent with abnormal left ventricular relaxation (grade 1 diastolic dysfunction). - Aortic valve: Trileaflet; mildly thickened leaflets. - Mitral valve: Mildly thickened leaflets . There was trivial regurgitation. - Right atrium: Central venous pressure (est): 3 mm Hg. - Atrial septum: No defect or patent foramen ovale was identified. - Tricuspid valve: There was mild regurgitation. - Pulmonary arteries: PA peak pressure: 34 mm Hg (S). - Pericardium, extracardiac: There was no pericardial effusion.  Impressions:  - Normal LV wall thickness with LVEF 67-59%, grade 1 diastolic dysfunction. Mildly thickened aortic valve. Trivial mitral and mild regurgitation. PASP 34 mmHg.   Jan 2018 [employer] [place] No aneurysm  Labs/Other Tests and Data Reviewed:    EKG:  No ECG reviewed.  Recent Labs: No results found for requested labs within last 8760 hours.   Recent Lipid Panel No results found for: CHOL, TRIG, HDL, CHOLHDL, LDLCALC, LDLDIRECT  Wt Readings from Last 3 Encounters:  07/20/19 200 lb (90.7 kg)  07/14/18 213 lb 3.2 oz (96.7 kg)  06/06/18 213 lb (96.6 kg)     Objective:    Vital Signs:  Ht 6' (1.829 m)   Wt 200 lb (90.7 kg)   BMI 27.12 kg/m    Normal affect. Normal speech pattern and tone.  Comfortable, no apparent distress. No audible signs of SOB or wheezing.   ASSESSMENT & PLAN:    1. Orthostatiic hypotension -- florinef stopped due to significant edema and weight gain - doing well on midodrine 5mg  tid, supine HTN resolved after lowering dose without increase in symptoms.  - he continues to do well, conitnue current meds   2. Leg edema - controlled with just prn lasix, really improved once florinef was stopped - continue prn lasix  F/u 1 year. Check annual labs  COVID-19 Education: The signs and symptoms of COVID-19 were discussed with the patient and how to seek care for testing (follow up with PCP or arrange E-visit).  The importance of social distancing was discussed today.  Time:   Today, I have spent 15 minutes with the patient with telehealth technology discussing the above problems.     Medication Adjustments/Labs and Tests Ordered: Current medicines are reviewed at length with the patient  today.  Concerns regarding medicines are outlined above.   Tests Ordered: No orders of the defined types were placed in this encounter.   Medication Changes: No orders of the defined types were placed in this encounter.   Follow Up:  In Person in 1 year(s)  Signed, Carlyle Dolly, MD  07/20/2019 10:47 AM    [place]

## 2019-08-03 ENCOUNTER — Other Ambulatory Visit: Payer: Self-pay | Admitting: Cardiology

## 2019-08-07 ENCOUNTER — Telehealth: Payer: Self-pay | Admitting: Cardiology

## 2019-08-07 MED ORDER — FUROSEMIDE 40 MG PO TABS: ORAL_TABLET | ORAL | 3 refills | Status: DC

## 2019-08-07 MED ORDER — MIDODRINE HCL 5 MG PO TABS: ORAL_TABLET | ORAL | 3 refills | Status: DC

## 2019-08-07 NOTE — Telephone Encounter (Signed)
90 day supply with RF 3 given

## 2019-08-07 NOTE — Telephone Encounter (Signed)
Erik [name] called stating that her father's medications were not called in. States that pharmacy had to give her emergency medications until she could contact the office.   Lasix & Proamatine.   Please call daughter [phone] to confirm that medications have been called in.

## 2019-08-09 DIAGNOSIS — R600 Localized edema: Secondary | ICD-10-CM | POA: Diagnosis not present

## 2019-08-09 DIAGNOSIS — I951 Orthostatic hypotension: Secondary | ICD-10-CM | POA: Diagnosis not present

## 2019-08-09 DIAGNOSIS — Z79899 Other long term (current) drug therapy: Secondary | ICD-10-CM | POA: Diagnosis not present

## 2019-08-09 DIAGNOSIS — E559 Vitamin D deficiency, unspecified: Secondary | ICD-10-CM | POA: Diagnosis not present

## 2019-08-10 LAB — COMPREHENSIVE METABOLIC PANEL
AG Ratio: 1.4 (calc) (ref 1.0–2.5)
ALT: 10 U/L (ref 9–46)
AST: 18 U/L (ref 10–35)
Albumin: 4.1 g/dL (ref 3.6–5.1)
Alkaline phosphatase (APISO): 65 U/L (ref 35–144)
BUN/Creatinine Ratio: 14 (calc) (ref 6–22)
BUN: 17 mg/dL (ref 7–25)
CO2: 28 mmol/L (ref 20–32)
Calcium: 9.1 mg/dL (ref 8.6–10.3)
Chloride: 104 mmol/L (ref 98–110)
Creat: 1.22 mg/dL — ABNORMAL HIGH (ref 0.70–1.18)
Globulin: 3 g/dL (calc) (ref 1.9–3.7)
Glucose, Bld: 94 mg/dL (ref 65–99)
Potassium: 4.2 mmol/L (ref 3.5–5.3)
Sodium: 141 mmol/L (ref 135–146)
Total Bilirubin: 0.3 mg/dL (ref 0.2–1.2)
Total Protein: 7.1 g/dL (ref 6.1–8.1)

## 2019-08-10 LAB — LIPID PANEL
Cholesterol: 154 mg/dL (ref <200)
HDL: 36 mg/dL — ABNORMAL LOW (ref >=40)
LDL Cholesterol (Calc): 95 mg/dL (calc)
Non-HDL Cholesterol (Calc): 118 mg/dL (calc) (ref <130)
Total CHOL/HDL Ratio: 4.3 (calc) (ref <5.0)
Triglycerides: 131 mg/dL (ref <150)

## 2019-08-10 LAB — CBC
HCT: 38.1 % — ABNORMAL LOW (ref 38.5–50.0)
Hemoglobin: 11.8 g/dL — ABNORMAL LOW (ref 13.2–17.1)
MCH: 22.3 pg — ABNORMAL LOW (ref 27.0–33.0)
MCHC: 31 g/dL — ABNORMAL LOW (ref 32.0–36.0)
MCV: 71.9 fL — ABNORMAL LOW (ref 80.0–100.0)
MPV: 10.9 fL (ref 7.5–12.5)
Platelets: 254 10*3/uL (ref 140–400)
RBC: 5.3 10*6/uL (ref 4.20–5.80)
RDW: 14.9 % (ref 11.0–15.0)
WBC: 6.7 10*3/uL (ref 3.8–10.8)

## 2019-08-10 LAB — MAGNESIUM: Magnesium: 2.2 mg/dL (ref 1.5–2.5)

## 2019-08-10 LAB — HEMOGLOBIN A1C
Hgb A1c MFr Bld: 6.2 % of total Hgb — ABNORMAL HIGH (ref <5.7)
Mean Plasma Glucose: 131 (calc)
eAG (mmol/L): 7.3 (calc)

## 2019-08-10 LAB — VITAMIN D 25 HYDROXY (VIT D DEFICIENCY, FRACTURES): Vit D, 25-Hydroxy: 18 ng/mL — ABNORMAL LOW (ref 30–100)

## 2019-08-10 LAB — TSH: TSH: 0.83 mIU/L (ref 0.40–4.50)

## 2019-08-14 ENCOUNTER — Telehealth: Payer: Self-pay | Admitting: *Deleted

## 2019-08-14 NOTE — Telephone Encounter (Signed)
Mailbox full

## 2019-08-14 NOTE — Telephone Encounter (Signed)
-----   Message from Arnoldo Lenis, MD sent at 08/10/2019  9:22 AM EST ----- Labs look fine other than low vitamin D. Please forward to pcp, he should start just an over the counter vitamin D supplement  Zandra Abts MD

## 2019-08-25 ENCOUNTER — Encounter: Payer: Self-pay | Admitting: *Deleted

## 2019-08-25 NOTE — Telephone Encounter (Signed)
Left multiple message for pt to return call - will mail letter (no MyChart)

## 2019-09-25 ENCOUNTER — Ambulatory Visit: Payer: Medicare Other | Attending: Internal Medicine

## 2019-11-02 DIAGNOSIS — R779 Abnormality of plasma protein, unspecified: Secondary | ICD-10-CM | POA: Diagnosis not present

## 2019-11-02 DIAGNOSIS — G7080 Lambert-Eaton syndrome, unspecified: Secondary | ICD-10-CM | POA: Diagnosis not present

## 2019-11-02 DIAGNOSIS — R978 Other abnormal tumor markers: Secondary | ICD-10-CM | POA: Diagnosis not present

## 2019-11-02 DIAGNOSIS — G3189 Other specified degenerative diseases of nervous system: Secondary | ICD-10-CM | POA: Diagnosis not present

## 2019-11-08 DIAGNOSIS — H524 Presbyopia: Secondary | ICD-10-CM | POA: Diagnosis not present

## 2019-11-08 DIAGNOSIS — H2513 Age-related nuclear cataract, bilateral: Secondary | ICD-10-CM | POA: Diagnosis not present

## 2019-11-28 DIAGNOSIS — H25812 Combined forms of age-related cataract, left eye: Secondary | ICD-10-CM | POA: Diagnosis not present

## 2019-11-28 DIAGNOSIS — Z01818 Encounter for other preprocedural examination: Secondary | ICD-10-CM | POA: Diagnosis not present

## 2019-11-28 DIAGNOSIS — H25811 Combined forms of age-related cataract, right eye: Secondary | ICD-10-CM | POA: Diagnosis not present

## 2019-12-13 DIAGNOSIS — H25811 Combined forms of age-related cataract, right eye: Secondary | ICD-10-CM | POA: Diagnosis not present

## 2019-12-13 DIAGNOSIS — H2511 Age-related nuclear cataract, right eye: Secondary | ICD-10-CM | POA: Diagnosis not present

## 2019-12-27 DIAGNOSIS — H25812 Combined forms of age-related cataract, left eye: Secondary | ICD-10-CM | POA: Diagnosis not present

## 2019-12-27 DIAGNOSIS — H2512 Age-related nuclear cataract, left eye: Secondary | ICD-10-CM | POA: Diagnosis not present

## 2020-06-06 DIAGNOSIS — J439 Emphysema, unspecified: Secondary | ICD-10-CM | POA: Diagnosis not present

## 2020-06-06 DIAGNOSIS — M6281 Muscle weakness (generalized): Secondary | ICD-10-CM | POA: Diagnosis not present

## 2020-06-06 DIAGNOSIS — C3490 Malignant neoplasm of unspecified part of unspecified bronchus or lung: Secondary | ICD-10-CM | POA: Diagnosis not present

## 2020-06-06 DIAGNOSIS — G7080 Lambert-Eaton syndrome, unspecified: Secondary | ICD-10-CM | POA: Diagnosis not present

## 2020-06-06 DIAGNOSIS — M47816 Spondylosis without myelopathy or radiculopathy, lumbar region: Secondary | ICD-10-CM | POA: Diagnosis not present

## 2020-06-06 DIAGNOSIS — I700 Atherosclerosis of aorta: Secondary | ICD-10-CM | POA: Diagnosis not present

## 2020-06-06 DIAGNOSIS — R911 Solitary pulmonary nodule: Secondary | ICD-10-CM | POA: Diagnosis not present

## 2020-06-06 DIAGNOSIS — I2510 Atherosclerotic heart disease of native coronary artery without angina pectoris: Secondary | ICD-10-CM | POA: Diagnosis not present

## 2020-06-06 DIAGNOSIS — Z87891 Personal history of nicotine dependence: Secondary | ICD-10-CM | POA: Diagnosis not present

## 2020-06-06 DIAGNOSIS — R779 Abnormality of plasma protein, unspecified: Secondary | ICD-10-CM | POA: Diagnosis not present

## 2020-06-06 DIAGNOSIS — R978 Other abnormal tumor markers: Secondary | ICD-10-CM | POA: Diagnosis not present

## 2020-07-21 DIAGNOSIS — Z23 Encounter for immunization: Secondary | ICD-10-CM | POA: Diagnosis not present

## 2020-08-28 ENCOUNTER — Other Ambulatory Visit: Payer: Self-pay | Admitting: Cardiology

## 2020-09-11 ENCOUNTER — Other Ambulatory Visit: Payer: Self-pay | Admitting: Cardiology

## 2020-10-29 ENCOUNTER — Other Ambulatory Visit: Payer: Self-pay | Admitting: Cardiology

## 2020-12-24 ENCOUNTER — Emergency Department (HOSPITAL_COMMUNITY)
Admission: EM | Admit: 2020-12-24 | Discharge: 2020-12-24 | Disposition: A | Discharge status: Home / Self Care | Payer: Medicare Other | Attending: Emergency Medicine | Admitting: Emergency Medicine

## 2020-12-24 ENCOUNTER — Other Ambulatory Visit: Payer: Self-pay

## 2020-12-24 ENCOUNTER — Encounter (HOSPITAL_COMMUNITY): Payer: Self-pay | Admitting: *Deleted

## 2020-12-24 DIAGNOSIS — L600 Ingrowing nail: Secondary | ICD-10-CM | POA: Diagnosis not present

## 2020-12-24 DIAGNOSIS — R2243 Localized swelling, mass and lump, lower limb, bilateral: Secondary | ICD-10-CM | POA: Insufficient documentation

## 2020-12-24 DIAGNOSIS — Z87891 Personal history of nicotine dependence: Secondary | ICD-10-CM | POA: Insufficient documentation

## 2020-12-24 DIAGNOSIS — Z7982 Long term (current) use of aspirin: Secondary | ICD-10-CM | POA: Diagnosis not present

## 2020-12-24 DIAGNOSIS — M79672 Pain in left foot: Secondary | ICD-10-CM | POA: Diagnosis present

## 2020-12-24 DIAGNOSIS — J449 Chronic obstructive pulmonary disease, unspecified: Secondary | ICD-10-CM | POA: Diagnosis not present

## 2020-12-24 MED ORDER — CEPHALEXIN 500 MG PO CAPS
500.0000 mg | ORAL_CAPSULE | Freq: Once | ORAL | Status: AC
  Administered 2020-12-24: 500 mg via ORAL
  Filled 2020-12-24: qty 1

## 2020-12-24 MED ORDER — CEPHALEXIN 500 MG PO CAPS: 500.0000 mg | ORAL_CAPSULE | Freq: Four times a day (QID) | ORAL | 0 refills | Status: AC

## 2020-12-24 NOTE — Discharge Instructions (Addendum)
You are seen in the ER today for your sore toe.  You are found on ingrown toenail with some signs of infection starting in the skin around your toenail.  You administered your first dose of antibiotics in the emergency department and prescribed antibiotics to take at home for the next few days.  Please take the antibiotics as prescribed for the entire course and follow-up with your primary care doctor next week.  Additionally below is listed for the contact information for the podiatrist, the foot specialist, with whom you should follow-up .  Please call their office to schedule follow-up appointment.  Return to the emergency department develop any fevers, chills, nausea or vomiting that does not stop, or any other new or severe symptoms.

## 2020-12-24 NOTE — ED Provider Notes (Signed)
[facility] EMERGENCY DEPARTMENT Provider Note   CSN: [id number] Arrival date & time: 12/24/20  1132     History Chief Complaint  Patient presents with  . Foot Pain    Erik [name] is a 75 y.o. male who presents with concern for foot pain bilaterally.  Patient with history of chronic lower extremity swelling on Lasix who states that his feet are most swollen in the evenings after he has been up on them throughout the day.  He states the left is worse than right and he is some associated pain, however it is improved with Lasix and with elevation.  Patient is a very poor historian, it is difficult to ascertain his presenting complaint today, however after much prompting it appears that he is experiencing some pain in his left great toe after attempting to repair ingrown toenail at home.  He denies fevers or chills, nausea or vomiting, difficulty walking, numbness, tingling, weakness in his feet.  I personally reviewed this patient's medical records.  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    HPI     Past Medical History:  Diagnosis Date  . B12 deficiency 06/25/2015  . Chronic back pain   . COPD (chronic obstructive pulmonary disease) ([place])   . DDD (degenerative disc disease), lumbar   . GERD (gastroesophageal reflux disease)   . Iron deficiency anemia 06/04/2016  . 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 Low blood pressure   . Microcytic anemia 06/25/2015  . Seizures ([place])    in remote past   . Shortness of breath dyspnea     Patient Active Problem List   Diagnosis Date Noted  . Iron deficiency anemia 06/04/2016  . Microcytic anemia 06/25/2015  . 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 B12 deficiency 06/25/2015  . Loss of weight 10/26/2014  . Dysphagia, pharyngoesophageal phase 10/26/2014  . Low back pain 07/10/2014  .  Orthostatic hypotension 07/07/2014  . Orthostasis 07/07/2014    Past Surgical History:  Procedure Laterality Date  . COLONOSCOPY N/A 11/12/2014   Dr. Clayburn Pert external and internal hemorrhoids, redundant left colon  . ESOPHAGEAL DILATION N/A 11/12/2014   Procedure: ESOPHAGEAL DILATION;  Surgeon: Danie Binder, MD;  Location[facility];  Service: Endoscopy;  Laterality: N/A;  . ESOPHAGOGASTRODUODENOSCOPY N/A 11/12/2014   Dr. Fields:mild non-erosive gastritis. negative H.pylori   . None         Family History  Problem Relation Age of Onset  . Stomach cancer Maternal Grandfather   . Sickle cell anemia Daughter   . Colon cancer Neg Hx     Social History   Tobacco Use  . Smoking status: Former Smoker    Packs/day: 0.50    Years: 21.00    Pack years: 10.50    Types: Cigarettes    Start date: 01/22/1963    Quit date: 08/11/1983    Years since quitting: 37.4  . Smokeless tobacco: Never Used  Vaping Use  . Vaping Use: Never used  Substance Use Topics  . Alcohol use: No    Alcohol/week: 0.0 standard drinks    Comment: remote past, history of ETOH abuse.   . Drug use: No    Home Medications Prior to Admission medications   Medication Sig Start Date End Date Taking? Authorizing Provider  acetaminophen (TYLENOL) 500 MG tablet Take 500 mg by mouth every 6 (six) hours as needed.   Yes [provider]  Amifampridine Phosphate 10 MG TABS Take 15 mg by mouth 3 (three) times daily.   Yes [provider]  aspirin EC 81 MG tablet Take 81 mg by mouth daily.   Yes [provider]  cephALEXin (KEFLEX) 500 MG capsule Take 1 capsule (500 mg total) by mouth 4 (four) times daily for 5 days. 12/24/20 12/29/20 Yes Bravery Ketcham, Eugene Garnet R, PA-C  furosemide (LASIX) 40 MG tablet TAKE 1 TABLET BY MOUTH ON MONDAY, WEDNESDAY AND FRIDAY ( MAY TAKE ADDITIONAL AS NEEDED FOR SWELLING) 10/29/20  Yes Branch, Alphonse Guild, MD  midodrine (PROAMATINE) 5 MG tablet TAKE 1 TABLET BY MOUTH  THREE TIMES DAILY WITH MEALS 09/12/20  Yes Branch, Alphonse Guild, MD  RUZURGI 10 MG TABS Take 10 mg by mouth 3 (three) times daily. 08/06/20  Yes [provider]  trolamine salicylate (ASPERCREME) 10 % cream Apply 1 application topically as needed for muscle pain.   Yes [provider]  vitamin B-12 (CYANOCOBALAMIN) 1000 MCG tablet Take 1,000 mcg by mouth daily.   Yes [provider]  Zinc 50 MG CAPS Take 50 mg by mouth every morning.   Yes [provider]    Allergies    Patient has no known allergies.  Review of Systems   Review of Systems  Constitutional: Negative.   HENT: Negative.   Respiratory: Negative.   Cardiovascular: Negative.   Gastrointestinal: Negative.   Musculoskeletal:       Bilateral foot pain  Skin: Negative.   Neurological: Negative.     Physical Exam Updated Vital Signs BP (!) 144/72   Pulse 77   Temp 97.9 F (36.6 C) (Oral)   Resp 18   SpO2 97%   Physical Exam Vitals and nursing note reviewed.  HENT:     Head: Normocephalic and atraumatic.  Eyes:     General: No scleral icterus.       Right eye: No discharge.        Left eye: No discharge.     Conjunctiva/sclera: Conjunctivae normal.  Cardiovascular:     Rate and Rhythm: Normal rate and regular rhythm.     Pulses:          Dorsalis pedis pulses are detected w/ Doppler on the right side and detected w/ Doppler on the left side.     Heart sounds: Normal heart sounds. No murmur heard.   Pulmonary:     Effort: Pulmonary effort is normal.  Musculoskeletal:     Right lower leg: 1+ Edema present.     Left lower leg: 1+ Edema present.       Feet:  Feet:     Right foot:     Skin integrity: Callus and dry skin present. No ulcer.     Toenail Condition: Right toenails are abnormally thick and long. Fungal disease present.    Left foot:     Skin integrity: Erythema, warmth, callus and dry skin present. No ulcer.     Toenail Condition: Left toenails are abnormally  thick, long and ingrown. Fungal disease present. Skin:    General: Skin is warm and dry.     Comments: Chronic skin changes likely related to lower extremity edema; thickened, dry, flaking skin from the mid shins down into the feet bilaterally  Neurological:     General: No focal deficit present.     Mental Status: He is alert.     Sensory: Sensation is intact.     Motor: Motor function is intact.  Gait: Gait is intact.  Psychiatric:        Mood and Affect: Mood normal.     ED Results / Procedures / Treatments   Labs (all labs ordered are listed, but only abnormal results are displayed) Labs Reviewed - No data to display  EKG None  Radiology No results found.  Procedures Procedures   Medications Ordered in ED Medications  cephALEXin (KEFLEX) capsule 500 mg (500 mg Oral Given 12/24/20 1543)    ED Course  I have reviewed the triage vital signs and the nursing notes.  Pertinent labs & imaging results that were available during my care of the patient were reviewed by me and considered in my medical decision making (see chart for details).    MDM Rules/Calculators/A&P                         75 year old male presents with concern for left greater than right foot pain, pain that is new in the left great toe x1 week.  Mildly hypertensive on intake, vital signs otherwise normal.  Cardiopulmonary exam is normal, abdominal exam is benign.  Chronic skin changes secondary to chronic lower extremity edema in the bilateral lower extremities, dopplerable DP pulses bilaterally.  There is erythema and tenderness palpation of the left great toe concerning for possible early cellulitis.  Additionally there is significant fungal infection that renders ingrown toe nail removal extremely challenging.  Repair is not attempted in the emergency department.  First dose of antibiotics administered and will provide podiatry follow-up.  No further work-up warranted in the ED as time given  reassuring physical exam and vital signs.  Recommend PCP follow-up and podiatry follow-up as discussed.  [place] voiced understanding with medical evaluation and treatment plan.  Each of his questions was answered to his expressed satisfaction.  Return precautions were given.  Patient is well-appearing, stable, and appropriate for discharge at this time.  This chart was dictated using voice recognition software, Dragon. Despite the best efforts of this provider to proofread and correct errors, errors may still occur which can change documentation meaning.  Final Clinical Impression(s) / ED Diagnoses Final diagnoses:  Ingrown toenail of left foot    Rx / DC Orders ED Discharge Orders         Ordered    cephALEXin (KEFLEX) 500 MG capsule  4 times daily        12/24/20 1601           Jomes Giraldo, Gypsy Balsam, PA-C 12/25/20 0028    Hayden Rasmussen, MD 12/25/20 [phone]

## 2020-12-24 NOTE — ED Triage Notes (Signed)
States he has been trying to work on his feet for over 2 months. States he has problems with both feet and the left is worse than the right

## 2020-12-25 ENCOUNTER — Other Ambulatory Visit: Payer: Self-pay | Admitting: Cardiology

## 2020-12-31 ENCOUNTER — Other Ambulatory Visit: Payer: Self-pay | Admitting: Cardiology

## 2021-01-07 ENCOUNTER — Encounter (HOSPITAL_COMMUNITY): Payer: Self-pay | Admitting: Oncology

## 2021-01-13 ENCOUNTER — Encounter: Payer: Self-pay | Admitting: Podiatry

## 2021-01-13 ENCOUNTER — Ambulatory Visit (INDEPENDENT_AMBULATORY_CARE_PROVIDER_SITE_OTHER): Payer: Medicare Other | Admitting: Podiatry

## 2021-01-13 ENCOUNTER — Other Ambulatory Visit: Payer: Self-pay

## 2021-01-13 DIAGNOSIS — B351 Tinea unguium: Secondary | ICD-10-CM

## 2021-01-13 DIAGNOSIS — M79675 Pain in left toe(s): Secondary | ICD-10-CM

## 2021-01-13 DIAGNOSIS — L309 Dermatitis, unspecified: Secondary | ICD-10-CM

## 2021-01-13 DIAGNOSIS — B353 Tinea pedis: Secondary | ICD-10-CM

## 2021-01-13 DIAGNOSIS — M79674 Pain in right toe(s): Secondary | ICD-10-CM

## 2021-01-13 MED ORDER — KETOCONAZOLE 2 % EX CREA: 1.0000 "application " | TOPICAL_CREAM | Freq: Every day | CUTANEOUS | 2 refills | Status: DC

## 2021-01-13 NOTE — Progress Notes (Signed)
  Subjective:  Patient ID: Erik [name], male    DOB: 09-Dec-1945,  MRN: [id number]  Chief Complaint  Patient presents with  . Nail Problem    Thick painful toenails    75 y.o. male presents with the above complaint. History confirmed with patient.   Objective:  Physical Exam: warm, good capillary refill, no trophic changes or ulcerative lesions, normal DP and PT pulses and normal sensory exam.  He has onychomycosis x10 with thickened elongated brown-yellow nail plates with subungual debris.  He also has significant tinea pedis with dry scaling rash and interdigital maceration scaling along legs as well Assessment:   1. Pain due to onychomycosis of toenails of both feet   2. Tinea pedis of both feet   3. Dermatitis      Plan:  Patient was evaluated and treated and all questions answered.  Discussed the etiology and treatment options for the condition in detail with the patient. Educated patient on the topical and oral treatment options for mycotic nails. Recommended debridement of the nails today. Sharp and mechanical debridement performed of all painful and mycotic nails today. Nails debrided in length and thickness using a nail nipper to level of comfort. Discussed treatment options including appropriate shoe gear. Follow up as needed for painful nails.  Discussed the etiology and treatment options for tinea pedis.  Discussed topical and oral treatment.  Recommended topical treatment with 2% ketoconazole cream.  This was sent to the patient's pharmacy.  Also discussed appropriate foot hygiene, use of antifungal spray such as Tinactin in shoes, as well as cleaning her foot surfaces such as showers and bathroom floors with bleach.   Return for painful nails.

## 2021-01-15 NOTE — Progress Notes (Signed)
Cardiology Office Note  Date: 01/16/2021   ID: ULISSES [name], DOB [birth year], MRN [id number]  PCP:  [name], MD  Cardiologist:  Carlyle Dolly, MD Electrophysiologist:  None   Chief Complaint: 1 year follow up.  History of Present Illness: [name] is a 75 y.o. male with a history of COPD, Erik, SOB, GERD, Seizures.  Last seen by Dr Harl Bowie via telemedicine visit on 07/20/2019 for orthostatic hypotension. Florinef was stopped due to weight gain and edema. No recent dizziness. Was doing doing well on midodrine 5 mg po tid without dizziness. LEMS (Lambert-Easton-myasthenic syndrome). He was continuing Lasix prn and edema controlled since stopping Florinef.  He is here today for 1 year follow-up.  He denies any recent acute illnesses or hospitalizations in the interim since last visit.  He denies any dizziness, lightheadedness, presyncopal or syncopal episodes.  Denies any palpitations or arrhythmias, SOB or DOE, CVA or TIA-like symptoms, PND, orthopnea, claudication, bleeding, DVT or PE-like symptoms.  He does have chronic lower extremity edema and takes as needed Lasix.  EKG today normal sinus rhythm with sinus arrhythmia rate of 73, left anterior fascicular block.  Past Medical History:  Diagnosis Date   B12 deficiency 06/25/2015   Chronic back pain    COPD (chronic obstructive pulmonary disease) (HCC)    DDD (degenerative disc disease), lumbar    GERD (gastroesophageal reflux disease)    Iron deficiency anemia 06/04/2016   LEMS (Lambert-Eaton myasthenic syndrome) ([place]) 06/25/2015   Low blood pressure    Microcytic anemia 06/25/2015   Seizures (HCC)    in remote past    Shortness of breath dyspnea     Past Surgical History:  Procedure Laterality Date   COLONOSCOPY N/A 11/12/2014   Dr. Clayburn Pert external and internal hemorrhoids, redundant left colon   ESOPHAGEAL DILATION N/A 11/12/2014   Procedure: ESOPHAGEAL DILATION;  Surgeon: Danie Binder, MD;   Location: [facility];  Service: Endoscopy;  Laterality: N/A;   ESOPHAGOGASTRODUODENOSCOPY N/A 11/12/2014   Dr. Fields:mild non-erosive gastritis. negative H.pylori    None      Current Outpatient Medications  Medication Sig Dispense Refill   acetaminophen (TYLENOL) 500 MG tablet Take 500 mg by mouth every 6 (six) hours as needed.     Amifampridine Phosphate 10 MG TABS Take 15 mg by mouth 3 (three) times daily.     aspirin EC 81 MG tablet Take 81 mg by mouth daily.     furosemide (LASIX) 40 MG tablet TAKE 1 TABLET BY MOUTH ON MONDAY, WEDNESDAY AND FRIDAY - MAY TAKE ADDITIONAL AS NEEDED FOR SWELLING. PT NEEDS APPT FOR FURTHER REFILLS 30 tablet 6   ketoconazole (NIZORAL) 2 % cream Apply 1 application topically daily. 60 g 2   midodrine (PROAMATINE) 5 MG tablet TAKE 1 TABLET BY MOUTH THREE TIMES DAILY WITH MEALS 270 tablet 3   RUZURGI 10 MG TABS Take 10 mg by mouth 3 (three) times daily.     trolamine salicylate (ASPERCREME) 10 % cream Apply 1 application topically as needed for muscle pain.     vitamin B-12 (CYANOCOBALAMIN) 1000 MCG tablet Take 1,000 mcg by mouth daily.     Zinc 50 MG CAPS Take 50 mg by mouth every morning.     No current facility-administered medications for this visit.   Allergies:  Patient has no known allergies.   Social History: The patient  reports that he quit smoking about 37 years ago. His smoking use included cigarettes. He started smoking  about 58 years ago. He has a 10.50 pack-year smoking history. He has never used smokeless tobacco. He reports that he does not drink alcohol and does not use drugs.   Family History: The patient's family history includes Sickle cell anemia in his daughter; Stomach cancer in his maternal grandfather.   ROS:  Please see the history of present illness. Otherwise, complete review of systems is positive for none.  All other systems are reviewed and negative.   Physical Exam: VS:  BP 140/70   Pulse 74   Ht 6' (1.829 m)   Wt 207  lb 12.8 oz (94.3 kg)   SpO2 97%   BMI 28.18 kg/m , BMI Body mass index is 28.18 kg/m.  Wt Readings from Last 3 Encounters:  01/16/21 207 lb 12.8 oz (94.3 kg)  07/20/19 200 lb (90.7 kg)  07/14/18 213 lb 3.2 oz (96.7 kg)    General: Patient appears comfortable at rest. Neck: Supple, no elevated JVP or carotid bruits, no thyromegaly. Lungs: Clear to auscultation, nonlabored breathing at rest. Cardiac: Regular rate and rhythm, no S3 or significant systolic murmur, no pericardial rub. Extremities: Chronic lower extremity mild pitting edema, distal pulses 2+. Skin: Warm and dry. Musculoskeletal: No kyphosis. Neuropsychiatric: Alert and oriented x3, affect grossly appropriate.  ECG:  An ECG dated 01/16/2021 was personally reviewed today and demonstrated:    Normal sinus rhythm with sinus arrhythmia rate of 73, LAFB.  Recent Labwork: No results found for requested labs within last 8760 hours.     Component Value Date/Time   CHOL 154 08/09/2019 0809   TRIG 131 08/09/2019 0809   HDL 36 (L) 08/09/2019 0809   CHOLHDL 4.3 08/09/2019 0809   Watts 95 08/09/2019 0809    Other Studies Reviewed Today:   06/2014 echo Study Conclusions  - Left ventricle: The cavity size was normal. Wall thickness was   normal. Systolic function was normal. The estimated ejection   fraction was in the range of 55% to 60%. Wall motion was normal;   there were no regional wall motion abnormalities. Doppler   parameters are consistent with abnormal left ventricular   relaxation (grade 1 diastolic dysfunction). - Aortic valve: Trileaflet; mildly thickened leaflets. - Mitral valve: Mildly thickened leaflets . There was trivial   regurgitation. - Right atrium: Central venous pressure (est): 3 mm Hg. - Atrial septum: No defect or patent foramen ovale was identified. - Tricuspid valve: There was mild regurgitation. - Pulmonary arteries: PA peak pressure: 34 mm Hg (S). - Pericardium, extracardiac: There was no  pericardial effusion.  Impressions:  - Normal LV wall thickness with LVEF 53-97%, grade 1 diastolic   dysfunction. Mildly thickened aortic valve. Trivial mitral and   mild regurgitation. PASP 34 mmHg.     Jan 2018 AAA [place] No aneurysm  Assessment and Plan:  1. Orthostatic hypotension   2. Leg edema    1. Orthostatic hypotension Denies any recent issues with orthostatic hypotension.  He continues midodrine 5 mg p.o. 3 times daily.  2. Leg edema Continues with chronic lower extremity edema and continues on Lasix 40 mg on Mondays Wednesdays and Fridays and takes additional for swelling  Medication Adjustments/Labs and Tests Ordered: Current medicines are reviewed at length with the patient today.  Concerns regarding medicines are outlined above.   Disposition: Follow-up with Dr. Harl Bowie or APP 1 year  Signed, Levell July, NP 01/16/2021 4:45 PM     [facility] [place], [place], [place]  [place] Phone: [phone]; Fax: [phone]

## 2021-01-16 ENCOUNTER — Other Ambulatory Visit: Payer: Self-pay | Admitting: Family Medicine

## 2021-01-16 ENCOUNTER — Ambulatory Visit (INDEPENDENT_AMBULATORY_CARE_PROVIDER_SITE_OTHER): Payer: Medicare Other | Admitting: Family Medicine

## 2021-01-16 ENCOUNTER — Encounter: Payer: Self-pay | Admitting: Family Medicine

## 2021-01-16 VITALS — BP 140/70 | HR 74 | Ht 72.0 in | Wt 207.8 lb

## 2021-01-16 DIAGNOSIS — I951 Orthostatic hypotension: Secondary | ICD-10-CM

## 2021-01-16 DIAGNOSIS — R600 Localized edema: Secondary | ICD-10-CM | POA: Diagnosis not present

## 2021-01-16 NOTE — Patient Instructions (Addendum)

## 2021-04-17 ENCOUNTER — Ambulatory Visit (INDEPENDENT_AMBULATORY_CARE_PROVIDER_SITE_OTHER): Payer: Medicare Other | Admitting: Podiatry

## 2021-04-17 ENCOUNTER — Other Ambulatory Visit: Payer: Self-pay

## 2021-04-17 DIAGNOSIS — M79675 Pain in left toe(s): Secondary | ICD-10-CM

## 2021-04-17 DIAGNOSIS — B351 Tinea unguium: Secondary | ICD-10-CM | POA: Diagnosis not present

## 2021-04-17 DIAGNOSIS — M79674 Pain in right toe(s): Secondary | ICD-10-CM

## 2021-04-17 DIAGNOSIS — B353 Tinea pedis: Secondary | ICD-10-CM

## 2021-04-18 NOTE — Progress Notes (Signed)
  Subjective:  Patient ID: Erik [name], male    DOB: 1946-07-13,  MRN: [id number]  Chief Complaint  Patient presents with   Nail Problem    3 month follow up routine foot care     75 y.o. male presents with the above complaint. History confirmed with patient.   Objective:  Physical Exam: warm, good capillary refill, no trophic changes or ulcerative lesions, normal DP and PT pulses and normal sensory exam.  He has onychomycosis x10 with thickened elongated brown-yellow nail plates with subungual debris.  He also has significant tinea pedis with dry scaling rash and interdigital maceration scaling along legs as well Assessment:   1. Pain due to onychomycosis of toenails of both feet   2. Tinea pedis of both feet      Plan:  Patient was evaluated and treated and all questions answered.  Discussed the etiology and treatment options for the condition in detail with the patient. Educated patient on the topical and oral treatment options for mycotic nails. Recommended debridement of the nails today. Sharp and mechanical debridement performed of all painful and mycotic nails today. Nails debrided in length and thickness using a nail nipper to level of comfort. Discussed treatment options including appropriate shoe gear. Follow up as needed for painful nails.  Tinea he has is slowly improving he should continue the ketoconazole ointment I discussed with him that if this does not continue to improve we could consider a short course of an oral agent such as fluconazole or terbinafine   Return in about 3 months (around 07/17/2021) for painful thick nails and athlete's foot .

## 2021-05-20 ENCOUNTER — Other Ambulatory Visit: Payer: Self-pay

## 2021-05-20 ENCOUNTER — Ambulatory Visit (INDEPENDENT_AMBULATORY_CARE_PROVIDER_SITE_OTHER): Payer: Medicare Other | Admitting: Podiatry

## 2021-05-20 DIAGNOSIS — L97319 Non-pressure chronic ulcer of right ankle with unspecified severity: Secondary | ICD-10-CM

## 2021-05-20 DIAGNOSIS — I890 Lymphedema, not elsewhere classified: Secondary | ICD-10-CM

## 2021-05-20 DIAGNOSIS — I83013 Varicose veins of right lower extremity with ulcer of ankle: Secondary | ICD-10-CM | POA: Diagnosis not present

## 2021-05-20 DIAGNOSIS — L03115 Cellulitis of right lower limb: Secondary | ICD-10-CM | POA: Diagnosis not present

## 2021-05-20 MED ORDER — MUPIROCIN 2 % EX OINT: 1.0000 "application " | TOPICAL_OINTMENT | Freq: Two times a day (BID) | CUTANEOUS | 2 refills | Status: DC

## 2021-05-20 MED ORDER — CEPHALEXIN 500 MG PO CAPS: 500.0000 mg | ORAL_CAPSULE | Freq: Three times a day (TID) | ORAL | 0 refills | Status: AC

## 2021-05-20 NOTE — Patient Instructions (Signed)
Change the dressing daily with the ointment, gauze, and an ACE wrap    [facility] [address] [place], [place],  [place]  [id number] Main: [phone]

## 2021-05-21 NOTE — Progress Notes (Signed)
  Subjective:  Patient ID: Erik [name], male    DOB: 06/30/1946,  MRN: [id number]  Chief Complaint  Patient presents with   Pain    Pt states he has right ankle pain.    75 y.o. male presents with the above complaint. History confirmed with patient.  Noticed he started having pain swelling and blisters around the leg on the right side is a recurrent issue  Objective:  Physical Exam: warm, good capillary refill and normal sensory exam.  Swollen leg with venous insufficiency and lymphedema on the right side with weeping lesions.  Warm to touch.  No posterior calf pain or knee pain or Homans' sign.  Erythematous. Assessment:   1. Venous stasis ulcer of ankle, right (HCC)   2. Lymphedema   3. Cellulitis of right lower extremity      Plan:  Patient was evaluated and treated and all questions answered.  Suspect he has cellulitis secondary to lymphedema and stasis ulcerations.  This recurrent issue for him.  I am referring him to the wound care center for further care.  I placed him on Keflex and mupirocin and he will change daily until he is seen at the wound care center.  No follow-ups on file.

## 2021-05-28 ENCOUNTER — Other Ambulatory Visit: Payer: Self-pay

## 2021-05-28 ENCOUNTER — Encounter: Payer: Medicare Other | Attending: Internal Medicine | Admitting: Internal Medicine

## 2021-05-28 DIAGNOSIS — J449 Chronic obstructive pulmonary disease, unspecified: Secondary | ICD-10-CM | POA: Diagnosis not present

## 2021-05-28 DIAGNOSIS — Z87891 Personal history of nicotine dependence: Secondary | ICD-10-CM | POA: Insufficient documentation

## 2021-05-28 DIAGNOSIS — G7080 Lambert-Eaton syndrome, unspecified: Secondary | ICD-10-CM | POA: Insufficient documentation

## 2021-05-28 DIAGNOSIS — I87311 Chronic venous hypertension (idiopathic) with ulcer of right lower extremity: Secondary | ICD-10-CM | POA: Diagnosis not present

## 2021-05-28 DIAGNOSIS — I872 Venous insufficiency (chronic) (peripheral): Secondary | ICD-10-CM | POA: Insufficient documentation

## 2021-05-28 DIAGNOSIS — I890 Lymphedema, not elsewhere classified: Secondary | ICD-10-CM | POA: Diagnosis not present

## 2021-06-04 ENCOUNTER — Other Ambulatory Visit: Payer: Self-pay

## 2021-06-04 ENCOUNTER — Encounter (HOSPITAL_BASED_OUTPATIENT_CLINIC_OR_DEPARTMENT_OTHER): Payer: Medicare Other | Admitting: Internal Medicine

## 2021-06-04 DIAGNOSIS — J449 Chronic obstructive pulmonary disease, unspecified: Secondary | ICD-10-CM

## 2021-06-04 DIAGNOSIS — I87311 Chronic venous hypertension (idiopathic) with ulcer of right lower extremity: Secondary | ICD-10-CM | POA: Diagnosis not present

## 2021-06-04 DIAGNOSIS — I890 Lymphedema, not elsewhere classified: Secondary | ICD-10-CM | POA: Diagnosis not present

## 2021-06-05 DIAGNOSIS — I2510 Atherosclerotic heart disease of native coronary artery without angina pectoris: Secondary | ICD-10-CM | POA: Diagnosis not present

## 2021-06-05 DIAGNOSIS — G7080 Lambert-Eaton syndrome, unspecified: Secondary | ICD-10-CM | POA: Diagnosis not present

## 2021-06-05 DIAGNOSIS — J439 Emphysema, unspecified: Secondary | ICD-10-CM | POA: Diagnosis not present

## 2021-06-05 DIAGNOSIS — M47816 Spondylosis without myelopathy or radiculopathy, lumbar region: Secondary | ICD-10-CM | POA: Diagnosis not present

## 2021-06-05 DIAGNOSIS — I700 Atherosclerosis of aorta: Secondary | ICD-10-CM | POA: Diagnosis not present

## 2021-06-05 DIAGNOSIS — R911 Solitary pulmonary nodule: Secondary | ICD-10-CM | POA: Diagnosis not present

## 2021-06-05 DIAGNOSIS — M6281 Muscle weakness (generalized): Secondary | ICD-10-CM | POA: Diagnosis not present

## 2021-06-05 DIAGNOSIS — Z87891 Personal history of nicotine dependence: Secondary | ICD-10-CM | POA: Diagnosis not present

## 2021-06-10 NOTE — Progress Notes (Signed)
Erik [name] ([id number]) Visit Report for 06/04/2021 Chief Complaint Document Details Patient Name: [name], [name] Date of Service: 06/04/2021 12:30 PM Medical Record Number: [id number] Patient Account Number: [id number] Date of Birth/Sex: [birth year] (75 y.o. M) Treating RN: Carlene Coria Primary Care Provider: [name] Other Clinician: Referring Provider: [name] Treating Provider/Extender: Yaakov Guthrie in Treatment: 1 Information Obtained from: Patient Chief Complaint Right lower extremity wound Electronic Signature(s) Signed: 06/04/2021 1:56:35 PM By: Kalman Shan DO Entered By: Kalman Shan on 06/04/2021 13:48:26 [name], [name] ([id number]) -------------------------------------------------------------------------------- HPI Details Patient Name: Thomley, Kimo L. Date of Service: 06/04/2021 12:30 PM Medical Record Number: [id number] Patient Account Number: [id number] Date of Birth/Sex: [birth year] (75 y.o. M) Treating RN: Carlene Coria Primary Care Provider: [name] Other Clinician: Referring Provider: [name] Treating Provider/Extender: Yaakov Guthrie in Treatment: 1 History of Present Illness HPI Description: Admission 05/28/2021 Mr. [name] Sookdeo is a 75 year old male with a past medical history of lymphedema, chronic venous insufficiency, COPD and LambertoEaton myasthenic syndrome that presents to the clinic for a 1 year history of skin changes with increased bilateral lower extremity edema right greater than left now with developing wounds to his right lower extremity. He was evaluated by podiatry on 10/11 who referred him to our clinic for this issue. At that time he was started on mupirocin and Keflex for probable cellulitis secondary to lymphedema. He currently denies signs of infection. He does not have compression stockings He is accompanied by his daughter who helps provide  the history as well. 10/26; patient presents for follow-up. He has been using silver alginate with dressing changes. The area is healed. He is not sure if he is received his compression stockings. His daughter who helps provide the history is not present today. He is not scheduled his ABIs with TBI's or venous reflux studies. He has no issues or complaints today. He denies signs of infection. Electronic Signature(s) Signed: 06/04/2021 1:56:35 PM By: Kalman Shan DO Entered By: Kalman Shan on 06/04/2021 13:49:20 Erik [name], [name] ([id number]) -------------------------------------------------------------------------------- Physical Exam Details Patient Name: Erik [name], [name] Date of Service: 06/04/2021 12:30 PM Medical Record Number: [id number] Patient Account Number: [id number] Date of Birth/Sex: [birth year] (75 y.o. M) Treating RN: Carlene Coria Primary Care Provider: [name] Other Clinician: Referring Provider: [name] Treating Provider/Extender: Yaakov Guthrie in Treatment: 1 Constitutional . Psychiatric . Notes Chronic lymphedema skin changes bilaterally with nonpitting edema to the knees. No active open wounds present. No drainage noted. Difficult to palpate pedal pulses. No obvious signs of infection. Electronic Signature(s) Signed: 06/04/2021 1:56:35 PM By: Kalman Shan DO Entered By: Kalman Shan on 06/04/2021 13:51:36 [name], [name] ([id number]) -------------------------------------------------------------------------------- Physician Orders Details Patient Name: [name], [name] Date of Service: 06/04/2021 12:30 PM Medical Record Number: [id number] Patient Account Number: [id number] Date of Birth/Sex: 1946-06-04 (75 y.o. M) Treating RN: Carlene Coria Primary Care Provider: [name] Other Clinician: Referring Provider: [name] Treating Provider/Extender: Yaakov Guthrie in Treatment:  1 Verbal / Phone Orders: No Diagnosis Coding Follow-up Appointments o Return Appointment in 1 week. Bathing/ Shower/ Hygiene o May shower; gently cleanse wound with antibacterial soap, rinse and pat dry prior to dressing wounds Edema Control - Lymphedema / Segmental Compressive Device / Other o Elevate, Exercise Daily and Avoid Standing for Long Periods of Time. o Elevate legs to the level of the heart and pump ankles as often as possible o Elevate leg(s) parallel to the floor when sitting. Off-Loading o Turn and reposition every 2  hours Wound Treatment Wound #1 - Lower Leg Wound Laterality: Right, Distal Cleanser: Byram Ancillary Kit - 15 Day Supply (Generic) 1 x Per Day/30 Days Discharge Instructions: Use supplies as instructed; Kit contains: (15) Saline Bullets; (15) 3x3 Gauze; 15 pr Gloves Cleanser: Soap and Water 1 x Per Day/30 Days Discharge Instructions: Gently cleanse wound with antibacterial soap, rinse and pat dry prior to dressing wounds Primary Dressing: Silvercel 4 1/4x 4 1/4 (in/in) (Generic) 1 x Per Day/30 Days Discharge Instructions: Apply Silvercel 4 1/4x 4 1/4 (in/in) as instructed Secondary Dressing: Zetuvit Plus Silicone Non-bordered 5x5 (in/in) (Generic) 1 x Per Day/30 Days Compression Stockings: Circaid Juxta Lite Compression Wrap Right Leg Compression Amount: 20-30 mmHG Discharge Instructions: Apply Circaid Juxta Lite Compression Wrap as directed Electronic Signature(s) Signed: 06/04/2021 1:56:35 PM By: Kalman Shan DO Signed: 06/09/2021 6:56:11 PM By: Carlene Coria RN Entered By: Carlene Coria on 06/04/2021 13:31:05 [name], [name] ([id number]) -------------------------------------------------------------------------------- Problem List Details Patient Name: Erik [name], [name] Date of Service: 06/04/2021 12:30 PM Medical Record Number: [id number] Patient Account Number: [id number] Date of Birth/Sex: [birth year] (75 y.o. M) Treating RN:  Carlene Coria Primary Care Provider: [name] Other Clinician: Referring Provider: [name] Treating Provider/Extender: Yaakov Guthrie in Treatment: 1 Active Problems ICD-10 Encounter Code Description Active Date MDM Diagnosis I87.311 Chronic venous hypertension (idiopathic) with ulcer of right lower 05/28/2021 No Yes extremity I89.0 Lymphedema, not elsewhere classified 05/28/2021 No Yes J44.9 Chronic obstructive pulmonary disease, unspecified 05/28/2021 No Yes Inactive Problems Resolved Problems Electronic Signature(s) Signed: 06/04/2021 1:56:35 PM By: Kalman Shan DO Entered By: Kalman Shan on 06/04/2021 13:48:14 [name], [name] ([id number]) -------------------------------------------------------------------------------- Progress Note Details Patient Name: [name], [name] Date of Service: 06/04/2021 12:30 PM Medical Record Number: [id number] Patient Account Number: [id number] Date of Birth/Sex: [birth year] (75 y.o. M) Treating RN: Carlene Coria Primary Care Provider: [name] Other Clinician: Referring Provider: [name] Treating Provider/Extender: Yaakov Guthrie in Treatment: 1 Subjective Chief Complaint Information obtained from Patient Right lower extremity wound History of Present Illness (HPI) Admission 05/28/2021 Mr. Daray Polgar is a 75 year old male with a past medical history of lymphedema, chronic venous insufficiency, COPD and Rita Ohara myasthenic syndrome that presents to the clinic for a 1 year history of skin changes with increased bilateral lower extremity edema right greater than left now with developing wounds to his right lower extremity. He was evaluated by podiatry on 10/11 who referred him to our clinic for this issue. At that time he was started on mupirocin and Keflex for probable cellulitis secondary to lymphedema. He currently denies signs of infection. He does not have  compression stockings He is accompanied by his daughter who helps provide the history as well. 10/26; patient presents for follow-up. He has been using silver alginate with dressing changes. The area is healed. He is not sure if he is received his compression stockings. His daughter who helps provide the history is not present today. He is not scheduled his ABIs with TBI's or venous reflux studies. He has no issues or complaints today. He denies signs of infection. Patient History Information obtained from Patient. Social History Former smoker, Marital Status - Separated, Alcohol Use - Never, Drug Use - No History, Caffeine Use - Daily. Medical History Respiratory Patient has history of Chronic Obstructive Pulmonary Disease (COPD) Objective Constitutional Vitals Time Taken: 12:50 PM, Weight: 198 lbs, Temperature: 98.2 F, Pulse: 97 bpm, Respiratory Rate: 18 breaths/min, Blood Pressure: 167/84 mmHg. General Notes: Chronic lymphedema skin changes bilaterally with nonpitting edema  to the knees. No active open wounds present. No drainage noted. Difficult to palpate pedal pulses. No obvious signs of infection. Integumentary (Hair, Skin) Wound #1 status is Open. Original cause of wound was Gradually Appeared. The date acquired was: 05/10/2021. The wound has been in treatment 1 weeks. The wound is located on the Right,Distal Lower Leg. The wound measures 0.1cm length x 0.1cm width x 0.1cm depth; 0.008cm^2 area and 0.001cm^3 volume. There is no tunneling or undermining noted. There is a none present amount of drainage noted. There is large (67- 100%) granulation within the wound bed. There is no necrotic tissue within the wound bed. Assessment Active Problems ICD-10 Erik [name], [name] ([id number]) Chronic venous hypertension (idiopathic) with ulcer of right lower extremity Lymphedema, not elsewhere classified Chronic obstructive pulmonary disease, unspecified Patient has no active open wounds  at this time. No signs of infection. He did well with silver alginate. At this time I recommended following up in 1 week to assure that there are no further issues. I asked at that time that he bring his Velcro compression stockings to help show him how to place these. I also recommended obtaining his ABIs with TBI's and venous reflux studies ordered at last clinic visit. Our intake nurse is going to help coordinate this with daughter as well. Follow-up in 1 week. Plan Follow-up Appointments: Return Appointment in 1 week. Bathing/ Shower/ Hygiene: May shower; gently cleanse wound with antibacterial soap, rinse and pat dry prior to dressing wounds Edema Control - Lymphedema / Segmental Compressive Device / Other: Elevate, Exercise Daily and Avoid Standing for Long Periods of Time. Elevate legs to the level of the heart and pump ankles as often as possible Elevate leg(s) parallel to the floor when sitting. Off-Loading: Turn and reposition every 2 hours WOUND #1: - Lower Leg Wound Laterality: Right, Distal Cleanser: Byram Ancillary Kit - 15 Day Supply (Generic) 1 x Per Day/30 Days Discharge Instructions: Use supplies as instructed; Kit contains: (15) Saline Bullets; (15) 3x3 Gauze; 15 pr Gloves Cleanser: Soap and Water 1 x Per Day/30 Days Discharge Instructions: Gently cleanse wound with antibacterial soap, rinse and pat dry prior to dressing wounds Primary Dressing: Silvercel 4 1/4x 4 1/4 (in/in) (Generic) 1 x Per Day/30 Days Discharge Instructions: Apply Silvercel 4 1/4x 4 1/4 (in/in) as instructed Secondary Dressing: Zetuvit Plus Silicone Non-bordered 5x5 (in/in) (Generic) 1 x Per Day/30 Days Compression Stockings: Circaid Juxta Lite Compression Wrap Compression Amount: 20-30 mmHg (right) Discharge Instructions: Apply Circaid Juxta Lite Compression Wrap as directed 1. Follow-up in 1 week 2. Velcro Compression stockings Electronic Signature(s) Signed: 06/04/2021 1:56:35 PM By: Kalman Shan DO Entered By: Kalman Shan on 06/04/2021 13:55:14 Erik [name], [name] ([id number]) -------------------------------------------------------------------------------- ROS/PFSH Details Patient Name: Erik [name], [name] Date of Service: 06/04/2021 12:30 PM Medical Record Number: [id number] Patient Account Number: [id number] Date of Birth/Sex: [birth year] (75 y.o. M) Treating RN: Carlene Coria Primary Care Provider: [name] Other Clinician: Referring Provider: [name] Treating Provider/Extender: Yaakov Guthrie in Treatment: 1 Information Obtained From Patient Respiratory Medical History: Positive for: Chronic Obstructive Pulmonary Disease (COPD) Immunizations Pneumococcal Vaccine: Received Pneumococcal Vaccination: No Implantable Devices None Family and Social History Former smoker; Marital Status - Separated; Alcohol Use: Never; Drug Use: No History; Caffeine Use: Daily; Financial Concerns: No; Food, Clothing or Shelter Needs: No; Support System Lacking: No; Transportation Concerns: No Electronic Signature(s) Signed: 06/04/2021 1:56:35 PM By: Kalman Shan DO Signed: 06/09/2021 6:56:11 PM By: Carlene Coria RN Entered By: Kalman Shan on 06/04/2021 13:49:29  Erik [name], Erik [name] ([id number]) -------------------------------------------------------------------------------- SuperBill Details Patient Name: Verbeke, Savier L. Date of Service: 06/04/2021 Medical Record Number: [id number] Patient Account Number: [id number] Date of Birth/Sex: 06-22-1946 (75 y.o. M) Treating RN: Carlene Coria Primary Care Provider: [name] Other Clinician: Referring Provider: [name] Treating Provider/Extender: Yaakov Guthrie in Treatment: 1 Diagnosis Coding ICD-10 Codes Code Description I87.311 Chronic venous hypertension (idiopathic) with ulcer of right lower extremity J44.9 Chronic obstructive pulmonary disease,  unspecified I89.0 Lymphedema, not elsewhere classified Facility Procedures CPT4 Code: [id number] Description: [phone] - WOUND CARE VISIT-LEV 2 EST PT Modifier: Quantity: 1 Physician Procedures CPT4 Code: 4591368 Description: 59923 - WC PHYS LEVEL 3 - EST PT Modifier: Quantity: 1 CPT4 Code: Description: ICD-10 Diagnosis Description I87.311 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

## 2021-06-10 NOTE — Progress Notes (Signed)
Hone, [place] ([id number]) Visit Report for 06/04/2021 Arrival Information Details Patient Name: Erik [name], Erik [name]. Date of Service: 06/04/2021 12:30 PM Medical Record Number: [id number] Patient Account Number: [id number] Date of Birth/Sex: [birth year] (76 y.o. M) Treating RN: Carlene Coria Primary Care Yahir Tavano: [name] Other Clinician: Referring Cassadi Purdie: [name] Treating Suann Klier/Extender: Yaakov Guthrie in Treatment: 1 Visit Information History Since Last Visit All ordered tests and consults were completed: No Patient Arrived: Ambulatory Added or deleted any medications: No Arrival Time: 12:49 Any new allergies or adverse reactions: No Accompanied By: self Had a fall or experienced change in No Transfer Assistance: None activities of daily living that may affect Patient Identification Verified: Yes risk of falls: Secondary Verification Process Completed: Yes Signs or symptoms of abuse/neglect since last visito No Patient Requires Transmission-Based Precautions: No Hospitalized since last visit: No Patient Has Alerts: No Implantable device outside of the clinic excluding No cellular tissue based products placed in the center since last visit: Has Dressing in Place as Prescribed: Yes Pain Present Now: No Electronic Signature(s) Signed: 06/09/2021 6:56:11 PM By: Carlene Coria RN Entered By: Carlene Coria on 06/04/2021 12:49:46 Erik [name], [name] ([id number]) -------------------------------------------------------------------------------- Clinic Level of Care Assessment Details Patient Name: [name], [name] Date of Service: 06/04/2021 12:30 PM Medical Record Number: [id number] Patient Account Number: [id number] Date of Birth/Sex: January 25, 1946 (75 y.o. M) Treating RN: Carlene Coria Primary Care Kaiyden Simkin: [name] Other Clinician: Referring Prinston Kynard: [name] Treating Christene Pounds/Extender: Yaakov Guthrie in  Treatment: 1 Clinic Level of Care Assessment Items TOOL 4 Quantity Score X - Use when only an EandM is performed on FOLLOW-UP visit 1 0 ASSESSMENTS - Nursing Assessment / Reassessment X - Reassessment of Co-morbidities (includes updates in patient status) 1 10 X- 1 5 Reassessment of Adherence to Treatment Plan ASSESSMENTS - Wound and Skin Assessment / Reassessment X - Simple Wound Assessment / Reassessment - one wound 1 5 []  - 0 Complex Wound Assessment / Reassessment - multiple wounds []  - 0 Dermatologic / Skin Assessment (not related to wound area) ASSESSMENTS - Focused Assessment []  - Circumferential Edema Measurements - multi extremities 0 []  - 0 Nutritional Assessment / Counseling / Intervention []  - 0 Lower Extremity Assessment (monofilament, tuning fork, pulses) []  - 0 Peripheral Arterial Disease Assessment (using hand held doppler) ASSESSMENTS - Ostomy and/or Continence Assessment and Care []  - Incontinence Assessment and Management 0 []  - 0 Ostomy Care Assessment and Management (repouching, etc.) PROCESS - Coordination of Care X - Simple Patient / Family Education for ongoing care 1 15 []  - 0 Complex (extensive) Patient / Family Education for ongoing care []  - 0 Staff obtains Programmer, systems, Records, Test Results / Process Orders []  - 0 Staff telephones HHA, Nursing Homes / Clarify orders / etc []  - 0 Routine Transfer to another Facility (non-emergent condition) []  - 0 Routine Hospital Admission (non-emergent condition) []  - 0 New Admissions / Biomedical engineer / Ordering NPWT, Apligraf, etc. []  - 0 Emergency Hospital Admission (emergent condition) X- 1 10 Simple Discharge Coordination []  - 0 Complex (extensive) Discharge Coordination PROCESS - Special Needs []  - Pediatric / Minor Patient Management 0 []  - 0 Isolation Patient Management []  - 0 Hearing / Language / Visual special needs []  - 0 Assessment of Community assistance (transportation, D/C  planning, etc.) []  - 0 Additional assistance / Altered mentation []  - 0 Support Surface(s) Assessment (bed, cushion, seat, etc.) INTERVENTIONS - Wound Cleansing / Measurement [name], [name] ([id number]) X- 1 5 Simple Wound Cleansing -  one wound $RemoveB'[]'GZsyPpms$  - 0 Complex Wound Cleansing - multiple wounds X- 1 5 Wound Imaging (photographs - any number of wounds) $RemoveBe'[]'eQIcxxLjB$  - 0 Wound Tracing (instead of photographs) X- 1 5 Simple Wound Measurement - one wound $RemoveB'[]'GyUTVRoD$  - 0 Complex Wound Measurement - multiple wounds INTERVENTIONS - Wound Dressings X - Small Wound Dressing one or multiple wounds 1 10 $Re'[]'sfN$  - 0 Medium Wound Dressing one or multiple wounds $RemoveBeforeD'[]'LCdTozSwzluhEK$  - 0 Large Wound Dressing one or multiple wounds $RemoveBeforeD'[]'TzwDVRbFASwxSt$  - 0 Application of Medications - topical $RemoveB'[]'lIgAZSCx$  - 0 Application of Medications - injection INTERVENTIONS - Miscellaneous $RemoveBeforeD'[]'xFnVAbGRQGqXKk$  - External ear exam 0 $Remo'[]'gBvVR$  - 0 Specimen Collection (cultures, biopsies, blood, body fluids, etc.) $RemoveBefor'[]'xaKIVHPgAdyf$  - 0 Specimen(s) / Culture(s) sent or taken to Lab for analysis $RemoveBefo'[]'NlPIqcLNlbC$  - 0 Patient Transfer (multiple staff / Civil Service fast streamer / Similar devices) $RemoveBeforeDE'[]'ytgbyYXFCugqswV$  - 0 Simple Staple / Suture removal (25 or less) $Remove'[]'LGwvCnb$  - 0 Complex Staple / Suture removal (26 or more) $Remove'[]'ChyzWAs$  - 0 Hypo / Hyperglycemic Management (close monitor of Blood Glucose) $RemoveBefore'[]'CpkydgBaVpxtd$  - 0 Ankle / Brachial Index (ABI) - do not check if billed separately X- 1 5 Vital Signs Has the patient been seen at the hospital within the last three years: Yes Total Score: 75 Level Of Care: New/Established - Level 2 Electronic Signature(s) Signed: 06/09/2021 6:56:11 PM By: Carlene Coria RN Entered By: Carlene Coria on 06/04/2021 13:31:46 Erik [name], [name] ([id number]) -------------------------------------------------------------------------------- Encounter Discharge Information Details Patient Name: [name], [name] Date of Service: 06/04/2021 12:30 PM Medical Record Number: [id number] Patient Account Number: [id number] Date of Birth/Sex: 06/16/1946  (75 y.o. M) Treating RN: Carlene Coria Primary Care Alonna Bartling: [name] Other Clinician: Referring Samuel Mcpeek: [name] Treating Temple Sporer/Extender: Yaakov Guthrie in Treatment: 1 Encounter Discharge Information Items Discharge Condition: Stable Ambulatory Status: Ambulatory Discharge Destination: Home Transportation: Private Auto Accompanied By: self Schedule Follow-up Appointment: Yes Clinical Summary of Care: Patient Declined Electronic Signature(s) Signed: 06/09/2021 6:56:11 PM By: Carlene Coria RN Entered By: Carlene Coria on 06/04/2021 13:34:20 Erik [name], [name] ([id number]) -------------------------------------------------------------------------------- Lower Extremity Assessment Details Patient Name: Martindale, Raymondo L. Date of Service: 06/04/2021 12:30 PM Medical Record Number: [id number] Patient Account Number: [id number] Date of Birth/Sex: [birth year] (75 y.o. M) Treating RN: Carlene Coria Primary Care Cadance Raus: [name] Other Clinician: Referring Campbell Kray: [name] Treating Essie Gehret/Extender: Yaakov Guthrie in Treatment: 1 Edema Assessment Assessed: [Left: No] [Right: No] Edema: [Left: Ye] [Right: s] Calf Left: Right: Point of Measurement: 37 cm From Medial Instep 42 cm Ankle Left: Right: Point of Measurement: 11 cm From Medial Instep 27 cm Vascular Assessment Pulses: Dorsalis Pedis Palpable: [Right:Yes] Electronic Signature(s) Signed: 06/09/2021 6:56:11 PM By: Carlene Coria RN Entered By: Carlene Coria on 06/04/2021 12:51:30 Erik [name], Erik [name] ([id number]) -------------------------------------------------------------------------------- Multi Wound Chart Details Patient Name: Brandi, Riddick L. Date of Service: 06/04/2021 12:30 PM Medical Record Number: [id number] Patient Account Number: [id number] Date of Birth/Sex: [birth year] (75 y.o. M) Treating RN: Carlene Coria Primary Care Wade Asebedo: [name] Other Clinician: Referring Tyrell Brereton: [name] Treating Autry Droege/Extender: Yaakov Guthrie in Treatment: 1 Vital Signs Height(in): Pulse(bpm): 97 Weight(lbs): 198 Blood Pressure(mmHg): 167/84 Body Mass Index(BMI): Temperature(F): 98.2 Respiratory Rate(breaths/min): 18 Photos: [1:No Photos] [N/A:N/A] Wound Location: [1:Right, Distal Lower Leg] [N/A:N/A] Wounding Event: [1:Gradually Appeared] [N/A:N/A] Primary Etiology: [1:Venous Leg Ulcer] [N/A:N/A] Comorbid History: [1:Chronic Obstructive Pulmonary Disease (COPD)] [N/A:N/A] Date Acquired: [1:05/10/2021] [N/A:N/A] Weeks of Treatment: [1:1] [N/A:N/A] Wound Status: [1:Open] [N/A:N/A] Measurements L x W x D (cm) [1:0.1x0.1x0.1] [N/A:N/A] Area (cm) : [1:0.008] [N/A:N/A] Volume (  cm) : [1:0.001] [N/A:N/A] % Reduction in Area: [1:99.20%] [N/A:N/A] % Reduction in Volume: [1:99.00%] [N/A:N/A] Classification: [1:Full Thickness Without Exposed Support Structures] [N/A:N/A] Exudate Amount: [1:None Present] [N/A:N/A] Granulation Amount: [1:Large (67-100%)] [N/A:N/A] Necrotic Amount: [1:None Present (0%)] [N/A:N/A] Exposed Structures: [1:Fascia: No Fat Layer (Subcutaneous Tissue): No Tendon: No Muscle: No Joint: No Bone: No None] [N/A:N/A N/A] Treatment Notes Wound #1 (Lower Leg) Wound Laterality: Right, Distal Cleanser Byram Ancillary Kit - 15 Day Supply Discharge Instruction: Use supplies as instructed; Kit contains: (15) Saline Bullets; (15) 3x3 Gauze; 15 pr Gloves Soap and Water Discharge Instruction: Gently cleanse wound with antibacterial soap, rinse and pat dry prior to dressing wounds Peri-Wound Care Topical Primary Dressing Silvercel 4 1/4x 4 1/4 (in/in) Discharge Instruction: Apply Silvercel 4 1/4x 4 1/4 (in/in) as instructed Secondary Dressing Zetuvit Plus Silicone Non-bordered 5x5 (in/in) Secured With Walther, Bo L. ([id number]) Compression Wrap Compression Stockings Circaid Juxta Lite  Compression Wrap Quantity: 1 Right Leg Compression Amount: 20-30 mmHg Discharge Instruction: Apply Circaid Juxta Lite Compression Wrap as directed Add-Ons Electronic Signature(s) Signed: 06/04/2021 1:56:35 PM By: Kalman Shan DO Entered By: Kalman Shan on 06/04/2021 13:48:19 Erik [name], [name] ([id number]) -------------------------------------------------------------------------------- [place] Details Patient Name: Erik [name], [name] Date of Service: 06/04/2021 12:30 PM Medical Record Number: [id number] Patient Account Number: [id number] Date of Birth/Sex: May 20, 1946 (75 y.o. M) Treating RN: Carlene Coria Primary Care Ules Marsala: [name] Other Clinician: Referring Yessika Otte: [name] Treating Garo Heidelberg/Extender: Yaakov Guthrie in Treatment: 1 Active Inactive Wound/Skin Impairment Nursing Diagnoses: Knowledge deficit related to ulceration/compromised skin integrity Goals: Patient/caregiver will verbalize understanding of skin care regimen Date Initiated: 05/28/2021 Target Resolution Date: 06/28/2021 Goal Status: Active Ulcer/skin breakdown will have a volume reduction of 30% by week 4 Date Initiated: 05/28/2021 Target Resolution Date: 07/28/2021 Goal Status: Active Ulcer/skin breakdown will have a volume reduction of 50% by week 8 Date Initiated: 05/28/2021 Target Resolution Date: 08/28/2021 Goal Status: Active Ulcer/skin breakdown will have a volume reduction of 80% by week 12 Date Initiated: 05/28/2021 Target Resolution Date: 09/28/2021 Goal Status: Active Ulcer/skin breakdown will heal within 14 weeks Date Initiated: 05/28/2021 Target Resolution Date: 10/26/2021 Goal Status: Active Interventions: Assess patient/caregiver ability to obtain necessary supplies Assess patient/caregiver ability to perform ulcer/skin care regimen upon admission and as needed Assess ulceration(s) every visit Notes: Electronic  Signature(s) Signed: 06/09/2021 6:56:11 PM By: Carlene Coria RN Entered By: Carlene Coria on 06/04/2021 13:29:58 Erik [name], [name] ([id number]) -------------------------------------------------------------------------------- Pain Assessment Details Patient Name: Erik [name], [name] Date of Service: 06/04/2021 12:30 PM Medical Record Number: [id number] Patient Account Number: [id number] Date of Birth/Sex: 1946-06-03 (75 y.o. M) Treating RN: Carlene Coria Primary Care Shmuel Girgis: [name] Other Clinician: Referring Vincent Ehrler: [name] Treating Lisvet Rasheed/Extender: Yaakov Guthrie in Treatment: 1 Active Problems Location of Pain Severity and Description of Pain Patient Has Paino No Site Locations Pain Management and Medication Current Pain Management: Electronic Signature(s) Signed: 06/09/2021 6:56:11 PM By: Carlene Coria RN Entered By: Carlene Coria on 06/04/2021 12:50:27 Erik [name], [name] ([id number]) -------------------------------------------------------------------------------- Patient/Caregiver Education Details Patient Name: Erik [name], [name] Date of Service: 06/04/2021 12:30 PM Medical Record Number: [id number] Patient Account Number: [id number] Date of Birth/Gender: Jul 20, 1946 (75 y.o. M) Treating RN: Carlene Coria Primary Care Physician: [name] Other Clinician: Referring Physician: [name] Treating Physician/Extender: Yaakov Guthrie in Treatment: 1 Education Assessment Education Provided To: Patient Education Topics Provided Wound/Skin Impairment: Methods: Explain/Verbal Responses: State content correctly Electronic Signature(s) Signed: 06/09/2021 6:56:11 PM By: Carlene Coria RN Entered By: Carlene Coria on 06/04/2021  13:33:46 Erik [name], [name] ([id number]) -------------------------------------------------------------------------------- Wound Assessment Details Patient Name: Erik [name], [name] Date of  Service: 06/04/2021 12:30 PM Medical Record Number: [id number] Patient Account Number: [id number] Date of Birth/Sex: 10/22/1945 (75 y.o. M) Treating RN: Carlene Coria Primary Care Janisa Labus: [name] Other Clinician: Referring Miosha Behe: [name] Treating Tremont Gavitt/Extender: Yaakov Guthrie in Treatment: 1 Wound Status Wound Number: 1 Primary Etiology: Venous Leg Ulcer Wound Location: Right, Distal Lower Leg Wound Status: Open Wounding Event: Gradually Appeared Comorbid History: Chronic Obstructive Pulmonary Disease (COPD) Date Acquired: 05/10/2021 Weeks Of Treatment: 1 Clustered Wound: No Wound Measurements Length: (cm) 0.1 Width: (cm) 0.1 Depth: (cm) 0.1 Area: (cm) 0.008 Volume: (cm) 0.001 % Reduction in Area: 99.2% % Reduction in Volume: 99% Epithelialization: None Tunneling: No Undermining: No Wound Description Classification: Full Thickness Without Exposed Support Structures Exudate Amount: None Present Foul Odor After Cleansing: No Slough/Fibrino No Wound Bed Granulation Amount: Large (67-100%) Exposed Structure Necrotic Amount: None Present (0%) Fascia Exposed: No Fat Layer (Subcutaneous Tissue) Exposed: No Tendon Exposed: No Muscle Exposed: No Joint Exposed: No Bone Exposed: No Treatment Notes Wound #1 (Lower Leg) Wound Laterality: Right, Distal Cleanser Byram Ancillary Kit - 15 Day Supply Discharge Instruction: Use supplies as instructed; Kit contains: (15) Saline Bullets; (15) 3x3 Gauze; 15 pr Gloves Soap and Water Discharge Instruction: Gently cleanse wound with antibacterial soap, rinse and pat dry prior to dressing wounds Peri-Wound Care Topical Primary Dressing Silvercel 4 1/4x 4 1/4 (in/in) Discharge Instruction: Apply Silvercel 4 1/4x 4 1/4 (in/in) as instructed Secondary Dressing Zetuvit Plus Silicone Non-bordered 5x5 (in/in) Secured With Compression Wrap Compression Stockings Circaid Juxta Lite Compression  Wrap Smith, Keri L. ([id number]) Quantity: 1 Right Leg Compression Amount: 20-30 mmHg Discharge Instruction: Apply Circaid Juxta Lite Compression Wrap as directed Add-Ons Electronic Signature(s) Signed: 06/09/2021 6:56:11 PM By: Carlene Coria RN Entered By: Carlene Coria on 06/04/2021 12:50:51 Marchese, Syris L. ([id number]) -------------------------------------------------------------------------------- Vitals Details Patient Name: Liese, Adar L. Date of Service: 06/04/2021 12:30 PM Medical Record Number: [id number] Patient Account Number: [id number] Date of Birth/Sex: [birth year] (75 y.o. M) Treating RN: Carlene Coria Primary Care Jaiquan Temme: [name] Other Clinician: Referring Sheketa Ende: [name] Treating Evania Lyne/Extender: Yaakov Guthrie in Treatment: 1 Vital Signs Time Taken: 12:50 Temperature (F): 98.2 Weight (lbs): 198 Pulse (bpm): 97 Respiratory Rate (breaths/min): 18 Blood Pressure (mmHg): 167/84 Reference Range: 80 - 120 mg / dl Electronic Signature(s) Signed: 06/09/2021 6:56:11 PM By: Carlene Coria RN Entered By: Carlene Coria on 06/04/2021 12:50:21

## 2021-06-11 ENCOUNTER — Other Ambulatory Visit: Payer: Self-pay

## 2021-06-11 ENCOUNTER — Encounter: Payer: Medicare Other | Attending: Internal Medicine | Admitting: Internal Medicine

## 2021-06-11 DIAGNOSIS — I872 Venous insufficiency (chronic) (peripheral): Secondary | ICD-10-CM | POA: Diagnosis not present

## 2021-06-11 DIAGNOSIS — I87311 Chronic venous hypertension (idiopathic) with ulcer of right lower extremity: Secondary | ICD-10-CM | POA: Diagnosis not present

## 2021-06-11 DIAGNOSIS — J449 Chronic obstructive pulmonary disease, unspecified: Secondary | ICD-10-CM

## 2021-06-11 DIAGNOSIS — I890 Lymphedema, not elsewhere classified: Secondary | ICD-10-CM

## 2021-06-13 NOTE — Progress Notes (Signed)
Erik [name], Erik [name] ([id number]) Visit Report for 06/11/2021 Chief Complaint Document Details Patient Name: Erik [name], [name] Date of Service: 06/11/2021 3:00 PM Medical Record Number: [id number] Patient Account Number: [id number] Date of Birth/Sex: [birth year] (75 y.o. M) Treating RN: Carlene Coria Primary Care Provider: [name] Other Clinician: Referring Provider: [name] Treating Provider/Extender: Yaakov Guthrie in Treatment: 2 Information Obtained from: Patient Chief Complaint Right lower extremity wound Electronic Signature(s) Signed: 06/11/2021 5:19:05 PM By: Kalman Shan DO Entered By: Kalman Shan on 06/11/2021 17:11:44 Erik [name], [name] ([id number]) -------------------------------------------------------------------------------- HPI Details Patient Name: Erik [name], [name] Date of Service: 06/11/2021 3:00 PM Medical Record Number: [id number] Patient Account Number: [id number] Date of Birth/Sex: 12-20-1945 (75 y.o. M) Treating RN: Carlene Coria Primary Care Provider: [name] Other Clinician: Referring Provider: [name] Treating Provider/Extender: Yaakov Guthrie in Treatment: 2 History of Present Illness HPI Description: Admission 05/28/2021 Mr. Ruthvik Barnaby is a 75 year old male with a past medical history of lymphedema, chronic venous insufficiency, COPD and LambertoEaton myasthenic syndrome that presents to the clinic for a 1 year history of skin changes with increased bilateral lower extremity edema right greater than left now with developing wounds to his right lower extremity. He was evaluated by podiatry on 10/11 who referred him to our clinic for this issue. At that time he was started on mupirocin and Keflex for probable cellulitis secondary to lymphedema. He currently denies signs of infection. He does not have compression stockings He is accompanied by his daughter who helps provide the  history as well. 10/26; patient presents for follow-up. He has been using silver alginate with dressing changes. The area is healed. He is not sure if he is received his compression stockings. His daughter who helps provide the history is not present today. He is not scheduled his ABIs with TBI's or venous reflux studies. He has no issues or complaints today. He denies signs of infection. 11/2; patient presents for follow-up. His wound remains closed. He received his juxta light compression and brought these in today. He would like for his daughter to be present To learn how to place these. She is not able to come in this week but can come in next week. Electronic Signature(s) Signed: 06/11/2021 5:19:05 PM By: Kalman Shan DO Entered By: Kalman Shan on 06/11/2021 17:13:30 Lastra, Keyandre L. ([id number]) -------------------------------------------------------------------------------- Physical Exam Details Patient Name: Erik [name], [name] Date of Service: 06/11/2021 3:00 PM Medical Record Number: [id number] Patient Account Number: [id number] Date of Birth/Sex: 1945/12/12 (75 y.o. M) Treating RN: Carlene Coria Primary Care Provider: [name] Other Clinician: Referring Provider: [name] Treating Provider/Extender: Yaakov Guthrie in Treatment: 2 Constitutional . Psychiatric . Notes Chronic lymphedema skin changes bilaterally with nonpitting edema to the knees. No active open wounds present. No drainage noted. Difficult to palpate pedal pulses. No obvious signs of infection. Electronic Signature(s) Signed: 06/11/2021 5:19:05 PM By: Kalman Shan DO Entered By: Kalman Shan on 06/11/2021 17:17:23 Trupiano, Glenn L. ([id number]) -------------------------------------------------------------------------------- Physician Orders Details Patient Name: Erik [name], [name] Date of Service: 06/11/2021 3:00 PM Medical Record Number: [id number] Patient  Account Number: [id number] Date of Birth/Sex: 1945-10-18 (75 y.o. M) Treating RN: Carlene Coria Primary Care Provider: [name] Other Clinician: Referring Provider: [name] Treating Provider/Extender: Yaakov Guthrie in Treatment: 2 Verbal / Phone Orders: No Diagnosis Coding Follow-up Appointments o Return Appointment in 1 week. - teaching daughter how to apply juxtalites Edema Control - Lymphedema / Segmental Compressive Device / Other o Elevate, Exercise Daily and Avoid Standing  for Long Periods of Time. o Elevate legs to the level of the heart and pump ankles as often as possible o Elevate leg(s) parallel to the floor when sitting. Wound Treatment Electronic Signature(s) Signed: 06/11/2021 5:19:05 PM By: Kalman Shan DO Signed: 06/12/2021 5:07:02 PM By: Carlene Coria RN Entered By: Carlene Coria on 06/11/2021 16:14:22 Erik [name], [name] ([id number]) -------------------------------------------------------------------------------- Problem List Details Patient Name: Erik [name], [name] Date of Service: 06/11/2021 3:00 PM Medical Record Number: [id number] Patient Account Number: [id number] Date of Birth/Sex: August 29, 1945 (75 y.o. 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  ([id number]) -------------------------------------------------------------------------------- Progress Note Details Patient Name: Erik [name], [name] Date of Service: 06/11/2021 3:00 PM Medical Record Number: [id number] Patient Account Number: [id number] Date of Birth/Sex: November 22, 1945 (75 y.o. M) Treating RN: Carlene Coria Primary Care Provider: [name] Other Clinician: Referring Provider: [name] Treating Provider/Extender: Yaakov Guthrie in Treatment: 2 Subjective Chief Complaint Information obtained from Patient Right lower extremity wound History of Present Illness (HPI) Admission 05/28/2021 Mr. Nolin Grell is a 75 year old male with a past medical history of lymphedema, chronic venous insufficiency, COPD and Rita Ohara myasthenic syndrome that presents to the clinic for a 1 year history of skin changes with increased bilateral lower extremity edema right greater than left now with developing wounds to his right lower extremity. He was evaluated by podiatry on 10/11 who referred him to our clinic for this issue. At that time he was started on mupirocin and Keflex for probable cellulitis secondary to lymphedema. He currently denies signs of infection. He does not have compression stockings He is accompanied by his daughter who helps provide the history as well. 10/26; patient presents for follow-up. He has been using silver alginate with dressing changes. The area is healed. He is not sure if he is received his compression stockings. His daughter who helps provide the history is not present today. He is not scheduled his ABIs with TBI's or venous reflux studies. He has no issues or complaints today. He denies signs of infection. 11/2; patient presents for follow-up. His wound remains closed. He received his juxta light compression and brought these in today. He would like for his daughter to be present To learn how to place these. She is not able to  come in this week but can come in next week. Patient History Information obtained from Patient. Social History Former smoker, Marital Status - Separated, Alcohol Use - Never, Drug Use - No History, Caffeine Use - Daily. Medical History Respiratory Patient has history of Chronic Obstructive Pulmonary Disease (COPD) Objective Constitutional Vitals Time Taken: 3:24 PM, Weight: 198 lbs, Temperature: 97.9 F, Pulse: 94 bpm, Respiratory Rate: 18 breaths/min, Blood Pressure: 144/69 mmHg. General Notes: Chronic lymphedema skin changes bilaterally with nonpitting edema to the knees. No active open wounds present. No drainage noted. Difficult to palpate pedal pulses. No obvious signs of infection. Integumentary (Hair, Skin) Wound #1 status is Open. Original cause of wound was Gradually Appeared. The date acquired was: 05/10/2021. The wound has been in treatment 2  weeks. The wound is located on the Right,Distal Lower Leg. The wound measures 0cm length x 0cm width x 0cm depth; 0cm^2 area and 0cm^3 volume. There is no tunneling or undermining noted. There is a none present amount of drainage noted. There is no granulation within the wound bed. There is no necrotic tissue within the wound bed. Assessment Erik [name], [name] ([id number]) Active Problems ICD-10 Chronic venous hypertension (idiopathic) with ulcer of right lower extremity Lymphedema, not elsewhere classified Chronic obstructive pulmonary disease, unspecified Patient's wound remains closed. Unfortunately daughter cannot be present today to observe how to place juxta light compressions. Patient would like daughter present for this. Follow-up in 1 week Plan Follow-up Appointments: Return Appointment in 1 week. - teaching daughter how to apply juxtalites Edema Control - Lymphedema / Segmental Compressive Device / Other: Elevate, Exercise Daily and Avoid Standing for Long Periods of Time. Elevate legs to the level of the heart and pump  ankles as often as possible Elevate leg(s) parallel to the floor when sitting. 1. Follow-up in 1 week Electronic Signature(s) Signed: 06/11/2021 5:19:05 PM By: Kalman Shan DO Entered By: Kalman Shan on 06/11/2021 17:18:28 Erik [name], [name] ([id number]) -------------------------------------------------------------------------------- ROS/PFSH Details Patient Name: Erik [name], [name] Date of Service: 06/11/2021 3:00 PM Medical Record Number: [id number] Patient Account Number: [id number] Date of Birth/Sex: Sep 20, 1945 (75 y.o. M) Treating RN: Carlene Coria Primary Care Provider: [name] Other Clinician: Referring Provider: [name] Treating Provider/Extender: Yaakov Guthrie in Treatment: 2 Information Obtained From Patient Respiratory Medical History: Positive for: Chronic Obstructive Pulmonary Disease (COPD) Immunizations Pneumococcal Vaccine: Received Pneumococcal Vaccination: No Implantable Devices None Family and Social History Former smoker; Marital Status - Separated; Alcohol Use: Never; Drug Use: No History; Caffeine Use: Daily; Financial Concerns: No; Food, Clothing or Shelter Needs: No; Support System Lacking: No; Transportation Concerns: No Electronic Signature(s) Signed: 06/11/2021 5:19:05 PM By: Kalman Shan DO Signed: 06/12/2021 5:07:02 PM By: Carlene Coria RN Entered By: Kalman Shan on 06/11/2021 17:13:36 Erik [name], Jhett L. ([id number]) -------------------------------------------------------------------------------- SuperBill Details Patient Name: Kandel, Boluwatife L. Date of Service: 06/11/2021 Medical Record Number: [id number] Patient Account Number: [id number] Date of Birth/Sex: Dec 12, 1945 (75 y.o. 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 J44.9 Chronic obstructive pulmonary disease, unspecified I89.0 Lymphedema, not elsewhere classified Facility Procedures CPT4 Code: [id number] Description: [phone] - WOUND CARE VISIT-LEV 2 EST PT Modifier: Quantity: 1 Physician Procedures CPT4 Code: [id number] Description: [id number] - WC PHYS LEVEL 3 - EST PT Modifier: Quantity: 1 CPT4 Code: Description: ICD-10 Diagnosis Description I87.311 Chronic venous hypertension (idiopathic) with ulcer of right lower ext J44.9 Chronic obstructive pulmonary disease, unspecified I89.0 Lymphedema, not elsewhere classified Modifier: remity Quantity: Electronic Signature(s) Signed: 06/11/2021 5:19:05 PM By: Kalman Shan DO Entered By: Kalman Shan on 06/11/2021 17:18:41

## 2021-06-13 NOTE — Progress Notes (Signed)
Erik [name] ([id number]) Visit Report for 06/11/2021 Arrival Information Details Patient Name: Erik [name], Erik [name]. Date of Service: 06/11/2021 3:00 PM Medical Record Number: [id number] Patient Account Number: [id number] Date of Birth/Sex: 05-19-1946 (75 y.o. M) Treating RN: Carlene Coria Primary Care Tarynn Garling: [name] Other Clinician: Referring Ziad Maye: [name] Treating Gurtha Picker/Extender: Yaakov Guthrie in Treatment: 2 Visit Information History Since Last Visit All ordered tests and consults were completed: No Patient Arrived: Ambulatory Added or deleted any medications: No Arrival Time: 15:23 Any new allergies or adverse reactions: No Accompanied By: self Had a fall or experienced change in No Transfer Assistance: None activities of daily living that may affect Patient Identification Verified: Yes risk of falls: Secondary Verification Process Completed: Yes Signs or symptoms of abuse/neglect since last visito No Patient Requires Transmission-Based Precautions: No Hospitalized since last visit: No Patient Has Alerts: No Implantable device outside of the clinic excluding No cellular tissue based products placed in the center since last visit: Has Dressing in Place as Prescribed: Yes Pain Present Now: No Electronic Signature(s) Signed: 06/12/2021 5:07:02 PM By: Carlene Coria RN Entered By: Carlene Coria on 06/11/2021 15:24:17 Coryell, [name] ([id number]) -------------------------------------------------------------------------------- Clinic Level of Care Assessment Details Patient Name: [name], [name] Date of Service: 06/11/2021 3:00 PM Medical Record Number: [id number] Patient Account Number: [id number] Date of Birth/Sex: Jul 05, 1946 (75 y.o. M) Treating RN: Carlene Coria Primary Care Pasha Gadison: [name] Other Clinician: Referring Erik [name]: [name] Treating Eligha Kmetz/Extender: Yaakov Guthrie in  Treatment: 2 Clinic Level of Care Assessment Items TOOL 4 Quantity Score X - Use when only an EandM is performed on FOLLOW-UP visit 1 0 ASSESSMENTS - Nursing Assessment / Reassessment X - Reassessment of Co-morbidities (includes updates in patient status) 1 10 X- 1 5 Reassessment of Adherence to Treatment Plan ASSESSMENTS - Wound and Skin Assessment / Reassessment X - Simple Wound Assessment / Reassessment - one wound 1 5 []  - 0 Complex Wound Assessment / Reassessment - multiple wounds []  - 0 Dermatologic / Skin Assessment (not related to wound area) ASSESSMENTS - Focused Assessment []  - Circumferential Edema Measurements - multi extremities 0 []  - 0 Nutritional Assessment / Counseling / Intervention []  - 0 Lower Extremity Assessment (monofilament, tuning fork, pulses) []  - 0 Peripheral Arterial Disease Assessment (using hand held doppler) ASSESSMENTS - Ostomy and/or Continence Assessment and Care []  - Incontinence Assessment and Management 0 []  - 0 Ostomy Care Assessment and Management (repouching, etc.) PROCESS - Coordination of Care X - Simple Patient / Family Education for ongoing care 1 15 []  - 0 Complex (extensive) Patient / Family Education for ongoing care X- 1 10 Staff obtains Programmer, systems, Records, Test Results / Process Orders []  - 0 Staff telephones HHA, Nursing Homes / Clarify orders / etc []  - 0 Routine Transfer to another Facility (non-emergent condition) []  - 0 Routine Hospital Admission (non-emergent condition) []  - 0 New Admissions / Biomedical engineer / Ordering NPWT, Apligraf, etc. []  - 0 Emergency Hospital Admission (emergent condition) X- 1 10 Simple Discharge Coordination []  - 0 Complex (extensive) Discharge Coordination PROCESS - Special Needs []  - Pediatric / Minor Patient Management 0 []  - 0 Isolation Patient Management []  - 0 Hearing / Language / Visual special needs []  - 0 Assessment of Community assistance (transportation, D/C  planning, etc.) []  - 0 Additional assistance / Altered mentation []  - 0 Support Surface(s) Assessment (bed, cushion, seat, etc.) INTERVENTIONS - Wound Cleansing / Measurement [name], [name] ([id number]) X- 1 5 Simple Wound Cleansing -  one wound []  - 0 Complex Wound Cleansing - multiple wounds X- 1 5 Wound Imaging (photographs - any number of wounds) []  - 0 Wound Tracing (instead of photographs) X- 1 5 Simple Wound Measurement - one wound []  - 0 Complex Wound Measurement - multiple wounds INTERVENTIONS - Wound Dressings []  - Small Wound Dressing one or multiple wounds 0 []  - 0 Medium Wound Dressing one or multiple wounds []  - 0 Large Wound Dressing one or multiple wounds []  - 0 Application of Medications - topical []  - 0 Application of Medications - injection INTERVENTIONS - Miscellaneous []  - External ear exam 0 []  - 0 Specimen Collection (cultures, biopsies, blood, body fluids, etc.) []  - 0 Specimen(s) / Culture(s) sent or taken to Lab for analysis []  - 0 Patient Transfer (multiple staff / Civil Service fast streamer / Similar devices) []  - 0 Simple Staple / Suture removal (25 or less) []  - 0 Complex Staple / Suture removal (26 or more) []  - 0 Hypo / Hyperglycemic Management (close monitor of Blood Glucose) []  - 0 Ankle / Brachial Index (ABI) - do not check if billed separately X- 1 5 Vital Signs Has the patient been seen at the hospital within the last three years: Yes Total Score: 75 Level Of Care: New/Established - Level 2 Electronic Signature(s) Signed: 06/12/2021 5:07:02 PM By: Carlene Coria RN Entered By: Carlene Coria on 06/11/2021 16:13:14 [name], [name] ([id number]) -------------------------------------------------------------------------------- Encounter Discharge Information Details Patient Name: [name], [name] Date of Service: 06/11/2021 3:00 PM Medical Record Number: [id number] Patient Account Number: [id number] Date of Birth/Sex: 1946/01/01 (75  y.o. M) Treating RN: Carlene Coria Primary Care Kalina Morabito: [name] Other Clinician: Referring Noralee Dutko: [name] Treating Pat Sires/Extender: Yaakov Guthrie in Treatment: 2 Encounter Discharge Information Items Discharge Condition: Stable Ambulatory Status: Ambulatory Discharge Destination: Home Transportation: Private Auto Accompanied By: self Schedule Follow-up Appointment: Yes Clinical Summary of Care: Patient Declined Electronic Signature(s) Signed: 06/12/2021 5:07:02 PM By: Carlene Coria RN Entered By: Carlene Coria on 06/11/2021 16:15:03 Shinn, Mccade L. ([id number]) -------------------------------------------------------------------------------- Lower Extremity Assessment Details Patient Name: Rance, [name] Date of Service: 06/11/2021 3:00 PM Medical Record Number: [id number] Patient Account Number: [id number] Date of Birth/Sex: 12-12-1945 (75 y.o. M) Treating RN: Carlene Coria Primary Care Malayiah Mcbrayer: [name] Other Clinician: Referring Janiel Crisostomo: [name] Treating Xitlalli Newhard/Extender: Yaakov Guthrie in Treatment: 2 Edema Assessment Assessed: [Left: No] [Right: No] [Left: Edema] [Right: :] Calf Left: Right: Point of Measurement: 37 cm From Medial Instep 42 cm Ankle Left: Right: Point of Measurement: 11 cm From Medial Instep 27 cm Vascular Assessment Pulses: Dorsalis Pedis Palpable: [Right:Yes] Electronic Signature(s) Signed: 06/12/2021 5:07:02 PM By: Carlene Coria RN Entered By: Carlene Coria on 06/11/2021 16:11:38 Cozzens, Mercer L. ([id number]) -------------------------------------------------------------------------------- Multi Wound Chart Details Patient Name: Gundlach, [name] Date of Service: 06/11/2021 3:00 PM Medical Record Number: [id number] Patient Account Number: [id number] Date of Birth/Sex: 12-09-1945 (75 y.o. M) Treating RN: Carlene Coria Primary Care Gerrald Basu: [name] Other  Clinician: Referring Meisha Salone: [name] Treating Ajayla Iglesias/Extender: Yaakov Guthrie in Treatment: 2 Vital Signs Height(in): Pulse(bpm): 44 Weight(lbs): 198 Blood Pressure(mmHg): 144/69 Body Mass Index(BMI): Temperature(F): 97.9 Respiratory Rate(breaths/min): 18 Photos: [N/A:N/A] Wound Location: Right, Distal Lower Leg N/A N/A Wounding Event: Gradually Appeared N/A N/A Primary Etiology: Venous Leg Ulcer N/A N/A Comorbid History: Chronic Obstructive Pulmonary N/A N/A Disease (COPD) Date Acquired: 05/10/2021 N/A N/A Weeks of Treatment: 2 N/A N/A Wound Status: Open N/A N/A Measurements L x W x D (cm) 0x0x0 N/A N/A Area (cm) :  0 N/A N/A Volume (cm) : 0 N/A N/A % Reduction in Area: 100.00% N/A N/A % Reduction in Volume: 100.00% N/A N/A Classification: Full Thickness Without Exposed N/A N/A Support Structures Exudate Amount: None Present N/A N/A Granulation Amount: None Present (0%) N/A N/A Necrotic Amount: None Present (0%) N/A N/A Exposed Structures: Fascia: No N/A N/A Fat Layer (Subcutaneous Tissue): No Tendon: No Muscle: No Joint: No Bone: No Epithelialization: Large (67-100%) N/A N/A Treatment Notes Wound #1 (Lower Leg) Wound Laterality: Right, Distal Cleanser Peri-Wound Care Topical Primary Dressing Secondary Dressing Secured With Bedgood, [name] ([id number]) Compression Wrap Compression Stockings Add-Ons Electronic Signature(s) Signed: 06/11/2021 5:19:05 PM By: Kalman Shan DO Entered By: Kalman Shan on 06/11/2021 17:11:33 Hellums, [name] ([id number]) -------------------------------------------------------------------------------- [place] Details Patient Name: REAMY, [name] Date of Service: 06/11/2021 3:00 PM Medical Record Number: [id number] Patient Account Number: [id number] Date of Birth/Sex: 10-08-1945 (75 y.o. M) Treating RN: Carlene Coria Primary Care Aylissa Heinemann: [name] Other  Clinician: Referring Ashanta Amoroso: [name] Treating Vilas Edgerly/Extender: Yaakov Guthrie in Treatment: 2 Active Inactive Electronic Signature(s) Signed: 06/12/2021 5:07:02 PM By: Carlene Coria RN Entered By: Carlene Coria on 06/11/2021 16:11:55 Gafford, Birl L. ([id number]) -------------------------------------------------------------------------------- Pain Assessment Details Patient Name: Polzin, [name] Date of Service: 06/11/2021 3:00 PM Medical Record Number: [id number] Patient Account Number: [id number] Date of Birth/Sex: 10/30/1945 (75 y.o. M) Treating RN: Carlene Coria Primary Care Tniya Bowditch: [name] Other Clinician: Referring Ivery Michalski: [name] Treating Ladonya Jerkins/Extender: Yaakov Guthrie in Treatment: 2 Active Problems Location of Pain Severity and Description of Pain Patient Has Paino No Site Locations Pain Management and Medication Current Pain Management: Electronic Signature(s) Signed: 06/12/2021 5:07:02 PM By: Carlene Coria RN Entered By: Carlene Coria on 06/11/2021 15:24:47 Ilg, Qualyn L. ([id number]) -------------------------------------------------------------------------------- Patient/Caregiver Education Details Patient Name: DIBIASIO, [name] Date of Service: 06/11/2021 3:00 PM Medical Record Number: [id number] Patient Account Number: [id number] Date of Birth/Gender: 02/03/1946 (75 y.o. M) Treating RN: Carlene Coria Primary Care Physician: [name] Other Clinician: Referring Physician: [name] Treating Physician/Extender: Yaakov Guthrie in Treatment: 2 Education Assessment Education Provided To: Patient Education Topics Provided Wound/Skin Impairment: Methods: Explain/Verbal Responses: State content correctly Electronic Signature(s) Signed: 06/12/2021 5:07:02 PM By: Carlene Coria RN Entered By: Carlene Coria on 06/11/2021 16:13:36 Therrien, [name]  ([id number]) -------------------------------------------------------------------------------- Wound Assessment Details Patient Name: Vanwinkle, [name] Date of Service: 06/11/2021 3:00 PM Medical Record Number: [id number] Patient Account Number: [id number] Date of Birth/Sex: Nov 27, 1945 (75 y.o. M) Treating RN: Carlene Coria Primary Care Sidonie Dexheimer: [name] Other Clinician: Referring Juriel Cid: [name] Treating Aariah Godette/Extender: Yaakov Guthrie in Treatment: 2 Wound Status Wound Number: 1 Primary Etiology: Venous Leg Ulcer Wound Location: Right, Distal Lower Leg Wound Status: Open Wounding Event: Gradually Appeared Comorbid History: Chronic Obstructive Pulmonary Disease (COPD) Date Acquired: 05/10/2021 Weeks Of Treatment: 2 Clustered Wound: No Photos Wound Measurements Length: (cm) 0 Width: (cm) 0 Depth: (cm) 0 Area: (cm) 0 Volume: (cm) 0 % Reduction in Area: 100% % Reduction in Volume: 100% Epithelialization: Large (67-100%) Tunneling: No Undermining: No Wound Description Classification: Full Thickness Without Exposed Support Structure Exudate Amount: None Present s Foul Odor After Cleansing: No Slough/Fibrino No Wound Bed Granulation Amount: None Present (0%) Exposed Structure Necrotic Amount: None Present (0%) Fascia Exposed: No Fat Layer (Subcutaneous Tissue) Exposed: No Tendon Exposed: No Muscle Exposed: No Joint Exposed: No Bone Exposed: No Treatment Notes Wound #1 (Lower Leg) Wound Laterality: Right, Distal Cleanser Peri-Wound Care Topical Primary Dressing Crayton, [name] ([id number]) Secondary Dressing Secured  With Compression Wrap Compression Stockings Add-Ons Electronic Signature(s) Signed: 06/12/2021 5:07:02 PM By: Carlene Coria RN Entered By: Carlene Coria on 06/11/2021 16:11:07 Kope, [name] ([id number]) -------------------------------------------------------------------------------- Vitals  Details Patient Name: Turman, [name] Date of Service: 06/11/2021 3:00 PM Medical Record Number: [id number] Patient Account Number: [id number] Date of Birth/Sex: 24-Nov-1945 (75 y.o. M) Treating RN: Carlene Coria Primary Care Abdulah Iqbal: [name] Other Clinician: Referring Tyrion Glaude: [name] Treating Twylla Arceneaux/Extender: Yaakov Guthrie in Treatment: 2 Vital Signs Time Taken: 15:24 Temperature (F): 97.9 Weight (lbs): 198 Pulse (bpm): 94 Respiratory Rate (breaths/min): 18 Blood Pressure (mmHg): 144/69 Reference Range: 80 - 120 mg / dl Electronic Signature(s) Signed: 06/12/2021 5:07:02 PM By: Carlene Coria RN Entered By: Carlene Coria on 06/11/2021 15:24:40

## 2021-06-18 ENCOUNTER — Encounter (HOSPITAL_BASED_OUTPATIENT_CLINIC_OR_DEPARTMENT_OTHER): Payer: Medicare Other | Admitting: Internal Medicine

## 2021-06-18 ENCOUNTER — Other Ambulatory Visit: Payer: Self-pay

## 2021-06-18 DIAGNOSIS — I87311 Chronic venous hypertension (idiopathic) with ulcer of right lower extremity: Secondary | ICD-10-CM | POA: Diagnosis not present

## 2021-06-18 DIAGNOSIS — I890 Lymphedema, not elsewhere classified: Secondary | ICD-10-CM | POA: Diagnosis not present

## 2021-06-18 DIAGNOSIS — J449 Chronic obstructive pulmonary disease, unspecified: Secondary | ICD-10-CM | POA: Diagnosis not present

## 2021-06-20 NOTE — Progress Notes (Signed)
Kreutzer, [place] ([id number]) Visit Report for 06/18/2021 Arrival Information Details Patient Name: Erik [name], Erik [name]. Date of Service: 06/18/2021 3:00 PM Medical Record Number: [id number] Patient Account Number: [id number] Date of Birth/Sex: [birth year] (75 y.o. M) Treating RN: Carlene Coria Primary Care Saed Hudlow: [name] Other Clinician: Referring Cattie Tineo: [name] Treating Dyamon Sosinski/Extender: Yaakov Guthrie in Treatment: 3 Visit Information History Since Last Visit All ordered tests and consults were completed: No Patient Arrived: Ambulatory Added or deleted any medications: No Arrival Time: 15:25 Any new allergies or adverse reactions: No Accompanied By: daughter Had a fall or experienced change in No Transfer Assistance: None activities of daily living that may affect Patient Identification Verified: Yes risk of falls: Secondary Verification Process Completed: Yes Signs or symptoms of abuse/neglect since last visito No Patient Requires Transmission-Based Precautions: No Hospitalized since last visit: No Patient Has Alerts: No Implantable device outside of the clinic excluding No cellular tissue based products placed in the center since last visit: Has Dressing in Place as Prescribed: Yes Pain Present Now: No Electronic Signature(s) Signed: 06/20/2021 3:56:47 PM By: Carlene Coria RN Entered By: Carlene Coria on 06/18/2021 15:30:22 Schlageter, Raiquan L. ([id number]) -------------------------------------------------------------------------------- Clinic Level of Care Assessment Details Patient Name: [name], [name] Date of Service: 06/18/2021 3:00 PM Medical Record Number: [id number] Patient Account Number: [id number] Date of Birth/Sex: 10/13/1945 (75 y.o. M) Treating RN: Carlene Coria Primary Care Sanari Offner: [name] Other Clinician: Referring Alfred Eckley: [name] Treating Milly Goggins/Extender: Yaakov Guthrie in  Treatment: 3 Clinic Level of Care Assessment Items TOOL 4 Quantity Score X - Use when only an EandM is performed on FOLLOW-UP visit 1 0 ASSESSMENTS - Nursing Assessment / Reassessment X - Reassessment of Co-morbidities (includes updates in patient status) 1 10 X- 1 5 Reassessment of Adherence to Treatment Plan ASSESSMENTS - Wound and Skin Assessment / Reassessment X - Simple Wound Assessment / Reassessment - one wound 1 5 []  - 0 Complex Wound Assessment / Reassessment - multiple wounds []  - 0 Dermatologic / Skin Assessment (not related to wound area) ASSESSMENTS - Focused Assessment []  - Circumferential Edema Measurements - multi extremities 0 []  - 0 Nutritional Assessment / Counseling / Intervention []  - 0 Lower Extremity Assessment (monofilament, tuning fork, pulses) []  - 0 Peripheral Arterial Disease Assessment (using hand held doppler) ASSESSMENTS - Ostomy and/or Continence Assessment and Care []  - Incontinence Assessment and Management 0 []  - 0 Ostomy Care Assessment and Management (repouching, etc.) PROCESS - Coordination of Care X - Simple Patient / Family Education for ongoing care 1 15 []  - 0 Complex (extensive) Patient / Family Education for ongoing care []  - 0 Staff obtains Programmer, systems, Records, Test Results / Process Orders []  - 0 Staff telephones HHA, Nursing Homes / Clarify orders / etc []  - 0 Routine Transfer to another Facility (non-emergent condition) []  - 0 Routine Hospital Admission (non-emergent condition) []  - 0 New Admissions / Biomedical engineer / Ordering NPWT, Apligraf, etc. []  - 0 Emergency Hospital Admission (emergent condition) X- 1 10 Simple Discharge Coordination []  - 0 Complex (extensive) Discharge Coordination PROCESS - Special Needs []  - Pediatric / Minor Patient Management 0 []  - 0 Isolation Patient Management []  - 0 Hearing / Language / Visual special needs []  - 0 Assessment of Community assistance (transportation, D/C  planning, etc.) []  - 0 Additional assistance / Altered mentation []  - 0 Support Surface(s) Assessment (bed, cushion, seat, etc.) INTERVENTIONS - Wound Cleansing / Measurement Vickerman, Marquise L. ([id number]) X- 1 5 Simple Wound Cleansing -  one wound []  - 0 Complex Wound Cleansing - multiple wounds X- 1 5 Wound Imaging (photographs - any number of wounds) []  - 0 Wound Tracing (instead of photographs) X- 1 5 Simple Wound Measurement - one wound []  - 0 Complex Wound Measurement - multiple wounds INTERVENTIONS - Wound Dressings []  - Small Wound Dressing one or multiple wounds 0 []  - 0 Medium Wound Dressing one or multiple wounds []  - 0 Large Wound Dressing one or multiple wounds []  - 0 Application of Medications - topical []  - 0 Application of Medications - injection INTERVENTIONS - Miscellaneous []  - External ear exam 0 []  - 0 Specimen Collection (cultures, biopsies, blood, body fluids, etc.) []  - 0 Specimen(s) / Culture(s) sent or taken to Lab for analysis []  - 0 Patient Transfer (multiple staff / Civil Service fast streamer / Similar devices) []  - 0 Simple Staple / Suture removal (25 or less) []  - 0 Complex Staple / Suture removal (26 or more) []  - 0 Hypo / Hyperglycemic Management (close monitor of Blood Glucose) []  - 0 Ankle / Brachial Index (ABI) - do not check if billed separately X- 1 5 Vital Signs Has the patient been seen at the hospital within the last three years: Yes Total Score: 65 Level Of Care: New/Established - Level 2 Electronic Signature(s) Signed: 06/20/2021 3:56:47 PM By: Carlene Coria RN Entered By: Carlene Coria on 06/18/2021 15:52:40 Isaacks, Yoshiharu L. ([id number]) -------------------------------------------------------------------------------- Encounter Discharge Information Details Patient Name: [name], [name] Date of Service: 06/18/2021 3:00 PM Medical Record Number: [id number] Patient Account Number: [id number] Date of Birth/Sex: November 28, 1945 (75  y.o. M) Treating RN: Carlene Coria Primary Care Avriana Joo: [name] Other Clinician: Referring Fritz Cauthon: [name] Treating Akshita Italiano/Extender: Yaakov Guthrie in Treatment: 3 Encounter Discharge Information Items Discharge Condition: Stable Ambulatory Status: Ambulatory Discharge Destination: Home Transportation: Private Auto Accompanied By: daughter Schedule Follow-up Appointment: Yes Clinical Summary of Care: Patient Declined Electronic Signature(s) Signed: 06/18/2021 4:23:55 PM By: Carlene Coria RN Entered By: Carlene Coria on 06/18/2021 16:23:55 Latella, Husayn L. ([id number]) -------------------------------------------------------------------------------- Lower Extremity Assessment Details Patient Name: Doepke, Labarron L. Date of Service: 06/18/2021 3:00 PM Medical Record Number: [id number] Patient Account Number: [id number] Date of Birth/Sex: 08/21/1945 (75 y.o. M) Treating RN: Carlene Coria Primary Care Ismerai Bin: [name] Other Clinician: Referring Marylon Verno: [name] Treating Sabastien Tyler/Extender: Yaakov Guthrie in Treatment: 3 Edema Assessment Assessed: [Left: No] [Right: No] Edema: [Left: N] [Right: o] Calf Left: Right: Point of Measurement: 37 cm From Medial Instep 42.5 cm Ankle Left: Right: Point of Measurement: 11 cm From Medial Instep 26.7 cm Vascular Assessment Pulses: Dorsalis Pedis Palpable: [Right:Yes] Electronic Signature(s) Signed: 06/20/2021 3:56:47 PM By: Carlene Coria RN Entered By: Carlene Coria on 06/18/2021 15:32:28 Safer, Ezekeil L. ([id number]) -------------------------------------------------------------------------------- Multi Wound Chart Details Patient Name: Wetherell, Lantz L. Date of Service: 06/18/2021 3:00 PM Medical Record Number: [id number] Patient Account Number: [id number] Date of Birth/Sex: April 23, 1946 (75 y.o. M) Treating RN: Carlene Coria Primary Care Jevan Gaunt: [name] Other Clinician: Referring Rylyn Zawistowski: [name] Treating Larance Ratledge/Extender: Yaakov Guthrie in Treatment: 3 Vital Signs Height(in): Pulse(bpm): 1 Weight(lbs): 198 Blood Pressure(mmHg): 146/76 Body Mass Index(BMI): Temperature(F): 98.4 Respiratory Rate(breaths/min): 18 Photos: [N/A:N/A] Wound Location: Right, Distal Lower Leg N/A N/A Wounding Event: Gradually Appeared N/A N/A Primary Etiology: Venous Leg Ulcer N/A N/A Comorbid History: Chronic Obstructive Pulmonary N/A N/A Disease (COPD) Date Acquired: 05/10/2021 N/A N/A Weeks of Treatment: 3 N/A N/A Wound Status: Healed - Epithelialized N/A N/A Measurements L x W x D (cm) 0x0x0 N/A  N/A Area (cm) : 0 N/A N/A Volume (cm) : 0 N/A N/A % Reduction in Area: 100.00% N/A N/A % Reduction in Volume: 100.00% N/A N/A Classification: Full Thickness Without Exposed N/A N/A Support Structures Exudate Amount: None Present N/A N/A Granulation Amount: None Present (0%) N/A N/A Necrotic Amount: None Present (0%) N/A N/A Exposed Structures: Fascia: No N/A N/A Fat Layer (Subcutaneous Tissue): No Tendon: No Muscle: No Joint: No Bone: No Epithelialization: None N/A N/A Treatment Notes Wound #1 (Lower Leg) Wound Laterality: Right, Distal Cleanser Peri-Wound Care Topical Primary Dressing Secondary Dressing Secured With Kahl, Gustavus L. ([id number]) Compression Wrap Compression Stockings Add-Ons Electronic Signature(s) Signed: 06/18/2021 4:20:47 PM By: Kalman Shan DO Entered By: Kalman Shan on 06/18/2021 16:16:20 Cheslock, Viliami L. ([id number]) -------------------------------------------------------------------------------- Orange Details Patient Name: FOWLE, Vadhir L. Date of Service: 06/18/2021 3:00 PM Medical Record Number: [id number] Patient Account Number: [id number] Date of Birth/Sex: 09/29/1945 (75 y.o. M) Treating RN: Carlene Coria Primary Care Jakeim Sedore:  [name] Other Clinician: Referring Chastin Riesgo: [name] Treating Hilton Saephan/Extender: Yaakov Guthrie in Treatment: 3 Active Inactive Electronic Signature(s) Signed: 06/20/2021 3:56:47 PM By: Carlene Coria RN Entered By: Carlene Coria on 06/18/2021 15:40:27 Stefanick, Russ L. ([id number]) -------------------------------------------------------------------------------- Pain Assessment Details Patient Name: Rubens, Carla L. Date of Service: 06/18/2021 3:00 PM Medical Record Number: [id number] Patient Account Number: [id number] Date of Birth/Sex: 02-Jan-1946 (75 y.o. M) Treating RN: Carlene Coria Primary Care Lula Kolton: [name] Other Clinician: Referring Javen Hinderliter: [name] Treating Montie Gelardi/Extender: Yaakov Guthrie in Treatment: 3 Active Problems Location of Pain Severity and Description of Pain Patient Has Paino No Site Locations Pain Management and Medication Current Pain Management: Electronic Signature(s) Signed: 06/20/2021 3:56:47 PM By: Carlene Coria RN Entered By: Carlene Coria on 06/18/2021 15:30:57 Plyler, Skyler L. ([id number]) -------------------------------------------------------------------------------- Patient/Caregiver Education Details Patient Name: MCKOY, Charon L. Date of Service: 06/18/2021 3:00 PM Medical Record Number: [id number] Patient Account Number: [id number] Date of Birth/Gender: [birth year] (75 y.o. M) Treating RN: Carlene Coria Primary Care Physician: [name] Other Clinician: Referring Physician: [name] Treating Physician/Extender: Yaakov Guthrie in Treatment: 3 Education Assessment Education Provided To: Patient Education Topics Provided Wound/Skin Impairment: Methods: Explain/Verbal Responses: State content correctly Electronic Signature(s) Signed: 06/20/2021 3:56:47 PM By: Carlene Coria RN Entered By: Carlene Coria on 06/18/2021 15:53:18 Klemmer, Nahuel  L. ([id number]) -------------------------------------------------------------------------------- Wound Assessment Details Patient Name: Juul, Deontra L. Date of Service: 06/18/2021 3:00 PM Medical Record Number: [id number] Patient Account Number: [id number] Date of Birth/Sex: 1946-01-08 (75 y.o. M) Treating RN: Carlene Coria Primary Care Saket Hellstrom: [name] Other Clinician: Referring Naya Ilagan: [name] Treating Latronda Spink/Extender: Yaakov Guthrie in Treatment: 3 Wound Status Wound Number: 1 Primary Etiology: Venous Leg Ulcer Wound Location: Right, Distal Lower Leg Wound Status: Healed - Epithelialized Wounding Event: Gradually Appeared Comorbid History: Chronic Obstructive Pulmonary Disease (COPD) Date Acquired: 05/10/2021 Weeks Of Treatment: 3 Clustered Wound: No Photos Wound Measurements Length: (cm) 0 Width: (cm) 0 Depth: (cm) 0 Area: (cm) 0 Volume: (cm) 0 % Reduction in Area: 100% % Reduction in Volume: 100% Epithelialization: None Tunneling: No Undermining: No Wound Description Classification: Full Thickness Without Exposed Support Structure Exudate Amount: None Present s Foul Odor After Cleansing: No Slough/Fibrino No Wound Bed Granulation Amount: None Present (0%) Exposed Structure Necrotic Amount: None Present (0%) Fascia Exposed: No Fat Layer (Subcutaneous Tissue) Exposed: No Tendon Exposed: No Muscle Exposed: No Joint Exposed: No Bone Exposed: No Treatment Notes Wound #1 (Lower Leg) Wound Laterality: Right, Distal Cleanser Peri-Wound Care Topical Primary Dressing Patchen, Aarron L. (  [id number]) Secondary Dressing Secured With Compression Wrap Compression Stockings Add-Ons Electronic Signature(s) Signed: 06/20/2021 3:56:47 PM By: Carlene Coria RN Entered By: Carlene Coria on 06/18/2021 15:31:43 Fenech, Elgie L. ([id number]) -------------------------------------------------------------------------------- Vitals  Details Patient Name: Sindoni, Mizraim L. Date of Service: 06/18/2021 3:00 PM Medical Record Number: [id number] Patient Account Number: [id number] Date of Birth/Sex: 01-Mar-1946 (75 y.o. M) Treating RN: Carlene Coria Primary Care Cigi Bega: [name] Other Clinician: Referring Aleka Twitty: [name] Treating Tiara Bartoli/Extender: Yaakov Guthrie in Treatment: 3 Vital Signs Time Taken: 15:30 Temperature (F): 98.4 Weight (lbs): 198 Pulse (bpm): 87 Respiratory Rate (breaths/min): 18 Blood Pressure (mmHg): 146/76 Reference Range: 80 - 120 mg / dl Electronic Signature(s) Signed: 06/20/2021 3:56:47 PM By: Carlene Coria RN Entered By: Carlene Coria on 06/18/2021 15:30:48

## 2021-06-20 NOTE — Progress Notes (Signed)
Porras, Victoria ([id number]) Visit Report for 06/18/2021 Chief Complaint Document Details Patient Name: [name], [name] Date of Service: 06/18/2021 3:00 PM Medical Record Number: [id number] Patient Account Number: [id number] Date of Birth/Sex: [birth year] (75 y.o. M) Treating RN: Carlene Coria Primary Care Provider: [name] Other Clinician: Referring Provider: [name] Treating Provider/Extender: Yaakov Guthrie in Treatment: 3 Information Obtained from: Patient Chief Complaint Right lower extremity wound Electronic Signature(s) Signed: 06/18/2021 4:20:47 PM By: Kalman Shan DO Entered By: Kalman Shan on 06/18/2021 16:16:57 [name], [name] ([id number]) -------------------------------------------------------------------------------- HPI Details Patient Name: [name], [name] Date of Service: 06/18/2021 3:00 PM Medical Record Number: [id number] Patient Account Number: [id number] Date of Birth/Sex: 10/24/1945 (75 y.o. M) Treating RN: Carlene Coria Primary Care Provider: [name] Other Clinician: Referring Provider: [name] Treating Provider/Extender: Yaakov Guthrie in Treatment: 3 History of Present Illness HPI Description: Admission 05/28/2021 [name] is a 75 year old male with a past medical history of lymphedema, chronic venous insufficiency, COPD and LambertoEaton myasthenic syndrome that presents to the clinic for a 1 year history of skin changes with increased bilateral lower extremity edema right greater than left now with developing wounds to his right lower extremity. He was evaluated by podiatry on 10/11 who referred him to our clinic for this issue. At that time he was started on mupirocin and Keflex for probable cellulitis secondary to lymphedema. He currently denies signs of infection. He does not have compression stockings He is accompanied by his daughter who helps provide the  history as well. 10/26; patient presents for follow-up. He has been using silver alginate with dressing changes. The area is healed. He is not sure if he is received his compression stockings. His daughter who helps provide the history is not present today. He is not scheduled his ABIs with TBI's or venous reflux studies. He has no issues or complaints today. He denies signs of infection. 11/2; patient presents for follow-up. His wound remains closed. He received his juxta light compression and brought these in today. He would like for his daughter to be present To learn how to place these. She is not able to come in this week but can come in next week. 11/9; patient presents for follow-up. His wound remains closed. He has his juxta light compressions today. Daughter is present. He has no issues or complaints today. He denies signs of infection. Electronic Signature(s) Signed: 06/18/2021 4:20:47 PM By: Kalman Shan DO Entered By: Kalman Shan on 06/18/2021 16:17:22 [name], [name] ([id number]) -------------------------------------------------------------------------------- Physical Exam Details Patient Name: [name], [name] Date of Service: 06/18/2021 3:00 PM Medical Record Number: [id number] Patient Account Number: [id number] Date of Birth/Sex: 06-Jan-1946 (75 y.o. M) Treating RN: Carlene Coria Primary Care Provider: [name] Other Clinician: Referring Provider: [name] Treating Provider/Extender: Yaakov Guthrie in Treatment: 3 Constitutional . Cardiovascular . Psychiatric . Notes Chronic lymphedema skin changes bilaterally with nonpitting edema to the knees. No active open wounds present. No drainage noted. No obvious signs of infection. Electronic Signature(s) Signed: 06/18/2021 4:20:47 PM By: Kalman Shan DO Entered By: Kalman Shan on 06/18/2021 16:18:08 [name], [name]  ([id number]) -------------------------------------------------------------------------------- Physician Orders Details Patient Name: [name], [name] Date of Service: 06/18/2021 3:00 PM Medical Record Number: [id number] Patient Account Number: [id number] Date of Birth/Sex: [birth year] (75 y.o. M) Treating RN: Carlene Coria Primary Care Provider: [name] Other Clinician: Referring Provider: [name] Treating Provider/Extender: Yaakov Guthrie in Treatment: 3 Verbal / Phone Orders: No Diagnosis Coding Discharge From [place]  o Discharge from [place] Treatment Complete o Wear compression garments daily. Put garments on first thing when you wake up and remove them before bed. o Moisturize legs daily after removing compression garments. Electronic Signature(s) Signed: 06/18/2021 4:20:47 PM By: Kalman Shan DO Signed: 06/20/2021 3:56:47 PM By: Carlene Coria RN Entered By: Carlene Coria on 06/18/2021 15:41:21 [name], Erik ([id number]) -------------------------------------------------------------------------------- Problem List Details Patient Name: [name], [name] Date of Service: 06/18/2021 3:00 PM Medical Record Number: [id number] Patient Account Number: [id number] Date of Birth/Sex: January 30, 1946 (75 y.o. M) Treating RN: Carlene Coria Primary Care Provider: [name] Other Clinician: Referring Provider: [name] Treating Provider/Extender: Yaakov Guthrie in Treatment: 3 Active Problems ICD-10 Encounter Code Description Active Date MDM Diagnosis I87.311 Chronic venous hypertension (idiopathic) with ulcer of right lower 05/28/2021 No Yes extremity I89.0 Lymphedema, not elsewhere classified 05/28/2021 No Yes J44.9 Chronic obstructive pulmonary disease, unspecified 05/28/2021 No Yes Inactive Problems Resolved Problems Electronic Signature(s) Signed: 06/18/2021 4:20:47 PM By: Kalman Shan  DO Entered By: Kalman Shan on 06/18/2021 16:16:14 [name], Erik ([id number]) -------------------------------------------------------------------------------- Progress Note Details Patient Name: [name], [name] Date of Service: 06/18/2021 3:00 PM Medical Record Number: [id number] Patient Account Number: [id number] Date of Birth/Sex: June 17, 1946 (75 y.o. M) Treating RN: Carlene Coria Primary Care Provider: [name] Other Clinician: Referring Provider: [name] Treating Provider/Extender: Yaakov Guthrie in Treatment: 3 Subjective Chief Complaint Information obtained from Patient Right lower extremity wound History of Present Illness (HPI) Admission 05/28/2021 Mr. Babacar Haycraft is a 75 year old male with a past medical history of lymphedema, chronic venous insufficiency, COPD and Rita Ohara myasthenic syndrome that presents to the clinic for a 1 year history of skin changes with increased bilateral lower extremity edema right greater than left now with developing wounds to his right lower extremity. He was evaluated by podiatry on 10/11 who referred him to our clinic for this issue. At that time he was started on mupirocin and Keflex for probable cellulitis secondary to lymphedema. He currently denies signs of infection. He does not have compression stockings He is accompanied by his daughter who helps provide the history as well. 10/26; patient presents for follow-up. He has been using silver alginate with dressing changes. The area is healed. He is not sure if he is received his compression stockings. His daughter who helps provide the history is not present today. He is not scheduled his ABIs with TBI's or venous reflux studies. He has no issues or complaints today. He denies signs of infection. 11/2; patient presents for follow-up. His wound remains closed. He received his juxta light compression and brought these in today. He would like for  his daughter to be present To learn how to place these. She is not able to come in this week but can come in next week. 11/9; patient presents for follow-up. His wound remains closed. He has his juxta light compressions today. Daughter is present. He has no issues or complaints today. He denies signs of infection. Patient History Information obtained from Patient. Social History Former smoker, Marital Status - Separated, Alcohol Use - Never, Drug Use - No History, Caffeine Use - Daily. Medical History Respiratory Patient has history of Chronic Obstructive Pulmonary Disease (COPD) Objective Constitutional Vitals Time Taken: 3:30 PM, Weight: 198 lbs, Temperature: 98.4 F, Pulse: 87 bpm, Respiratory Rate: 18 breaths/min, Blood Pressure: 146/76 mmHg. General Notes: Chronic lymphedema skin changes bilaterally with nonpitting edema to the knees. No active open wounds present. No drainage noted. No obvious signs  of infection. Integumentary (Hair, Skin) Wound #1 status is Healed - Epithelialized. Original cause of wound was Gradually Appeared. The date acquired was: 05/10/2021. The wound has been in treatment 3 weeks. The wound is located on the Right,Distal Lower Leg. The wound measures 0cm length x 0cm width x 0cm depth; 0cm^2 area and 0cm^3 volume. There is no tunneling or undermining noted. There is a none present amount of drainage noted. There is no granulation within the wound bed. There is no necrotic tissue within the wound bed. [name], Erik ([id number]) Assessment Active Problems ICD-10 Chronic venous hypertension (idiopathic) with ulcer of right lower extremity Lymphedema, not elsewhere classified Chronic obstructive pulmonary disease, unspecified Patient presents for teaching on juxta light compression placement. Daughter is present. There are no open wounds.Patient knows to call with any questions or concerns. He can follow-up as needed. Plan Discharge From [facility]  Services: Discharge from [place] Treatment Complete Wear compression garments daily. Put garments on first thing when you wake up and remove them before bed. Moisturize legs daily after removing compression garments. 1. Juxta light compression daily 2. Follow-up as needed 3. Discharge from clinic due to closed wound Electronic Signature(s) Signed: 06/18/2021 4:20:47 PM By: Kalman Shan DO Entered By: Kalman Shan on 06/18/2021 16:19:39 Gude, [name] ([id number]) -------------------------------------------------------------------------------- ROS/PFSH Details Patient Name: [name], [name] Date of Service: 06/18/2021 3:00 PM Medical Record Number: [id number] Patient Account Number: [id number] Date of Birth/Sex: 1945-08-20 (75 y.o. M) Treating RN: Carlene Coria Primary Care Provider: [name] Other Clinician: Referring Provider: [name] Treating Provider/Extender: Yaakov Guthrie in Treatment: 3 Information Obtained From Patient Respiratory Medical History: Positive for: Chronic Obstructive Pulmonary Disease (COPD) Immunizations Pneumococcal Vaccine: Received Pneumococcal Vaccination: No Implantable Devices None Family and Social History Former smoker; Marital Status - Separated; Alcohol Use: Never; Drug Use: No History; Caffeine Use: Daily; Financial Concerns: No; Food, Clothing or Shelter Needs: No; Support System Lacking: No; Transportation Concerns: No Electronic Signature(s) Signed: 06/18/2021 4:20:47 PM By: Kalman Shan DO Signed: 06/20/2021 3:56:47 PM By: Carlene Coria RN Entered By: Kalman Shan on 06/18/2021 16:17:29 Rouse, Degan L. ([id number]) -------------------------------------------------------------------------------- SuperBill Details Patient Name: Burchill, Clemente L. Date of Service: 06/18/2021 Medical Record Number: [id number] Patient Account Number: [id number] Date of Birth/Sex: 07-02-1946  (75 y.o. M) Treating RN: Carlene Coria Primary Care Provider: [name] Other Clinician: Referring Provider: [name] Treating Provider/Extender: Yaakov Guthrie in Treatment: 3 Diagnosis Coding ICD-10 Codes Code Description I87.311 Chronic venous hypertension (idiopathic) with ulcer of right lower extremity J44.9 Chronic obstructive pulmonary disease, unspecified I89.0 Lymphedema, not elsewhere classified Facility Procedures CPT4 Code: 24401027 Description: [phone] - WOUND CARE VISIT-LEV 2 EST PT Modifier: Quantity: 1 Physician Procedures CPT4 Code: 4403474 Description: 25956 - WC PHYS LEVEL 3 - EST PT Modifier: Quantity: 1 CPT4 Code: Description: ICD-10 Diagnosis Description I87.311 Chronic venous hypertension (idiopathic) with ulcer of right lower ext J44.9 Chronic obstructive pulmonary disease, unspecified I89.0 Lymphedema, not elsewhere classified Modifier: remity Quantity: Electronic Signature(s) Signed: 06/18/2021 4:20:47 PM By: Kalman Shan DO Entered By: Kalman Shan on 06/18/2021 16:19:53

## 2021-06-25 ENCOUNTER — Encounter: Payer: Medicare Other | Admitting: Internal Medicine

## 2021-07-01 ENCOUNTER — Other Ambulatory Visit (INDEPENDENT_AMBULATORY_CARE_PROVIDER_SITE_OTHER): Payer: Self-pay | Admitting: Internal Medicine

## 2021-07-01 DIAGNOSIS — R6889 Other general symptoms and signs: Secondary | ICD-10-CM

## 2021-07-01 DIAGNOSIS — L97919 Non-pressure chronic ulcer of unspecified part of right lower leg with unspecified severity: Secondary | ICD-10-CM

## 2021-07-01 DIAGNOSIS — I87311 Chronic venous hypertension (idiopathic) with ulcer of right lower extremity: Secondary | ICD-10-CM

## 2021-07-02 ENCOUNTER — Encounter (INDEPENDENT_AMBULATORY_CARE_PROVIDER_SITE_OTHER): Payer: Self-pay | Admitting: Nurse Practitioner

## 2021-07-02 ENCOUNTER — Other Ambulatory Visit: Payer: Self-pay

## 2021-07-02 ENCOUNTER — Ambulatory Visit (INDEPENDENT_AMBULATORY_CARE_PROVIDER_SITE_OTHER): Payer: Medicare Other

## 2021-07-02 ENCOUNTER — Ambulatory Visit (INDEPENDENT_AMBULATORY_CARE_PROVIDER_SITE_OTHER): Payer: Medicare Other | Admitting: Nurse Practitioner

## 2021-07-02 ENCOUNTER — Encounter: Payer: Medicare Other | Admitting: Internal Medicine

## 2021-07-02 VITALS — BP 161/63 | HR 87 | Resp 16 | Ht 72.0 in | Wt 197.4 lb

## 2021-07-02 DIAGNOSIS — L97919 Non-pressure chronic ulcer of unspecified part of right lower leg with unspecified severity: Secondary | ICD-10-CM

## 2021-07-02 DIAGNOSIS — I739 Peripheral vascular disease, unspecified: Secondary | ICD-10-CM | POA: Diagnosis not present

## 2021-07-02 DIAGNOSIS — I87311 Chronic venous hypertension (idiopathic) with ulcer of right lower extremity: Secondary | ICD-10-CM | POA: Diagnosis not present

## 2021-07-02 DIAGNOSIS — M7989 Other specified soft tissue disorders: Secondary | ICD-10-CM

## 2021-07-02 DIAGNOSIS — R6889 Other general symptoms and signs: Secondary | ICD-10-CM

## 2021-07-02 DIAGNOSIS — Z23 Encounter for immunization: Secondary | ICD-10-CM | POA: Diagnosis not present

## 2021-07-07 ENCOUNTER — Encounter (INDEPENDENT_AMBULATORY_CARE_PROVIDER_SITE_OTHER): Payer: Self-pay | Admitting: Nurse Practitioner

## 2021-07-07 NOTE — Progress Notes (Signed)
Subjective:    Patient ID: Erik [name], male    DOB: 1946/01/29, 75 y.o.   MRN: [id number] Chief Complaint  Patient presents with   New Patient (Initial Visit)    Ref abi,le venous reflux,ulcer on rle    Erik [name] is a 75 year old male that presents today as a referral from Dr. Maren Beach due to concern for PAD and ulceration.  The patient reports that his right lower extremity ulceration is healed and he was released from the wound clinic.  He endorses having some minimal claudication-like symptoms however it is also noted that he does not walk significantly.  He also has some issues with lower extremity edema as well with the worse being on the right leg.  He notes that the swelling grew worse after the wound.  He has not worn any medical grade compression stockings.  He denies any rest pain like symptoms.  Today noninvasive studies show an ABI of 0.88 on the right and 0.81 on the left.  He has a TBI 0.40 on the right and 0.57 on the left.  He has monophasic waveforms bilaterally with slightly dampened toe waveforms bilaterally.  The patient and also had a right lower extremity venous reflux study done which shows no evidence of DVT or superficial thrombophlebitis.  No evidence of deep venous insufficiency or superficial venous reflux.  There is an enlarged lymph node in the right groin however this is consistent with recently healing wound.   Review of Systems  Cardiovascular:  Positive for leg swelling.  Skin:  Negative for wound.      Objective:   Physical Exam Vitals reviewed.  HENT:     Head: Normocephalic.  Cardiovascular:     Rate and Rhythm: Normal rate.     Comments: Nonpalpable pulses Pulmonary:     Effort: Pulmonary effort is normal.  Musculoskeletal:     Right lower leg: 2+ Edema present.     Left lower leg: 1+ Edema present.  Skin:    General: Skin is warm and dry.  Neurological:     Mental Status: He is alert and oriented to person, place, and time.   Psychiatric:        Mood and Affect: Mood normal.        Behavior: Behavior normal.        Thought Content: Thought content normal.        Judgment: Judgment normal.    BP (!) 161/63 (BP Location: Left Arm)   Pulse 87   Resp 16   Ht 6' (1.829 m)   Wt 197 lb 6.4 oz (89.5 kg)   BMI 26.77 kg/m   Past Medical History:  Diagnosis Date   B12 deficiency 06/25/2015   Chronic back pain    COPD (chronic obstructive pulmonary disease) (HCC)    DDD (degenerative disc disease), lumbar    GERD (gastroesophageal reflux disease)    Iron deficiency anemia 06/04/2016   LEMS (Lambert-Eaton myasthenic syndrome) ([place]) 06/25/2015   Low blood pressure    Microcytic anemia 06/25/2015   Seizures ([place])    in remote past    Shortness of breath dyspnea     Social History   Socioeconomic History   Marital status: Legally Separated    Spouse name: Not on file   Number of children: Not on file   Years of education: Not on file   Highest education level: Not on file  Occupational History   Not on file  Tobacco Use  Smoking status: Former    Packs/day: 0.50    Years: 21.00    Pack years: 10.50    Types: Cigarettes    Start date: 01/22/1963    Quit date: 08/11/1983    Years since quitting: 37.9   Smokeless tobacco: Never  Vaping Use   Vaping Use: Never used  Substance and Sexual Activity   Alcohol use: No    Alcohol/week: 0.0 standard drinks    Comment: remote past, history of ETOH abuse.    Drug use: No   Sexual activity: Not on file  Other Topics Concern   Not on file  Social History Narrative   Not on file   Social Determinants of Health   Financial Resource Strain: Not on file  Food Insecurity: Not on file  Transportation Needs: Not on file  Physical Activity: Not on file  Stress: Not on file  Social Connections: Not on file  Intimate Partner Violence: Not on file    Past Surgical History:  Procedure Laterality Date   COLONOSCOPY N/A 11/12/2014   Dr. Clayburn Pert  external and internal hemorrhoids, redundant left colon   ESOPHAGEAL DILATION N/A 11/12/2014   Procedure: ESOPHAGEAL DILATION;  Surgeon: Danie Binder, MD;  Location: [facility];  Service: Endoscopy;  Laterality: N/A;   ESOPHAGOGASTRODUODENOSCOPY N/A 11/12/2014   Dr. Fields:mild non-erosive gastritis. negative H.pylori    None      Family History  Problem Relation Age of Onset   Stomach cancer Maternal Grandfather    Sickle cell anemia Daughter    Colon cancer Neg Hx     No Known Allergies  CBC Latest Ref Rng & Units 08/09/2019 12/03/2017 06/04/2017  WBC 3.8 - 10.8 Thousand/uL 6.7 7.5 9.5  Hemoglobin 13.2 - 17.1 g/dL 11.8(L) 11.3(L) 11.4(L)  Hematocrit 38.5 - 50.0 % 38.1(L) 36.0(L) 35.7(L)  Platelets 140 - 400 Thousand/uL 254 230 292      CMP     Component Value Date/Time   NA 141 08/09/2019 0809   K 4.2 08/09/2019 0809   CL 104 08/09/2019 0809   CO2 28 08/09/2019 0809   GLUCOSE 94 08/09/2019 0809   BUN 17 08/09/2019 0809   CREATININE 1.22 (H) 08/09/2019 0809   CALCIUM 9.1 08/09/2019 0809   PROT 7.1 08/09/2019 0809   ALBUMIN 3.9 12/03/2017 1148   AST 18 08/09/2019 0809   ALT 10 08/09/2019 0809   ALKPHOS 75 12/03/2017 1148   BILITOT 0.3 08/09/2019 0809   GFRNONAA >60 12/03/2017 1148   GFRAA >60 12/03/2017 1148     No results found.     Assessment & Plan:   1. PAD (peripheral artery disease) ([place]) Had a long discussion with the patient and his daughter in regards to the results and peripheral arterial disease.  The patient does have mild evidence of PAD with little claudication.  The patient previously had an ulceration but that is currently healed.  Based on the patient's PAD, he is advised to contact us if he has worsening claudication of new wounds or ulcerations develop.  Otherwise we will maintain close follow-up in 6 months.   2. Leg swelling I have had a long discussion with the patient regarding swelling and why it  causes symptoms.  Patient will begin  wearing graduated compression stockings class 1 (20-30 mmHg) on a daily basis a prescription was given. The patient will  beginning wearing the stockings first thing in the morning and removing them in the evening. The patient is instructed specifically not to  sleep in the stockings.   In addition, behavioral modification will be initiated.  This will include frequent elevation, use of over the counter pain medications and exercise such as walking.  I have reviewed systemic causes for chronic edema such as liver, kidney and cardiac etiologies.  The patient denies problems with these organ systems.    Consideration for a lymph pump will also be made based upon the effectiveness of conservative therapy.  This would help to improve the edema control and prevent sequela such as ulcers and infections    Current Outpatient Medications on File Prior to Visit  Medication Sig Dispense Refill   acetaminophen (TYLENOL) 500 MG tablet Take 500 mg by mouth every 6 (six) hours as needed.     Amifampridine Phosphate 10 MG TABS Take 15 mg by mouth 3 (three) times daily.     aspirin EC 81 MG tablet Take 81 mg by mouth daily.     furosemide (LASIX) 40 MG tablet TAKE 1 TABLET BY MOUTH ON MONDAY, WEDNESDAY AND FRIDAY - MAY TAKE ADDITIONAL AS NEEDED FOR SWELLING. PT NEEDS APPT FOR FURTHER REFILLS 30 tablet 6   ketoconazole (NIZORAL) 2 % cream Apply 1 application topically daily. 60 g 2   midodrine (PROAMATINE) 5 MG tablet TAKE 1 TABLET BY MOUTH THREE TIMES DAILY WITH MEALS 270 tablet 3   mupirocin ointment (BACTROBAN) 2 % Apply 1 application topically 2 (two) times daily. 30 g 2   RUZURGI 10 MG TABS Take 10 mg by mouth 3 (three) times daily.     trolamine salicylate (ASPERCREME) 10 % cream Apply 1 application topically as needed for muscle pain.     vitamin B-12 (CYANOCOBALAMIN) 1000 MCG tablet Take 1,000 mcg by mouth daily.     Zinc 50 MG CAPS Take 50 mg by mouth every morning.     No current facility-administered  medications on file prior to visit.    There are no Patient Instructions on file for this visit. No follow-ups on file.   Kris Hartmann, NP

## 2021-07-09 ENCOUNTER — Encounter: Payer: Medicare Other | Admitting: Internal Medicine

## 2021-08-25 ENCOUNTER — Telehealth: Payer: Self-pay

## 2021-08-25 MED ORDER — FUROSEMIDE 40 MG PO TABS: ORAL_TABLET | ORAL | 6 refills | Status: DC

## 2021-08-25 NOTE — Telephone Encounter (Signed)
Medication refill request approved for Furosemide 40 mg tablets and sent to [facility] per pt request.

## 2021-09-19 ENCOUNTER — Telehealth: Payer: Self-pay | Admitting: Cardiology

## 2021-09-19 ENCOUNTER — Other Ambulatory Visit: Payer: Self-pay | Admitting: Cardiology

## 2021-09-19 MED ORDER — FUROSEMIDE 40 MG PO TABS: ORAL_TABLET | ORAL | 1 refills | Status: DC

## 2021-09-19 MED ORDER — MIDODRINE HCL 5 MG PO TABS: 5.0000 mg | ORAL_TABLET | Freq: Three times a day (TID) | ORAL | 1 refills | Status: DC

## 2021-09-19 NOTE — Telephone Encounter (Signed)
Done

## 2021-09-19 NOTE — Telephone Encounter (Signed)
°*  STAT* If patient is at the pharmacy, call can be transferred to refill team.   1. Which medications need to be refilled? (please list name of each medication and dose if known) midodrine (PROAMATINE) 5 MG tablet; furosemide (LASIX) 40 MG tablet  2. Which pharmacy/location (including street and city if local pharmacy) is medication to be sent to? [place], [place] [address]  3. Do they need a 30 day or 90 day supply? [place]

## 2022-01-01 ENCOUNTER — Ambulatory Visit (INDEPENDENT_AMBULATORY_CARE_PROVIDER_SITE_OTHER): Payer: Medicare Other | Admitting: Nurse Practitioner

## 2022-01-01 ENCOUNTER — Encounter (INDEPENDENT_AMBULATORY_CARE_PROVIDER_SITE_OTHER): Payer: Medicare Other

## 2022-01-01 ENCOUNTER — Encounter (INDEPENDENT_AMBULATORY_CARE_PROVIDER_SITE_OTHER): Payer: Self-pay

## 2022-01-19 ENCOUNTER — Encounter: Payer: Self-pay | Admitting: *Deleted

## 2022-01-19 ENCOUNTER — Encounter: Payer: Self-pay | Admitting: Cardiology

## 2022-01-19 ENCOUNTER — Ambulatory Visit (INDEPENDENT_AMBULATORY_CARE_PROVIDER_SITE_OTHER): Payer: Medicare Other | Admitting: Cardiology

## 2022-01-19 VITALS — BP 142/90 | HR 79 | Ht 72.0 in | Wt 208.0 lb

## 2022-01-19 DIAGNOSIS — R600 Localized edema: Secondary | ICD-10-CM | POA: Diagnosis not present

## 2022-01-19 DIAGNOSIS — I951 Orthostatic hypotension: Secondary | ICD-10-CM

## 2022-01-19 NOTE — Patient Instructions (Signed)

## 2022-01-19 NOTE — Progress Notes (Signed)
Clinical Summary Erik [name] is a 76 y.o.male seen today for follow up of the following medical problems.    1. Orthostatic hypotension -florinef stopped due to  issues with weight gain and edema. - has done well with just midodrine along with aggressive hydration and sodium intake   - no recent lightheadedness or dizziness - compliant with meds. Keeping hydration, increased sodium intake.  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 LE edema - takes prn lasix about once week, swelling is controlled.   4. PAD - followed by vacscular Past Medical History:  Diagnosis Date   B12 deficiency 06/25/2015   Chronic back pain    COPD (chronic obstructive pulmonary disease) (HCC)    DDD (degenerative disc disease), lumbar    GERD (gastroesophageal reflux disease)    Iron deficiency anemia 06/04/2016   LEMS (Lambert-Eaton myasthenic syndrome) ([place]) 06/25/2015   Low blood pressure    Microcytic anemia 06/25/2015   Seizures ([place])    in remote past    Shortness of breath dyspnea      No Known Allergies   Current Outpatient Medications  Medication Sig Dispense Refill   acetaminophen (TYLENOL) 500 MG tablet Take 500 mg by mouth every 6 (six) hours as needed.     Amifampridine Phosphate 10 MG TABS Take 15 mg by mouth 3 (three) times daily.     aspirin EC 81 MG tablet Take 81 mg by mouth daily.     furosemide (LASIX) 40 MG tablet TAKE 1 TABLET BY MOUTH ON MONDAY, WEDNESDAY AND FRIDAY - MAY TAKE ADDITIONAL AS NEEDED FOR SWELLING. PT NEEDS APPT FOR FURTHER REFILLS 60 tablet 1   ketoconazole (NIZORAL) 2 % cream Apply 1 application topically daily. 60 g 2   midodrine (PROAMATINE) 5 MG tablet Take 1 tablet (5 mg total) by mouth 3 (three) times daily with meals. 270 tablet 1   mupirocin ointment (BACTROBAN) 2 % Apply 1 application topically 2 (two) times daily. 30 g 2   RUZURGI 10 MG TABS Take 10 mg by mouth 3 (three) times  daily.     trolamine salicylate (ASPERCREME) 10 % cream Apply 1 application topically as needed for muscle pain.     vitamin B-12 (CYANOCOBALAMIN) 1000 MCG tablet Take 1,000 mcg by mouth daily.     Zinc 50 MG CAPS Take 50 mg by mouth every morning.     No current facility-administered medications for this visit.     Past Surgical History:  Procedure Laterality Date   COLONOSCOPY N/A 11/12/2014   Dr. Clayburn Pert external and internal hemorrhoids, redundant left colon   ESOPHAGEAL DILATION N/A 11/12/2014   Procedure: ESOPHAGEAL DILATION;  Surgeon: Danie Binder, MD;  [facility];  Service: Endoscopy;  Laterality: N/A;   ESOPHAGOGASTRODUODENOSCOPY N/A 11/12/2014   Dr. Fields:mild non-erosive gastritis. negative H.pylori    None       No Known Allergies    Family History  Problem Relation Age of Onset   Stomach cancer Maternal Grandfather    Sickle cell anemia Daughter    Colon cancer Neg Hx      Social History Erik [name] reports that he quit smoking about 38 years ago. His smoking use included cigarettes. He started smoking about 59 years ago. He has a 10.50 pack-year smoking history. He has never used smokeless tobacco. Erik [name] reports no history of alcohol use.   Review of  Systems CONSTITUTIONAL: No weight loss, fever, chills, weakness or fatigue.  HEENT: Eyes: No visual loss, blurred vision, double vision or yellow sclerae.No hearing loss, sneezing, congestion, runny nose or sore throat.  SKIN: No rash or itching.  CARDIOVASCULAR: per hpi RESPIRATORY: No shortness of breath, cough or sputum.  GASTROINTESTINAL: No anorexia, nausea, vomiting or diarrhea. No abdominal pain or blood.  GENITOURINARY: No burning on urination, no polyuria NEUROLOGICAL: No headache, dizziness, syncope, paralysis, ataxia, numbness or tingling in the extremities. No change in bowel or bladder control.  MUSCULOSKELETAL: No muscle, back pain, joint pain or stiffness.  LYMPHATICS:  No enlarged nodes. No history of splenectomy.  PSYCHIATRIC: No history of depression or anxiety.  ENDOCRINOLOGIC: No reports of sweating, cold or heat intolerance. No polyuria or polydipsia.  Marland Kitchen   Physical Examination Today's Vitals   01/19/22 1114  BP: (!) 142/90  Pulse: 79  SpO2: 95%  Weight: 208 lb (94.3 kg)  Height: 6' (1.829 m)   Body mass index is 28.21 kg/m.  Gen: resting comfortably, no acute distress HEENT: no scleral icterus, pupils equal round and reactive, no palptable cervical adenopathy,  CV: RRR, no mrg, no jvd Resp: Clear to auscultation bilaterally GI: abdomen is soft, non-tender, non-distended, normal bowel sounds, no hepatosplenomegaly MSK: extremities are warm, no edema.  Skin: warm, no rash Neuro:  no focal deficits Psych: appropriate affect   Diagnostic Studies 06/2014 echo Study Conclusions  - Left ventricle: The cavity size was normal. Wall thickness was   normal. Systolic function was normal. The estimated ejection   fraction was in the range of 55% to 60%. Wall motion was normal;   there were no regional wall motion abnormalities. Doppler   parameters are consistent with abnormal left ventricular   relaxation (grade 1 diastolic dysfunction). - Aortic valve: Trileaflet; mildly thickened leaflets. - Mitral valve: Mildly thickened leaflets . There was trivial   regurgitation. - Right atrium: Central venous pressure (est): 3 mm Hg. - Atrial septum: No defect or patent foramen ovale was identified. - Tricuspid valve: There was mild regurgitation. - Pulmonary arteries: PA peak pressure: 34 mm Hg (S). - Pericardium, extracardiac: There was no pericardial effusion.  Impressions:  - Normal LV wall thickness with LVEF 17-35%, grade 1 diastolic   dysfunction. Mildly thickened aortic valve. Trivial mitral and   mild regurgitation. PASP 34 mmHg.     Jan 2018 [facility] [place] No aneurysm    Assessment and Plan   1. Orthostatiic hypotension -- florinef  stopped due to significant edema and weight gain - has done very well for some time on midodrine, will continue     2. Leg edema - controlled with just prn lasix, really improved once florinef was stopped - he will continur prn lasix   Request pcp labs, f/u 1year     Arnoldo Lenis, M.D.,

## 2022-02-24 ENCOUNTER — Other Ambulatory Visit (INDEPENDENT_AMBULATORY_CARE_PROVIDER_SITE_OTHER): Payer: Self-pay | Admitting: Nurse Practitioner

## 2022-02-24 DIAGNOSIS — I739 Peripheral vascular disease, unspecified: Secondary | ICD-10-CM

## 2022-02-25 ENCOUNTER — Encounter (INDEPENDENT_AMBULATORY_CARE_PROVIDER_SITE_OTHER): Payer: Self-pay

## 2022-02-25 ENCOUNTER — Ambulatory Visit (INDEPENDENT_AMBULATORY_CARE_PROVIDER_SITE_OTHER): Payer: Medicare Other | Admitting: Nurse Practitioner

## 2022-02-25 ENCOUNTER — Ambulatory Visit (INDEPENDENT_AMBULATORY_CARE_PROVIDER_SITE_OTHER): Payer: Medicare Other

## 2022-02-25 DIAGNOSIS — I739 Peripheral vascular disease, unspecified: Secondary | ICD-10-CM

## 2022-03-05 ENCOUNTER — Encounter (INDEPENDENT_AMBULATORY_CARE_PROVIDER_SITE_OTHER): Payer: Self-pay | Admitting: *Deleted

## 2022-06-05 ENCOUNTER — Other Ambulatory Visit: Payer: Self-pay | Admitting: Cardiology

## 2022-06-17 DIAGNOSIS — G7080 Lambert-Eaton syndrome, unspecified: Secondary | ICD-10-CM | POA: Diagnosis not present

## 2022-07-31 ENCOUNTER — Other Ambulatory Visit: Payer: Self-pay | Admitting: Cardiology

## 2022-08-13 DIAGNOSIS — Z23 Encounter for immunization: Secondary | ICD-10-CM | POA: Diagnosis not present

## 2022-08-19 ENCOUNTER — Other Ambulatory Visit (INDEPENDENT_AMBULATORY_CARE_PROVIDER_SITE_OTHER): Payer: Self-pay | Admitting: Nurse Practitioner

## 2022-08-19 DIAGNOSIS — I739 Peripheral vascular disease, unspecified: Secondary | ICD-10-CM

## 2022-08-26 ENCOUNTER — Ambulatory Visit (INDEPENDENT_AMBULATORY_CARE_PROVIDER_SITE_OTHER): Payer: Medicare Other | Admitting: Nurse Practitioner

## 2022-08-26 ENCOUNTER — Encounter (INDEPENDENT_AMBULATORY_CARE_PROVIDER_SITE_OTHER): Payer: Medicare Other

## 2022-09-11 ENCOUNTER — Ambulatory Visit (INDEPENDENT_AMBULATORY_CARE_PROVIDER_SITE_OTHER): Payer: Medicare Other

## 2022-09-11 ENCOUNTER — Encounter (INDEPENDENT_AMBULATORY_CARE_PROVIDER_SITE_OTHER): Payer: Self-pay | Admitting: Nurse Practitioner

## 2022-09-11 ENCOUNTER — Ambulatory Visit (INDEPENDENT_AMBULATORY_CARE_PROVIDER_SITE_OTHER): Payer: Medicare Other | Admitting: Nurse Practitioner

## 2022-09-11 VITALS — BP 149/82 | HR 70 | Resp 16

## 2022-09-11 DIAGNOSIS — I739 Peripheral vascular disease, unspecified: Secondary | ICD-10-CM | POA: Diagnosis not present

## 2022-09-11 DIAGNOSIS — M7989 Other specified soft tissue disorders: Secondary | ICD-10-CM

## 2022-09-17 LAB — VAS US ABI WITH/WO TBI
Left ABI: 0.83
Right ABI: 0.98

## 2022-09-23 ENCOUNTER — Encounter (INDEPENDENT_AMBULATORY_CARE_PROVIDER_SITE_OTHER): Payer: Self-pay | Admitting: Nurse Practitioner

## 2022-09-23 NOTE — Progress Notes (Signed)
Subjective:    Patient ID: Erik [name], male    DOB: [birth year], 77 y.o.   MRN: [id number] Chief Complaint  Patient presents with   Follow-up    Ultrasound follow up    The patient returns to the office for followup and review of the noninvasive studies.   There have been no interval changes in lower extremity symptoms. No interval shortening of the patient's claudication distance or development of rest pain symptoms. No new ulcers or wounds have occurred since the last visit.  He continues to have lower extremity edema but it is improved from his previous follow-up.  There have been no significant changes to the patient's overall health care.  The patient denies amaurosis fugax or recent TIA symptoms. There are no documented recent neurological changes noted. There is no history of DVT, PE or superficial thrombophlebitis. The patient denies recent episodes of angina or shortness of breath.   ABI Rt=0.98 and Lt=0.83  (previous ABI's Rt=0.87 and Lt=0.76) Duplex ultrasound of the right lower extremity shows triphasic waveforms with monophasic in the left.  However the study was somewhat limited due to the gaiter skin present in the lower extremities.    Review of Systems  Cardiovascular:  Positive for leg swelling.  All other systems reviewed and are negative.      Objective:   Physical Exam Vitals reviewed.  HENT:     Head: Normocephalic.  Cardiovascular:     Rate and Rhythm: Normal rate.     Pulses:          Dorsalis pedis pulses are detected w/ Doppler on the right side and detected w/ Doppler on the left side.       Posterior tibial pulses are detected w/ Doppler on the right side and detected w/ Doppler on the left side.  Pulmonary:     Effort: Pulmonary effort is normal.  Skin:    General: Skin is warm and dry.  Neurological:     Mental Status: He is alert and oriented to person, place, and time.  Psychiatric:        Mood and Affect: Mood normal.         Behavior: Behavior normal.        Thought Content: Thought content normal.        Judgment: Judgment normal.     BP (!) 149/82 (BP Location: Left Arm)   Pulse 70   Resp 16   Past Medical History:  Diagnosis Date   B12 deficiency 06/25/2015   Chronic back pain    COPD (chronic obstructive pulmonary disease) (HCC)    DDD (degenerative disc disease), lumbar    GERD (gastroesophageal reflux disease)    Iron deficiency anemia 06/04/2016   LEMS (Lambert-Eaton myasthenic syndrome) ([place]) 06/25/2015   Low blood pressure    Microcytic anemia 06/25/2015   Seizures ([place])    in remote past    Shortness of breath dyspnea     Social History   Socioeconomic History   Marital status: Legally Separated    Spouse name: Not on file   Number of children: Not on file   Years of education: Not on file   Highest education level: Not on file  Occupational History   Not on file  Tobacco Use   Smoking status: Former    Packs/day: 0.50    Years: 21.00    Total pack years: 10.50    Types: Cigarettes    Start date: 01/22/1963    Quit  date: 08/11/1983    Years since quitting: 39.1   Smokeless tobacco: Never  Vaping Use   Vaping Use: Never used  Substance and Sexual Activity   Alcohol use: No    Alcohol/week: 0.0 standard drinks of alcohol    Comment: remote past, history of ETOH abuse.    Drug use: No   Sexual activity: Not on file  Other Topics Concern   Not on file  Social History Narrative   Not on file   Social Determinants of Health   Financial Resource Strain: Not on file  Food Insecurity: Not on file  Transportation Needs: Not on file  Physical Activity: Not on file  Stress: Not on file  Social Connections: Not on file  Intimate Partner Violence: Not on file    Past Surgical History:  Procedure Laterality Date   COLONOSCOPY N/A 11/12/2014   Dr. Clayburn Pert external and internal hemorrhoids, redundant left colon   ESOPHAGEAL DILATION N/A 11/12/2014   Procedure: ESOPHAGEAL  DILATION;  Surgeon: Danie Binder, MD;  [facility];  Service: Endoscopy;  Laterality: N/A;   ESOPHAGOGASTRODUODENOSCOPY N/A 11/12/2014   Dr. Fields:mild non-erosive gastritis. negative H.pylori    None      Family History  Problem Relation Age of Onset   Stomach cancer Maternal Grandfather    Sickle cell anemia Daughter    Colon cancer Neg Hx     No Known Allergies     Latest Ref Rng & Units 08/09/2019    8:09 AM 12/03/2017   11:48 AM 06/04/2017   10:36 AM  CBC  WBC 3.8 - 10.8 Thousand/uL 6.7  7.5  9.5   Hemoglobin 13.2 - 17.1 g/dL 11.8  11.3  11.4   Hematocrit 38.5 - 50.0 % 38.1  36.0  35.7   Platelets 140 - 400 Thousand/uL 254  230  292       CMP     Component Value Date/Time   NA 141 08/09/2019 0809   K 4.2 08/09/2019 0809   CL 104 08/09/2019 0809   CO2 28 08/09/2019 0809   GLUCOSE 94 08/09/2019 0809   BUN 17 08/09/2019 0809   CREATININE 1.22 (H) 08/09/2019 0809   CALCIUM 9.1 08/09/2019 0809   PROT 7.1 08/09/2019 0809   ALBUMIN 3.9 12/03/2017 1148   AST 18 08/09/2019 0809   ALT 10 08/09/2019 0809   ALKPHOS 75 12/03/2017 1148   BILITOT 0.3 08/09/2019 0809   GFRNONAA >60 12/03/2017 1148   GFRAA >60 12/03/2017 1148     VAS [place] ABI WITH/WO TBI  Result Date: 09/17/2022  LOWER EXTREMITY DOPPLER STUDY Patient Name:  Erik [name]  Date of Exam:   09/11/2022 Medical Rec #: [id number]            Accession #:    [id number] Date of Birth: August 15, 1945            Patient Gender: M Patient Age:   13 years Exam Location:  [facility] Procedure:      VAS [place] ABI WITH/WO TBI Referring Phys: Hortencia Pilar --------------------------------------------------------------------------------  Indications: Peripheral artery disease.  Comparison Study: 02/25/2022 Performing Technologist: Almira Coaster RVS  Examination Guidelines: A complete evaluation includes at minimum, Doppler waveform signals and systolic blood pressure reading at the level of bilateral  brachial, anterior tibial, and posterior tibial arteries, when vessel segments are accessible. Bilateral testing is considered an integral part of a complete examination. Photoelectric Plethysmograph (PPG) waveforms and toe systolic pressure readings are included  as required and additional duplex testing as needed. Limited examinations for reoccurring indications may be performed as noted.  ABI Findings: +---------+------------------+-----+---------+--------+ Right    Rt Pressure (mmHg)IndexWaveform Comment  +---------+------------------+-----+---------+--------+ Brachial 154                                      +---------+------------------+-----+---------+--------+ ATA      153               0.98 triphasic         +---------+------------------+-----+---------+--------+ Great Toe94                0.60 Abnormal          +---------+------------------+-----+---------+--------+ +---------+------------------+-----+----------+-------+ Left     Lt Pressure (mmHg)IndexWaveform  Comment +---------+------------------+-----+----------+-------+ Brachial 156                                      +---------+------------------+-----+----------+-------+ ATA      130               0.83 monophasic        +---------+------------------+-----+----------+-------+ Great Toe122               0.78 Normal            +---------+------------------+-----+----------+-------+ +-------+-----------+-----------+------------+------------+ ABI/TBIToday's ABIToday's TBIPrevious ABIPrevious TBI +-------+-----------+-----------+------------+------------+ Right  .98        .60        .87         .79          +-------+-----------+-----------+------------+------------+ Left   .83        .78        .76         .45          +-------+-----------+-----------+------------+------------+ Bilateral ABIs appear increased compared to prior study on 02/25/2022. Right TBIs appear decreased compared to  prior study on 02/25/2022. Lt TBIs appear to be increased compared to prior study on 02/25/2022.  Summary: Right: Resting right ankle-brachial index is within normal range. The right toe-brachial index is abnormal. Gaitor skin made it unsuccessful to insonate the PTA and Peroneal Artery. Left: Resting left ankle-brachial index indicates mild left lower extremity arterial disease. The left toe-brachial index is normal. Gaitor skin made it unsuccessful to insonate the PTA and Peroneal Artery. *See table(s) above for measurements and observations.   Electronically signed by Hortencia Pilar MD on 09/17/2022 at 8:54:12 AM.    Final        Assessment & Plan:   1. PAD (peripheral artery disease) (HCC)  Recommend:  The patient has evidence of atherosclerosis of the lower extremities with claudication.  The patient does not voice lifestyle limiting changes at this point in time.  Noninvasive studies do not suggest clinically significant change.  No invasive studies, angiography or surgery at this time The patient should continue walking and begin a more formal exercise program.  The patient should continue antiplatelet therapy and aggressive treatment of the lipid abnormalities  No changes in the patient's medications at this time  Continued surveillance is indicated as atherosclerosis is likely to progress with time.    The patient will continue follow up annually  2. Leg swelling Recommend:  No surgery or intervention at this point in time.    I have reviewed my previous discussion with the patient regarding swelling  and why it  causes symptoms.  The patient is doing well with compression and will continue wearing graduated compression on a daily basis. The patient will  continue wearing the compression first thing in the morning and removing them in the evening. The patient is instructed specifically not to sleep in the compression.    In addition, behavioral modification including elevation  during the day and exercise as tolerated will be continued.    Patient should follow-up on an annual basis    Current Outpatient Medications on File Prior to Visit  Medication Sig Dispense Refill   acetaminophen (TYLENOL) 500 MG tablet Take 500 mg by mouth every 6 (six) hours as needed.     Amifampridine Phosphate 10 MG TABS Take 15 mg by mouth 3 (three) times daily.     aspirin EC 81 MG tablet Take 81 mg by mouth daily.     Cholecalciferol (D3 5000) 125 MCG (5000 UT) capsule Take 5,000 Units by mouth daily.     midodrine (PROAMATINE) 5 MG tablet TAKE 1 TABLET BY MOUTH THREE TIMES DAILY WITH MEALS 270 tablet 1   trolamine salicylate (ASPERCREME) 10 % cream Apply 1 application topically as needed for muscle pain.     vitamin B-12 (CYANOCOBALAMIN) 1000 MCG tablet Take 1,000 mcg by mouth daily.     Zinc 50 MG CAPS Take 50 mg by mouth every morning.     furosemide (LASIX) 40 MG tablet TAKE 1 TABLET BY MOUTH ON MONDAY, WEDNESDAY AND FRIDAY - MAY TAKE ADDITIONAL AS NEEDED FOR SWELLING. PT NEEDS APPT FOR FURTHER REFILLS (Patient not taking: Reported on 09/11/2022) 60 tablet 3   ketoconazole (NIZORAL) 2 % cream Apply 1 application topically daily. (Patient not taking: Reported on 09/11/2022) 60 g 2   mupirocin ointment (BACTROBAN) 2 % Apply 1 application topically 2 (two) times daily. (Patient not taking: Reported on 09/11/2022) 30 g 2   RUZURGI 10 MG TABS Take 10 mg by mouth 3 (three) times daily. (Patient not taking: Reported on 09/11/2022)     No current facility-administered medications on file prior to visit.    There are no Patient Instructions on file for this visit. No follow-ups on file.   Kris Hartmann, NP

## 2022-10-26 ENCOUNTER — Ambulatory Visit: Payer: Medicare Other | Admitting: Internal Medicine

## 2022-10-27 ENCOUNTER — Encounter (HOSPITAL_COMMUNITY): Payer: Self-pay | Admitting: Oncology

## 2022-10-27 ENCOUNTER — Encounter: Payer: Self-pay | Admitting: Internal Medicine

## 2022-10-27 ENCOUNTER — Ambulatory Visit (INDEPENDENT_AMBULATORY_CARE_PROVIDER_SITE_OTHER): Payer: Medicare Other | Admitting: Internal Medicine

## 2022-10-27 VITALS — BP 144/66 | HR 91 | Resp 16 | Ht 74.0 in | Wt 204.0 lb

## 2022-10-27 DIAGNOSIS — E538 Deficiency of other specified B group vitamins: Secondary | ICD-10-CM | POA: Diagnosis not present

## 2022-10-27 DIAGNOSIS — R4189 Other symptoms and signs involving cognitive functions and awareness: Secondary | ICD-10-CM | POA: Diagnosis not present

## 2022-10-27 DIAGNOSIS — E559 Vitamin D deficiency, unspecified: Secondary | ICD-10-CM | POA: Diagnosis not present

## 2022-10-27 DIAGNOSIS — Z0001 Encounter for general adult medical examination with abnormal findings: Secondary | ICD-10-CM

## 2022-10-27 DIAGNOSIS — I739 Peripheral vascular disease, unspecified: Secondary | ICD-10-CM

## 2022-10-27 DIAGNOSIS — R7303 Prediabetes: Secondary | ICD-10-CM

## 2022-10-27 NOTE — Progress Notes (Unsigned)
HPI:[name] is a 77 y.o. male living with DDD of lumbar spine, B12 deficiency, Vitamin D deficiency, PAD,and Lambert-Eaton Myasthenic Syndrome(follows at Vianne Bulls, MD) who presents to establish care . His grandson mentions the family is concerned for dementia in patient before visit. Patient has no acute concerns. He converses well and gives a detailed account of his medical history. He currently lives at home by himself. His family members check on him regularly. No accidents have occurred. Independent in his ADL and most IADLs. Patient is guarded when discussing dementia. His grandson mentions family has noticed him forgetting things. Attempted to call grandson and daughter after visit but unable to reach them. I would like to understand more about their concerns and what they are noticing at home.   Past Medical History:  Diagnosis Date   B12 deficiency 06/25/2015   DDD (degenerative disc disease), lumbar    Iron deficiency anemia 06/04/2016   LEMS (Lambert-Eaton myasthenic syndrome) ([place]) 06/25/2015   Low blood pressure    Seizures ([place])    in remote past     Past Surgical History:  Procedure Laterality Date   COLONOSCOPY N/A 11/12/2014   Dr. Clayburn Pert external and internal hemorrhoids, redundant left colon   ESOPHAGEAL DILATION N/A 11/12/2014   Procedure: ESOPHAGEAL DILATION;  Surgeon: Danie Binder, MD;  [facility];  Service: Endoscopy;  Laterality: N/A;   ESOPHAGOGASTRODUODENOSCOPY N/A 11/12/2014   Dr. Fields:mild non-erosive gastritis. negative H.pylori    None      Family History  Problem Relation Age of Onset   Dementia Mother    Crohn's disease Mother    Cancer Father    Sickle cell anemia Daughter    Stomach cancer Maternal Grandfather    Colon cancer Neg Hx     Social History   Tobacco Use   Smoking status: Former    Packs/day: 0.50    Years: 21.00    Additional pack years: 0.00    Total pack years: 10.50    Types:  Cigarettes    Start date: 01/22/1963    Quit date: 08/11/1983    Years since quitting: 39.2   Smokeless tobacco: Never  Vaping Use   Vaping Use: Never used  Substance Use Topics   Alcohol use: No    Alcohol/week: 0.0 standard drinks of alcohol    Comment: remote past, history of ETOH abuse.    Drug use: No      Physical Exam: Vitals:   10/27/22 1605  BP: (!) 144/66  Pulse: 91  Resp: 16  SpO2: 91%  Weight: 204 lb (92.5 kg)  Height: 6\' 2"  (1.88 m)     Physical Exam Constitutional:      General: He is not in acute distress.    Appearance: He is not ill-appearing.  Cardiovascular:     Rate and Rhythm: Normal rate and regular rhythm.     Heart sounds: No murmur heard. Pulmonary:     Effort: Pulmonary effort is normal. No respiratory distress.     Breath sounds: No wheezing.  Psychiatric:        Attention and Perception: Attention normal.        Mood and Affect: Mood normal.        Behavior: Behavior normal.        Thought Content: Thought content normal.        Cognition and Memory: Memory is impaired. He does not exhibit impaired remote memory.  Assessment & Plan:   B12 deficiency Chronic problem Check B12  Vitamin D deficiency Chronic Problem Check Vitamin D level  Cognitive impairment Patient could not perform any task on MOCA. Outside of testing he converses normally and gives a detailed history. His mother and sister had dementia.  I will try again to contact patients grandson to get collateral information. Patient has follow up with neurologist and I ask for grandson to request evaluation from his neurologist.  Check B12 and TSH today  PAD (peripheral artery disease) ([place]) Follows with vascular surgery. On ASA 81mg  Check lipid panel   Prediabetes Check Hemoglobin A1c  Encounter for general adult medical examination with abnormal findings Establishing care today. History noted anemia. Obtain baseline CBC and CMP.     Lorene Dy, MD

## 2022-10-27 NOTE — Patient Instructions (Signed)
Thank you, [name] for allowing [place] to provide your care today.   I have ordered the following labs for you:   Lab Orders         CMP14+EGFR         Lipid panel         VITAMIN D 25 Hydroxy (Vit-D Deficiency, Fractures)         TSH         B12         CBC with Differential/Platelet         Hemoglobin A1c        Reminders: I will follow-up results of your lab work.     Tamsen Snider, M.D.

## 2022-10-28 ENCOUNTER — Encounter: Payer: Self-pay | Admitting: Internal Medicine

## 2022-10-28 ENCOUNTER — Telehealth: Payer: Self-pay | Admitting: Internal Medicine

## 2022-10-28 DIAGNOSIS — Z0001 Encounter for general adult medical examination with abnormal findings: Secondary | ICD-10-CM | POA: Insufficient documentation

## 2022-10-28 DIAGNOSIS — E559 Vitamin D deficiency, unspecified: Secondary | ICD-10-CM | POA: Insufficient documentation

## 2022-10-28 DIAGNOSIS — I739 Peripheral vascular disease, unspecified: Secondary | ICD-10-CM | POA: Insufficient documentation

## 2022-10-28 DIAGNOSIS — R7303 Prediabetes: Secondary | ICD-10-CM | POA: Insufficient documentation

## 2022-10-28 DIAGNOSIS — R4189 Other symptoms and signs involving cognitive functions and awareness: Secondary | ICD-10-CM | POA: Insufficient documentation

## 2022-10-28 DIAGNOSIS — E119 Type 2 diabetes mellitus without complications: Secondary | ICD-10-CM | POA: Insufficient documentation

## 2022-10-28 NOTE — Assessment & Plan Note (Signed)
Patient could not perform any task on MOCA. Outside of testing he converses normally and gives a detailed history. His mother and sister had dementia.  I will try again to contact patients grandson to get collateral information. Patient has follow up with neurologist and I ask for grandson to request evaluation from his neurologist.  Check B12 and TSH today

## 2022-10-28 NOTE — Assessment & Plan Note (Addendum)
Follows with vascular surgery. On ASA 81mg  Check lipid panel

## 2022-10-28 NOTE — Assessment & Plan Note (Signed)
Chronic Problem Check Vitamin D level

## 2022-10-28 NOTE — Assessment & Plan Note (Signed)
Establishing care today. History noted anemia. Obtain baseline CBC and CMP.

## 2022-10-28 NOTE — Assessment & Plan Note (Signed)
Check Hemoglobin A1c

## 2022-10-28 NOTE — Assessment & Plan Note (Signed)
Chronic problem Check B12

## 2022-10-28 NOTE — Telephone Encounter (Signed)
Patient son Darris returning Dr Court Joy call on lab results. Call back # [phone]

## 2022-10-29 ENCOUNTER — Telehealth: Payer: Self-pay | Admitting: Internal Medicine

## 2022-10-29 NOTE — Telephone Encounter (Signed)
I spoke to patient grandson and patients family has noticed a decline over the year. The patient lives next door to his daughter. He moved in to their home and has not gone back to his own house. He repeats himself a lot. They believe he is showing signs of dementia similar to what they saw in patients mother. Patient is in a safe environment at this time. I will check into resources we can provide.

## 2022-11-06 DIAGNOSIS — Z0001 Encounter for general adult medical examination with abnormal findings: Secondary | ICD-10-CM | POA: Diagnosis not present

## 2022-11-06 DIAGNOSIS — E538 Deficiency of other specified B group vitamins: Secondary | ICD-10-CM | POA: Diagnosis not present

## 2022-11-06 DIAGNOSIS — E559 Vitamin D deficiency, unspecified: Secondary | ICD-10-CM | POA: Diagnosis not present

## 2022-11-06 DIAGNOSIS — R7303 Prediabetes: Secondary | ICD-10-CM | POA: Diagnosis not present

## 2022-11-06 DIAGNOSIS — E785 Hyperlipidemia, unspecified: Secondary | ICD-10-CM | POA: Diagnosis not present

## 2022-11-07 LAB — LIPID PANEL
Chol/HDL Ratio: 5.3 ratio — ABNORMAL HIGH (ref 0.0–5.0)
Cholesterol, Total: 179 mg/dL (ref 100–199)
HDL: 34 mg/dL — ABNORMAL LOW (ref >39)
LDL Chol Calc (NIH): 114 mg/dL — ABNORMAL HIGH (ref 0–99)
Triglycerides: 173 mg/dL — ABNORMAL HIGH (ref 0–149)
VLDL Cholesterol Cal: 31 mg/dL (ref 5–40)

## 2022-11-07 LAB — CMP14+EGFR
ALT: 14 IU/L (ref 0–44)
AST: 18 IU/L (ref 0–40)
Albumin/Globulin Ratio: 1.5 (ref 1.2–2.2)
Albumin: 4.3 g/dL (ref 3.8–4.8)
Alkaline Phosphatase: 70 IU/L (ref 44–121)
BUN/Creatinine Ratio: 10 (ref 10–24)
BUN: 12 mg/dL (ref 8–27)
Bilirubin Total: 0.7 mg/dL (ref 0.0–1.2)
CO2: 26 mmol/L (ref 20–29)
Calcium: 9.4 mg/dL (ref 8.6–10.2)
Chloride: 98 mmol/L (ref 96–106)
Creatinine, Ser: 1.24 mg/dL (ref 0.76–1.27)
Globulin, Total: 2.9 g/dL (ref 1.5–4.5)
Glucose: 99 mg/dL (ref 70–99)
Potassium: 3.9 mmol/L (ref 3.5–5.2)
Sodium: 138 mmol/L (ref 134–144)
Total Protein: 7.2 g/dL (ref 6.0–8.5)
eGFR: 60 mL/min/{1.73_m2} (ref >59)

## 2022-11-07 LAB — VITAMIN B12: Vitamin B-12: >2000 pg/mL — ABNORMAL HIGH (ref 232–1245)

## 2022-11-07 LAB — TSH: TSH: 0.51 u[IU]/mL (ref 0.450–4.500)

## 2022-11-07 LAB — VITAMIN D 25 HYDROXY (VIT D DEFICIENCY, FRACTURES): Vit D, 25-Hydroxy: 79.5 ng/mL (ref 30.0–100.0)

## 2022-11-07 LAB — HEMOGLOBIN A1C
Est. average glucose Bld gHb Est-mCnc: 131 mg/dL
Hgb A1c MFr Bld: 6.2 % — ABNORMAL HIGH (ref 4.8–5.6)

## 2022-11-09 ENCOUNTER — Other Ambulatory Visit: Payer: Self-pay | Admitting: Internal Medicine

## 2022-11-09 ENCOUNTER — Telehealth: Payer: Self-pay | Admitting: Internal Medicine

## 2022-11-09 DIAGNOSIS — R4189 Other symptoms and signs involving cognitive functions and awareness: Secondary | ICD-10-CM

## 2022-11-09 DIAGNOSIS — I739 Peripheral vascular disease, unspecified: Secondary | ICD-10-CM

## 2022-11-09 DIAGNOSIS — E782 Mixed hyperlipidemia: Secondary | ICD-10-CM

## 2022-11-09 MED ORDER — ATORVASTATIN CALCIUM 40 MG PO TABS: 40.0000 mg | ORAL_TABLET | Freq: Every day | ORAL | 3 refills | Status: DC

## 2022-11-09 NOTE — Progress Notes (Signed)
Referral placed to Social Work.

## 2022-11-09 NOTE — Telephone Encounter (Signed)
Spoke to patient and updated him on recent labs. He will stop Vitamin D and B12 supplement. He has PAD and LDL 114. Will send atorvastatin to pharmacy. I left voicemail for patients grandson. They have concern for memory changes they have seen at home. Patient remembered our conversation from our last visit.

## 2022-11-18 ENCOUNTER — Encounter: Payer: Self-pay | Admitting: Internal Medicine

## 2022-11-18 ENCOUNTER — Other Ambulatory Visit: Payer: Self-pay | Admitting: Internal Medicine

## 2022-11-18 DIAGNOSIS — I739 Peripheral vascular disease, unspecified: Secondary | ICD-10-CM

## 2022-11-18 DIAGNOSIS — D508 Other iron deficiency anemias: Secondary | ICD-10-CM

## 2022-11-18 DIAGNOSIS — R4189 Other symptoms and signs involving cognitive functions and awareness: Secondary | ICD-10-CM

## 2022-11-24 ENCOUNTER — Telehealth: Payer: Self-pay

## 2022-11-24 NOTE — Progress Notes (Signed)
  Chronic Care Management   Note  11/24/2022 Name: [name] MRN: [id number] DOB: Apr 04, 1946  Erik [name] is a 77 y.o. year old male who is a primary care patient of [name], MD. I reached out to Erik [name] by phone today in response to a referral sent by Erik [name] PCP.  The first contact attempt was unsuccessful.   Follow up plan: Additional outreach attempts will be made.  Penne Lash, RMA Care Guide [facility]  [place], [place] [address] Direct Dial: [phone] [email or web address]

## 2022-11-30 NOTE — Progress Notes (Signed)
  Chronic Care Management   Note  11/30/2022 Name: Erik [name] MRN: [id number] DOB: October 15, 1945  Erik [name] is a 77 y.o. year old male who is a primary care patient of [name], MD. I reached out to Erik [name] by phone today in response to a referral sent by [name] PCP.  [name] was given information about Chronic Care Management services today including:  CCM service includes personalized support from designated clinical staff supervised by the physician, including individualized plan of care and coordination with other care providers 24/7 contact phone numbers for assistance for urgent and routine care needs. Service will only be billed when office clinical staff spend 20 minutes or more in a month to coordinate care. Only one practitioner may furnish and bill the service in a calendar month. The patient may stop CCM services at amy time (effective at the end of the month) by phone call to the office staff. The patient will be responsible for cost sharing (co-pay) or up to 20% of the service fee (after annual deductible is met)  [name]  agreedto scheduling an appointment with the CCM RN Case Manager   Follow up plan: Patient agreed to scheduled appointment with RN Case Manager on 12/11/2022(date/time).   Penne Lash, RMA Care Guide [facility]  [place], [place] [address] Direct Dial: [phone] [name]

## 2022-12-01 ENCOUNTER — Other Ambulatory Visit: Payer: Self-pay | Admitting: Cardiology

## 2022-12-03 ENCOUNTER — Ambulatory Visit: Payer: Self-pay | Admitting: *Deleted

## 2022-12-03 NOTE — Patient Outreach (Signed)
  Care Coordination   12/03/2022  Name: Erik [name] MRN: [id number] DOB: [birth year]   Care Coordination Outreach Attempts:  An unsuccessful telephone outreach was attempted today to offer the patient information about available care coordination services as a benefit of their health plan. HIPAA compliant messages left on voicemail for patient and patient's daughter, [name] providing contact information for CSW, encouraging them to return CSW's call at their earliest convenience.  Follow Up Plan:  Additional outreach attempts will be made to offer the patient care coordination information and services.   Encounter Outcome:  No Answer.   Care Coordination Interventions:  No, not indicated.    Danford Bad, BSW, MSW, LCSW  Licensed Restaurant manager, fast food [facility]  Mailing [place] [address] [place], [place] [address] Physical Address-[address], [place], [place] [address] Toll Free Main # [phone] Fax # [phone] Cell # [phone] [email or web address]

## 2022-12-04 ENCOUNTER — Encounter: Payer: Medicare Other | Admitting: *Deleted

## 2022-12-04 ENCOUNTER — Telehealth: Payer: Self-pay | Admitting: *Deleted

## 2022-12-04 NOTE — Progress Notes (Unsigned)
  Care Coordination Note  12/04/2022 Name: NYZAIAH KAI MRN: [id number] DOB: [birth year]  [name] is a 77 y.o. year old male who is a primary care patient of [name], MD and is actively engaged with the care management team. I reached out to [name] by phone today to assist with re-scheduling an initial visit with the Licensed Clinical Social Worker  Follow up plan: Unsuccessful telephone outreach attempt made. A HIPAA compliant phone message was left for the patient providing contact information and requesting a return call.   [facility]  Care Coordination Care Guide  Direct Dial: [phone]

## 2022-12-09 NOTE — Progress Notes (Signed)
  Care Coordination Note  12/09/2022 Name: [name] MRN: [id number] DOB: 1946-03-11  Erik [name] is a 77 y.o. year old male who is a primary care patient of [name], MD and is actively engaged with the care management team. I reached out to Erik [name] by phone today to assist with re-scheduling an initial visit with the Licensed Clinical Social Worker  Follow up plan: Telephone appointment with care management team member scheduled for:12/14/22  [facility]  Care Coordination Care Guide  Direct Dial: [phone]

## 2022-12-10 ENCOUNTER — Encounter: Payer: Self-pay | Admitting: *Deleted

## 2022-12-10 ENCOUNTER — Ambulatory Visit: Payer: Self-pay | Admitting: *Deleted

## 2022-12-10 NOTE — Patient Outreach (Signed)
Care Coordination   Initial Visit Note   12/10/2022  Name: KEWAN MCNEASE MRN: [id number] DOB: Aug 19, 1945  [name] is a 77 y.o. year old male who sees [name], [name], MD for primary care. I spoke with patient's daughter, [name] by phone today.  What matters to the patients health and wellness today?  Receive Assistance Applying for Personal Care Services.    Goals Addressed             This Visit's Progress    Receive Assistance Applying for Personal Care Services.   On track    Care Coordination Interventions:  Interventions Today    Flowsheet Row Most Recent Value  Chronic Disease   Chronic disease during today's visit Other  [Inability to Perform Activities of Daily Living Independently, Prediabetes, Cognitive Impairment & Pharyngoesophageal Phase Dysphagia]  General Interventions   General Interventions Discussed/Reviewed General Interventions Discussed, Labs, Vaccines, Doctor Visits, Health Screening, General Interventions Reviewed, Annual Eye Exam, Durable Medical Equipment (DME), Community Resources, Level of Care, Communication with  [Encouraged]  Labs Hgb A1c annually  [Encouraged]  Vaccines COVID-19, Flu, Pneumonia, RSV, Shingles, Tetanus/Pertussis/Diphtheria  [Encouraged]  Doctor Visits Discussed/Reviewed Doctor Visits Discussed, Specialist, Doctor Visits Reviewed, Annual Wellness Visits, PCP  [Encouraged]  Health Screening Bone Density, Colonoscopy, Prostate  [Encouraged]  PCP/Specialist Visits Compliance with follow-up visit  [Encouraged]  Communication with PCP/Specialists, RN  Level of Care Adult Daycare, Personal Care Services, Applications, Assisted Living, Skilled Nursing Facility  [Encouraged]  Applications Personal Care Services  [Encouraged]  Exercise Interventions   Exercise Discussed/Reviewed Exercise Discussed, Assistive device use and maintanence, Exercise Reviewed, Physical Activity  [Encouraged]  Physical Activity  Discussed/Reviewed Physical Activity Discussed, Home Exercise Program (HEP), Physical Activity Reviewed, Types of exercise  [Encouraged]  Education Interventions   Education Provided Provided Therapist, sports, Provided Web-based Education, Provided Education  Provided Verbal Education On Development worker, community, [employer], When to see the doctor, Mental Health/Coping with Illness, Nutrition, Eye Care, Labs, Applications, Exercise, Medication  [Encouraged]  Applications Personal Care Services  [Encouraged]  Mental Health Interventions   Mental Health Discussed/Reviewed Mental Health Discussed, Anxiety, Depression, Grief and Loss, Substance Abuse, Suicide, Other, Crisis, Coping Strategies, Mental Health Reviewed  [Domestic Violence]  Nutrition Interventions   Nutrition Discussed/Reviewed Nutrition Discussed, Adding fruits and vegetables, Increaing proteins, Decreasing fats, Decreasing salt, Nutrition Reviewed, Carbohydrate meal planning, Decreasing sugar intake, Supplmental nutrition, Portion sizes, Fluid intake  [Encouraged]  Pharmacy Interventions   Pharmacy Dicussed/Reviewed Pharmacy Topics Discussed, Pharmacy Topics Reviewed, Medication Adherence, Affording Medications  [Encouraged]  Safety Interventions   Safety Discussed/Reviewed Safety Discussed, Safety Reviewed, Fall Risk, Home Safety  [Encouraged]  Home Safety Assistive Devices, Need for home safety assessment, Refer for community resources  [Encouraged]  Advanced Directive Interventions   Advanced Directives Discussed/Reviewed Advanced Directives Discussed  [Encouraged]     Assessed Social Determinant of Health Barriers. Discussed Plans for Ongoing Care Management Follow Up. Provided Careers information officer Information for Care Management Team Members. Screened for Signs & Symptoms of Depression, Related to Chronic Disease State.  PHQ2 & PHQ9 Depression Screen Completed & Results Reviewed.  Suicidal Ideation & Homicidal Ideation Assessed -  None Present.   Domestic Violence Assessed - None Present. Access to Weapons Assessed - None Present.   Active Listening & Reflection Utilized.  Verbalization of Feelings Encouraged.  Emotional Support Provided. Feelings of Caregiver Burnout Validated. Caregiver Stress Acknowledged. Caregiver Resources Reviewed. Caregiver Support Groups Mailed. Self-Enrollment in Caregiver Support Group of Interest Emphasized. Crisis Support Information, Agencies,  Services & Resources Discussed. Problem Solving Interventions Identified. Task-Centered Solutions Implemented.   Solution-Focused Strategies Developed. Acceptance & Commitment Therapy Introduced. Brief Cognitive Behavioral Therapy Initiated. Client-Centered Therapy Enacted. Reviewed Prescription Medications & Discussed Importance of Compliance. Quality of Sleep Assessed & Sleep Hygiene Techniques Promoted. CSW Collaboration with Daughter, [name] to Discuss Higher Level of Care Options (I.e. Memory Care Assisted Living Facility, Extended Care Facility, Skilled Nursing Facility) & Encouraged Consideration. CSW Collaboration with Daughter, [name] to Verify No Long-Term Care Insurance Benefits, [employer], Plans, Coverage, Etc.  CSW Collaboration with Daughter, [name] to Confirm Neither Patient, Nor Wife Were Veterans, Making Them Ineligible to Apply for Aid & Attendance Benefits, Through [employer]. CSW Collaboration with Daughter, [name] to Review Insurance Provider Benefits through [employer] & Medicaid, Explaining Patient's Eligibility to Apply for Personal Care Services, through [employer] [phone]). CSW Collaboration with Daughter, [name] to Request Review of The Following List of [employer], [employer], [employer], Mailed on 12/10/2022: ~ Adult Day Care Programs  ~ In-Home Care & Respite Agencies ~ Home Health Care Agencies ~ Respite Care  Agencies & Facilities ~ Personal Care Services Application  ~ Theatre manager Providers CSW Collaboration with Daughter, Scientist, research (medical) to Control and instrumentation engineer with [employer], Services & Resources of Interest, in An Effort to Obtain 24 Hour Care & Supervision for Patient in The Home. CSW Collaboration with Daughter, [name] to Encourage Completion of Application for Personal Care Services & Submission to Primary Care Provider, Dr. Delmar Landau with [facility] [phone]), for Review & Signature. CSW Collaboration with Daughter, [name] to [employer] with CSW [phone]# [phone]), if She Has Questions, Needs Assistance, or If Additional Social Work Needs Are Identified Between Now & Our Scheduled Scheduled Follow-Up Outreach Call.        SDOH assessments and interventions completed:  Yes.  SDOH Interventions Today    Flowsheet Row Most Recent Value  SDOH Interventions   Food Insecurity Interventions Intervention Not Indicated  [Verified by Daughter, [name]]  Housing Interventions Intervention Not Indicated  [Verified by Daughter, [name]]  Transportation Interventions Intervention Not Indicated, Patient Resources (Friends/Family), Payor Benefit  [Verified by Daughter, [name]]  Utilities Interventions Intervention Not Indicated  [Verified by Daughter, [name]]  Alcohol Usage Interventions Intervention Not Indicated (Score <7)  [Verified by Daughter, [name]]  Financial Strain Interventions Intervention Not Indicated  [Verified by Daughter, [name]]  Physical Activity Interventions Intervention Not Indicated, Patient Refused  [Verified by Daughter, [name]]  Stress Interventions Intervention Not Indicated  [Verified by Daughter, [name]]  Social Connections Interventions Intervention Not Indicated  [Verified by Daughter, [name]]     Care Coordination  Interventions:  Yes, provided.   Follow up plan: Follow up call scheduled for 12/23/2022 at 9:00 am.   Encounter Outcome:  Pt. Visit Completed.   Danford Bad, BSW, MSW, LCSW  Licensed Restaurant manager, fast food [facility]  Mailing [place] [address] [place], [place] [address] Physical Address-[address] [place], [place] [address] Toll Free Main # [phone] Fax # [phone] Cell # [phone] [email or web address]

## 2022-12-10 NOTE — Patient Instructions (Signed)
Visit Information  Thank you for taking time to visit with me today. Please don't hesitate to contact me if I can be of assistance to you.   Following are the goals we discussed today:   Goals Addressed             This Visit's Progress    Receive Assistance Applying for Personal Care Services.   On track    Care Coordination Interventions:  Interventions Today    Flowsheet Row Most Recent Value  Chronic Disease   Chronic disease during today's visit Other  [Inability to Perform Activities of Daily Living Independently, Prediabetes, Cognitive Impairment & Pharyngoesophageal Phase Dysphagia]  General Interventions   General Interventions Discussed/Reviewed General Interventions Discussed, Labs, Vaccines, Doctor Visits, Health Screening, General Interventions Reviewed, Annual Eye Exam, Durable Medical Equipment (DME), Community Resources, Level of Care, Communication with  [Encouraged]  Labs Hgb A1c annually  [Encouraged]  Vaccines COVID-19, Flu, Pneumonia, RSV, Shingles, Tetanus/Pertussis/Diphtheria  [Encouraged]  Doctor Visits Discussed/Reviewed Doctor Visits Discussed, Specialist, Doctor Visits Reviewed, Annual Wellness Visits, PCP  [Encouraged]  Health Screening Bone Density, Colonoscopy, Prostate  [Encouraged]  PCP/Specialist Visits Compliance with follow-up visit  [Encouraged]  Communication with PCP/Specialists, RN  Level of Care Adult Daycare, Personal Care Services, Applications, Assisted Living, Skilled Nursing Facility  [Encouraged]  Applications Personal Care Services  [Encouraged]  Exercise Interventions   Exercise Discussed/Reviewed Exercise Discussed, Assistive device use and maintanence, Exercise Reviewed, Physical Activity  [Encouraged]  Physical Activity Discussed/Reviewed Physical Activity Discussed, Home Exercise Program (HEP), Physical Activity Reviewed, Types of exercise  [Encouraged]  Education Interventions   Education Provided Provided Therapist, sports,  Provided Web-based Education, Provided Education  Provided Verbal Education On Development worker, community, [employer], When to see the doctor, Mental Health/Coping with Illness, Nutrition, Eye Care, Labs, Applications, Exercise, Medication  [Encouraged]  Applications Personal Care Services  [Encouraged]  Mental Health Interventions   Mental Health Discussed/Reviewed Mental Health Discussed, Anxiety, Depression, Grief and Loss, Substance Abuse, Suicide, Other, Crisis, Coping Strategies, Mental Health Reviewed  [Domestic Violence]  Nutrition Interventions   Nutrition Discussed/Reviewed Nutrition Discussed, Adding fruits and vegetables, Increaing proteins, Decreasing fats, Decreasing salt, Nutrition Reviewed, Carbohydrate meal planning, Decreasing sugar intake, Supplmental nutrition, Portion sizes, Fluid intake  [Encouraged]  Pharmacy Interventions   Pharmacy Dicussed/Reviewed Pharmacy Topics Discussed, Pharmacy Topics Reviewed, Medication Adherence, Affording Medications  [Encouraged]  Safety Interventions   Safety Discussed/Reviewed Safety Discussed, Safety Reviewed, Fall Risk, Home Safety  [Encouraged]  Home Safety Assistive Devices, Need for home safety assessment, Refer for community resources  [Encouraged]  Advanced Directive Interventions   Advanced Directives Discussed/Reviewed Advanced Directives Discussed  [Encouraged]     Assessed Social Determinant of Health Barriers. Discussed Plans for Ongoing Care Management Follow Up. Provided Careers information officer Information for Care Management Team Members. Screened for Signs & Symptoms of Depression, Related to Chronic Disease State.  PHQ2 & PHQ9 Depression Screen Completed & Results Reviewed.  Suicidal Ideation & Homicidal Ideation Assessed - None Present.   Domestic Violence Assessed - None Present. Access to Weapons Assessed - None Present.   Active Listening & Reflection Utilized.  Verbalization of Feelings Encouraged.  Emotional Support  Provided. Feelings of Caregiver Burnout Validated. Caregiver Stress Acknowledged. Caregiver Resources Reviewed. Caregiver Support Groups Mailed. Self-Enrollment in Caregiver Support Group of Interest Emphasized. Crisis Support Information, Agencies, Services & Resources Discussed. Problem Solving Interventions Identified. Task-Centered Solutions Implemented.   Solution-Focused Strategies Developed. Acceptance & Commitment Therapy Introduced. Brief Cognitive Behavioral Therapy Initiated. Client-Centered Therapy Enacted. Reviewed Prescription Medications &  Discussed Importance of Compliance. Quality of Sleep Assessed & Sleep Hygiene Techniques Promoted. CSW Collaboration with Daughter, [name] to Discuss Higher Level of Care Options (I.e. Memory Care Assisted Living Facility, Extended Care Facility, Skilled Nursing Facility) & Encouraged Consideration. CSW Collaboration with Daughter, [name] to Verify No Long-Term Care Insurance Benefits, [employer], Plans, Coverage, Etc.  CSW Collaboration with Daughter, [name] to Confirm Neither Patient, Nor Wife Were Veterans, Making Them Ineligible to Apply for Aid & Attendance Benefits, Through [employer]. CSW Collaboration with Daughter, [name] to Review Insurance Provider Benefits through [employer] & Medicaid, Explaining Patient's Eligibility to Apply for Personal Care Services, through [employer] [phone]). CSW Collaboration with Daughter, [name] to Request Review of The Following List of [employer], [employer], [employer], Mailed on 12/10/2022: ~ Adult Day Care Programs  ~ In-Home Care & Respite Agencies ~ Home Health Care Agencies ~ Respite Care Agencies & Facilities ~ Personal Care Services Application  ~ Theatre manager Providers CSW Collaboration with Daughter, Scientist, research (medical) to Control and instrumentation engineer with [employer],  Services & Resources of Interest, in An Effort to Obtain 24 Hour Care & Supervision for Patient in The Home. CSW Collaboration with Daughter, [name] to Encourage Completion of Application for Personal Care Services & Submission to Primary Care Provider, Dr. Delmar Landau with [facility] [phone]), for Review & Signature. CSW Collaboration with Daughter, [name] to [employer] with CSW [phone]# [phone]), if She Has Questions, Needs Assistance, or If Additional Social Work Needs Are Identified Between Now & Our Scheduled Scheduled Follow-Up Outreach Call.      Our next appointment is by telephone on 12/23/2022 at 9:00 am.   Please call the care guide team at [phone] if you need to cancel or reschedule your appointment.   If you are experiencing a Mental Health or Behavioral Health Crisis or need someone to talk to, please call the Suicide and Crisis Lifeline: 988 call the [place] National Suicide Prevention Lifeline: (367)241-0921 or TTY: 513-112-1209 TTY (437)522-0893) to talk to a trained counselor call 1-800-273-TALK (toll free, 24 hour hotline) go to [facility] [address] [place] [phone]) call the [facility] Crisis Line: 865-888-2806 call 911  Patient verbalizes understanding of instructions and care plan provided today and agrees to view in MyChart. Active MyChart status and patient understanding of how to access instructions and care plan via MyChart confirmed with patient.     Telephone follow up appointment with care management team member scheduled for:  12/23/2022 at 9:00 am.   Danford Bad, BSW, MSW, LCSW  Licensed Clinical Social Worker  [employer] [facility]  Mailing [place] [place], [place] [address] Physical Address-[address], [place], [place] [address] Toll Free Main # [phone] Fax #  [phone] Cell # [phone] [email or web address]

## 2022-12-11 ENCOUNTER — Ambulatory Visit: Payer: Medicare Other | Admitting: *Deleted

## 2022-12-11 DIAGNOSIS — I739 Peripheral vascular disease, unspecified: Secondary | ICD-10-CM

## 2022-12-11 DIAGNOSIS — D508 Other iron deficiency anemias: Secondary | ICD-10-CM

## 2022-12-11 DIAGNOSIS — R4189 Other symptoms and signs involving cognitive functions and awareness: Secondary | ICD-10-CM

## 2022-12-11 NOTE — Patient Instructions (Signed)
Please call the care guide team at [phone] if you need to cancel or reschedule your appointment.   If you are experiencing a Mental Health or Behavioral Health Crisis or need someone to talk to, please call the Suicide and Crisis Lifeline: 988 call the [place] National Suicide Prevention Lifeline: 6475127669 or TTY: 6714051353 TTY 724-575-7020) to talk to a trained counselor call 1-800-273-TALK (toll free, 24 hour hotline) go to [facility] [address] [place] [id number]) call the [facility]: [phone] call 911   Following is a copy of the CCM Program Consent:  CCM service includes personalized support from designated clinical staff supervised by the physician, including individualized plan of care and coordination with other care providers 24/7 contact phone numbers for assistance for urgent and routine care needs. Service will only be billed when office clinical staff spend 20 minutes or more in a month to coordinate care. Only one practitioner may furnish and bill the service in a calendar month. The patient may stop CCM services at amy time (effective at the end of the month) by phone call to the office staff. The patient will be responsible for cost sharing (co-pay) or up to 20% of the service fee (after annual deductible is met)  Following is a copy of your full provider care plan:   Goals Addressed             This Visit's Progress    CCM (COGNITIVE IMPAIRMENT) EXPECTED OUTCOME: MONITOR, SELF-MANAGE AND REDUCE SYMPTOMS OF COGNITIVE IMPAIRMENT       Current Barriers:  Knowledge Deficits related to Cognitive Impairment Chronic Disease Management support and education needs related to Cognitive Impairment Cognitive Deficits- LCSW is working with patient/ daughter Patient lives alone and daughter feels he is safe at home with oversight provided by family  Planned Interventions: Reviewed medications  including importance of taking as prescribed, Discussed importance of discussing diagnosis with provider, Discussed importance of attendance to all provider appointments, and Advised to contact provider for new or worsening symptoms  Symptom Management: Take medications as prescribed   Attend all scheduled provider appointments Call pharmacy for medication refills 3-7 days in advance of running out of medications Attend church or other social activities Call provider office for new concerns or questions  call the Suicide and Crisis Lifeline: 988 call the [place] National Suicide Prevention Lifeline: 719-571-4602 or TTY: 509-474-9722 TTY 616-227-3185) to talk to a trained counselor call 1-800-273-TALK (toll free, 24 hour hotline) go to [facility] [address], [place] [phone]) call the [facility] Crisis Line: [phone] call 911 if experiencing a Mental Health or Behavioral Health Crisis   Follow Up Plan: Telephone follow up appointment with care management team member scheduled for:  03/08/23 at 130 pm       CCM (IRON DEFICIENCY ANEMIA) EXPECTED OUTCOME: MONITOR, SELF-MANAGE AND REDUCE SYMPTOMS OF IRON DEFICIENCY ANEMIA       Current Barriers:  Knowledge Deficits related to Iron Deficiency Anemia management Chronic Disease Management support and education needs related to Iron Deficiency Anemia Cognitive Deficits No Advanced Directives in place  Planned Interventions: Evaluation of current treatment plan related to Iron Deficiency Anemia and patient's adherence to plan as established by provider Provided education to patient re: Iron Deficiency Anemia Advised patient to discuss change in health status/ symptoms with provider Assessed social determinant of health barriers Reviewed all upcoming scheduled appointments Reviewed importance of regular laboratory monitoring  Symptom Management: Take medications as  prescribed   Attend  all scheduled provider appointments Call pharmacy for medication refills 3-7 days in advance of running out of medications Attend church or other social activities Call provider office for new concerns or questions  call the Suicide and Crisis Lifeline: 988 call the [place] National Suicide Prevention Lifeline: (667)254-7966 or TTY: (858)128-7293 TTY 6473743272) to talk to a trained counselor call 1-800-273-TALK (toll free, 24 hour hotline) go to [facility] [address], [place] [phone]) call the [facility] Crisis Line: [phone] call 911 if experiencing a Mental Health or Behavioral Health Crisis   Follow Up Plan: Telephone follow up appointment with care management team member scheduled for: 03/08/23 at 130 pm       CCM (PERIPHERAL ARTERY DISEASE) EXPECTED OUTCOME: MONITOR, SELF-MANAGE AND REDUCE SYMPTOMS OF PERIPHERAL ARTERY DISEASE       Current Barriers:  Knowledge Deficits related to Peripheral Artery Disease Care Coordination needs related to social work in a patient with Peripheral Artery Disease Chronic Disease Management support and education needs related to Peripheral Artery Disease Cognitive Deficits No Advanced Directives in place-daughter requests documents be mailed Spoke with daughter [name] who reports pt lives alone and she lives nearby, she and other family members see pt daily, speak with him on the phone and provide oversight as needed, cook meals for him, provide transportation.  Patient has cognitive impairment, forgetfulness, varies day to day, repeats same questions.  Per daughter, pt does not wear compression hose but is supposed to.  Planned Interventions: Evaluation of current treatment plan related to Peripheral Artery Disease and patient's adherence to plan as established by provider Advised patient to wear compression hose per doctor's order Provided education to patient and/or caregiver about  advanced directives Reviewed medications with patient and discussed importance of taking as prescribed Assessed social determinant of health barriers Encouraged daughter to give pt reminders and assist to apply compression hose Advanced directives packet mailed  Symptom Management: Take medications as prescribed   Attend all scheduled provider appointments Call pharmacy for medication refills 3-7 days in advance of running out of medications Attend church or other social activities Call provider office for new concerns or questions  Work with the social worker to address care coordination needs and will continue to work with the clinical team to address health care and disease management related needs Encourage pt to wear compression daily and assist with application if needed Advanced directives packet mailed  Follow Up Plan: Telephone follow up appointment with care management team member scheduled for:  03/08/23 at 130 pm          Patient verbalizes understanding of instructions and care plan provided today and agrees to view in MyChart. Active MyChart status and patient understanding of how to access instructions and care plan via MyChart confirmed with patient.  Telephone follow up appointment with care management team member scheduled for:   03/08/23 at 130 pm  Iron Deficiency Anemia, Adult  Iron deficiency anemia is a condition in which the concentration of red blood cells or hemoglobin in the blood is below normal because of too little iron. Hemoglobin is a substance in red blood cells that carries oxygen to the body's tissues. When the concentration of red blood cells or hemoglobin is too low, not enough oxygen reaches these tissues. Iron deficiency anemia is usually long-lasting, and it develops over time. It may or may not cause symptoms. It is a common type of anemia. What are the causes? This  condition may be caused by: Not enough iron in the diet. Abnormal absorption in  the gut. Blood loss. What increases the risk? You are more likely to develop this condition if you get menstrual periods (menstruate) or are pregnant. What are the signs or symptoms? Symptoms of this condition may include: Pale skin, lips, and nail beds. Weakness, dizziness, and getting tired easily. Shortness of breath when moving or exercising. Cold hands or feet. Mild anemia may not cause any symptoms. How is this diagnosed? This condition is diagnosed based on: Your medical history. A physical exam. Blood tests. How is this treated? This condition is treated by correcting the cause of your iron deficiency. Treatment may involve: Adding iron-rich foods to your diet. Taking iron supplements. If you are pregnant or breastfeeding, you may need to take extra iron because your normal diet usually does not provide the amount of iron that you need. Increasing vitamin C intake. Vitamin C helps your body absorb iron. Your health care provider may recommend that you take iron supplements along with a glass of orange juice or a vitamin C supplement. Medicines to make heavy menstrual flow lighter. Surgery or additional testing procedures to determine the cause of your anemia. You may need repeat blood tests to determine whether treatment is working. If the treatment does not seem to be working, you may need more tests. Follow these instructions at home: Medicines Take over-the-counter and prescription medicines only as told by your health care provider. This includes iron supplements and vitamins. This is important because too much iron can be harmful. For the best iron absorption, you should take iron supplements when your stomach is empty. If you cannot tolerate them on an empty stomach, you may need to take them with food. Do not drink milk or take antacids at the same time as your iron supplements. Milk and antacids may interfere with how your body absorbs iron. Iron supplements may turn  stool (feces) a darker color and it may appear black. If you cannot tolerate taking iron supplements by mouth, talk with your health care provider about taking them through an IV or through an injection into a muscle. Eating and drinking Talk with your health care provider before changing your diet. Your provider may recommend that you eat foods that contain a lot of iron, such as: Liver. Low-fat (lean) beef. Breads and cereals that have iron added to them (are fortified). Eggs. Dried fruit. Dark green, leafy vegetables. To help your body use the iron from iron-rich foods, eat those foods at the same time as fresh fruits and vegetables that are high in vitamin C. Foods that are high in vitamin C include: Oranges. Peppers. Tomatoes. Mangoes. Managing constipation If you are taking an iron supplement, it may cause constipation. To prevent or treat constipation, you may need to: Drink enough fluid to keep your urine pale yellow. Take over-the-counter or prescription medicines. Eat foods that are high in fiber, such as beans, whole grains, and fresh fruits and vegetables. Limit foods that are high in fat and processed sugars, such as fried or sweet foods. General instructions Return to your normal activities as told by your health care provider. Ask your health care provider what activities are safe for you. Keep all follow-up visits. Contact a health care provider if: You feel nauseous or you vomit. You feel weak. You become light-headed when getting up from a sitting or lying down position. You have unexplained sweating. You develop symptoms of constipation. You have a  heaviness in your chest. You have trouble breathing with physical activity. Get help right away if: You faint. If this happens, do not drive yourself to the hospital. You have an irregular or rapid heartbeat. Summary Iron deficiency anemia is a condition in which the concentration of red blood cells or hemoglobin in  the blood is below normal because of too little iron. This condition is treated by correcting the cause of your iron deficiency. Take over-the-counter and prescription medicines only as told by your health care provider. This includes iron supplements and vitamins. To help your body use the iron from iron-rich foods, eat those foods at the same time as fresh fruits and vegetables that are high in vitamin C. Seek medical help if you have signs or symptoms of worsening anemia. This information is not intended to replace advice given to you by your health care provider. Make sure you discuss any questions you have with your health care provider. Document Revised: 09/03/2021 Document Reviewed: 09/03/2021 Elsevier Patient Education  2023 [employer] Peripheral Vascular Disease  Peripheral vascular disease (PVD) is a disease of the blood vessels. PVD may also be called peripheral artery disease (PAD) or poor circulation. PVD is the blocking or hardening of the arteries anywhere within the circulatory system beyond the heart. This can result in a decreased supply of blood to the arms, legs, and internal organs, such as the stomach or kidneys. However, PVD most often affects a person's lower legs and feet. Without treatment, PVD often worsens. PVD can lead to acute limb ischemia. This occurs when an arm or leg suddenly has trouble getting enough blood. This is a medical emergency. What are the causes? The most common cause of PVD is atherosclerosis. This is a buildup of fatty material and other substances (plaque)inside your arteries. Pieces of plaque can break off from the walls of an artery and become stuck in a smaller artery, blocking blood flow and possibly causing acute limb ischemia. Other common causes of PVD include: Blood clots that form inside the blood vessels. Injuries to blood vessels. Diseases that cause inflammation of blood vessels or cause blood vessel tightening (spasms). What  increases the risk? The following factors may make you more likely to develop this condition: A family history of PVD. Common medical conditions, including: High cholesterol. Diabetes. High blood pressure (hypertension). Heart disease. Known atherosclerotic disease in another area of the body. Past injury, such as burns or a broken bone. Other medical conditions, such as: Buerger's disease. This is caused by inflamed blood vessels in your hands and feet. Some forms of arthritis. Birth defects that affect the arteries in your legs. Kidney disease. Using tobacco and nicotine products. Not getting enough exercise. Obesity. Being age 30 or older, or being age 75 or older and having the other risk factors. What are the signs or symptoms? This condition may cause different symptoms. Your symptoms depend on what body part is not getting enough blood. Common signs and symptoms include: Cramps in your buttocks, legs, and feet. Intermittent claudication. This is pain and weakness in your legs during activity that resolves with rest. Leg pain at rest and leg numbness, tingling, or weakness. Coldness in a leg or foot, especially when compared to the other leg or foot. Skin or hair changes. These can include: Hair loss. Shiny skin. Pale or bluish skin. Thick toenails. Inability to get or maintain an erection (erectile dysfunction). Tiredness (fatigue). Weak pulse or no pulse in the feet. People with PVD are more  likely to develop open wounds (ulcers) and sores on their toes, feet, or legs. The ulcers or sores may take longer than normal to heal. How is this diagnosed? PVD is diagnosed based on your signs and symptoms, a physical exam, and your medical history. You may also have other tests to find the cause. Tests include: Ankle-brachial index test.This test compares the blood pressure readings of the legs and arms. This may also include an exercise ankle-brachial index test in which you walk  on a treadmill to check your symptoms. Doppler ultrasound. This takes pictures of blood flow through your blood vessels. Imaging studies that use dye to show blood flow. These are: CT angiogram. Magnetic resonance angiogram, or MRA. How is this treated? Treatment for PVD depends on the cause of your condition, how severe your symptoms are, and your age. Underlying causes need to be treated and controlled. These include long-term (chronic) conditions, such as diabetes, high cholesterol, and hypertension. Treatment may include: Lifestyle changes, such as: Quitting tobacco use. Exercising regularly. Following a low-fat, low-cholesterol diet. Not drinking alcohol. Taking medicines, such as: Blood thinners to prevent blood clots. Medicines to improve blood flow. Medicines to improve cholesterol levels. Procedures, such as: Angioplasty. This uses an inflated balloon to open a blocked artery and improve blood flow. Stent implant. This inserts a small mesh tube to keep a blocked artery open. Peripheral bypass surgery. This reroutes blood flow around a blocked artery. Surgery to remove dead tissue from an infected wound (debridement). Amputation. This is surgical removal of the affected limb. It may be necessary in cases of acute limb ischemia when medical or surgical treatments have not helped. Follow these instructions at home: Medicines Take over-the-counter and prescription medicines only as told by your health care provider. If you are taking blood thinners: Talk with your health care provider before you take any medicines that contain aspirin or NSAIDs, such as ibuprofen. These medicines increase your risk for dangerous bleeding. Take your medicine exactly as told, at the same time every day. Avoid activities that could cause injury or bruising, and follow instructions about how to prevent falls. Wear a medical alert bracelet or carry a card that lists what medicines you  take. Lifestyle     Exercise regularly. Ask your health care provider about some good activities for you. Talk with your health care provider about maintaining a healthy weight. If needed, ask about losing weight. Eat a diet that is low in fat and cholesterol. If you need help, talk with your health care provider. Do not drink alcohol. Do not use any products that contain nicotine or tobacco. These products include cigarettes, chewing tobacco, and vaping devices, such as e-cigarettes. If you need help quitting, ask your health care provider. General instructions Take good care of your feet. To do this: Wear comfortable shoes that fit well. Check your feet often for any cuts or sores. Get an annual influenza vaccine. Keep all follow-up visits. This is important. Where to find more information Society for Vascular Surgery: vascular.org American Heart Association: heart.org National Heart, Lung, and Blood Institute: [employer] Contact a health care provider if: You have leg cramps while walking. You have leg pain when you rest. Your leg or foot feels cold. Your skin changes color. You have erectile dysfunction. You have cuts or sores on your legs or feet that do not heal. Get help right away if: You have sudden changes in color and feeling of your arms or legs, such as: Your arm or  leg turns cold, numb, and blue. Your arm or leg becomes red, warm, swollen, painful, or numb. You have any symptoms of a stroke. "BE FAST" is an easy way to remember the main warning signs of a stroke: B - Balance. Signs are dizziness, sudden trouble walking, or loss of balance. E - Eyes. Signs are trouble seeing or a sudden change in vision. F - Face. Signs are sudden weakness or numbness of the face, or the face or eyelid drooping on one side. A - Arms. Signs are weakness or numbness in an arm. This happens suddenly and usually on one side of the body. S - Speech. Signs are sudden trouble speaking,  slurred speech, or trouble understanding what people say. T - Time. Time to call emergency services. Write down what time symptoms started. You have other signs of a stroke, such as: A sudden, severe headache with no known cause. Nausea or vomiting. Seizure. You have chest pain or trouble breathing. These symptoms may represent a serious problem that is an emergency. Do not wait to see if the symptoms will go away. Get medical help right away. Call your local emergency services (911 in the [place] Do not drive yourself to the hospital. Summary Peripheral vascular disease (PVD) is a disease of the blood vessels. PVD is the blocking or hardening of the arteries anywhere within the circulatory system beyond the heart. PVD may cause different symptoms. Your symptoms depend on what part of your body is not getting enough blood. Treatment for PVD depends on what caused it, how severe your symptoms are, and your age. This information is not intended to replace advice given to you by your health care provider. Make sure you discuss any questions you have with your health care provider. Document Revised: 01/29/2020 Document Reviewed: 01/29/2020 Elsevier Patient Education  2023 [employer].

## 2022-12-11 NOTE — Plan of Care (Signed)
Chronic Care Management Provider Comprehensive Care Plan    12/11/2022 Name: Erik [name] MRN: [id number] DOB: 1945/11/09  Referral to Chronic Care Management (CCM) services was placed by Provider:  Christel Mormon MD on Date: 11/18/22.  Chronic Condition 1: PERIPHERAL ARTERY DISEASE Provider Assessment and Plan PAD (peripheral artery disease) (HCC) Follows with vascular surgery. On ASA 81mg  Check lipid panel    Expected Outcome/Goals Addressed This Visit (Provider CCM goals/Provider Assessment and plan  CCM (DIABETES) EXPECTED OUTCOME: MONITOR, SELF-MANAGE AND REDUCE SYMPTOMS OF PERIPHERAL ARTERY DISEASE  Symptom Management Condition 1: Take medications as prescribed   Attend all scheduled provider appointments Call pharmacy for medication refills 3-7 days in advance of running out of medications Attend church or other social activities Call provider office for new concerns or questions  Work with the social worker to address care coordination needs and will continue to work with the clinical team to address health care and disease management related needs Encourage pt to wear compression daily and assist with application if needed Advanced directives packet mailed  Chronic Condition 2: IRON DEFICIENCY ANEMIA Provider Assessment and Plan B12 deficiency Chronic problem Check B12    Expected Outcome/Goals Addressed This Visit (Provider CCM goals/Provider Assessment and plan  CCM (IRON DEFICIENCY ANEMIA) EXPECTED OUTCOME: MONITOR, SELF-MANAGE AND REDUCE SYMPTOMS OF IRON DEFICIENCY ANEMIA  Symptom Management Condition 2: Take medications as prescribed   Attend all scheduled provider appointments Call pharmacy for medication refills 3-7 days in advance of running out of medications Attend church or other social activities Call provider office for new concerns or questions  call the Suicide and Crisis Lifeline: 988 call the [place] National Suicide Prevention Lifeline:  772-699-3527 or TTY: 838-733-4261 TTY 970-716-1221) to talk to a trained counselor call 1-800-273-TALK (toll free, 24 hour hotline) go to [facility] [address] [place] [phone]) call the [facility] Crisis Line: ([phone])660-2660 call 911 if experiencing a Mental Health or Behavioral Health Crisis   Chronic Condition 3: COGNITIVE IMPAIRMENT Provider Assessment and Plan Cognitive impairment Patient could not perform any task on MOCA. Outside of testing he converses normally and gives a detailed history. His mother and sister had dementia.  I will try again to contact patients grandson to get collateral information. Patient has follow up with neurologist and I ask for grandson to request evaluation from his neurologist.  Check B12 and TSH today   Expected Outcome/Goals Addressed This Visit (Provider CCM goals/Provider Assessment and plan  CCM (COGNITIVE IMPAIRMENT) EXPECTED OUTCOME: MONITOR, SELF-MANAGE AND REDUCE SYMPTOMS OF COGNITIVE IMPAIRMENT  Symptom Management Condition 3: Take medications as prescribed   Attend all scheduled provider appointments Call pharmacy for medication refills 3-7 days in advance of running out of medications Attend church or other social activities Call provider office for new concerns or questions  call the Suicide and Crisis Lifeline: 988 call the [place] National Suicide Prevention Lifeline: 239-229-2656 or TTY: (669) 808-9932 TTY 713-879-8904) to talk to a trained counselor call 1-800-273-TALK (toll free, 24 hour hotline) go to 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   B12 deficiency 06/25/2015   Loss of weight 10/26/2014   Dysphagia, pharyngoesophageal phase 10/26/2014   Low back pain 07/10/2014   Orthostatic hypotension 07/07/2014   Orthostasis 07/07/2014    Medication Management  Current Outpatient Medications:    acetaminophen (TYLENOL) 500 MG tablet, Take 500 mg by mouth every 6 (six) hours as needed., Disp: , Rfl:    Amifampridine Phosphate 10 MG TABS, Take 15 mg by mouth 3 (three) times daily., Disp: , Rfl:    aspirin EC 81 MG tablet, Take 81 mg by mouth daily., Disp: , Rfl:    atorvastatin (LIPITOR) 40 MG tablet, Take 1 tablet (40 mg total) by mouth daily., Disp: 90 tablet, Rfl: 3   Cholecalciferol (D3 5000) 125 MCG (5000 UT) capsule, Take 5,000 Units by mouth daily., Disp: , Rfl:    furosemide (LASIX) 40 MG tablet, TAKE 1 TABLET BY MOUTH ON MONDAY, WEDNESDAY AND FRIDAY - MAY TAKE ADDITIONAL AS NEEDED FOR SWELLING. PT NEEDS APPT FOR FURTHER REFILLS, Disp: 60 tablet, Rfl: 3   midodrine (PROAMATINE) 5 MG tablet, TAKE 1 TABLET BY MOUTH THREE TIMES DAILY WITH MEALS, Disp: 270 tablet, Rfl: 0   trolamine salicylate (ASPERCREME) 10 % cream, Apply 1 application topically as needed for muscle pain., Disp: , Rfl:    vitamin B-12 (CYANOCOBALAMIN) 1000 MCG tablet, Take 1,000 mcg by mouth daily., Disp: , Rfl:    Zinc 50 MG CAPS, Take 50 mg by mouth every morning., Disp: , Rfl:   Cognitive Assessment Identity Confirmed: : Name; DOB (HIPAA verified per daugher Erik [name]) Cognitive Status: Abnormal (per daughter Erik [name]) Other:  : Verified by Daughter, Erik [name]   Functional Assessment Hearing Difficulty or Deaf: no Wear Glasses or Blind: no Concentrating, Remembering or Making  Decisions Difficulty (CP): yes Concentration Management: cognitve impairment Difficulty Communicating: no Difficulty Eating/Swallowing: no Walking or Climbing Stairs Difficulty: no Dressing/Bathing Difficulty: no Doing Errands Independently Difficulty (such as shopping) (CP): yes Errands Management: family assists   Caregiver Assessment  Primary Source of Support/Comfort: child(ren) Name of Support/Comfort Primary Source: daughter Erik [name] People in Home: alone   Planned Interventions  Evaluation of current treatment plan related to Peripheral Artery Disease and patient's adherence to plan as established by provider Advised patient to wear compression hose per doctor's order Provided education to patient and/or caregiver about advanced directives Reviewed medications with patient and discussed importance of taking as prescribed Assessed social determinant of health barriers Encouraged daughter to give pt reminders and assist to apply compression hose Advanced directives packet mailed Evaluation of current treatment plan related to Iron Deficiency Anemia and patient's adherence to plan as established by provider Provided education to patient re: Iron Deficiency Anemia Advised patient to discuss change in health status/ symptoms with provider Assessed social determinant of health barriers Reviewed all upcoming scheduled appointments Reviewed importance of regular laboratory monitoring Evaluation of current treatment plan related to Iron Deficiency Anemia and patient's adherence to plan as established by provider Provided education to patient re: Iron Deficiency Anemia Advised patient to discuss change in health status/ symptoms with provider Assessed social determinant of health barriers Reviewed all upcoming scheduled appointments Reviewed importance of regular laboratory monitoring Reviewed medications including importance of taking as prescribed, Discussed importance of  discussing diagnosis with provider, Discussed importance of attendance to all provider appointments, and Advised to contact provider for new or worsening symptoms  Interaction  and coordination with outside resources, practitioners, and providers See CCM Referral  Care Plan: Available in MyChart

## 2022-12-11 NOTE — Chronic Care Management (AMB) (Signed)
Chronic Care Management   CCM RN Visit Note  12/11/2022 Name: Erik [name] MRN: [id number] DOB: [birth year]  Subjective: Erik [name] is a 77 y.o. year old male who is a primary care patient of Erik Lade, MD. The patient was referred to the Chronic Care Management team for assistance with care management needs subsequent to provider initiation of CCM services and plan of care.    Today's Visit:  Engaged with patient by telephone for initial visit.     SDOH Interventions Today    Flowsheet Row Most Recent Value  SDOH Interventions   Food Insecurity Interventions Intervention Not Indicated  Housing Interventions Intervention Not Indicated  Transportation Interventions Intervention Not Indicated  Utilities Interventions Intervention Not Indicated  Financial Strain Interventions Intervention Not Indicated  Physical Activity Interventions Other (Comments)  [per daughter pt does not exercise, not interested]  Stress Interventions Intervention Not Indicated  Social Connections Interventions Intervention Not Indicated, Patient Refused  [cognitve issues]         Goals Addressed             This Visit's Progress    CCM (COGNITIVE IMPAIRMENT) EXPECTED OUTCOME: MONITOR, SELF-MANAGE AND REDUCE SYMPTOMS OF COGNITIVE IMPAIRMENT       Current Barriers:  Knowledge Deficits related to Cognitive Impairment Chronic Disease Management support and education needs related to Cognitive Impairment Cognitive Deficits- LCSW is working with patient/ daughter Patient lives alone and daughter feels he is safe at home with oversight provided by family  Planned Interventions: Reviewed medications including importance of taking as prescribed, Discussed importance of discussing diagnosis with provider, Discussed importance of attendance to all provider appointments, and Advised to contact provider for new or worsening symptoms  Symptom Management: Take medications as prescribed    Attend all scheduled provider appointments Call pharmacy for medication refills 3-7 days in advance of running out of medications Attend church or other social activities Call provider office for new concerns or questions  call the Suicide and Crisis Lifeline: 988 call the [place] National Suicide Prevention Lifeline: 5344458466 or TTY: 3060653205 TTY 629-447-1467) to talk to a trained counselor call 1-800-273-TALK (toll free, 24 hour hotline) go to [facility] [address], [place] [phone]) call the [facility] Crisis Line: [phone] call 911 if experiencing a Mental Health or Behavioral Health Crisis   Follow Up Plan: Telephone follow up appointment with care management team member scheduled for:  03/08/23 at 130 pm       CCM (IRON DEFICIENCY ANEMIA) EXPECTED OUTCOME: MONITOR, SELF-MANAGE AND REDUCE SYMPTOMS OF IRON DEFICIENCY ANEMIA       Current Barriers:  Knowledge Deficits related to Iron Deficiency Anemia management Chronic Disease Management support and education needs related to Iron Deficiency Anemia Cognitive Deficits No Advanced Directives in place  Planned Interventions: Evaluation of current treatment plan related to Iron Deficiency Anemia and patient's adherence to plan as established by provider Provided education to patient re: Iron Deficiency Anemia Advised patient to discuss change in health status/ symptoms with provider Assessed social determinant of health barriers Reviewed all upcoming scheduled appointments Reviewed importance of regular laboratory monitoring  Symptom Management: Take medications as prescribed   Attend all scheduled provider appointments Call pharmacy for medication refills 3-7 days in advance of running out of medications Attend church or other social activities Call provider office for new concerns or questions  call the Suicide and Crisis Lifeline: 988 call the [place] National  Suicide Prevention Lifeline: 260-527-4873 or TTY: 279-025-2078  TTY 228-452-0006) to talk to a trained counselor call 1-800-273-TALK (toll free, 24 hour hotline) go to [facility] [address] [place] [phone]) call the [facility] Crisis Line: [phone] call 911 if experiencing a Mental Health or Behavioral Health Crisis   Follow Up Plan: Telephone follow up appointment with care management team member scheduled for: 03/08/23 at 130 pm       CCM (PERIPHERAL ARTERY DISEASE) EXPECTED OUTCOME: MONITOR, SELF-MANAGE AND REDUCE SYMPTOMS OF PERIPHERAL ARTERY DISEASE       Current Barriers:  Knowledge Deficits related to Peripheral Artery Disease Care Coordination needs related to social work in a patient with Peripheral Artery Disease Chronic Disease Management support and education needs related to Peripheral Artery Disease Cognitive Deficits No Advanced Directives in place-daughter requests documents be mailed Spoke with daughter Erik [name] who reports pt lives alone and she lives nearby, she and other family members see pt daily, speak with him on the phone and provide oversight as needed, cook meals for him, provide transportation.  Patient has cognitive impairment, forgetfulness, varies day to day, repeats same questions.  Per daughter, pt does not wear compression hose but is supposed to.  Planned Interventions: Evaluation of current treatment plan related to Peripheral Artery Disease and patient's adherence to plan as established by provider Advised patient to wear compression hose per doctor's order Provided education to patient and/or caregiver about advanced directives Reviewed medications with patient and discussed importance of taking as prescribed Assessed social determinant of health barriers Encouraged daughter to give pt reminders and assist to apply compression hose Advanced directives packet mailed  Symptom  Management: Take medications as prescribed   Attend all scheduled provider appointments Call pharmacy for medication refills 3-7 days in advance of running out of medications Attend church or other social activities Call provider office for new concerns or questions  Work with the social worker to address care coordination needs and will continue to work with the clinical team to address health care and disease management related needs Encourage pt to wear compression daily and assist with application if needed Advanced directives packet mailed  Follow Up Plan: Telephone follow up appointment with care management team member scheduled for:  03/08/23 at 130 pm          Plan: Telephone follow up appointment scheduled for: 03/08/23 at 130 pm  Irving Shows [facility], BSN RN Case Manager [facility] [phone]

## 2022-12-14 ENCOUNTER — Encounter: Payer: Self-pay | Admitting: *Deleted

## 2022-12-18 ENCOUNTER — Telehealth: Payer: Self-pay | Admitting: Internal Medicine

## 2022-12-18 NOTE — Telephone Encounter (Signed)
Called patient to schedule Medicare Annual Wellness Visit (AWV). Left message for patient to call back and schedule Medicare Annual Wellness Visit (AWV).  Please schedule an appointment at any time with Abby, NHA. .  If any questions, please contact me at [phone].  Thank you,  Stephanie,  AMB Clinical Support [facility] AWV Program Direct Dial ??[phone]    

## 2022-12-23 ENCOUNTER — Ambulatory Visit: Payer: Self-pay | Admitting: *Deleted

## 2022-12-23 ENCOUNTER — Encounter: Payer: Self-pay | Admitting: *Deleted

## 2022-12-23 NOTE — Patient Outreach (Signed)
Care Coordination   Follow Up Visit Note   12/23/2022  Name: Erik [name] MRN: [id number] DOB: 06-18-1946  Erik [name] is a 77 y.o. year old male who sees [name], [name], MD for primary care. I spoke with Erik [name] by phone today.  What matters to the patients health and wellness today?  Receive Assistance Applying for Personal Care Services.    Goals Addressed             This Visit's Progress    Receive Assistance Applying for Personal Care Services.   On track    Care Coordination Interventions:  Interventions Today    Flowsheet Row Most Recent Value  Chronic Disease   Chronic disease during today's visit Other  [Inability to Perform Activities of Daily Living Independently, Prediabetes, Cognitive Impairment & Pharyngoesophageal Phase Dysphagia]  General Interventions   General Interventions Discussed/Reviewed General Interventions Discussed, Labs, Vaccines, Doctor Visits, Health Screening, General Interventions Reviewed, Annual Eye Exam, Durable Medical Equipment (DME), Community Resources, Level of Care, Communication with  [Encouraged]  Labs Hgb A1c annually  [Encouraged]  Vaccines COVID-19, Flu, Pneumonia, RSV, Shingles, Tetanus/Pertussis/Diphtheria  [Encouraged]  Doctor Visits Discussed/Reviewed Doctor Visits Discussed, Specialist, Doctor Visits Reviewed, Annual Wellness Visits, PCP  [Encouraged]  Health Screening Bone Density, Colonoscopy, Prostate  [Encouraged]  PCP/Specialist Visits Compliance with follow-up visit  [Encouraged]  Communication with PCP/Specialists, RN  Level of Care Adult Daycare, Personal Care Services, Applications, Assisted Living, Skilled Nursing Facility  [Encouraged]  Applications Personal Care Services  [Encouraged]  Exercise Interventions   Exercise Discussed/Reviewed Exercise Discussed, Assistive device use and maintanence, Exercise Reviewed, Physical Activity  [Encouraged]  Physical Activity Discussed/Reviewed  Physical Activity Discussed, Home Exercise Program (HEP), Physical Activity Reviewed, Types of exercise  [Encouraged]  Education Interventions   Education Provided Provided Therapist, sports, Provided Web-based Education, Provided Education  Provided Verbal Education On Development worker, community, [employer], When to see the doctor, Mental Health/Coping with Illness, Nutrition, Eye Care, Labs, Applications, Exercise, Medication  [Encouraged]  Applications Personal Care Services  [Encouraged]  Mental Health Interventions   Mental Health Discussed/Reviewed Mental Health Discussed, Anxiety, Depression, Grief and Loss, Substance Abuse, Suicide, Other, Crisis, Coping Strategies, Mental Health Reviewed  [Domestic Violence]  Nutrition Interventions   Nutrition Discussed/Reviewed Nutrition Discussed, Adding fruits and vegetables, Increaing proteins, Decreasing fats, Decreasing salt, Nutrition Reviewed, Carbohydrate meal planning, Decreasing sugar intake, Supplmental nutrition, Portion sizes, Fluid intake  [Encouraged]  Pharmacy Interventions   Pharmacy Dicussed/Reviewed Pharmacy Topics Discussed, Pharmacy Topics Reviewed, Medication Adherence, Affording Medications  [Encouraged]  Safety Interventions   Safety Discussed/Reviewed Safety Discussed, Safety Reviewed, Fall Risk, Home Safety  [Encouraged]  Home Safety Assistive Devices, Need for home safety assessment, Refer for community resources  [Encouraged]  Advanced Directive Interventions   Advanced Directives Discussed/Reviewed Advanced Directives Discussed  [Encouraged]     Active Listening & Reflection Utilized.  Verbalization of Feelings Encouraged.  Emotional Support Provided. Feelings of Caregiver Burnout Validated. Caregiver Stress Acknowledged. Caregiver Resources Reviewed. Caregiver Support Groups Mailed. Self-Enrollment in Caregiver Support Group of Interest Emphasized. Problem Solving Interventions Activated. Task-Centered Solutions  Implemented.   Solution-Focused Strategies Employed. Acceptance & Commitment Therapy Performed. Cognitive Behavioral Therapy Initiated. Client-Centered Therapy Indicated. Confirmed Receipt & Thoroughly Reviewed The Following List of Erik [name], Nurse, adult, Wellsite geologist, to [employer] Understanding & Answer Questions: ~ Adult Day Care Programs  ~ In-Home Care & Respite Agencies ~ Home Health Care Agencies ~ Respite Care Agencies & Facilities ~ Personal Care Services Application  ~ Personal  Care Services Agency Providers Encouraged Careers information officer with Erik [name], Services & Resources of Interest, in An Effort to Obtain Care & Supervision in The Home. Encouraged Tour of Adult Day Care Programs of Interest, in An Effort to Obtain Care & Supervision in The Community. Encouraged Completion of Application for Personal Care Services & Submission to Primary Care Provider, Dr. Delmar Landau with [facility] [phone]), for Review & Signature. Encouraged Careers information officer with CSW (# [phone]), if You Have Questions, Need Assistance, or If Additional Social Work Needs Are Identified Between Now & Our Next Scheduled Follow-Up Outreach Call.      SDOH assessments and interventions completed:  Yes.  Care Coordination Interventions:  Yes, provided.   Follow up plan: Follow up call scheduled for 01/06/2023 at 11:30 am.   Encounter Outcome:  Pt. Visit Completed.   Danford Bad, BSW, MSW, LCSW  Licensed Restaurant manager, [employer] [facility]  Mailing [place] [address] [place], [place] [address] Physical Address-[address] [place], [place] [address] Toll Free Main # [phone] Fax # [phone] Cell # [phone] [email or web address]
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
## 2022-12-30 ENCOUNTER — Encounter: Payer: Medicare Other | Admitting: *Deleted

## 2023-01-06 ENCOUNTER — Encounter: Payer: Self-pay | Admitting: *Deleted

## 2023-01-06 ENCOUNTER — Ambulatory Visit: Payer: Self-pay | Admitting: *Deleted
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
## 2023-01-08 DIAGNOSIS — G3109 Other frontotemporal dementia: Secondary | ICD-10-CM

## 2023-01-08 DIAGNOSIS — D508 Other iron deficiency anemias: Secondary | ICD-10-CM

## 2023-01-19 ENCOUNTER — Other Ambulatory Visit: Payer: Self-pay

## 2023-01-19 ENCOUNTER — Inpatient Hospital Stay (HOSPITAL_COMMUNITY)
Admission: EM | Admit: 2023-01-19 | Discharge: 2023-01-21 | DRG: 308 | Disposition: A | Discharge status: Skilled Nursing Facility | Payer: Medicare Other | Attending: Family Medicine | Admitting: Family Medicine

## 2023-01-19 ENCOUNTER — Emergency Department (HOSPITAL_COMMUNITY): Payer: Medicare Other

## 2023-01-19 ENCOUNTER — Encounter (HOSPITAL_COMMUNITY): Payer: Self-pay | Admitting: Emergency Medicine

## 2023-01-19 DIAGNOSIS — I480 Paroxysmal atrial fibrillation: Secondary | ICD-10-CM | POA: Diagnosis present

## 2023-01-19 DIAGNOSIS — G7080 Lambert-Eaton syndrome, unspecified: Secondary | ICD-10-CM | POA: Diagnosis not present

## 2023-01-19 DIAGNOSIS — Z043 Encounter for examination and observation following other accident: Secondary | ICD-10-CM | POA: Diagnosis not present

## 2023-01-19 DIAGNOSIS — G9341 Metabolic encephalopathy: Secondary | ICD-10-CM | POA: Diagnosis present

## 2023-01-19 DIAGNOSIS — S0990XA Unspecified injury of head, initial encounter: Secondary | ICD-10-CM | POA: Diagnosis not present

## 2023-01-19 DIAGNOSIS — M6282 Rhabdomyolysis: Secondary | ICD-10-CM

## 2023-01-19 DIAGNOSIS — I739 Peripheral vascular disease, unspecified: Secondary | ICD-10-CM | POA: Diagnosis present

## 2023-01-19 DIAGNOSIS — Z7982 Long term (current) use of aspirin: Secondary | ICD-10-CM | POA: Diagnosis not present

## 2023-01-19 DIAGNOSIS — D649 Anemia, unspecified: Secondary | ICD-10-CM | POA: Diagnosis not present

## 2023-01-19 DIAGNOSIS — Z9181 History of falling: Secondary | ICD-10-CM | POA: Diagnosis not present

## 2023-01-19 DIAGNOSIS — I10 Essential (primary) hypertension: Secondary | ICD-10-CM | POA: Diagnosis not present

## 2023-01-19 DIAGNOSIS — M6281 Muscle weakness (generalized): Secondary | ICD-10-CM | POA: Diagnosis not present

## 2023-01-19 DIAGNOSIS — S199XXA Unspecified injury of neck, initial encounter: Secondary | ICD-10-CM | POA: Diagnosis not present

## 2023-01-19 DIAGNOSIS — E538 Deficiency of other specified B group vitamins: Secondary | ICD-10-CM | POA: Diagnosis present

## 2023-01-19 DIAGNOSIS — R2681 Unsteadiness on feet: Secondary | ICD-10-CM | POA: Diagnosis not present

## 2023-01-19 DIAGNOSIS — Z800 Family history of malignant neoplasm of digestive organs: Secondary | ICD-10-CM | POA: Diagnosis not present

## 2023-01-19 DIAGNOSIS — E860 Dehydration: Secondary | ICD-10-CM | POA: Diagnosis not present

## 2023-01-19 DIAGNOSIS — Z79899 Other long term (current) drug therapy: Secondary | ICD-10-CM

## 2023-01-19 DIAGNOSIS — I2489 Other forms of acute ischemic heart disease: Secondary | ICD-10-CM | POA: Diagnosis not present

## 2023-01-19 DIAGNOSIS — I4891 Unspecified atrial fibrillation: Secondary | ICD-10-CM | POA: Diagnosis not present

## 2023-01-19 DIAGNOSIS — Z1152 Encounter for screening for COVID-19: Secondary | ICD-10-CM

## 2023-01-19 DIAGNOSIS — R41841 Cognitive communication deficit: Secondary | ICD-10-CM | POA: Diagnosis not present

## 2023-01-19 DIAGNOSIS — I272 Pulmonary hypertension, unspecified: Secondary | ICD-10-CM | POA: Diagnosis present

## 2023-01-19 DIAGNOSIS — R7989 Other specified abnormal findings of blood chemistry: Secondary | ICD-10-CM | POA: Diagnosis not present

## 2023-01-19 DIAGNOSIS — Z87891 Personal history of nicotine dependence: Secondary | ICD-10-CM | POA: Diagnosis not present

## 2023-01-19 DIAGNOSIS — R410 Disorientation, unspecified: Principal | ICD-10-CM

## 2023-01-19 DIAGNOSIS — D519 Vitamin B12 deficiency anemia, unspecified: Secondary | ICD-10-CM | POA: Diagnosis not present

## 2023-01-19 DIAGNOSIS — J9811 Atelectasis: Secondary | ICD-10-CM | POA: Diagnosis not present

## 2023-01-19 DIAGNOSIS — D509 Iron deficiency anemia, unspecified: Secondary | ICD-10-CM | POA: Diagnosis not present

## 2023-01-19 DIAGNOSIS — I959 Hypotension, unspecified: Secondary | ICD-10-CM | POA: Diagnosis not present

## 2023-01-19 DIAGNOSIS — Z832 Family history of diseases of the blood and blood-forming organs and certain disorders involving the immune mechanism: Secondary | ICD-10-CM | POA: Diagnosis not present

## 2023-01-19 DIAGNOSIS — M5136 Other intervertebral disc degeneration, lumbar region: Secondary | ICD-10-CM | POA: Diagnosis not present

## 2023-01-19 DIAGNOSIS — R569 Unspecified convulsions: Secondary | ICD-10-CM | POA: Diagnosis not present

## 2023-01-19 DIAGNOSIS — G3184 Mild cognitive impairment, so stated: Secondary | ICD-10-CM | POA: Diagnosis not present

## 2023-01-19 DIAGNOSIS — R7303 Prediabetes: Secondary | ICD-10-CM | POA: Diagnosis not present

## 2023-01-19 LAB — URINALYSIS, ROUTINE W REFLEX MICROSCOPIC
Bacteria, UA: NONE SEEN
Bilirubin Urine: NEGATIVE
Glucose, UA: NEGATIVE mg/dL
Ketones, ur: 20 mg/dL — AB
Leukocytes,Ua: NEGATIVE
Nitrite: NEGATIVE
Protein, ur: 30 mg/dL — AB
Specific Gravity, Urine: 1.016 (ref 1.005–1.030)
pH: 6 (ref 5.0–8.0)

## 2023-01-19 LAB — COMPREHENSIVE METABOLIC PANEL
ALT: 38 U/L (ref 0–44)
AST: 64 U/L — ABNORMAL HIGH (ref 15–41)
Albumin: 3.1 g/dL — ABNORMAL LOW (ref 3.5–5.0)
Alkaline Phosphatase: 67 U/L (ref 38–126)
Anion gap: 14 (ref 5–15)
BUN: 21 mg/dL (ref 8–23)
CO2: 22 mmol/L (ref 22–32)
Calcium: 8.6 mg/dL — ABNORMAL LOW (ref 8.9–10.3)
Chloride: 105 mmol/L (ref 98–111)
Creatinine, Ser: 1.2 mg/dL (ref 0.61–1.24)
GFR, Estimated: >60 mL/min (ref >60)
Glucose, Bld: 102 mg/dL — ABNORMAL HIGH (ref 70–99)
Potassium: 3.5 mmol/L (ref 3.5–5.1)
Sodium: 141 mmol/L (ref 135–145)
Total Bilirubin: 2 mg/dL — ABNORMAL HIGH (ref 0.3–1.2)
Total Protein: 7.8 g/dL (ref 6.5–8.1)

## 2023-01-19 LAB — CBC WITH DIFFERENTIAL/PLATELET
Abs Immature Granulocytes: 0.04 10*3/uL (ref 0.00–0.07)
Basophils Absolute: 0 10*3/uL (ref 0.0–0.1)
Basophils Relative: 0 %
Eosinophils Absolute: 0.2 10*3/uL (ref 0.0–0.5)
Eosinophils Relative: 1 %
HCT: 32.5 % — ABNORMAL LOW (ref 39.0–52.0)
Hemoglobin: 10.4 g/dL — ABNORMAL LOW (ref 13.0–17.0)
Immature Granulocytes: 0 %
Lymphocytes Relative: 10 %
Lymphs Abs: 1.1 10*3/uL (ref 0.7–4.0)
MCH: 22.9 pg — ABNORMAL LOW (ref 26.0–34.0)
MCHC: 32 g/dL (ref 30.0–36.0)
MCV: 71.4 fL — ABNORMAL LOW (ref 80.0–100.0)
Monocytes Absolute: 1.1 10*3/uL — ABNORMAL HIGH (ref 0.1–1.0)
Monocytes Relative: 10 %
Neutro Abs: 8.4 10*3/uL — ABNORMAL HIGH (ref 1.7–7.7)
Neutrophils Relative %: 79 %
Platelets: 237 10*3/uL (ref 150–400)
RBC: 4.55 MIL/uL (ref 4.22–5.81)
RDW: 14.7 % (ref 11.5–15.5)
WBC: 10.8 10*3/uL — ABNORMAL HIGH (ref 4.0–10.5)
nRBC: 0 % (ref 0.0–0.2)

## 2023-01-19 LAB — TROPONIN I (HIGH SENSITIVITY)
Troponin I (High Sensitivity): 86 ng/L — ABNORMAL HIGH (ref <18)
Troponin I (High Sensitivity): 81 ng/L — ABNORMAL HIGH (ref <18)

## 2023-01-19 LAB — CBG MONITORING, ED: Glucose-Capillary: 64 mg/dL — ABNORMAL LOW (ref 70–99)

## 2023-01-19 MED ORDER — SODIUM CHLORIDE 0.9 % IV BOLUS
1000.0000 mL | Freq: Once | INTRAVENOUS | Status: AC
  Administered 2023-01-19: 1000 mL via INTRAVENOUS

## 2023-01-19 MED ORDER — DEXTROSE 50 % IV SOLN
25.0000 mL | Freq: Once | INTRAVENOUS | Status: AC
  Administered 2023-01-19: 25 mL via INTRAVENOUS
  Filled 2023-01-19: qty 50

## 2023-01-19 MED ORDER — DILTIAZEM HCL 25 MG/5ML IV SOLN
10.0000 mg | Freq: Once | INTRAVENOUS | Status: AC
  Administered 2023-01-19: 10 mg via INTRAVENOUS

## 2023-01-19 MED ORDER — DILTIAZEM HCL-DEXTROSE 125-5 MG/125ML-% IV SOLN (PREMIX)
5.0000 mg/h | INTRAVENOUS | Status: DC
  Administered 2023-01-19 – 2023-01-20 (×2): 5 mg/h via INTRAVENOUS
  Filled 2023-01-19 (×2): qty 125

## 2023-01-19 MED ORDER — METOPROLOL TARTRATE 5 MG/5ML IV SOLN
5.0000 mg | Freq: Once | INTRAVENOUS | Status: AC
  Administered 2023-01-19: 5 mg via INTRAVENOUS
  Filled 2023-01-19: qty 5

## 2023-01-19 MED ORDER — DILTIAZEM HCL 25 MG/5ML IV SOLN
10.0000 mg | Freq: Once | INTRAVENOUS | Status: AC
  Administered 2023-01-19: 10 mg via INTRAVENOUS
  Filled 2023-01-19: qty 5

## 2023-01-19 NOTE — ED Triage Notes (Signed)
Pt found in floor on welfare check by police. Pt's family has not heard from him in 2 days. Pt is altered and o2 was in 70s when ems arrived.

## 2023-01-19 NOTE — ED Provider Notes (Signed)
Erik [name] Provider Note   CSN: [id number] Arrival date & time: 01/19/23  2026     History  Chief Complaint  Patient presents with   Altered Mental Status    Erik [name] is a 77 y.o. male.  Patient has Pincus Badder myasthenic syndrome and according to his daughter she has not been able to reach him for 2 days.  He has not been answering the phone and has been going to voicemail.  She went over to the house and found him on the floor.  Patient is confused   Altered Mental Status      Home Medications Prior to Admission medications   Medication Sig Start Date End Date Taking? Authorizing Provider  acetaminophen (TYLENOL) 500 MG tablet Take 500 mg by mouth every 6 (six) hours as needed.    [provider]  Amifampridine Phosphate 10 MG TABS Take 15 mg by mouth 3 (three) times daily.    [provider]  aspirin EC 81 MG tablet Take 81 mg by mouth daily.    [provider]  atorvastatin (LIPITOR) 40 MG tablet Take 1 tablet (40 mg total) by mouth daily. 11/09/22   Gardenia Phlegm, MD  Cholecalciferol (D3 5000) 125 MCG (5000 UT) capsule Take 5,000 Units by mouth daily.    [provider]  furosemide (LASIX) 40 MG tablet TAKE 1 TABLET BY MOUTH ON MONDAY, WEDNESDAY AND FRIDAY - MAY TAKE ADDITIONAL AS NEEDED FOR SWELLING. PT NEEDS APPT FOR FURTHER REFILLS 07/31/22   Antoine Poche, MD  midodrine (PROAMATINE) 5 MG tablet TAKE 1 TABLET BY MOUTH THREE TIMES DAILY WITH MEALS 12/01/22   Antoine Poche, MD  trolamine salicylate (ASPERCREME) 10 % cream Apply 1 application topically as needed for muscle pain.    [provider]  vitamin B-12 (CYANOCOBALAMIN) 1000 MCG tablet Take 1,000 mcg by mouth daily.    [provider]  Zinc 50 MG CAPS Take 50 mg by mouth every morning.    [provider]      Allergies    Patient has no known allergies.    Review of Systems    Review of Systems  Physical Exam Updated Vital Signs BP 121/60   Pulse (!) 110   Temp 98 F (36.7 C) (Oral)   Resp 20   Ht 6\' 2"  (1.88 m)   Wt 92 kg   SpO2 94%   BMI 26.04 kg/m  Physical Exam  ED Results / Procedures / Treatments   Labs (all labs ordered are listed, but only abnormal results are displayed) Labs Reviewed  CBC WITH DIFFERENTIAL/PLATELET - Abnormal; Notable for the following components:      Result Value   WBC 10.8 (*)    Hemoglobin 10.4 (*)    HCT 32.5 (*)    MCV 71.4 (*)    MCH 22.9 (*)    Neutro Abs 8.4 (*)    Monocytes Absolute 1.1 (*)    All other components within normal limits  COMPREHENSIVE METABOLIC PANEL - Abnormal; Notable for the following components:   Glucose, Bld 102 (*)    Calcium 8.6 (*)    Albumin 3.1 (*)    AST 64 (*)    Total Bilirubin 2.0 (*)    All other components within normal limits  URINALYSIS, ROUTINE W REFLEX MICROSCOPIC - Abnormal; Notable for the following components:   Hgb urine dipstick SMALL (*)    Ketones, ur 20 (*)  Protein, ur 30 (*)    All other components within normal limits  CBG MONITORING, ED - Abnormal; Notable for the following components:   Glucose-Capillary 64 (*)    All other components within normal limits  TROPONIN I (HIGH SENSITIVITY) - Abnormal; Notable for the following components:   Troponin I (High Sensitivity) 86 (*)    All other components within normal limits  TROPONIN I (HIGH SENSITIVITY) - Abnormal; Notable for the following components:   Troponin I (High Sensitivity) 81 (*)    All other components within normal limits  CK    EKG EKG Interpretation  Date/Time:  Tuesday January 19 2023 20:32:35 EDT Ventricular Rate:  162 PR Interval:  53 QRS Duration: 102 QT Interval:  294 QTC Calculation: 483 R Axis:   -43 Text Interpretation: Supraventricular tachycardia Paired ventricular premature complexes Incomplete RBBB and LAFB Confirmed by Bethann Berkshire [phone]) on 01/19/2023 9:50:14  PM  Radiology CT Cervical Spine Wo Contrast  Result Date: 01/19/2023 CLINICAL DATA:  Ataxia, cervical trauma EXAM: CT CERVICAL SPINE WITHOUT CONTRAST TECHNIQUE: Multidetector CT imaging of the cervical spine was performed without intravenous contrast. Multiplanar CT image reconstructions were also generated. RADIATION DOSE REDUCTION: This exam was performed according to the departmental dose-optimization program which includes automated exposure control, adjustment of the mA and/or kV according to patient size and/or use of iterative reconstruction technique. COMPARISON:  None Available. FINDINGS: Alignment: No traumatic subluxation. Trace anterolisthesis of C2 on C3 and retrolisthesis of C5 on C6 likely degenerative. Skull base and vertebrae: No acute fracture. Vertebral body heights are maintained. The dens and skull base are intact. Soft tissues and spinal canal: No prevertebral fluid or swelling. No visible canal hematoma. Disc levels: Moderate degenerative disc disease and facet hypertrophy. No high-grade canal stenosis. Upper chest: Emphysema. No acute finding. Other: None. IMPRESSION: Moderate degenerative disc disease and facet hypertrophy without acute fracture or subluxation. Electronically Signed   By: Narda Rutherford M.D.   On: 01/19/2023 23:06   CT Head Wo Contrast  Result Date: 01/19/2023 CLINICAL DATA:  Head trauma, intracranial arterial injury suspected Altered mental status. EXAM: CT HEAD WITHOUT CONTRAST TECHNIQUE: Contiguous axial images were obtained from the base of the skull through the vertex without intravenous contrast. RADIATION DOSE REDUCTION: This exam was performed according to the departmental dose-optimization program which includes automated exposure control, adjustment of the mA and/or kV according to patient size and/or use of iterative reconstruction technique. COMPARISON:  None Available. FINDINGS: Brain: No intracranial hemorrhage, mass effect, or midline shift. Brain  volume is normal for age. No hydrocephalus. The basilar cisterns are patent. Mild periventricular chronic small vessel ischemia. Punctate lacunar infarct versus prominent perivascular space in the left basal ganglia. No evidence of territorial infarct or acute ischemia. No extra-axial or intracranial fluid collection. Vascular: Atherosclerosis of skullbase vasculature without hyperdense vessel or abnormal calcification. Skull: No fracture or focal lesion. Sinuses/Orbits: Paranasal sinuses and mastoid air cells are clear. The visualized orbits are unremarkable. Bilateral cataract resection. Other: None. IMPRESSION: 1. No acute intracranial abnormality. 2. Mild chronic small vessel ischemia. Electronically Signed   By: Narda Rutherford M.D.   On: 01/19/2023 23:03   DG Chest Port 1 View  Result Date: 01/19/2023 CLINICAL DATA:  Patient found on the floor. EXAM: PORTABLE CHEST 1 VIEW COMPARISON:  November 15, 2015 FINDINGS: The heart size and mediastinal contours are within normal limits. Mild, diffuse, chronic appearing increased interstitial lung markings are seen. Mild atelectasis is noted within the left lung base. There  is no evidence of a pleural effusion or pneumothorax. Multilevel degenerative changes are seen throughout the thoracic spine. IMPRESSION: Mild left basilar atelectasis. Electronically Signed   By: Aram Candela M.D.   On: 01/19/2023 22:17   DG Pelvis Portable  Result Date: 01/19/2023 CLINICAL DATA:  Patient found on the floor. EXAM: PORTABLE PELVIS 1-2 VIEWS COMPARISON:  None Available. FINDINGS: There is no evidence of pelvic fracture or diastasis. No pelvic bone lesions are seen. IMPRESSION: Negative. Electronically Signed   By: Aram Candela M.D.   On: 01/19/2023 22:16    Procedures Procedures    Medications Ordered in ED Medications  diltiazem (CARDIZEM) 125 mg in dextrose 5% 125 mL (1 mg/mL) infusion (15 mg/hr Intravenous Rate/Dose Change 01/19/23 2212)  sodium chloride 0.9 %  bolus 1,000 mL (0 mLs Intravenous Stopped 01/19/23 2152)  diltiazem (CARDIZEM) injection 10 mg (10 mg Intravenous Given 01/19/23 2054)  dextrose 50 % solution 25 mL (25 mLs Intravenous Given 01/19/23 2050)  sodium chloride 0.9 % bolus 1,000 mL (0 mLs Intravenous Stopped 01/19/23 2236)  diltiazem (CARDIZEM) injection 10 mg (10 mg Intravenous Given 01/19/23 2129)  metoprolol tartrate (LOPRESSOR) injection 5 mg (5 mg Intravenous Given 01/19/23 2200)    ED Course/ Medical Decision Making/ A&P  CRITICAL CARE Performed by: Bethann Berkshire Total critical care time: 45 minutes Critical care time was exclusive of separately billable procedures and treating other patients. Critical care was necessary to treat or prevent imminent or life-threatening deterioration. Critical care was time spent personally by me on the following activities: development of treatment plan with patient and/or surrogate as well as nursing, discussions with consultants, evaluation of patient's response to treatment, examination of patient, obtaining history from patient or surrogate, ordering and performing treatments and interventions, ordering and review of laboratory studies, ordering and review of radiographic studies, pulse oximetry and re-evaluation of patient's condition.                            Medical Decision Making Amount and/or Complexity of Data Reviewed Labs: ordered. Radiology: ordered.  Risk Prescription drug management. Decision regarding hospitalization.  Patient has new onset atrial fibs with RVR.  He is moderately controlled with IV Cardizem and Lopressor.  Patient also has altered mental status.  CT scan unremarkable.  He will be admitted to medicine        Final Clinical Impression(s) / ED Diagnoses Final diagnoses:  Disorientation  Atrial fibrillation with RVR [facility])    Rx / DC Orders ED Discharge Orders     None         Bethann Berkshire, MD 01/19/23 2335

## 2023-01-20 ENCOUNTER — Ambulatory Visit: Payer: Medicare Other | Admitting: Internal Medicine

## 2023-01-20 ENCOUNTER — Encounter: Payer: Self-pay | Admitting: *Deleted

## 2023-01-20 ENCOUNTER — Inpatient Hospital Stay (HOSPITAL_COMMUNITY): Payer: Medicare Other

## 2023-01-20 ENCOUNTER — Ambulatory Visit: Payer: Self-pay | Admitting: *Deleted

## 2023-01-20 DIAGNOSIS — G7080 Lambert-Eaton syndrome, unspecified: Secondary | ICD-10-CM

## 2023-01-20 DIAGNOSIS — G9341 Metabolic encephalopathy: Secondary | ICD-10-CM | POA: Diagnosis not present

## 2023-01-20 DIAGNOSIS — I4891 Unspecified atrial fibrillation: Secondary | ICD-10-CM

## 2023-01-20 DIAGNOSIS — I739 Peripheral vascular disease, unspecified: Secondary | ICD-10-CM

## 2023-01-20 DIAGNOSIS — R7989 Other specified abnormal findings of blood chemistry: Secondary | ICD-10-CM

## 2023-01-20 DIAGNOSIS — M6282 Rhabdomyolysis: Secondary | ICD-10-CM

## 2023-01-20 LAB — CBC
HCT: 31.7 % — ABNORMAL LOW (ref 39.0–52.0)
Hemoglobin: 10.3 g/dL — ABNORMAL LOW (ref 13.0–17.0)
MCH: 23.2 pg — ABNORMAL LOW (ref 26.0–34.0)
MCHC: 32.5 g/dL (ref 30.0–36.0)
MCV: 71.4 fL — ABNORMAL LOW (ref 80.0–100.0)
Platelets: 277 10*3/uL (ref 150–400)
RBC: 4.44 MIL/uL (ref 4.22–5.81)
RDW: 14.5 % (ref 11.5–15.5)
WBC: 9.8 10*3/uL (ref 4.0–10.5)
nRBC: 0 % (ref 0.0–0.2)

## 2023-01-20 LAB — CBC WITH DIFFERENTIAL/PLATELET
Basophils Absolute: 0 10*3/uL (ref 0.0–0.1)
Basophils Relative: 0 %
Eosinophils Absolute: 0.3 10*3/uL (ref 0.0–0.5)
HCT: 30.6 % — ABNORMAL LOW (ref 39.0–52.0)
Hemoglobin: 10 g/dL — ABNORMAL LOW (ref 13.0–17.0)
Immature Granulocytes: 1 %
Platelets: 258 10*3/uL (ref 150–400)
RBC: 4.39 MIL/uL (ref 4.22–5.81)

## 2023-01-20 LAB — COMPREHENSIVE METABOLIC PANEL
ALT: 32 U/L (ref 0–44)
AST: 48 U/L — ABNORMAL HIGH (ref 15–41)
Albumin: 2.7 g/dL — ABNORMAL LOW (ref 3.5–5.0)
Alkaline Phosphatase: 53 U/L (ref 38–126)
Anion gap: 11 (ref 5–15)
BUN: 18 mg/dL (ref 8–23)
CO2: 21 mmol/L — ABNORMAL LOW (ref 22–32)
Calcium: 8 mg/dL — ABNORMAL LOW (ref 8.9–10.3)
Chloride: 109 mmol/L (ref 98–111)
Creatinine, Ser: 0.98 mg/dL (ref 0.61–1.24)
GFR, Estimated: >60 mL/min (ref >60)
Glucose, Bld: 105 mg/dL — ABNORMAL HIGH (ref 70–99)
Potassium: 3.6 mmol/L (ref 3.5–5.1)
Sodium: 141 mmol/L (ref 135–145)
Total Bilirubin: 1.7 mg/dL — ABNORMAL HIGH (ref 0.3–1.2)
Total Protein: 6.5 g/dL (ref 6.5–8.1)

## 2023-01-20 LAB — ECHOCARDIOGRAM COMPLETE
AR max vel: 1.64 cm2
AV Area VTI: 1.54 cm2
AV Area mean vel: 1.44 cm2
AV Mean grad: 4.7 mmHg
AV Peak grad: 8.7 mmHg
Ao pk vel: 1.48 m/s
Area-P 1/2: 3.99 cm2
Height: 75 in
MV M vel: 1.18 m/s
MV Peak grad: 5.6 mmHg
S' Lateral: 3.2 cm
Weight: 3245.17 oz

## 2023-01-20 LAB — CK
Total CK: 1377 U/L — ABNORMAL HIGH (ref 49–397)
Total CK: 1030 U/L — ABNORMAL HIGH (ref 49–397)

## 2023-01-20 LAB — TSH: TSH: 0.489 u[IU]/mL (ref 0.350–4.500)

## 2023-01-20 LAB — MRSA NEXT GEN BY PCR, NASAL: MRSA by PCR Next Gen: NOT DETECTED

## 2023-01-20 LAB — HEPARIN LEVEL (UNFRACTIONATED)
Heparin Unfractionated: 0.21 IU/mL — ABNORMAL LOW (ref 0.30–0.70)
Heparin Unfractionated: 0.24 IU/mL — ABNORMAL LOW (ref 0.30–0.70)

## 2023-01-20 LAB — GLUCOSE, CAPILLARY: Glucose-Capillary: 92 mg/dL (ref 70–99)

## 2023-01-20 LAB — MAGNESIUM: Magnesium: 2 mg/dL (ref 1.7–2.4)

## 2023-01-20 MED ORDER — DILTIAZEM HCL-DEXTROSE 125-5 MG/125ML-% IV SOLN (PREMIX): 5.0000 mg/h | INTRAVENOUS | Status: AC

## 2023-01-20 MED ORDER — SODIUM CHLORIDE 0.9 % IV SOLN: INTRAVENOUS | Status: AC

## 2023-01-20 MED ORDER — OXYCODONE HCL 5 MG PO TABS
5.0000 mg | ORAL_TABLET | ORAL | Status: DC | PRN
  Administered 2023-01-21: 5 mg via ORAL
  Filled 2023-01-20: qty 1

## 2023-01-20 MED ORDER — CHLORHEXIDINE GLUCONATE CLOTH 2 % EX PADS
6.0000 | MEDICATED_PAD | Freq: Every day | CUTANEOUS | Status: DC
  Administered 2023-01-20 – 2023-01-21 (×2): 6 via TOPICAL

## 2023-01-20 MED ORDER — HEPARIN BOLUS VIA INFUSION
1250.0000 [IU] | Freq: Once | INTRAVENOUS | Status: AC
  Administered 2023-01-20: 1250 [IU] via INTRAVENOUS
  Filled 2023-01-20: qty 1250

## 2023-01-20 MED ORDER — HEPARIN BOLUS VIA INFUSION
4000.0000 [IU] | Freq: Once | INTRAVENOUS | Status: AC
  Administered 2023-01-20: 4000 [IU] via INTRAVENOUS

## 2023-01-20 MED ORDER — ASPIRIN 81 MG PO TBEC
81.0000 mg | DELAYED_RELEASE_TABLET | Freq: Every day | ORAL | Status: DC
  Administered 2023-01-20 – 2023-01-21 (×2): 81 mg via ORAL
  Filled 2023-01-20 (×2): qty 1

## 2023-01-20 MED ORDER — TAMSULOSIN HCL 0.4 MG PO CAPS
0.4000 mg | ORAL_CAPSULE | Freq: Once | ORAL | Status: AC
  Administered 2023-01-20: 0.4 mg via ORAL
  Filled 2023-01-20: qty 1

## 2023-01-20 MED ORDER — ATORVASTATIN CALCIUM 40 MG PO TABS
40.0000 mg | ORAL_TABLET | Freq: Every day | ORAL | Status: DC
  Administered 2023-01-20: 40 mg via ORAL
  Filled 2023-01-20: qty 1

## 2023-01-20 MED ORDER — HEPARIN (PORCINE) 25000 UT/250ML-% IV SOLN
1600.0000 [IU]/h | INTRAVENOUS | Status: DC
  Administered 2023-01-20: 1300 [IU]/h via INTRAVENOUS
  Filled 2023-01-20 (×2): qty 250

## 2023-01-20 MED ORDER — ACETAMINOPHEN 325 MG PO TABS: 650.0000 mg | ORAL_TABLET | Freq: Four times a day (QID) | ORAL | Status: DC | PRN

## 2023-01-20 MED ORDER — ACETAMINOPHEN 650 MG RE SUPP: 650.0000 mg | Freq: Four times a day (QID) | RECTAL | Status: DC | PRN

## 2023-01-20 MED ORDER — TAMSULOSIN HCL 0.4 MG PO CAPS: 0.4000 mg | ORAL_CAPSULE | Freq: Every day | ORAL | Status: DC

## 2023-01-20 MED ORDER — ONDANSETRON HCL 4 MG/2ML IJ SOLN: 4.0000 mg | Freq: Four times a day (QID) | INTRAMUSCULAR | Status: DC | PRN

## 2023-01-20 MED ORDER — DILTIAZEM HCL 60 MG PO TABS
60.0000 mg | ORAL_TABLET | Freq: Four times a day (QID) | ORAL | Status: DC
  Administered 2023-01-20 – 2023-01-21 (×3): 60 mg via ORAL
  Filled 2023-01-20 (×3): qty 1

## 2023-01-20 MED ORDER — MIDODRINE HCL 5 MG PO TABS
5.0000 mg | ORAL_TABLET | Freq: Three times a day (TID) | ORAL | Status: DC
  Administered 2023-01-20 – 2023-01-21 (×6): 5 mg via ORAL
  Filled 2023-01-20 (×6): qty 1

## 2023-01-20 MED ORDER — AMIFAMPRIDINE PHOSPHATE 10 MG PO TABS
15.0000 mg | ORAL_TABLET | Freq: Three times a day (TID) | ORAL | Status: DC
  Administered 2023-01-20 – 2023-01-21 (×4): 15 mg via ORAL
  Filled 2023-01-20 (×2): qty 1

## 2023-01-20 MED ORDER — MORPHINE SULFATE (PF) 2 MG/ML IV SOLN: 2.0000 mg | INTRAVENOUS | Status: DC | PRN

## 2023-01-20 MED ORDER — ONDANSETRON HCL 4 MG PO TABS: 4.0000 mg | ORAL_TABLET | Freq: Four times a day (QID) | ORAL | Status: DC | PRN

## 2023-01-20 NOTE — Progress Notes (Signed)
ANTICOAGULATION CONSULT NOTE -  Pharmacy Consult for heparin Indication: atrial fibrillation  No Known Allergies  Patient Measurements: Height: 6\' 3"  (190.5 cm) Weight: 92 kg (202 lb 13.2 oz) IBW/kg (Calculated) : 84.5 Heparin Dosing Weight: 92 Kg  Vital Signs: Temp: 98.4 F (36.9 C) (06/12 1629) Temp Source: Oral (06/12 1629) BP: 133/60 (06/12 0700) Pulse Rate: 79 (06/12 0700)  Labs: Recent Labs    01/19/23 2045 01/19/23 2229 01/20/23 0500 01/20/23 0846 01/20/23 1807  HGB 10.4*  --  10.0* 10.3*  --   HCT 32.5*  --  30.6* 31.7*  --   PLT 237  --  258 277  --   HEPARINUNFRC  --   --   --  0.21* 0.24*  CREATININE 1.20  --  0.98  --   --   CKTOTAL  --  1,377* 1,030*  --   --   TROPONINIHS 86* 81*  --   --   --      Estimated Creatinine Clearance: 75.4 mL/min (by C-G formula based on SCr of 0.98 mg/dL).   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 No oral anticoagulation reported prior to admission. Hgb low 10.4 and PLT WNL. Pharmacy consulted to start heparin infusion. Hgb is stable, no bleeding noted Heparin level 0.24, subtherapeutic. No issues with infusion   Goal of Therapy:  Heparin level 0.3-0.7 units/ml Monitor platelets by anticoagulation protocol: Yes   Plan:  Give 1250 units bolus x 1 Increase  heparin infusion at 1600 units/hr Check anti-Xa level in 8 hours and daily while on heparin Continue to monitor H&H and platelets F/U transition to PO tx  Jeanella Cara, PharmD, [facility] Clinical Pharmacist Please see AMION for all Pharmacists' Contact Phone Numbers 01/20/2023, 6:46 PM

## 2023-01-20 NOTE — Progress Notes (Addendum)
Patient's daughter has brought the medicine FIRDAPSE(Amifampridine) 10 tabs, (1 packet) in the hospital as per ED doctor's request on admission, the patient has been taking this medicine for quite a long time now, and as per the ED Dr, the given medicine is not available in the hospital, Medicine kept in the patient's bin in the medication room, [facility] and Charge RN made aware. Will endorse to the oncoming RN

## 2023-01-20 NOTE — Assessment & Plan Note (Signed)
-  Continue amifampiridine

## 2023-01-20 NOTE — H&P (Signed)
History and Physical    Patient: Erik [name] ZOX:[id number] DOB: August 18, 1945 DOA: 01/19/2023 DOS: the patient was seen and examined on 01/20/2023 PCP: [name], MD  Patient coming from: Home  Chief Complaint:  Chief Complaint  Patient presents with   Altered Mental Status   HPI: Erik [name] is a 77 y.o. male with medical history significant of seizure disorder, anemia, Lambert-Eaton syndrome, and more presents the ED with a chief complaint of found down.  Patient reports he does not remember it.  It is reported that he was found on the floor but he reports that he may have down to sleep and then woke up in the hospital.  He reports he does not remember falling.  He did not have any chest pain, palpitations, dyspnea.  He has been a little more fatigued than normal per his report.  Patient has garbled speech that is difficult to understand.  He reports that this is his normal speech.  Patient has no other complaints at this time.  Patient does not smoke, does not drink.  He is vaccinated for COVID.  Patient is full code. Review of Systems: As mentioned in the history of present illness. All other systems reviewed and are negative. 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 Jennell Corner external and internal hemorrhoids, redundant left colon   ESOPHAGEAL DILATION N/A 11/12/2014   Procedure: ESOPHAGEAL DILATION;  Surgeon: West Bali, MD;  Location: [facility];  Service: Endoscopy;  Laterality: N/A;   ESOPHAGOGASTRODUODENOSCOPY N/A 11/12/2014   Dr. Fields:mild non-erosive gastritis. negative H.pylori    None     Social History:  reports that he quit smoking  about 39 years ago. His smoking use included cigarettes. He started smoking about 60 years ago. He has a 10.50 pack-year smoking history. He has been exposed to tobacco smoke. He has never used smokeless tobacco. He reports that he does not drink alcohol and does not use drugs.  No Known Allergies  Family History  Problem Relation Age of Onset   Dementia Mother    Crohn's disease Mother    Cancer Father    Sickle cell anemia Daughter    Stomach cancer Maternal Grandfather    Colon cancer Neg Hx     Prior to Admission medications   Medication Sig Start Date End Date Taking? Authorizing Provider  acetaminophen (TYLENOL) 500 MG tablet Take 500 mg by mouth every 6 (six) hours as needed.    [provider]  Amifampridine Phosphate 10 MG TABS Take 15 mg by mouth 3 (three) times daily.    [provider]  aspirin EC 81 MG tablet Take 81 mg by mouth daily.    [provider]  atorvastatin (LIPITOR) 40 MG tablet Take 1 tablet (40 mg total) by mouth daily. 11/09/22   Gardenia Phlegm, MD  Cholecalciferol (D3 5000) 125 MCG (5000 UT) capsule Take 5,000 Units by mouth daily.    [provider]  furosemide (LASIX) 40 MG tablet TAKE 1 TABLET BY MOUTH ON MONDAY, WEDNESDAY AND FRIDAY - MAY TAKE ADDITIONAL AS NEEDED FOR SWELLING. PT NEEDS APPT FOR FURTHER REFILLS 07/31/22  Antoine Poche, MD  midodrine (PROAMATINE) 5 MG tablet TAKE 1 TABLET BY MOUTH THREE TIMES DAILY WITH MEALS 12/01/22   Antoine Poche, MD  trolamine salicylate (ASPERCREME) 10 % cream Apply 1 application topically as needed for muscle pain.    [provider]  vitamin B-12 (CYANOCOBALAMIN) 1000 MCG tablet Take 1,000 mcg by mouth daily.    [provider]  Zinc 50 MG CAPS Take 50 mg by mouth every morning.    [provider]    Physical Exam: Vitals:   01/20/23 0200 01/20/23 0300 01/20/23 0400 01/20/23 0500  BP: (!) 114/45 (!) 121/46 (!) 138/59 (!) 135/59  Pulse: 80 79  81 87  Resp: 20 17 19 18   Temp:   99.5 F (37.5 C)   TempSrc:   Oral   SpO2: 92% 94% 96% 93%  Weight:      Height:       1.  General: Patient lying supine in bed,  no acute distress   2. Psychiatric: Alert and oriented person, place, context, mood and behavior normal for situation, pleasant and cooperative with exam   3. Neurologic: Speech is garbled and language is normal, chronic facial asymmetry, moves all 4 extremities voluntarily, at baseline without acute deficits on limited exam   4. HEENMT:  Head is atraumatic, normocephalic, pupils reactive to light, neck is supple, trachea is midline, mucous membranes are moist   5. Respiratory : Lungs are clear to auscultation bilaterally without wheezing, rhonchi, rales, no cyanosis, no increase in work of breathing or accessory muscle use   6. Cardiovascular : Heart rate normal, rhythm is regular, no rubs or gallops, no peripheral edema, peripheral pulses palpated   7. Gastrointestinal:  Abdomen is soft, nondistended, nontender to palpation bowel sounds active, no masses or organomegaly palpated   8. Skin:  Skin is warm, dry and intact without rashes, acute lesions, or ulcers on limited exam   9.Musculoskeletal:  No acute deformities or trauma, no asymmetry in tone, no peripheral edema, peripheral pulses palpated, no tenderness to palpation in the extremities  Data Reviewed: In the ED Temp 98, heart rate 110-171, respiratory rate 17-28, blood pressure 162/56-152/87, oxygen sat read from 79 to 94% but staff reports that the lower oxygen readings were without a good [place] No leukocytosis with white blood cell count of 10.8, hemoglobin 10.4 Creatinine is very close to baseline Troponin 86, 81>> downtrending Patient was in A-fib with RVR with heart rate up to 170s CT head and neck shows moderate degenerative disc disease and facet hypertrophy without acute fracture Chest x-ray shows mild left basilar atelectasis UA is negative  for UTI ED spoke with neurology who reports that patient's generalized weakness is likely related to nonadherence recently to the medical regimen for Lambert-Eaton syndrome Okay for patient to take medication for Lambert-Eaton syndrome from home supply Admission requested for A-fib with RVR   Assessment and Plan: * Atrial fibrillation with RVR (HCC) -HR 170s -20mg  Cardizem in ED -5mg  metoprolol given in ED as well -Started on cardizem drip -CHADSVASC 2  -Start heparin -New onset -Check TSH -Echo in the AM -trop mildly elevated - likely demand ischemia  -no signs of infection -Monitor on tele   Acute metabolic encephalopathy - Found down on welfare check - Reportedly confused at first - Patient mentation is improving he is oriented to person place and context - Confusion likely related to some dehydration and rhabdomyolysis - Continue to monitor  Rhabdomyolysis -Mild -CK 1,377 -Found  down -Continue IV hydration -Trend in the AM  Elevated troponin -Demand ischemia in the setting of afib with RVR -Down trending -EKG shows afib with RVR -Continue to monitor   PAD (peripheral artery disease) (HCC) -Continue statin -Continue aspirin  LEMS (Lambert-Eaton myasthenic syndrome) (HCC) -Continue amifampiridine      Advance Care Planning:   Code Status: Full Code  Consults: None at this time  Family Communication: No family at bedside  Severity of Illness: The appropriate patient status for this patient is INPATIENT. Inpatient status is judged to be reasonable and necessary in order to provide the required intensity of service to ensure the patient's safety. The patient's presenting symptoms, physical exam findings, and initial radiographic and laboratory data in the context of their chronic comorbidities is felt to place them at high risk for further clinical deterioration. Furthermore, it is not anticipated that the patient will be medically stable for discharge from  the hospital within 2 midnights of admission.   * I certify that at the point of admission it is my clinical judgment that the patient will require inpatient hospital care spanning beyond 2 midnights from the point of admission due to high intensity of service, high risk for further deterioration and high frequency of surveillance required.*  Author: Lilyan Gilford, DO 01/20/2023 5:50 AM  For on call review [email or web address].

## 2023-01-20 NOTE — Assessment & Plan Note (Signed)
-  Demand ischemia in the setting of afib with RVR -Down trending -EKG shows afib with RVR -Continue to monitor

## 2023-01-20 NOTE — Progress Notes (Signed)
  Echocardiogram 2D Echocardiogram has been performed.  Erik [name] 01/20/2023, 11:11 AM

## 2023-01-20 NOTE — Assessment & Plan Note (Signed)
-  HR 170s -20mg  Cardizem in ED -5mg  metoprolol given in ED as well -Started on cardizem drip -CHADSVASC 2  -Start heparin -New onset -Check TSH -Echo in the AM -trop mildly elevated - likely demand ischemia  -no signs of infection -Monitor on tele

## 2023-01-20 NOTE — TOC Initial Note (Signed)
Transition of Care [facility]) - Initial/Assessment Note    Patient Details  Name: Erik [name] MRN: [id number] Date of Birth: 10/20/1945  Transition of Care [facility]) CM/SW Contact:    [name], LCSW Phone Number: 01/20/2023, 3:26 PM  Clinical Narrative:                  Pt admitted from home. PT recommending SNF rehab at dc.  Met with pt at bedside today to assess. Per pt, he lives alone but his grandson lives nearby and assists as needed. Pt has a cane for ambulation outside of the home. Per pt, he is able to get to appointments (family drives him) and obtain his medications as needed.  Discussed PT recommendation with pt who states that he does not want to go to SNF rehab. Pt also states that he does not want HH therapy. Pt aware that TOC will discuss with his family as well.  Spoke with dtr by phone who expresses that she absolutely wants pt to go to SNF rehab. She states she will talk to him later today and TOC will follow up tomorrow. Will refer out for SNF if pt agreeable tomorrow.  Expected Discharge Plan: Skilled Nursing Facility Barriers to Discharge: Continued Medical Work up   Patient Goals and CMS Choice Patient states their goals for this hospitalization and ongoing recovery are:: get better CMS Medicare.gov Compare Post Acute Care list provided to:: Patient Choice offered to / list presented to : Patient [place] ownership interest in [facility].provided to:: Patient    Expected Discharge Plan and Services In-house Referral: Clinical Social Work   Post Acute Care Choice: Skilled Nursing Facility Living arrangements for the past 2 months: Single Family Home                                      Prior Living Arrangements/Services Living arrangements for the past 2 months: Single Family Home Lives with:: Self Patient language and need for interpreter reviewed:: Yes Do you feel safe going back to the place where you live?: Yes      Need  for Family Participation in Patient Care: Yes (Comment) Care giver support system in place?: Yes (comment) Current home services: DME Criminal Activity/Legal Involvement Pertinent to Current Situation/Hospitalization: No - Comment as needed  Activities of Daily Living Home Assistive Devices/Equipment: Cane (specify quad or straight) ADL Screening (condition at time of admission) Patient's cognitive ability adequate to safely complete daily activities?: No Is the patient deaf or have difficulty hearing?: No Does the patient have difficulty seeing, even when wearing glasses/contacts?: No Does the patient have difficulty concentrating, remembering, or making decisions?: Yes Patient able to express need for assistance with ADLs?: Yes Does the patient have difficulty dressing or bathing?: Yes Independently performs ADLs?: No Communication: Independent Dressing (OT): Independent Grooming: Independent with device (comment) Feeding: Independent Bathing: Independent with device (comment) Toileting: Independent with device (comment) In/Out Bed: Independent with device (comment) Walks in Home: Independent with device (comment) Does the patient have difficulty walking or climbing stairs?: Yes Weakness of Legs: Both Weakness of Arms/Hands: None  Permission Sought/Granted                  Emotional Assessment Appearance:: Appears stated age Attitude/Demeanor/Rapport: Engaged Affect (typically observed): Pleasant Orientation: : Oriented to Self, Oriented to Place Alcohol / Substance Use: Not Applicable Psych Involvement: No (comment)  Admission  diagnosis:  Disorientation [R41.0] Atrial fibrillation with RVR (HCC) [I48.91] Patient Active Problem List   Diagnosis Date Noted   Elevated troponin 01/20/2023   Rhabdomyolysis 01/20/2023   Acute metabolic encephalopathy 01/20/2023   Atrial fibrillation with RVR (HCC) 01/19/2023   Vitamin D deficiency 10/28/2022   Cognitive impairment  10/28/2022   PAD (peripheral artery disease) (HCC) 10/28/2022   Encounter for general adult medical examination with abnormal findings 10/28/2022   Prediabetes 10/28/2022   Iron deficiency anemia 06/04/2016   Microcytic anemia 06/25/2015   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   B12 deficiency 06/25/2015   Loss of weight 10/26/2014   Dysphagia, pharyngoesophageal phase 10/26/2014   Low back pain 07/10/2014   Orthostatic hypotension 07/07/2014   Orthostasis 07/07/2014   PCP:  [name], MD Pharmacy:   [facility] [address] [place] - [address] [place] [address] Phone: [phone] Fax: [phone]     Social Determinants of Health (SDOH) Social History: SDOH Screenings   Food Insecurity: No Food Insecurity (01/20/2023)  Housing: Patient Declined (01/20/2023)  Transportation Needs: No Transportation Needs (01/20/2023)  Utilities: Not At Risk (01/20/2023)  Alcohol Screen: Low Risk  (12/10/2022)  Depression (PHQ2-9): Low Risk  (12/10/2022)  Financial Resource Strain: Low Risk  (12/11/2022)  Physical Activity: Inactive (12/11/2022)  Social Connections: Socially Isolated (12/11/2022)  Stress: Patient Unable To Answer (12/11/2022)  Tobacco Use: Medium Risk (01/20/2023)   SDOH Interventions:     Readmission Risk Interventions     No data to display

## 2023-01-20 NOTE — Plan of Care (Signed)
  Problem: Acute Rehab PT Goals(only PT should resolve) Goal: Pt Will Go Supine/Side To Sit Outcome: Progressing Flowsheets (Taken 01/20/2023 1330) Pt will go Supine/Side to Sit: with supervision Goal: Pt Will Go Sit To Supine/Side Outcome: Progressing Flowsheets (Taken 01/20/2023 1330) Pt will go Sit to Supine/Side: with supervision Goal: Patient Will Transfer Sit To/From Stand Outcome: Progressing Flowsheets (Taken 01/20/2023 1330) Patient will transfer sit to/from stand: with min guard assist Goal: Pt Will Transfer Bed To Chair/Chair To Bed Outcome: Progressing Flowsheets (Taken 01/20/2023 1330) Pt will Transfer Bed to Chair/Chair to Bed: min guard assist Goal: Pt Will Ambulate Outcome: Progressing Flowsheets (Taken 01/20/2023 1330) Pt will Ambulate:  50 feet  with min guard assist  with least restrictive assistive device Goal: Pt Will Go Up/Down Stairs Outcome: Progressing Flowsheets (Taken 01/20/2023 1330) Pt will Go Up / Down Stairs:  3-5 stairs  with min guard assist  with rail(s)  with least restrictive assistive device   Britta Mccreedy D. Hartnett-Rands, MS, PT Per Diem PT [facility] [phone] 01/20/2023

## 2023-01-20 NOTE — Progress Notes (Addendum)
PROGRESS NOTE     Erik [name], is a 77 y.o. male, DOB - [birth year], ZOX:[id number]  Admit date - 01/19/2023   Admitting Physician Lilyan Gilford, DO  Outpatient Primary MD for the patient is [name], MD  LOS - 1  Chief Complaint  Patient presents with   Altered Mental Status        Brief Narrative:   77 y.o. male with medical history significant of seizure disorder, chronic iron deficiency anemia, Lambert-Eaton syndrome and PAD admitted on 01/20/2023 with acute metabolic encephalopathy and A-fib with RVR after being found on the floor during a welfare call    -Assessment and Plan: 1) presumed new onset atrial fibrillation with RVR ----POA --TSH--WNL -Echo pending -trop mildly elevated - likely demand ischemia  -no signs of infection -Continue IV heparin and IV Cardizem pending echo data/EF switched to p.o. Cardizem or p.o. metoprolol and DOAC  2)Acute metabolic encephalopathy --Patient's daughter [name] is at bedside states that mentation is improved but not back to baseline yet  3)Rhabdomyolysis---patient was found down in the house during a welfare call unclear how long he has been on the floor CK 1,377--trending down with hydration -Continue IV fluids  4)Elevated troponin -Demand ischemia in the setting of afib with RVR -Down trending -EKG shows afib with RVR -Echo pending   5)PAD (peripheral artery disease) (HCC) -Continue atorvastatin and aspirin  6)LEMS (Lambert-Eaton myasthenic syndrome) (HCC) -Continue amifampiridine  7) status post falL/generalized weakness and ambulatory dysfunction--eval requested  8) chronic anemia--- Hgb may drop with hydration -No bleeding concerns at this time -\ Total care time 56 minutes  Status is: Inpatient   Disposition: The patient is from: Home              Anticipated d/c is to:  Pending physical therapy eval              Anticipated d/c date is: 1 day              Patient currently is not  medically stable to d/c. Barriers: Not Clinically Stable-   Code Status :  -  Code Status: Full Code   Family Communication:   -Discussed with daughter Erik [name] at bedside DVT Prophylaxis  :   - SCDs   heparin bolus via infusion 1,250 Units Start: 01/20/23 1115 SCDs Start: 01/20/23 0036   Lab Results  Component Value Date   PLT 277 01/20/2023    Inpatient Medications  Scheduled Meds:  Amifampridine Phosphate  15 mg Oral TID   aspirin EC  81 mg Oral Daily   atorvastatin  40 mg Oral Daily   Chlorhexidine Gluconate Cloth  6 each Topical Q0600   heparin  1,250 Units Intravenous Once   midodrine  5 mg Oral TID WC   [START ON 01/21/2023] tamsulosin  0.4 mg Oral QPC supper   Continuous Infusions:  sodium chloride 150 mL/hr at 01/20/23 0650   diltiazem (CARDIZEM) infusion 5 mg/hr (01/20/23 0650)   heparin 1,450 Units/hr (01/20/23 1027)   PRN Meds:.acetaminophen **OR** acetaminophen, morphine injection, ondansetron **OR** ondansetron (ZOFRAN) IV, oxyCODONE   Anti-infectives (From admission, onward)    None         Subjective: Erik [name] today has no fevers, no emesis,  No chest pain,   - Resting comfortably daughter Erik [name] at bedside Mentation apparently is not back to baseline yet - Total care time 56 minutes  Objective: Vitals:   01/20/23 0500 01/20/23 0600 01/20/23 0700 01/20/23 0733  BP: (!) 135/59 (!) 129/51 133/60   Pulse: 87 76 79   Resp: 18 19 (!) 23   Temp:    98.2 F (36.8 C)  TempSrc:    Oral  SpO2: 93% 92% 95%   Weight:      Height:        Intake/Output Summary (Last 24 hours) at 01/20/2023 1028 Last data filed at 01/20/2023 0920 Gross per 24 hour  Intake 1140.54 ml  Output 800 ml  Net 340.54 ml   Filed Weights   01/19/23 2028  Weight: 92 kg    Physical Exam  Gen:- Awake Alert,  in no apparent distress  HEENT:- [place].AT, No sclera icterus Neck-Supple Neck,No JVD,.  Lungs-  CTAB , fair symmetrical air movement CV- S1, S2  normal, irregular  Abd-  +ve B.Sounds, Abd Soft, No tenderness,    Extremity/Skin:- No  edema, pedal pulses present  Psych-affect is appropriate, oriented x2, as per daughter at bedside mentation is not back to baseline yet Neuro-generalized weakness, no new focal deficits, no tremors GU-Foley removed 01/20/2023  Data Reviewed: I have personally reviewed following labs and imaging studies  CBC: Recent Labs  Lab 01/19/23 2045 01/20/23 0500 01/20/23 0846  WBC 10.8* 9.8 9.8  NEUTROABS 8.4* 6.9  --   HGB 10.4* 10.0* 10.3*  HCT 32.5* 30.6* 31.7*  MCV 71.4* 69.7* 71.4*  PLT 237 258 277   Basic Metabolic Panel: Recent Labs  Lab 01/19/23 2045 01/20/23 0500  NA 141 141  K 3.5 3.6  CL 105 109  CO2 22 21*  GLUCOSE 102* 105*  BUN 21 18  CREATININE 1.20 0.98  CALCIUM 8.6* 8.0*  MG  --  2.0   GFR: Estimated Creatinine Clearance: 75.4 mL/min (by C-G formula based on SCr of 0.98 mg/dL). Liver Function Tests: Recent Labs  Lab 01/19/23 2045 01/20/23 0500  AST 64* 48*  ALT 38 32  ALKPHOS 67 53  BILITOT 2.0* 1.7*  PROT 7.8 6.5  ALBUMIN 3.1* 2.7*   Cardiac Enzymes: Recent Labs  Lab 01/19/23 2229 01/20/23 0500  CKTOTAL 1,377* 1,030*   Recent Results (from the past 240 hour(s))  MRSA Next Gen by PCR, Nasal     Status: None   Collection Time: 01/20/23 12:18 AM   Specimen: Nasal Mucosa; Nasal Swab  Result Value Ref Range Status   MRSA by PCR Next Gen NOT DETECTED NOT DETECTED Final    Comment: (NOTE) The GeneXpert MRSA Assay (FDA approved for NASAL specimens only), is one component of a comprehensive MRSA colonization surveillance program. It is not intended to diagnose MRSA infection nor to guide or monitor treatment for MRSA infections. Test performance is not FDA approved in patients less than 57 years old. Performed at [facility], [address], [place], [place] [address]     Radiology Studies: CT Cervical Spine Wo Contrast  Result Date: 01/19/2023 CLINICAL  DATA:  Ataxia, cervical trauma EXAM: CT CERVICAL SPINE WITHOUT CONTRAST TECHNIQUE: Multidetector CT imaging of the cervical spine was performed without intravenous contrast. Multiplanar CT image reconstructions were also generated. RADIATION DOSE REDUCTION: This exam was performed according to the departmental dose-optimization program which includes automated exposure control, adjustment of the mA and/or kV according to patient size and/or use of iterative reconstruction technique. COMPARISON:  None Available. FINDINGS: Alignment: No traumatic subluxation. Trace anterolisthesis of C2 on C3 and retrolisthesis of C5 on C6 likely degenerative. Skull base and vertebrae: No acute fracture. Vertebral body heights are maintained. The dens and skull  base are intact. Soft tissues and spinal canal: No prevertebral fluid or swelling. No visible canal hematoma. Disc levels: Moderate degenerative disc disease and facet hypertrophy. No high-grade canal stenosis. Upper chest: Emphysema. No acute finding. Other: None. IMPRESSION: Moderate degenerative disc disease and facet hypertrophy without acute fracture or subluxation. Electronically Signed   By: Narda Rutherford M.D.   On: 01/19/2023 23:06   CT Head Wo Contrast  Result Date: 01/19/2023 CLINICAL DATA:  Head trauma, intracranial arterial injury suspected Altered mental status. EXAM: CT HEAD WITHOUT CONTRAST TECHNIQUE: Contiguous axial images were obtained from the base of the skull through the vertex without intravenous contrast. RADIATION DOSE REDUCTION: This exam was performed according to the departmental dose-optimization program which includes automated exposure control, adjustment of the mA and/or kV according to patient size and/or use of iterative reconstruction technique. COMPARISON:  None Available. FINDINGS: Brain: No intracranial hemorrhage, mass effect, or midline shift. Brain volume is normal for age. No hydrocephalus. The basilar cisterns are patent. Mild  periventricular chronic small vessel ischemia. Punctate lacunar infarct versus prominent perivascular space in the left basal ganglia. No evidence of territorial infarct or acute ischemia. No extra-axial or intracranial fluid collection. Vascular: Atherosclerosis of skullbase vasculature without hyperdense vessel or abnormal calcification. Skull: No fracture or focal lesion. Sinuses/Orbits: Paranasal sinuses and mastoid air cells are clear. The visualized orbits are unremarkable. Bilateral cataract resection. Other: None. IMPRESSION: 1. No acute intracranial abnormality. 2. Mild chronic small vessel ischemia. Electronically Signed   By: Narda Rutherford M.D.   On: 01/19/2023 23:03   DG Chest Port 1 View  Result Date: 01/19/2023 CLINICAL DATA:  Patient found on the floor. EXAM: PORTABLE CHEST 1 VIEW COMPARISON:  November 15, 2015 FINDINGS: The heart size and mediastinal contours are within normal limits. Mild, diffuse, chronic appearing increased interstitial lung markings are seen. Mild atelectasis is noted within the left lung base. There is no evidence of a pleural effusion or pneumothorax. Multilevel degenerative changes are seen throughout the thoracic spine. IMPRESSION: Mild left basilar atelectasis. Electronically Signed   By: Aram Candela M.D.   On: 01/19/2023 22:17   DG Pelvis Portable  Result Date: 01/19/2023 CLINICAL DATA:  Patient found on the floor. EXAM: PORTABLE PELVIS 1-2 VIEWS COMPARISON:  None Available. FINDINGS: There is no evidence of pelvic fracture or diastasis. No pelvic bone lesions are seen. IMPRESSION: Negative. Electronically Signed   By: Aram Candela M.D.   On: 01/19/2023 22:16     Scheduled Meds:  Amifampridine Phosphate  15 mg Oral TID   aspirin EC  81 mg Oral Daily   atorvastatin  40 mg Oral Daily   Chlorhexidine Gluconate Cloth  6 each Topical Q0600   heparin  1,250 Units Intravenous Once   midodrine  5 mg Oral TID WC   [START ON 01/21/2023] tamsulosin  0.4 mg  Oral QPC supper   Continuous Infusions:  sodium chloride 150 mL/hr at 01/20/23 0650   diltiazem (CARDIZEM) infusion 5 mg/hr (01/20/23 0650)   heparin 1,450 Units/hr (01/20/23 1027)     LOS: 1 day    Shon Hale M.D on 01/20/2023 at 10:28 AM  Go to [email or web address] - for contact info  [facility] - Office  [phone]  If 7PM-7AM, please contact night-coverage [email or web address] 01/20/2023, 10:28 AM

## 2023-01-20 NOTE — Evaluation (Signed)
Physical Therapy Evaluation Patient Details Name: Erik [name] MRN: [id number] DOB: 1946-08-04 Today's Date: 01/20/2023  History of Present Illness  Erik [name] is a 77 y.o. male with medical history significant of seizure disorder, anemia, Lambert-Eaton syndrome, and more presents the ED with a chief complaint of found down.  Patient reports he does not remember it.  It is reported that he was found on the floor but he reports that he may have down to sleep and then woke up in the hospital.  He reports he does not remember falling.  He did not have any chest pain, palpitations, dyspnea.  He has been a little more fatigued than normal per his report.  Patient has garbled speech that is difficult to understand.  He reports that this is his normal speech.  Patient has no other complaints at this time.   Clinical Impression  Pt admitted with above diagnosis. Patient eating his lunch with grandson present when therapist arrived. Patient agreeable to an evaluation today. Patient apprehensive and cautious with movement with regard to his multiple leads and lines. Patient required re-direction to attend to task at hand. Patient required extra time for bed mobility and transfers to get ready for the movement. Patient required extra attempts to complete power up for sit to stand transfers. Ambulation attempted with IV pole but patient reaching for objects along the way while walking around the bottom of his hospital bed. Patient required assistance for line and IV pole management. Patient would likely perform ambulation and transfers more steadily and safely with a RW but will need training on step sequencing and hand placement. Grandson reports patient is not yet functioning at his baseline mentation. Patient lives alone and has 4 stairs to enter his home. Patient admits to forgetting to eat and take his medicines on the day of his hospital admission. Pt currently with functional limitations due to  the deficits listed below (see PT Problem List). Pt will benefit from acute skilled PT to increase their independence and safety with mobility to allow discharge.          Recommendations for follow up therapy are one component of a multi-disciplinary discharge planning process, led by the attending physician.  Recommendations may be updated based on patient status, additional functional criteria and insurance authorization.  Follow Up Recommendations Can patient physically be transported by private vehicle: Yes     Assistance Recommended at Discharge Frequent or constant Supervision/Assistance  Patient can return home with the following  A little help with walking and/or transfers;A lot of help with bathing/dressing/bathroom;Assistance with cooking/housework;Assist for transportation;Help with stairs or ramp for entrance    Equipment Recommendations Rolling walker (2 wheels);BSC/3in1  Recommendations for Other Services       Functional Status Assessment Patient has had a recent decline in their functional status and demonstrates the ability to make significant improvements in function in a reasonable and predictable amount of time.     Precautions / Restrictions Precautions Precautions: Fall Precaution Comments: presumed fall prior to admission Restrictions Weight Bearing Restrictions: No      Mobility  Bed Mobility Overal bed mobility: Modified Independent   General bed mobility comments: extra time in unfamiliar environment with multiple leads and lines in ICU room    Transfers Overall transfer level: Needs assistance Equipment used: Ambulation equipment used Transfers: Sit to/from Stand, Bed to chair/wheelchair/BSC Sit to Stand: Min guard   Step pivot transfers: Min guard, Min assist   Anterior-Posterior transfers: Min guard  General transfer comment: min assist for line management in unfamiliar environment.    Ambulation/Gait Ambulation/Gait assistance: Min  Chemical engineer (Feet): 15 Feet Assistive device: IV Pole Gait Pattern/deviations: Step-through pattern, Decreased step length - right, Decreased step length - left, Decreased stride length, Trunk flexed, Shuffle, Wide base of support Gait velocity: decreased     General Gait Details: slow, labored cadence using IV pole and reaching for objects to steady himself and gait deviations noted above; limited by fatigue; on room air throughout  Stairs        Wheelchair Mobility    Modified Rankin (Stroke Patients Only)       Balance Overall balance assessment: Needs assistance Sitting-balance support: No upper extremity supported, Feet supported Sitting balance-Leahy Scale: Good     Standing balance support: During functional activity, Reliant on assistive device for balance, Bilateral upper extremity supported Standing balance-Leahy Scale: Fair Standing balance comment: fair with RW; fair/poor without upper extremity support         Pertinent Vitals/Pain Pain Assessment Pain Assessment: 0-10 Pain Score: 0-No pain    Home Living Family/patient expects to be discharged to:: Other (Comment) (family still not decided) Living Arrangements: Alone (history verfied by grandson, Darius, who was present for subjective portion of eval.) Available Help at Discharge: Family;Available PRN/intermittently Type of Home: House Home Access: Stairs to enter Entrance Stairs-Rails: Right Entrance Stairs-Number of Steps: 4   Home Layout: Two level;Able to live on main level with bedroom/bathroom Home Equipment: Cane - single point      Prior Function Prior Level of Function : Needs assist;History of Falls (last six months)       Physical Assist : ADLs (physical)   ADLs (physical): IADLs Mobility Comments: reports using no assistive device usually and community distances ADLs Comments: independent BADLs; assistance for IADLs; does not drive     Hand Dominance   Dominant Hand:  Right    Extremity/Trunk Assessment   Upper Extremity Assessment Upper Extremity Assessment: Overall WFL for tasks assessed    Lower Extremity Assessment Lower Extremity Assessment: Overall WFL for tasks assessed    Cervical / Trunk Assessment Cervical / Trunk Assessment: Normal  Communication   Communication: No difficulties  Cognition Arousal/Alertness: Awake/alert Behavior During Therapy: WFL for tasks assessed/performed Overall Cognitive Status: Impaired/Different from baseline Area of Impairment: Attention          General Comments      Exercises     Assessment/Plan    PT Assessment Patient needs continued PT services  PT Problem List Decreased balance;Decreased cognition;Decreased mobility;Decreased knowledge of use of DME;Decreased activity tolerance       PT Treatment Interventions DME instruction;Balance training;Patient/family education;Gait training;Therapeutic activities;Stair training;Therapeutic exercise;Cognitive remediation    PT Goals (Current goals can be found in the Care Plan section)  Acute Rehab PT Goals Patient Stated Goal: Go home. PT Goal Formulation: With patient Time For Goal Achievement: 02/03/23 Potential to Achieve Goals: Fair    Frequency Min 3X/week        AM-PAC PT "6 Clicks" Mobility  Outcome Measure Help needed turning from your back to your side while in a flat bed without using bedrails?: None Help needed moving from lying on your back to sitting on the side of a flat bed without using bedrails?: A Little Help needed moving to and from a bed to a chair (including a wheelchair)?: A Little Help needed standing up from a chair using your arms (e.g., wheelchair or bedside chair)?: A Little Help  needed to walk in hospital room?: A Lot Help needed climbing 3-5 steps with a railing? : A Lot 6 Click Score: 17    End of Session   Activity Tolerance: Patient tolerated treatment well;Patient limited by fatigue Patient left: in  chair;with call bell/phone within reach;with chair alarm set Nurse Communication: Mobility status PT Visit Diagnosis: Unsteadiness on feet (R26.81);History of falling (Z91.81);Other abnormalities of gait and mobility (R26.89);Dizziness and giddiness (R42)    Time: 2956-2130 PT Time Calculation (min) (ACUTE ONLY): 24 min   Charges:   PT Evaluation $PT Eval Low Complexity: 1 Low PT Treatments $Therapeutic Activity: 8-22 mins        Katina Dung. Hartnett-Rands, MS, PT [facility] [place] [phone]  Britta Mccreedy  Hartnett-Rands 01/20/2023, 1:24 PM

## 2023-01-20 NOTE — Progress Notes (Signed)
ANTICOAGULATION CONSULT NOTE - Initial Consult  Pharmacy Consult for heparin Indication: atrial fibrillation  No Known Allergies  Patient Measurements: Height: 6\' 2"  (188 cm) Weight: 92 kg (202 lb 13.2 oz) IBW/kg (Calculated) : 82.2 Heparin Dosing Weight: 92 Kg  Vital Signs: Temp: 98 F (36.7 C) (06/11 2031) Temp Source: Oral (06/11 2031) BP: 125/65 (06/11 2345) Pulse Rate: 80 (06/11 2345)  Labs: Recent Labs    01/19/23 2045 01/19/23 2229  HGB 10.4*  --   HCT 32.5*  --   PLT 237  --   CREATININE 1.20  --   CKTOTAL  --  1,377*  TROPONINIHS 86* 81*    Estimated Creatinine Clearance: 59.9 mL/min (by C-G formula based on SCr of 1.2 mg/dL).   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 No oral anticoagulation reported prior to admission. Hgb low 10.4 and PLT WNL. Pharmacy consulted to start heparin infusion.   Goal of Therapy:  Heparin level 0.3-0.7 units/ml Monitor platelets by anticoagulation protocol: Yes   Plan:  Give 4000 units bolus x 1 Start heparin infusion at 1300 units/hr Check anti-Xa level in 8 hours and daily while on heparin Continue to monitor H&H and platelets  Ruben Im, PharmD Clinical Pharmacist 01/20/2023 12:16 AM Please check AMION for all [facility] Pharmacy numbers

## 2023-01-20 NOTE — Progress Notes (Addendum)
ANTICOAGULATION CONSULT NOTE -  Pharmacy Consult for heparin Indication: atrial fibrillation  No Known Allergies  Patient Measurements: Height: 6\' 3"  (190.5 cm) Weight: 92 kg (202 lb 13.2 oz) IBW/kg (Calculated) : 84.5 Heparin Dosing Weight: 92 Kg  Vital Signs: Temp: 98.2 F (36.8 C) (06/12 0733) Temp Source: Oral (06/12 0733) BP: 133/60 (06/12 0700) Pulse Rate: 79 (06/12 0700)  Labs: Recent Labs    01/19/23 2045 01/19/23 2229 01/20/23 0500 01/20/23 0846  HGB 10.4*  --  10.0* 10.3*  HCT 32.5*  --  30.6* 31.7*  PLT 237  --  258 277  HEPARINUNFRC  --   --   --  0.21*  CREATININE 1.20  --  0.98  --   CKTOTAL  --  1,377* 1,030*  --   TROPONINIHS 86* 81*  --   --      Estimated Creatinine Clearance: 75.4 mL/min (by C-G formula based on SCr of 0.98 mg/dL).   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 No oral anticoagulation reported prior to admission. Hgb low 10.4 and PLT WNL. Pharmacy consulted to start heparin infusion. Hgb is stable, no bleeding noted Heparin level 0.21, subtherapeutic. No issues with infusion per RN  Goal of Therapy:  Heparin level 0.3-0.7 units/ml Monitor platelets by anticoagulation protocol: Yes   Plan:  Give 1250 units bolus x 1 Increase  heparin infusion at 1450 units/hr Check anti-Xa level in 8 hours and daily while on heparin Continue to monitor H&H and platelets F/U transition to PO tx  Elder Cyphers, BS Pharm D, BCPS Clinical Pharmacist 01/20/2023 10:17 AM

## 2023-01-20 NOTE — Assessment & Plan Note (Addendum)
Continue statin. Continue aspirin.  

## 2023-01-20 NOTE — Progress Notes (Signed)
The patient daughter  mentioned that she  don't want any visitors except her son and her husband and somebody who's accompanying them, All the visitors will have to know the patient password, password maintained in the chart, will endorse to the oncoming RN, charge nurse made aware, patient is confused,   patient bed in the lowest position, call bell within reach, family updated about the patient's disease condition , will continue to monitor.

## 2023-01-20 NOTE — Patient Instructions (Signed)
Visit Information  Thank you for taking time to visit with me today. Please don't hesitate to contact me if I can be of assistance to you.   Following are the goals we discussed today:   Goals Addressed             This Visit's Progress    Receive Assistance Applying for Personal Care Services.   On track    Care Coordination Interventions:  Interventions Today    Flowsheet Row Most Recent Value  Chronic Disease   Chronic disease during today's visit Other  [Inability to Perform Activities of Daily Living Independently, Prediabetes, Cognitive Impairment & Pharyngoesophageal Phase Dysphagia]  General Interventions   General Interventions Discussed/Reviewed General Interventions Discussed, Labs, Vaccines, Doctor Visits, Health Screening, General Interventions Reviewed, Annual Eye Exam, Durable Medical Equipment (DME), Community Resources, Level of Care, Communication with  [Encouraged]  Labs Hgb A1c annually  [Encouraged]  Vaccines COVID-19, Flu, Pneumonia, RSV, Shingles, Tetanus/Pertussis/Diphtheria  [Encouraged]  Doctor Visits Discussed/Reviewed Doctor Visits Discussed, Specialist, Doctor Visits Reviewed, Annual Wellness Visits, PCP  [Encouraged]  Health Screening Bone Density, Colonoscopy, Prostate  [Encouraged]  PCP/Specialist Visits Compliance with follow-up visit  [Encouraged]  Communication with PCP/Specialists, RN  Level of Care Adult Daycare, Personal Care Services, Applications, Assisted Living, Skilled Nursing Facility  [Encouraged]  Applications Personal Care Services  [Encouraged]  Exercise Interventions   Exercise Discussed/Reviewed Exercise Discussed, Assistive device use and maintanence, Exercise Reviewed, Physical Activity  [Encouraged]  Physical Activity Discussed/Reviewed Physical Activity Discussed, Home Exercise Program (HEP), Physical Activity Reviewed, Types of exercise  [Encouraged]  Education Interventions   Education Provided Provided Therapist, sports,  Provided Web-based Education, Provided Education  Provided Verbal Education On Development worker, community, [employer], When to see the doctor, Mental Health/Coping with Illness, Nutrition, Eye Care, Labs, Applications, Exercise, Medication  [Encouraged]  Applications Personal Care Services  [Encouraged]  Mental Health Interventions   Mental Health Discussed/Reviewed Mental Health Discussed, Anxiety, Depression, Grief and Loss, Substance Abuse, Suicide, Other, Crisis, Coping Strategies, Mental Health Reviewed  [Domestic Violence]  Nutrition Interventions   Nutrition Discussed/Reviewed Nutrition Discussed, Adding fruits and vegetables, Increaing proteins, Decreasing fats, Decreasing salt, Nutrition Reviewed, Carbohydrate meal planning, Decreasing sugar intake, Supplmental nutrition, Portion sizes, Fluid intake  [Encouraged]  Pharmacy Interventions   Pharmacy Dicussed/Reviewed Pharmacy Topics Discussed, Pharmacy Topics Reviewed, Medication Adherence, Affording Medications  [Encouraged]  Safety Interventions   Safety Discussed/Reviewed Safety Discussed, Safety Reviewed, Fall Risk, Home Safety  [Encouraged]  Home Safety Assistive Devices, Need for home safety assessment, Refer for community resources  [Encouraged]  Advanced Directive Interventions   Advanced Directives Discussed/Reviewed Advanced Directives Discussed  [Encouraged]     Active Listening & Reflection Utilized.  Verbalization of Feelings Encouraged.  Emotional Support Provided. Feelings of Caregiver Burnout Validated. Caregiver Stress Acknowledged. Caregiver Resources Reviewed. Problem Solving Interventions Activated. Task-Centered Solutions Implemented.   Solution-Focused Strategies Employed. Acceptance & Commitment Therapy Performed. Cognitive Behavioral Therapy Initiated. Client-Centered Therapy Indicated. CSW Collaboration with Daughter, [name] to Confirm Admission to [facility], on 01/19/2023.  CSW  Collaboration with Daughter, [name] to Confirm Interest in Pursuing Higher Level of Care Placement, Preferably in Skilled Nursing Facility to [employer] [employer], Upon Medical Clearance & Discharge from [facility]. CSW Collaboration with [name], Transitions of Care Social Worker with [facility], Via Secure Chat Message in [place], to Confirm Daughter, [name]'s Interest in Placement in Skilled Nursing Facility to [employer], Upon Medical  Clearance & Discharge from [facility]. CSW Collaboration with Daughter, [name] to [employer] with CSW [phone]), if She Has Questions, Needs Assistance, or If Additional Social Work Needs Are Identified Between Now & Our Next Scheduled Follow-Up Outreach Call.      Our next appointment is by telephone on 02/03/2023 at 1:45 pm.  Please call the care guide team at [phone] if you need to cancel or reschedule your appointment.   If you are experiencing a Mental Health or Behavioral Health Crisis or need someone to talk to, please call the Suicide and Crisis Lifeline: 988 call the [place] National Suicide Prevention Lifeline: 315-453-1925 or TTY: 4703055706 TTY 331-405-8967) to talk to a trained counselor call 1-800-273-TALK (toll free, 24 hour hotline) go to [facility] [address] [place] [phone]) call the [facility] Crisis Line: (614) 368-7773 call 911  Patient verbalizes understanding of instructions and care plan provided today and agrees to view in MyChart. Active MyChart status and patient understanding of how to access instructions and care plan via MyChart confirmed with patient.     Telephone follow up appointment with care management team member scheduled for:  02/03/2023 at 1:45 pm.  Danford Bad, BSW, MSW, LCSW  Licensed  Clinical Social Worker  [facility]  [place]. [address], [place], [place] [address] Physical Address-[address], [place], [place] [address] Toll Free Main # [phone] Fax # [phone] Cell # [phone] [email or web address]

## 2023-01-20 NOTE — Assessment & Plan Note (Signed)
-  Mild -CK 1,377 -Found down -Continue IV hydration -Trend in the AM

## 2023-01-20 NOTE — Assessment & Plan Note (Signed)
-   Found down on welfare check - Reportedly confused at first - Patient mentation is improving he is oriented to person place and context - Confusion likely related to some dehydration and rhabdomyolysis - Continue to monitor

## 2023-01-20 NOTE — Patient Outreach (Signed)
Care Coordination   Follow Up Visit Note   01/20/2023  Name: Erik [name] MRN: [id number] DOB: 08-01-1946  Erik [name] is a 77 y.o. year old male who sees [name], [name], MD for primary care. I spoke with patient's daughter, [name] by phone today.  What matters to the patients health and wellness today?  Receive Assistance Applying for Personal Care Services.   Goals Addressed             This Visit's Progress    Receive Assistance Applying for Personal Care Services.   On track    Care Coordination Interventions:  Interventions Today    Flowsheet Row Most Recent Value  Chronic Disease   Chronic disease during today's visit Other  [Inability to Perform Activities of Daily Living Independently, Prediabetes, Cognitive Impairment & Pharyngoesophageal Phase Dysphagia]  General Interventions   General Interventions Discussed/Reviewed General Interventions Discussed, Labs, Vaccines, Doctor Visits, Health Screening, General Interventions Reviewed, Annual Eye Exam, Durable Medical Equipment (DME), Community Resources, Level of Care, Communication with  [Encouraged]  Labs Hgb A1c annually  [Encouraged]  Vaccines COVID-19, Flu, Pneumonia, RSV, Shingles, Tetanus/Pertussis/Diphtheria  [Encouraged]  Doctor Visits Discussed/Reviewed Doctor Visits Discussed, Specialist, Doctor Visits Reviewed, Annual Wellness Visits, PCP  [Encouraged]  Health Screening Bone Density, Colonoscopy, Prostate  [Encouraged]  PCP/Specialist Visits Compliance with follow-up visit  [Encouraged]  Communication with PCP/Specialists, RN  Level of Care Adult Daycare, Personal Care Services, Applications, Assisted Living, Skilled Nursing Facility  [Encouraged]  Applications Personal Care Services  [Encouraged]  Exercise Interventions   Exercise Discussed/Reviewed Exercise Discussed, Assistive device use and maintanence, Exercise Reviewed, Physical Activity  [Encouraged]  Physical Activity  Discussed/Reviewed Physical Activity Discussed, Home Exercise Program (HEP), Physical Activity Reviewed, Types of exercise  [Encouraged]  Education Interventions   Education Provided Provided Therapist, sports, Provided Web-based Education, Provided Education  Provided Verbal Education On Development worker, community, [employer], When to see the doctor, Mental Health/Coping with Illness, Nutrition, Eye Care, Labs, Applications, Exercise, Medication  [Encouraged]  Applications Personal Care Services  [Encouraged]  Mental Health Interventions   Mental Health Discussed/Reviewed Mental Health Discussed, Anxiety, Depression, Grief and Loss, Substance Abuse, Suicide, Other, Crisis, Coping Strategies, Mental Health Reviewed  [Domestic Violence]  Nutrition Interventions   Nutrition Discussed/Reviewed Nutrition Discussed, Adding fruits and vegetables, Increaing proteins, Decreasing fats, Decreasing salt, Nutrition Reviewed, Carbohydrate meal planning, Decreasing sugar intake, Supplmental nutrition, Portion sizes, Fluid intake  [Encouraged]  Pharmacy Interventions   Pharmacy Dicussed/Reviewed Pharmacy Topics Discussed, Pharmacy Topics Reviewed, Medication Adherence, Affording Medications  [Encouraged]  Safety Interventions   Safety Discussed/Reviewed Safety Discussed, Safety Reviewed, Fall Risk, Home Safety  [Encouraged]  Home Safety Assistive Devices, Need for home safety assessment, Refer for community resources  [Encouraged]  Advanced Directive Interventions   Advanced Directives Discussed/Reviewed Advanced Directives Discussed  [Encouraged]     Active Listening & Reflection Utilized.  Verbalization of Feelings Encouraged.  Emotional Support Provided. Feelings of Caregiver Burnout Validated. Caregiver Stress Acknowledged. Caregiver Resources Reviewed. Problem Solving Interventions Activated. Task-Centered Solutions Implemented.   Solution-Focused Strategies Employed. Acceptance & Commitment Therapy  Performed. Cognitive Behavioral Therapy Initiated. Client-Centered Therapy Indicated. CSW Collaboration with Daughter, [name] to Confirm Admission to [facility], on 01/19/2023.  CSW Collaboration with Daughter, [name] to Confirm Interest in Pursuing Higher Level of Care Placement, Preferably in Skilled Nursing Facility to [facility], Upon Medical Clearance & Discharge from [facility] CSW Collaboration with [name], Transitions of Care Social  Worker with [facility], Via Secure Chat Message in [place], to Confirm Daughter, [name]'s Interest in Placement in Skilled Nursing Facility to [facility], Upon Medical Clearance & Discharge from [facility]. CSW Collaboration with Daughter, [name] to [employer] with CSW [phone]), if She Has Questions, Needs Assistance, or If Additional Social Work Needs Are Identified Between Now & Our Next Scheduled Follow-Up Outreach Call.      SDOH assessments and interventions completed:  Yes.  Care Coordination Interventions:  Yes, provided.   Follow up plan: Follow up call scheduled for 02/03/2023 at 1:45 pm.  Encounter Outcome:  Pt. Visit Completed.   Danford Bad, BSW, MSW, LCSW  Licensed Restaurant manager, [employer] [facility]  Mailing [place] [address], [place], [place] [address] Physical Address-[address] [place], [place] [address] Toll Free Main # [phone] Fax # [phone] Cell # [phone] [email or web address]

## 2023-01-20 NOTE — Inpatient Diabetes Management (Signed)
Inpatient Diabetes Program Recommendations  AACE/ADA: New Consensus Statement on Inpatient Glycemic Control (2015)  Target Ranges:  Prepandial:   less than 140 mg/dL      Peak postprandial:   less than 180 mg/dL (1-2 hours)      Critically ill patients:  140 - 180 mg/dL   Lab Results  Component Value Date   GLUCAP 92 01/20/2023   HGBA1C 6.2 (H) 11/06/2022    Review of Glycemic Control  Latest Reference Range & Units 01/19/23 20:29 01/20/23 01:58  Glucose-Capillary 70 - 99 mg/dL 64 (L) 92  (L): Data is abnormally low Diabetes history: PreDM Outpatient Diabetes medications: none Current orders for Inpatient glycemic control: none  Inpatient Diabetes Program Recommendations:    Noted hypoglycemia on presentation and associated interventions. Consider adding CBGs TID.   Thanks, Lujean Rave, MSN, RNC-OB Diabetes Coordinator [phone] (8a-5p)

## 2023-01-21 ENCOUNTER — Other Ambulatory Visit (HOSPITAL_COMMUNITY): Payer: Self-pay

## 2023-01-21 ENCOUNTER — Telehealth: Payer: Self-pay | Admitting: Internal Medicine

## 2023-01-21 ENCOUNTER — Encounter (HOSPITAL_COMMUNITY): Payer: Self-pay | Admitting: Oncology

## 2023-01-21 DIAGNOSIS — M6282 Rhabdomyolysis: Secondary | ICD-10-CM | POA: Diagnosis not present

## 2023-01-21 DIAGNOSIS — R7303 Prediabetes: Secondary | ICD-10-CM | POA: Diagnosis not present

## 2023-01-21 DIAGNOSIS — E43 Unspecified severe protein-calorie malnutrition: Secondary | ICD-10-CM | POA: Diagnosis not present

## 2023-01-21 DIAGNOSIS — R41841 Cognitive communication deficit: Secondary | ICD-10-CM | POA: Diagnosis not present

## 2023-01-21 DIAGNOSIS — D519 Vitamin B12 deficiency anemia, unspecified: Secondary | ICD-10-CM | POA: Diagnosis not present

## 2023-01-21 DIAGNOSIS — D508 Other iron deficiency anemias: Secondary | ICD-10-CM | POA: Diagnosis not present

## 2023-01-21 DIAGNOSIS — E860 Dehydration: Secondary | ICD-10-CM | POA: Diagnosis not present

## 2023-01-21 DIAGNOSIS — N138 Other obstructive and reflux uropathy: Secondary | ICD-10-CM | POA: Diagnosis not present

## 2023-01-21 DIAGNOSIS — M5136 Other intervertebral disc degeneration, lumbar region: Secondary | ICD-10-CM | POA: Diagnosis not present

## 2023-01-21 DIAGNOSIS — R7989 Other specified abnormal findings of blood chemistry: Secondary | ICD-10-CM | POA: Diagnosis not present

## 2023-01-21 DIAGNOSIS — G3184 Mild cognitive impairment, so stated: Secondary | ICD-10-CM | POA: Diagnosis not present

## 2023-01-21 DIAGNOSIS — G7080 Lambert-Eaton syndrome, unspecified: Secondary | ICD-10-CM | POA: Diagnosis not present

## 2023-01-21 DIAGNOSIS — M6281 Muscle weakness (generalized): Secondary | ICD-10-CM | POA: Diagnosis not present

## 2023-01-21 DIAGNOSIS — I2489 Other forms of acute ischemic heart disease: Secondary | ICD-10-CM | POA: Diagnosis not present

## 2023-01-21 DIAGNOSIS — D509 Iron deficiency anemia, unspecified: Secondary | ICD-10-CM | POA: Diagnosis not present

## 2023-01-21 DIAGNOSIS — E785 Hyperlipidemia, unspecified: Secondary | ICD-10-CM | POA: Diagnosis not present

## 2023-01-21 DIAGNOSIS — I959 Hypotension, unspecified: Secondary | ICD-10-CM | POA: Diagnosis not present

## 2023-01-21 DIAGNOSIS — Z9181 History of falling: Secondary | ICD-10-CM | POA: Diagnosis not present

## 2023-01-21 DIAGNOSIS — I4891 Unspecified atrial fibrillation: Secondary | ICD-10-CM | POA: Diagnosis not present

## 2023-01-21 DIAGNOSIS — T796XXS Traumatic ischemia of muscle, sequela: Secondary | ICD-10-CM | POA: Diagnosis not present

## 2023-01-21 DIAGNOSIS — R2681 Unsteadiness on feet: Secondary | ICD-10-CM | POA: Diagnosis not present

## 2023-01-21 DIAGNOSIS — D649 Anemia, unspecified: Secondary | ICD-10-CM | POA: Diagnosis not present

## 2023-01-21 DIAGNOSIS — I10 Essential (primary) hypertension: Secondary | ICD-10-CM | POA: Diagnosis not present

## 2023-01-21 DIAGNOSIS — I480 Paroxysmal atrial fibrillation: Secondary | ICD-10-CM | POA: Diagnosis not present

## 2023-01-21 DIAGNOSIS — G9341 Metabolic encephalopathy: Secondary | ICD-10-CM | POA: Diagnosis not present

## 2023-01-21 DIAGNOSIS — R600 Localized edema: Secondary | ICD-10-CM | POA: Diagnosis not present

## 2023-01-21 DIAGNOSIS — I739 Peripheral vascular disease, unspecified: Secondary | ICD-10-CM | POA: Diagnosis not present

## 2023-01-21 DIAGNOSIS — N401 Enlarged prostate with lower urinary tract symptoms: Secondary | ICD-10-CM | POA: Diagnosis not present

## 2023-01-21 DIAGNOSIS — Z7982 Long term (current) use of aspirin: Secondary | ICD-10-CM | POA: Diagnosis not present

## 2023-01-21 DIAGNOSIS — Z1152 Encounter for screening for COVID-19: Secondary | ICD-10-CM | POA: Diagnosis not present

## 2023-01-21 DIAGNOSIS — R569 Unspecified convulsions: Secondary | ICD-10-CM | POA: Diagnosis not present

## 2023-01-21 DIAGNOSIS — I272 Pulmonary hypertension, unspecified: Secondary | ICD-10-CM | POA: Diagnosis not present

## 2023-01-21 DIAGNOSIS — I951 Orthostatic hypotension: Secondary | ICD-10-CM | POA: Diagnosis not present

## 2023-01-21 LAB — CBC
HCT: 27.7 % — ABNORMAL LOW (ref 39.0–52.0)
Hemoglobin: 9.1 g/dL — ABNORMAL LOW (ref 13.0–17.0)
MCH: 23 pg — ABNORMAL LOW (ref 26.0–34.0)
MCHC: 32.9 g/dL (ref 30.0–36.0)
MCV: 69.9 fL — ABNORMAL LOW (ref 80.0–100.0)
Platelets: 251 10*3/uL (ref 150–400)
RBC: 3.96 MIL/uL — ABNORMAL LOW (ref 4.22–5.81)
RDW: 14.3 % (ref 11.5–15.5)
WBC: 8 10*3/uL (ref 4.0–10.5)
nRBC: 0 % (ref 0.0–0.2)

## 2023-01-21 LAB — SARS CORONAVIRUS 2 BY RT PCR: SARS Coronavirus 2 by RT PCR: NEGATIVE

## 2023-01-21 LAB — HEPARIN LEVEL (UNFRACTIONATED): Heparin Unfractionated: 0.32 IU/mL (ref 0.30–0.70)

## 2023-01-21 MED ORDER — DILTIAZEM HCL ER COATED BEADS 240 MG PO CP24: 240.0000 mg | ORAL_CAPSULE | Freq: Every day | ORAL | 3 refills | Status: DC

## 2023-01-21 MED ORDER — TAMSULOSIN HCL 0.4 MG PO CAPS: 0.4000 mg | ORAL_CAPSULE | Freq: Every day | ORAL | 3 refills | Status: DC

## 2023-01-21 MED ORDER — APIXABAN 5 MG PO TABS: 5.0000 mg | ORAL_TABLET | Freq: Two times a day (BID) | ORAL | 5 refills | Status: DC

## 2023-01-21 MED ORDER — APIXABAN 5 MG PO TABS
5.0000 mg | ORAL_TABLET | Freq: Two times a day (BID) | ORAL | Status: DC
  Administered 2023-01-21: 5 mg via ORAL
  Filled 2023-01-21: qty 1

## 2023-01-21 MED ORDER — DILTIAZEM HCL ER COATED BEADS 120 MG PO CP24
240.0000 mg | ORAL_CAPSULE | Freq: Every day | ORAL | Status: DC
  Administered 2023-01-21: 240 mg via ORAL
  Filled 2023-01-21: qty 1

## 2023-01-21 MED ORDER — MIDODRINE HCL 10 MG PO TABS: 10.0000 mg | ORAL_TABLET | Freq: Three times a day (TID) | ORAL | 3 refills | Status: DC

## 2023-01-21 NOTE — NC FL2 (Signed)
[place] MEDICAID FL2 LEVEL OF CARE FORM     IDENTIFICATION  Patient Name: Erik [name] Birthdate: 16-Jul-1946 Sex: male Admission Date (Current Location): 01/19/2023  [facility] and [place] Number:  [employer] and Address:  [facility],  [address] [place] [address]      Provider Number: [id number]  Attending Physician Name and Address:  Shon Hale, MD  Relative Name and Phone Number:       Current Level of Care: Hospital Recommended Level of Care: Skilled Nursing Facility Prior Approval Number:    Date Approved/Denied:   PASRR Number: [id number]  Discharge Plan: SNF    Current Diagnoses: Patient Active Problem List   Diagnosis Date Noted   Elevated troponin 01/20/2023   Rhabdomyolysis 01/20/2023   Acute metabolic encephalopathy 01/20/2023   Atrial fibrillation with RVR (HCC) 01/19/2023   Vitamin D deficiency 10/28/2022   Cognitive impairment 10/28/2022   PAD (peripheral artery disease) (HCC) 10/28/2022   Encounter for general adult medical examination with abnormal findings 10/28/2022   Prediabetes 10/28/2022   Iron deficiency anemia 06/04/2016   Microcytic anemia 06/25/2015   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   B12 deficiency 06/25/2015   Loss of weight 10/26/2014   Dysphagia, pharyngoesophageal phase 10/26/2014   Low back pain 07/10/2014   Orthostatic hypotension 07/07/2014   Orthostasis 07/07/2014    Orientation RESPIRATION BLADDER Height & Weight     Self, Place  Normal Incontinent Weight: 209 lb 7 oz (95 kg) Height:  6\' 3"  (190.5 cm)  BEHAVIORAL SYMPTOMS/MOOD NEUROLOGICAL BOWEL NUTRITION STATUS      Continent Diet (see dc summary)  AMBULATORY STATUS COMMUNICATION OF NEEDS Skin   Extensive Assist Verbally Normal                       Personal Care Assistance Level of Assistance  Bathing, Feeding, Dressing Bathing Assistance: Limited assistance Feeding assistance:  Independent Dressing Assistance: Limited assistance     Functional Limitations Info  Sight, Hearing, Speech Sight Info: Adequate Hearing Info: Adequate Speech Info: Adequate    SPECIAL CARE FACTORS FREQUENCY  PT (By licensed PT), OT (By licensed OT)     PT Frequency: 5x week OT Frequency: 5x week            Contractures Contractures Info: Not present    Additional Factors Info  Code Status, Allergies Code Status Info: Full Allergies Info: NKA           Current Medications (01/21/2023):  This is the current hospital active medication list Current Facility-Administered Medications  Medication Dose Route Frequency Provider Last Rate Last Admin   acetaminophen (TYLENOL) tablet 650 mg  650 mg Oral Q6H PRN Zierle-Ghosh, Asia B, DO       Or   acetaminophen (TYLENOL) suppository 650 mg  650 mg Rectal Q6H PRN Zierle-Ghosh, Asia B, DO       Amifampridine Phosphate TABS 15 mg  15 mg Oral TID Zierle-Ghosh, Asia B, DO   15 mg at 01/20/23 2142   apixaban (ELIQUIS) tablet 5 mg  5 mg Oral BID Emokpae, Courage, MD   5 mg at 01/21/23 0926   aspirin EC tablet 81 mg  81 mg Oral Daily Zierle-Ghosh, Asia B, DO   81 mg at 01/21/23 0829   Chlorhexidine Gluconate Cloth 2 % PADS 6 each  6 each Topical Q0600 Zierle-Ghosh, Asia B, DO   6 each at 01/21/23 0550   diltiazem (CARDIZEM CD) 24 hr  capsule 240 mg  240 mg Oral Daily Emokpae, Courage, MD   240 mg at 01/21/23 0925   midodrine (PROAMATINE) tablet 5 mg  5 mg Oral TID WC Emokpae, Courage, MD   5 mg at 01/21/23 0829   morphine (PF) 2 MG/ML injection 2 mg  2 mg Intravenous Q2H PRN Zierle-Ghosh, Asia B, DO       ondansetron (ZOFRAN) tablet 4 mg  4 mg Oral Q6H PRN Zierle-Ghosh, Asia B, DO       Or   ondansetron (ZOFRAN) injection 4 mg  4 mg Intravenous Q6H PRN Zierle-Ghosh, Asia B, DO       oxyCODONE (Oxy IR/ROXICODONE) immediate release tablet 5 mg  5 mg Oral Q4H PRN Zierle-Ghosh, Asia B, DO   5 mg at 01/21/23 0829   tamsulosin (FLOMAX) capsule  0.4 mg  0.4 mg Oral QPC supper Shon Hale, MD         Discharge Medications: Please see discharge summary for a list of discharge medications.  Relevant Imaging Results:  Relevant Lab Results:   Additional Information SSN: [id number] LCSW

## 2023-01-21 NOTE — Discharge Instructions (Addendum)
-  1)Avoid ibuprofen/Advil/Aleve/Motrin/Goody Powders/Naproxen/BC powders/Meloxicam/Diclofenac/Indomethacin and other Nonsteroidal anti-inflammatory medications as these will make you more likely to bleed and can cause stomach ulcers, can also cause Kidney problems.   2)Repeat CBC and BMP Test on Tuesday 01/26/23  Information on my medicine - ELIQUIS (apixaban)  This medication education was reviewed with me or my healthcare representative as part of my discharge preparation.    Why was Eliquis prescribed for you? Eliquis was prescribed for you to reduce the risk of a blood clot forming that can cause a stroke if you have a medical condition called atrial fibrillation (a type of irregular heartbeat).  What do You need to know about Eliquis ? Take your Eliquis TWICE DAILY - one tablet in the morning and one tablet in the evening with or without food. If you have difficulty swallowing the tablet whole please discuss with your pharmacist how to take the medication safely.  Take Eliquis exactly as prescribed by your doctor and DO NOT stop taking Eliquis without talking to the doctor who prescribed the medication.  Stopping may increase your risk of developing a stroke.  Refill your prescription before you run out.  After discharge, you should have regular check-up appointments with your healthcare provider that is prescribing your Eliquis.  In the future your dose may need to be changed if your kidney function or weight changes by a significant amount or as you get older.  What do you do if you miss a dose? If you miss a dose, take it as soon as you remember on the same day and resume taking twice daily.  Do not take more than one dose of ELIQUIS at the same time to make up a missed dose.  Important Safety Information A possible side effect of Eliquis is bleeding. You should call your healthcare provider right away if you experience any of the following: Bleeding from an injury or your nose  that does not stop. Unusual colored urine (red or dark brown) or unusual colored stools (red or black). Unusual bruising for unknown reasons. A serious fall or if you hit your head (even if there is no bleeding).  Some medicines may interact with Eliquis and might increase your risk of bleeding or clotting while on Eliquis. To help avoid this, consult your healthcare provider or pharmacist prior to using any new prescription or non-prescription medications, including herbals, vitamins, non-steroidal anti-inflammatory drugs (NSAIDs) and supplements.  This website has more information on Eliquis (apixaban): http://www.eliquis.com/eliquis/home

## 2023-01-21 NOTE — Care Management Important Message (Signed)
Important Message  Patient Details  Name: Erik [name] MRN: [id number] Date of Birth: [birth year]   Medicare Important Message Given:  N/A - LOS <3 / Initial given by admissions     Erik [name] 01/21/2023, 11:01 AM

## 2023-01-21 NOTE — Progress Notes (Addendum)
ANTICOAGULATION CONSULT NOTE -  Pharmacy Consult for heparin Indication: atrial fibrillation  No Known Allergies  Patient Measurements: Height: 6\' 3"  (190.5 cm) Weight: 95 kg (209 lb 7 oz) IBW/kg (Calculated) : 84.5 Heparin Dosing Weight: 92 Kg  Vital Signs: Temp: 98.9 F (37.2 C) (06/13 0745) Temp Source: Oral (06/13 0745) BP: 123/51 (06/13 0700) Pulse Rate: 74 (06/13 0700)  Labs: Recent Labs    01/19/23 2045 01/19/23 2229 01/20/23 0500 01/20/23 0846 01/20/23 1807 01/21/23 0541  HGB 10.4*  --  10.0* 10.3*  --  9.1*  HCT 32.5*  --  30.6* 31.7*  --  27.7*  PLT 237  --  258 277  --  251  HEPARINUNFRC  --   --   --  0.21* 0.24* 0.32  CREATININE 1.20  --  0.98  --   --   --   CKTOTAL  --  1,377* 1,030*  --   --   --   TROPONINIHS 86* 81*  --   --   --   --      Estimated Creatinine Clearance: 75.4 mL/min (by C-G formula based on SCr of 0.98 mg/dL).   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 No oral anticoagulation reported prior to admission. Hgb low 10.4 and PLT WNL. Pharmacy consulted to start heparin infusion.  Hgb 10.4> 9.1, no bleeding noted. Possibly dilutional  Heparin level 0.32 therapeutic. No issues with infusion   Goal of Therapy:  Heparin level 0.3-0.7 units/ml Monitor platelets by anticoagulation protocol: Yes   Plan:  Continue heparin infusion at 1600 units/hr Check anti-Xa level in 8 hours for confirmation and daily while on heparin Continue to monitor H&H and platelets F/U transition to PO tx  Elder Cyphers, BS Pharm D, BCPS Clinical Pharmacist 01/21/2023, 8:03 AM

## 2023-01-21 NOTE — TOC Benefit Eligibility Note (Signed)
Pharmacy Patient Advocate Encounter  Insurance verification completed.    The patient is insured through [facility]    Ran test claim for Eliquis and the current 30 day co-pay is $0.00.   This test claim was processed through [facility]- copay amounts may vary at other pharmacies due to pharmacy/plan contracts, or as the patient moves through the different stages of their insurance plan.

## 2023-01-21 NOTE — Telephone Encounter (Signed)
Tammy Ward with [facility] Nursing called in requesting a call back in regard to patient.  [phone]

## 2023-01-21 NOTE — TOC Progression Note (Signed)
Transition of Care [facility]) - Progression Note    Patient Details  Name: Erik [name] MRN: [id number] Date of Birth: 1946/04/09  Transition of [facility]) CM/SW Contact  [name], LCSW Phone Number: 01/21/2023, 12:07 PM  Clinical Narrative:     TOC following. Pt now agreeable to SNF rehab. CMS provider options reviewed. Will refer as requested. Will follow.  Expected Discharge Plan: Skilled Nursing Facility Barriers to Discharge: Continued Medical Work up  Expected Discharge Plan and Services In-house Referral: Clinical Social Work   Post Acute Care Choice: Skilled Nursing Facility Living arrangements for the past 2 months: Single Family Home Expected Discharge Date: 01/21/23                                     Social Determinants of Health (SDOH) Interventions SDOH Screenings   Food Insecurity: No Food Insecurity (01/20/2023)  Housing: Patient Declined (01/20/2023)  Transportation Needs: No Transportation Needs (01/20/2023)  Utilities: Not At Risk (01/20/2023)  Alcohol Screen: Low Risk  (12/10/2022)  Depression (PHQ2-9): Low Risk  (12/10/2022)  Financial Resource Strain: Low Risk  (12/11/2022)  Physical Activity: Inactive (12/11/2022)  Social Connections: Socially Isolated (12/11/2022)  Stress: Patient Unable To Answer (12/11/2022)  Tobacco Use: Medium Risk (01/20/2023)    Readmission Risk Interventions     No data to display

## 2023-01-21 NOTE — Telephone Encounter (Signed)
Tammy Ward called back from [facility] nursing need immunizations on this patient.  There is no records showing in the system please return call [phone]

## 2023-01-21 NOTE — Discharge Summary (Signed)
Erik [name], is a 77 y.o. male  DOB 1945-11-19  MRN [id number].  Admission date:  01/19/2023  Admitting Physician  Lilyan Gilford, DO  Discharge Date:  01/21/2023   Primary MD  [name], MD  Recommendations for primary care physician for things to follow:  1)Avoid ibuprofen/Advil/Aleve/Motrin/Goody Powders/Naproxen/BC powders/Meloxicam/Diclofenac/Indomethacin and other Nonsteroidal anti-inflammatory medications as these will make you more likely to bleed and can cause stomach ulcers, can also cause Kidney problems.   2)Repeat CBC and BMP Test on Tuesday 01/26/23  Admission Diagnosis  Disorientation [R41.0] Atrial fibrillation with RVR (HCC) [I48.91]   Discharge Diagnosis  Disorientation [R41.0] Atrial fibrillation with RVR (HCC) [I48.91]    Principal Problem:   Atrial fibrillation with RVR (HCC) Active Problems:   Acute metabolic encephalopathy   PAD (peripheral artery disease) (HCC)   Rhabdomyolysis   LEMS (Lambert-Eaton myasthenic syndrome) (HCC)   Iron deficiency anemia   Elevated troponin      Past Medical History:  Diagnosis Date   B12 deficiency 06/25/2015   DDD (degenerative disc disease), lumbar    Iron deficiency anemia 06/04/2016   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   Low blood pressure    Seizures (HCC)    in remote past     Past Surgical History:  Procedure Laterality Date   COLONOSCOPY N/A 11/12/2014   Dr. Jennell Corner external and internal hemorrhoids, redundant left colon   ESOPHAGEAL DILATION N/A 11/12/2014   Procedure: ESOPHAGEAL DILATION;  Surgeon: West Bali, MD;  Location: [facility];  Service: Endoscopy;  Laterality: N/A;   ESOPHAGOGASTRODUODENOSCOPY N/A 11/12/2014   Dr. Fields:mild non-erosive gastritis. negative H.pylori    None       HPI  from the history and physical done on the day of admission:   HPI: Erik [name] is a 77 y.o. male with medical history significant of seizure disorder, anemia, Lambert-Eaton syndrome, and more presents the ED with a chief complaint of found down.  Patient reports he does not remember it.  It is reported that he was found on the floor but he reports that he may have down to sleep and then woke up in the hospital.  He reports he does not remember falling.  He did not have any chest pain, palpitations, dyspnea.  He has been a little more fatigued than normal per his report.  Patient has garbled speech that is difficult to understand.  He reports that this is his normal speech.  Patient has no other complaints at this time.   Patient does not smoke, does not drink.  He is vaccinated for COVID.  Patient is full code. Review of Systems: As mentioned in the history of present illness. All other systems reviewed and are negative    Hospital Course:     Assessment and Plan: 77 y.o. male with medical history significant of seizure disorder, chronic iron deficiency anemia, Lambert-Eaton syndrome and PAD admitted on 01/20/2023 with acute metabolic encephalopathy and A-fib with RVR after being found on the  floor during a welfare call     -Assessment and Plan: 1)Presumed New onset atrial fibrillation with RVR ----POA --TSH--WNL -Echo with EF of 50 to 55%, no regional wall motion maladies diastolic parameters are normal, there was moderate pulmonary hypertension, no mitral stenosis no aortic stenosis -trop mildly elevated - likely demand ischemia  -no signs of infection  CHA2DS2- VASc score   is =4  (age x 2, PAD, HTN)    Which is  equal to =  % annual risk of stroke  -This patients CHA2DS2-VASc Score and unadjusted Ischemic Stroke Rate (% per year) is equal to 4.8 % stroke rate/year from a score of 4 -Treated with IV heparin and IV Cardizem  -Okay to discharge on p.o. Eliquis and p.o. Cardizem  2)Acute metabolic encephalopathy --Patient's daughter [name] is at bedside  states that mentation is back to baseline at this time  3) concerns about possible underlying dementia----MMSE 24/30 -Outpatient follow-up with PCP and neurologist advised  3)Rhabdomyolysis---patient was found down in the house during a welfare call unclear how long he has been on the floor -Improved with IV fluids   4)Elevated troponin -Demand ischemia in the setting of afib with RVR -Down trending -EKG initially showed afib with RVR -Repeat EKG on 01/21/2023 - sinus rhythm -As above with preserved EF and no regional wall motion abnormalities or significant valvular abnormalities     5)PAD (peripheral artery disease) (HCC) -Continue atorvastatin  -Hold aspirin as patient needs Eliquis for A-fib   6)LEMS (Lambert-Eaton myasthenic syndrome) (HCC) -Continue amifampiridine   7) status post falL/generalized weakness and ambulatory dysfunction-- --physical therapist recommends SNF rehab patient and daughter agreeable  8) chronic anemia--- Hgb may drop with hydration/hemodilution -No bleeding concerns at this time -Repeat CBC on Tuesday, 01/22/2023    Disposition: The patient is from: Home              Anticipated d/c is to: SNF rehab Code Status :  -  Code Status: Full Code    Family Communication:   -Discussed with daughter MS [name] at bedside  Discharge Condition: Stable  Follow UP   Contact information for follow-up providers     [name], MD Follow up on 01/26/2023.   Specialty: Internal Medicine Why: Repeat CBC and BMP Contact information: [address] [place] [address] [phone]              Contact information for after-discharge care     Destination     [facility] SN[facility] .   Service: Skilled Nursing Contact information: [address] [place] [address] [phone]                    Diet and Activity recommendation:  As advised  Discharge Instructions    Discharge  Instructions     Call MD for:  difficulty breathing, headache or visual disturbances   Complete by: As directed    Call MD for:  persistant dizziness or light-headedness   Complete by: As directed    Call MD for:  persistant nausea and vomiting   Complete by: As directed    Call MD for:  temperature >100.4   Complete by: As directed    Diet - low sodium heart healthy   Complete by: As directed    Discharge instructions   Complete by: As directed    1)Avoid ibuprofen/Advil/Aleve/Motrin/Goody Powders/Naproxen/BC powders/Meloxicam/Diclofenac/Indomethacin and other Nonsteroidal anti-inflammatory medications as these will make you more  likely to bleed and can cause stomach ulcers, can also cause Kidney problems.   2)Repeat CBC and BMP Test on Tuesday 01/26/23   Increase activity slowly   Complete by: As directed        Discharge Medications     Allergies as of 01/21/2023   No Known Allergies      Medication List     STOP taking these medications    aspirin EC 81 MG tablet       TAKE these medications    acetaminophen 500 MG tablet Commonly known as: TYLENOL Take 500 mg by mouth every 6 (six) hours as needed.   Amifampridine Phosphate 10 MG Tabs Take 15 mg by mouth 4 (four) times daily.   apixaban 5 MG Tabs tablet Commonly known as: ELIQUIS Take 1 tablet (5 mg total) by mouth 2 (two) times daily.   atorvastatin 40 MG tablet Commonly known as: LIPITOR Take 1 tablet (40 mg total) by mouth daily.   D3 5000 125 MCG (5000 UT) capsule Generic drug: Cholecalciferol Take 5,000 Units by mouth daily.   diltiazem 240 MG 24 hr capsule Commonly known as: CARDIZEM CD Take 1 capsule (240 mg total) by mouth daily. Start taking on: January 22, 2023   furosemide 40 MG tablet Commonly known as: LASIX TAKE 1 TABLET BY MOUTH ON MONDAY, WEDNESDAY AND FRIDAY - MAY TAKE ADDITIONAL AS NEEDED FOR SWELLING. PT NEEDS APPT FOR FURTHER REFILLS What changed: See the new instructions.    midodrine 10 MG tablet Commonly known as: PROAMATINE Take 1 tablet (10 mg total) by mouth 3 (three) times daily with meals. What changed:  medication strength how much to take   tamsulosin 0.4 MG Caps capsule Commonly known as: FLOMAX Take 1 capsule (0.4 mg total) by mouth daily after supper.   trolamine salicylate 10 % cream Commonly known as: ASPERCREME Apply 1 application topically as needed for muscle pain.       Major procedures and Radiology Reports - PLEASE review detailed and final reports for all details, in brief -   ECHOCARDIOGRAM COMPLETE  Result Date: 01/20/2023    ECHOCARDIOGRAM REPORT   Patient Name:   RIKUTO VONBERGEN Date of Exam: 01/20/2023 Medical Rec #:  [id number]           Height:       75.0 in Accession #:    [id number]          Weight:       202.8 lb Date of Birth:  1946-01-14           BSA:          2.207 m Patient Age:    76 years            BP:           123/53 mmHg Patient Gender: M                   HR:           77 bpm. [facility] Procedure: 2D Echo, Cardiac Doppler and Color Doppler Indications:    Atrial Fibrillation I48.91  History:        Patient has no prior history of Echocardiogram examinations.                 PAD, Arrythmias:Atrial Fibrillation; Risk Factors:Former Smoker.  Sonographer:    Aron Baba Referring Phys: [id number] ASIA B ZIERLE-GHOSH IMPRESSIONS  1. Left ventricular ejection fraction, by  estimation, is 50 to 55%. The left ventricle has low normal function. The left ventricle has no regional wall motion abnormalities. Left ventricular diastolic parameters were normal.  2. Right ventricular systolic function is normal. The right ventricular size is normal. There is moderately elevated pulmonary artery systolic pressure.  3. The mitral valve is normal in structure. No evidence of mitral valve regurgitation. No evidence of mitral stenosis.  4. The tricuspid valve is abnormal.  5. The aortic valve is tricuspid. Aortic valve  regurgitation is not visualized. No aortic stenosis is present.  6. The inferior vena cava is dilated in size with <50% respiratory variability, suggesting right atrial pressure of 15 mmHg. FINDINGS  Left Ventricle: Left ventricular ejection fraction, by estimation, is 50 to 55%. The left ventricle has low normal function. The left ventricle has no regional wall motion abnormalities. The left ventricular internal cavity size was normal in size. There is no left ventricular hypertrophy. Left ventricular diastolic parameters were normal. Right Ventricle: The right ventricular size is normal. Right vetricular wall thickness was not well visualized. Right ventricular systolic function is normal. There is moderately elevated pulmonary artery systolic pressure. The tricuspid regurgitant velocity is 3.34 m/s, and with an assumed right atrial pressure of 15 mmHg, the estimated right ventricular systolic pressure is 59.6 mmHg. Left Atrium: Left atrial size was normal in size. Right Atrium: Right atrial size was normal in size. Pericardium: There is no evidence of pericardial effusion. Mitral Valve: The mitral valve is normal in structure. No evidence of mitral valve regurgitation. No evidence of mitral valve stenosis. Tricuspid Valve: The tricuspid valve is abnormal. Tricuspid valve regurgitation is mild . No evidence of tricuspid stenosis. Aortic Valve: The aortic valve is tricuspid. Aortic valve regurgitation is not visualized. No aortic stenosis is present. Aortic valve mean gradient measures 4.7 mmHg. Aortic valve peak gradient measures 8.7 mmHg. Aortic valve area, by VTI measures 1.54 cm. Pulmonic Valve: The pulmonic valve was not well visualized. Pulmonic valve regurgitation is mild. No evidence of pulmonic stenosis. Aorta: The aortic root is normal in size and structure. Venous: The inferior vena cava is dilated in size with less than 50% respiratory variability, suggesting right atrial pressure of 15 mmHg.  IAS/Shunts: The interatrial septum was not well visualized.  LEFT VENTRICLE PLAX 2D LVIDd:         4.40 cm   Diastology LVIDs:         3.20 cm   LV e' medial:    8.55 cm/s LV PW:         0.80 cm   LV E/e' medial:  10.5 LV IVS:        0.60 cm   LV e' lateral:   13.10 cm/s LVOT diam:     1.80 cm   LV E/e' lateral: 6.8 LV SV:         47 LV SV Index:   21 LVOT Area:     2.54 cm  RIGHT VENTRICLE RV S prime:     9.86 cm/s TAPSE (M-mode): 2.4 cm LEFT ATRIUM           Index        RIGHT ATRIUM           Index LA diam:      2.80 cm 1.27 cm/m   RA Area:     20.50 cm LA Vol (A2C): 39.1 ml 17.71 ml/m  RA Volume:   60.30 ml  27.32 ml/m LA Vol (A4C): 66.0 ml 29.90  ml/m  AORTIC VALVE                     PULMONIC VALVE AV Area (Vmax):    1.64 cm      PR End Diast Vel: 4.24 msec AV Area (Vmean):   1.44 cm AV Area (VTI):     1.54 cm AV Vmax:           147.67 cm/s AV Vmean:          102.416 cm/s AV VTI:            0.306 m AV Peak Grad:      8.7 mmHg AV Mean Grad:      4.7 mmHg LVOT Vmax:         95.17 cm/s LVOT Vmean:        58.036 cm/s LVOT VTI:          0.185 m LVOT/AV VTI ratio: 0.61  AORTA Ao Root diam: 3.10 cm Ao Asc diam:  2.90 cm MITRAL VALVE               TRICUSPID VALVE MV Area (PHT): 3.99 cm    TR Peak grad:   44.6 mmHg MV Decel Time: 190 msec    TR Vmax:        334.00 cm/s MR Peak grad: 5.6 mmHg MR Vmax:      118.00 cm/s  SHUNTS MV E velocity: 89.70 cm/s  Systemic VTI:  0.19 m MV A velocity: 67.30 cm/s  Systemic Diam: 1.80 cm MV E/A ratio:  1.33 Dina Rich MD Electronically signed by Dina Rich MD Signature Date/Time: 01/20/2023/12:03:55 PM    Final    CT Cervical Spine Wo Contrast  Result Date: 01/19/2023 CLINICAL DATA:  Ataxia, cervical trauma EXAM: CT CERVICAL SPINE WITHOUT CONTRAST TECHNIQUE: Multidetector CT imaging of the cervical spine was performed without intravenous contrast. Multiplanar CT image reconstructions were also generated. RADIATION DOSE REDUCTION: This exam was performed according  to the departmental dose-optimization program which includes automated exposure control, adjustment of the mA and/or kV according to patient size and/or use of iterative reconstruction technique. COMPARISON:  None Available. FINDINGS: Alignment: No traumatic subluxation. Trace anterolisthesis of C2 on C3 and retrolisthesis of C5 on C6 likely degenerative. Skull base and vertebrae: No acute fracture. Vertebral body heights are maintained. The dens and skull base are intact. Soft tissues and spinal canal: No prevertebral fluid or swelling. No visible canal hematoma. Disc levels: Moderate degenerative disc disease and facet hypertrophy. No high-grade canal stenosis. Upper chest: Emphysema. No acute finding. Other: None. IMPRESSION: Moderate degenerative disc disease and facet hypertrophy without acute fracture or subluxation. Electronically Signed   By: Narda Rutherford M.D.   On: 01/19/2023 23:06   CT Head Wo Contrast  Result Date: 01/19/2023 CLINICAL DATA:  Head trauma, intracranial arterial injury suspected Altered mental status. EXAM: CT HEAD WITHOUT CONTRAST TECHNIQUE: Contiguous axial images were obtained from the base of the skull through the vertex without intravenous contrast. RADIATION DOSE REDUCTION: This exam was performed according to the departmental dose-optimization program which includes automated exposure control, adjustment of the mA and/or kV according to patient size and/or use of iterative reconstruction technique. COMPARISON:  None Available. FINDINGS: Brain: No intracranial hemorrhage, mass effect, or midline shift. Brain volume is normal for age. No hydrocephalus. The basilar cisterns are patent. Mild periventricular chronic small vessel ischemia. Punctate lacunar infarct versus prominent perivascular space in the left basal ganglia. No evidence of territorial infarct or  acute ischemia. No extra-axial or intracranial fluid collection. Vascular: Atherosclerosis of skullbase vasculature  without hyperdense vessel or abnormal calcification. Skull: No fracture or focal lesion. Sinuses/Orbits: Paranasal sinuses and mastoid air cells are clear. The visualized orbits are unremarkable. Bilateral cataract resection. Other: None. IMPRESSION: 1. No acute intracranial abnormality. 2. Mild chronic small vessel ischemia. Electronically Signed   By: Narda Rutherford M.D.   On: 01/19/2023 23:03   DG Chest Port 1 View  Result Date: 01/19/2023 CLINICAL DATA:  Patient found on the floor. EXAM: PORTABLE CHEST 1 VIEW COMPARISON:  November 15, 2015 FINDINGS: The heart size and mediastinal contours are within normal limits. Mild, diffuse, chronic appearing increased interstitial lung markings are seen. Mild atelectasis is noted within the left lung base. There is no evidence of a pleural effusion or pneumothorax. Multilevel degenerative changes are seen throughout the thoracic spine. IMPRESSION: Mild left basilar atelectasis. Electronically Signed   By: Aram Candela M.D.   On: 01/19/2023 22:17   DG Pelvis Portable  Result Date: 01/19/2023 CLINICAL DATA:  Patient found on the floor. EXAM: PORTABLE PELVIS 1-2 VIEWS COMPARISON:  None Available. FINDINGS: There is no evidence of pelvic fracture or diastasis. No pelvic bone lesions are seen. IMPRESSION: Negative. Electronically Signed   By: Aram Candela M.D.   On: 01/19/2023 22:16    Micro Results   Recent Results (from the past 240 hour(s))  MRSA Next Gen by PCR, Nasal     Status: None   Collection Time: 01/20/23 12:18 AM   Specimen: Nasal Mucosa; Nasal Swab  Result Value Ref Range Status   MRSA by PCR Next Gen NOT DETECTED NOT DETECTED Final    Comment: (NOTE) The GeneXpert MRSA Assay (FDA approved for NASAL specimens only), is one component of a comprehensive MRSA colonization surveillance program. It is not intended to diagnose MRSA infection nor to guide or monitor treatment for MRSA infections. Test performance is not FDA approved in  patients less than 67 years old. Performed at [facility], [address], [place], [place] [address]   SARS Coronavirus 2 by RT PCR (hospital order, performed in [facility] hospital lab) *cepheid single result test* Anterior Nasal Swab     Status: None   Collection Time: 01/21/23 12:52 PM   Specimen: Anterior Nasal Swab  Result Value Ref Range Status   SARS Coronavirus 2 by RT PCR NEGATIVE NEGATIVE Final    Comment: (NOTE) SARS-CoV-2 target nucleic acids are NOT DETECTED.  The SARS-CoV-2 RNA is generally detectable in upper and lower respiratory specimens during the acute phase of infection. The lowest concentration of SARS-CoV-2 viral copies this assay can detect is 250 copies / mL. A negative result does not preclude SARS-CoV-2 infection and should not be used as the sole basis for treatment or other patient management decisions.  A negative result may occur with improper specimen collection / handling, submission of specimen other than nasopharyngeal swab, presence of viral mutation(s) within the areas targeted by this assay, and inadequate number of viral copies (<250 copies / mL). A negative result must be combined with clinical observations, patient history, and epidemiological information.  Fact Sheet for Patients:   RoadLapTop.co.za  Fact Sheet for Healthcare Providers: http://kim-miller.com/  This test is not yet approved or  cleared by the [place] FDA and has been authorized for detection and/or diagnosis of SARS-CoV-2 by FDA under an Emergency Use Authorization (EUA).  This EUA will remain in effect (meaning this test can be used) for the duration  of the COVID-19 declaration under Section 564(b)(1) of the Act, 21 U.S.C. section 360bbb-3(b)(1), unless the authorization is terminated or revoked sooner.  Performed at [facility], [address], [place], [place] [address]    Today   Subjective    Jasen Belew today has no new complaints No fever  Or chills   No Nausea, Vomiting or Diarrhea -Resting comfortably, daughter at bedside       Patient has been seen and examined prior to discharge   Objective   Blood pressure 132/76, pulse 77, temperature 98.5 F (36.9 C), temperature source Oral, resp. rate (!) 25, height 6\' 3"  (1.905 m), weight 95 kg, SpO2 93 %.   Intake/Output Summary (Last 24 hours) at 01/21/2023 1505 Last data filed at 01/21/2023 1437 Gross per 24 hour  Intake --  Output 1050 ml  Net -1050 ml    Exam Gen:- Awake Alert, no acute distress  HEENT:- [place].AT, No sclera icterus Neck-Supple Neck,No JVD,.  Lungs-  CTAB , good air movement bilaterally CV- S1, S2 normal, irregular but not irregularly irregular  abd-  +ve B.Sounds, Abd Soft, No tenderness,    Extremity/Skin:- No  edema,   good pulses Psych-affect is appropriate, oriented x3 Neuro-generalized weakness, no new focal deficits, no tremors    Data Review   CBC w Diff:  Lab Results  Component Value Date   WBC 8.0 01/21/2023   HGB 9.1 (L) 01/21/2023   HCT 27.7 (L) 01/21/2023   PLT 251 01/21/2023   LYMPHOPCT 14 01/20/2023   MONOPCT 12 01/20/2023   EOSPCT 3 01/20/2023   BASOPCT 0 01/20/2023   CMP:  Lab Results  Component Value Date   NA 141 01/20/2023   NA 138 11/06/2022   K 3.6 01/20/2023   CL 109 01/20/2023   CO2 21 (L) 01/20/2023   BUN 18 01/20/2023   BUN 12 11/06/2022   CREATININE 0.98 01/20/2023   CREATININE 1.22 (H) 08/09/2019   PROT 6.5 01/20/2023   PROT 7.2 11/06/2022   ALBUMIN 2.7 (L) 01/20/2023   ALBUMIN 4.3 11/06/2022   BILITOT 1.7 (H) 01/20/2023   BILITOT 0.7 11/06/2022   ALKPHOS 53 01/20/2023   AST 48 (H) 01/20/2023   ALT 32 01/20/2023  . Total Discharge time is about 33 minutes  Shon Hale M.D on 01/21/2023 at 3:05 PM  Go to [email or web address] -  for contact info  [facility] - Office  [phone]

## 2023-01-21 NOTE — TOC Transition Note (Signed)
Transition of Care [facility]) - CM/SW Discharge Note   Patient Details  Name: Erik [name] MRN: [id number] Date of Birth: 1946/02/17  Transition of Care [facility] CM/SW Contact:  [name], LCSW Phone Number: 01/21/2023, 3:34 PM   Clinical Narrative:     Presented bed offers to pt and he selected [facility]. Updated [facility] post acute care of pt's need for 3day SNF waiver under the ACO. Was informed that pt IS eligible for the waiver. Kerri at [facility] aware and can admit pt today.  Updated pt's daughter who is in agreement with the dc plan.  DC clinical sent electronically. RN to call report. No other TOC needs for dc.  Final next level of care: Skilled Nursing Facility Barriers to Discharge: Barriers Resolved   Patient Goals and CMS Choice CMS Medicare.gov Compare Post Acute Care list provided to:: Patient Choice offered to / list presented to : Patient  Discharge Placement                Patient chooses bed at: [facility] Patient to be transferred to facility by: w/c Name of family member notified: [name] Patient and family notified of of transfer: 01/21/23  Discharge Plan and Services Additional resources added to the After Visit Summary for   In-house Referral: Clinical Social Work   Post Acute Care Choice: Skilled Nursing Facility                               Social Determinants of Health (SDOH) Interventions SDOH Screenings   Food Insecurity: No Food Insecurity (01/20/2023)  Housing: Patient Declined (01/20/2023)  Transportation Needs: No Transportation Needs (01/20/2023)  Utilities: Not At Risk (01/20/2023)  Alcohol Screen: Low Risk  (12/10/2022)  Depression (PHQ2-9): Low Risk  (12/10/2022)  Financial Resource Strain: Low Risk  (12/11/2022)  Physical Activity: Inactive (12/11/2022)  Social Connections: Socially Isolated (12/11/2022)  Stress: Patient Unable To Answer (12/11/2022)  Tobacco Use: Medium Risk (01/20/2023)     Readmission Risk  Interventions     No data to display

## 2023-01-21 NOTE — Progress Notes (Signed)
ANTICOAGULATION CONSULT NOTE -  Pharmacy Consult for heparin=> eliquis Indication: atrial fibrillation  No Known Allergies  Patient Measurements: Height: 6\' 3"  (190.5 cm) Weight: 95 kg (209 lb 7 oz) IBW/kg (Calculated) : 84.5 Heparin Dosing Weight: 92 Kg  Vital Signs: Temp: 98.9 F (37.2 C) (06/13 0745) Temp Source: Oral (06/13 0745) BP: 123/51 (06/13 0700) Pulse Rate: 74 (06/13 0700)  Labs: Recent Labs    01/19/23 2045 01/19/23 2229 01/20/23 0500 01/20/23 0846 01/20/23 1807 01/21/23 0541  HGB 10.4*  --  10.0* 10.3*  --  9.1*  HCT 32.5*  --  30.6* 31.7*  --  27.7*  PLT 237  --  258 277  --  251  HEPARINUNFRC  --   --   --  0.21* 0.24* 0.32  CREATININE 1.20  --  0.98  --   --   --   CKTOTAL  --  1,377* 1,030*  --   --   --   TROPONINIHS 86* 81*  --   --   --   --      Estimated Creatinine Clearance: 75.4 mL/min (by C-G formula based on SCr of 0.98 mg/dL).   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 No oral anticoagulation reported prior to admission. Hgb low 10.4 and PLT WNL. Pharmacy consulted to start heparin infusion.  Hgb 10.4> 9.1, no bleeding noted. Possibly dilutional  Heparin level 0.32 therapeutic. No issues with infusion   Goal of Therapy:  Heparin level 0.3-0.7 units/ml Monitor platelets by anticoagulation protocol: Yes   Plan:  D/C heparin Eliquis 5mg  po BID Continue to monitor H&H and platelets Educate on eliquis  Elder Cyphers, BS Pharm D, BCPS Clinical Pharmacist 01/21/2023, 9:06 AM

## 2023-01-22 ENCOUNTER — Non-Acute Institutional Stay (SKILLED_NURSING_FACILITY): Payer: Medicare Other | Admitting: Adult Health

## 2023-01-22 ENCOUNTER — Encounter: Payer: Self-pay | Admitting: Adult Health

## 2023-01-22 DIAGNOSIS — N138 Other obstructive and reflux uropathy: Secondary | ICD-10-CM

## 2023-01-22 DIAGNOSIS — I951 Orthostatic hypotension: Secondary | ICD-10-CM | POA: Diagnosis not present

## 2023-01-22 DIAGNOSIS — D508 Other iron deficiency anemias: Secondary | ICD-10-CM | POA: Diagnosis not present

## 2023-01-22 DIAGNOSIS — R600 Localized edema: Secondary | ICD-10-CM

## 2023-01-22 DIAGNOSIS — G9341 Metabolic encephalopathy: Secondary | ICD-10-CM | POA: Diagnosis not present

## 2023-01-22 DIAGNOSIS — T796XXS Traumatic ischemia of muscle, sequela: Secondary | ICD-10-CM

## 2023-01-22 DIAGNOSIS — N401 Enlarged prostate with lower urinary tract symptoms: Secondary | ICD-10-CM | POA: Diagnosis not present

## 2023-01-22 DIAGNOSIS — E43 Unspecified severe protein-calorie malnutrition: Secondary | ICD-10-CM | POA: Diagnosis not present

## 2023-01-22 DIAGNOSIS — I4891 Unspecified atrial fibrillation: Secondary | ICD-10-CM | POA: Diagnosis not present

## 2023-01-22 DIAGNOSIS — G7080 Lambert-Eaton syndrome, unspecified: Secondary | ICD-10-CM | POA: Diagnosis not present

## 2023-01-22 DIAGNOSIS — I739 Peripheral vascular disease, unspecified: Secondary | ICD-10-CM

## 2023-01-22 DIAGNOSIS — E785 Hyperlipidemia, unspecified: Secondary | ICD-10-CM | POA: Diagnosis not present

## 2023-01-22 NOTE — Telephone Encounter (Signed)
Spoke with Tammy. No previous records in chart.

## 2023-01-22 NOTE — Progress Notes (Signed)
Location:  [facility] Room Number: 136 Place of Service:  SNF ([id number])   CODE STATUS: full   No Known Allergies  Chief Complaint  Patient presents with   Hospitalization Follow-up    HPI:  He is a 77 year old man who has been hospitalized from 01-19-23 through 01-21-23. His medical history includes: seizure; lambert eaton syndrome; anemia. He represented to the ED after being found on the floor during a welfare call. He was found on the floor patient has no memory of fall. He went ot sleep and woke up in the hospital.   He was found to have new onset atrial fibrillation. His tsh was normal, EF 50-55% had no regional wall maladies; diastolic parameters are normal moderate pulmonary hypertension. Had mildly elevated troponin level felt to demand ischemia. There were no signs of infection.  he was started on IV cardizem and heparin and was transitioned to oral eliquis and cardizem.   2. Acute metabolic encephalopathy: family states he is back to baseline his MMSE was 24/30.  He is here for short term rehab with his goal to return back home. 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 Jennell Corner external and internal hemorrhoids, redundant left colon   ESOPHAGEAL DILATION N/A 11/12/2014   Procedure: ESOPHAGEAL DILATION;  Surgeon: West Bali, MD;  Location: [facility];  Service: Endoscopy;  Laterality: N/A;   ESOPHAGOGASTRODUODENOSCOPY N/A 11/12/2014   Dr.  Fields:mild non-erosive gastritis. negative H.pylori    None      Social History   Socioeconomic History   Marital status: Legally Separated    Spouse name: Not on file   Number of children: 2   Years of education: 12   Highest education level: 12th grade  Occupational History   Not on file  Tobacco Use   Smoking status: Former    Packs/day: 0.50    Years: 21.00    Additional pack years: 0.00    Total pack years: 10.50    Types: Cigarettes    Start date: 01/22/1963    Quit date: 08/11/1983    Years since quitting: 39.4    Passive exposure: Past   Smokeless tobacco: Never   Tobacco comments:    Verified by Daughter, [name]  Vaping Use   Vaping Use: Never used  Substance and Sexual Activity   Alcohol use: No    Alcohol/week: 0.0 standard drinks of alcohol    Comment: remote past, history of ETOH abuse.    Drug use: No   Sexual activity: Not Currently    Partners: Female  Other Topics Concern   Not on file  Social History Narrative   Not on file   Social Determinants of Health   Financial Resource Strain: Low Risk  (12/11/2022)   Overall Financial Resource Strain (CARDIA)    Difficulty of Paying Living Expenses: Not hard at all  Food Insecurity: No Food Insecurity (01/20/2023)   Hunger Vital Sign  Worried About Programme researcher, broadcasting/film/video in the Last Year: Never true    Ran Out of Food in the Last Year: Never true  Transportation Needs: No Transportation Needs (01/20/2023)   PRAPARE - Administrator, Civil Service (Medical): No    Lack of Transportation (Non-Medical): No  Physical Activity: Inactive (12/11/2022)   Exercise Vital Sign    Days of Exercise per Week: 0 days    Minutes of Exercise per Session: 0 min  Stress: Patient Unable To Answer (12/11/2022)   [employer] of Occupational Health - Occupational Stress Questionnaire    Feeling of Stress : Patient unable to answer  Social Connections: Socially Isolated (12/11/2022)   Social Connection and  Isolation Panel [NHANES]    Frequency of Communication with Friends and Family: More than three times a week    Frequency of Social Gatherings with Friends and Family: More than three times a week    Attends Religious Services: Never    Database administrator or Organizations: No    Attends Banker Meetings: Never    Marital Status: Separated  Intimate Partner Violence: Not At Risk (01/20/2023)   Humiliation, Afraid, Rape, and Kick questionnaire    Fear of Current or Ex-Partner: No    Emotionally Abused: No    Physically Abused: No    Sexually Abused: No   Family History  Problem Relation Age of Onset   Dementia Mother    Crohn's disease Mother    Cancer Father    Sickle cell anemia Daughter    Stomach cancer Maternal Grandfather    Colon cancer Neg Hx       VITAL SIGNS BP 134/71   Pulse 89   Temp 98.7 F (37.1 C)   Resp 20   Ht 6\' 3"  (1.905 m)   Wt 204 lb 12.8 oz (92.9 kg)   SpO2 92%   BMI 25.60 kg/m   Outpatient Encounter Medications as of 01/22/2023  Medication Sig Note   acetaminophen (TYLENOL) 500 MG tablet Take 500 mg by mouth every 6 (six) hours as needed.    Amifampridine Phosphate 10 MG TABS Take 15 mg by mouth 4 (four) times daily. 01/20/2023: LF:12/31/2022 10 MG TABS (disp 120, 30d supply)   apixaban (ELIQUIS) 5 MG TABS tablet Take 1 tablet (5 mg total) by mouth 2 (two) times daily.    atorvastatin (LIPITOR) 40 MG tablet Take 1 tablet (40 mg total) by mouth daily. 01/20/2023: LF:11/09/2022 40 MG TABS (disp 90, 90d supply)   Cholecalciferol (D3 5000) 125 MCG (5000 UT) capsule Take 5,000 Units by mouth daily.    diltiazem (CARDIZEM CD) 240 MG 24 hr capsule Take 1 capsule (240 mg total) by mouth daily.    furosemide (LASIX) 40 MG tablet TAKE 1 TABLET BY MOUTH ON MONDAY, WEDNESDAY AND FRIDAY - MAY TAKE ADDITIONAL AS NEEDED FOR SWELLING. PT NEEDS APPT FOR FURTHER REFILLS (Patient taking differently: Take 40 mg by mouth. TAKE 1 TABLET BY MOUTH ON MONDAY,  WEDNESDAY AND FRIDAY - MAY TAKE ADDITIONAL AS NEEDED FOR SWELLING. PT NEEDS APPT FOR FURTHER REFILLS)    midodrine (PROAMATINE) 10 MG tablet Take 1 tablet (10 mg total) by mouth 3 (three) times daily with meals.    tamsulosin (FLOMAX) 0.4 MG CAPS capsule Take 1 capsule (0.4 mg total) by mouth daily after supper.    trolamine salicylate (ASPERCREME) 10 % cream Apply 1 application topically as needed for muscle pain.    No facility-administered encounter  medications on file as of 01/22/2023.     SIGNIFICANT DIAGNOSTIC EXAMS  TODAY  01-19-23: ct of cervical spine:  Moderate degenerative disc disease and facet hypertrophy without acute fracture or subluxation.  01-19-23: ct of head:  1. No acute intracranial abnormality. 2. Mild chronic small vessel ischemia.  01-20-23: 2-d echo:  1. Left ventricular ejection fraction, by estimation, is 50 to 55%. The  left ventricle has low normal function. The left ventricle has no regional  wall motion abnormalities. Left ventricular diastolic parameters were  normal.   2. Right ventricular systolic function is normal. The right ventricular  size is normal. There is moderately elevated pulmonary artery systolic  pressure.   3. The mitral valve is normal in structure. No evidence of mitral valve  regurgitation. No evidence of mitral stenosis.   4. The tricuspid valve is abnormal.   5. The aortic valve is tricuspid. Aortic valve regurgitation is not  visualized. No aortic stenosis is present.   6. The inferior vena cava is dilated in size with <50% respiratory  variability, suggesting right atrial pressure of 15 mmHg.   LABS REVIEWED:   11-06-22: hgb A1c 6.2; tsh 0.510 01-19-23: wbc 10.8; hgb 10.4; hct 32.5; mcv 71.4 plt 237; glucose 102; bun 21; creat 1.20; k+ 3.5; na++ 141; ca 8.6; gfr >60; protein 7.8; albumin 3.1; CK 1377 01-20-23: wbc 9.8; hgb 10.0; hgb 30.6; mcv 69.7 plt 258; glucose 105; bun 18; creat 0.98; k+ 3.6; na++ 141; ca 8.0; gfr >60; protein 6.5  albumin 2.7; mag 2.0; tsh 0.489 CK 1030 01-21-23: wbc 8.0; hgb 9.1; hct 27.7; mcv 69.9 plt 251   Review of Systems  Constitutional:  Negative for malaise/fatigue.  Respiratory:  Negative for cough and shortness of breath.   Cardiovascular:  Negative for chest pain, palpitations and leg swelling.  Gastrointestinal:  Negative for abdominal pain, constipation and heartburn.  Musculoskeletal:  Negative for back pain, joint pain and myalgias.  Skin: Negative.   Neurological:  Negative for dizziness.  Psychiatric/Behavioral:  The patient is not nervous/anxious.     Physical Exam Constitutional:      General: He is not in acute distress.    Appearance: He is well-developed. He is not diaphoretic.  Neck:     Thyroid: No thyromegaly.  Cardiovascular:     Rate and Rhythm: Normal rate. Rhythm irregular.     Pulses: Normal pulses.     Heart sounds: Normal heart sounds.  Pulmonary:     Effort: Pulmonary effort is normal. No respiratory distress.     Breath sounds: Normal breath sounds.  Abdominal:     General: Bowel sounds are normal. There is no distension.     Palpations: Abdomen is soft.     Tenderness: There is no abdominal tenderness.  Musculoskeletal:        General: Normal range of motion.     Cervical back: Neck supple.     Right lower leg: No edema.     Left lower leg: No edema.  Lymphadenopathy:     Cervical: No cervical adenopathy.  Skin:    General: Skin is warm and dry.  Neurological:     Mental Status: He is alert. Mental status is at baseline.  Psychiatric:        Mood and Affect: Mood normal.       ASSESSMENT/ PLAN:  TODAY  Atrial fibrillation with RVR: heart rate is stable will continue cardizem er 240 mg daily for rate control and eliquis 5 mg  twice daily   2.  Orthostatic hypotension: will continue midodrine 10 mg three times daily   3. PAD (peripheral artery disease): is on statin and eliquis  4. Acute metabolic encephalopathy: he is back to his  baseline  5. LEMS (lambert eaton myasthenic syndrome) is on amifampridine phosphate 15 mg four times daily   6.  Other iron deficiency anemia: Hgb 9.1 max was 10.8 in the hospital  7. Hyperlipidemia ldl goal <100: will continue lipitor 40 mg daily   8. BPH with obstruction/lower urinary tract symptoms: is on flomax 0.4 mg daily   9. Bilateral lower extremity edema: is on lasix 3 days per week  10. Severe protein calorie malnutrition: protein 6.5 albumin 2.7 will begin prostat three times daily   11. Traumatic rhabdomyolysis: CK 1030; is coming down.    Will check cbc; cmp; CK  Synthia Innocent NP [facility]  call [phone]

## 2023-01-25 ENCOUNTER — Other Ambulatory Visit (HOSPITAL_COMMUNITY)
Admission: RE | Admit: 2023-01-25 | Discharge: 2023-01-25 | Disposition: A | Discharge status: Home / Self Care | Payer: Medicare Other | Source: Skilled Nursing Facility | Attending: Adult Health | Admitting: Adult Health

## 2023-01-25 DIAGNOSIS — G7080 Lambert-Eaton syndrome, unspecified: Secondary | ICD-10-CM | POA: Insufficient documentation

## 2023-01-25 LAB — COMPREHENSIVE METABOLIC PANEL
ALT: 33 U/L (ref 0–44)
AST: 33 U/L (ref 15–41)
Albumin: 2.9 g/dL — ABNORMAL LOW (ref 3.5–5.0)
Alkaline Phosphatase: 57 U/L (ref 38–126)
Anion gap: 10 (ref 5–15)
BUN: 8 mg/dL (ref 8–23)
CO2: 25 mmol/L (ref 22–32)
Calcium: 8.4 mg/dL — ABNORMAL LOW (ref 8.9–10.3)
Chloride: 100 mmol/L (ref 98–111)
Creatinine, Ser: 1.17 mg/dL (ref 0.61–1.24)
GFR, Estimated: >60 mL/min (ref >60)
Glucose, Bld: 93 mg/dL (ref 70–99)
Potassium: 3.7 mmol/L (ref 3.5–5.1)
Sodium: 135 mmol/L (ref 135–145)
Total Bilirubin: 1 mg/dL (ref 0.3–1.2)
Total Protein: 6.7 g/dL (ref 6.5–8.1)

## 2023-01-25 LAB — CK: Total CK: 73 U/L (ref 49–397)

## 2023-01-25 LAB — CBC
HCT: 34.4 % — ABNORMAL LOW (ref 39.0–52.0)
Hemoglobin: 11.3 g/dL — ABNORMAL LOW (ref 13.0–17.0)
MCH: 22.8 pg — ABNORMAL LOW (ref 26.0–34.0)
MCHC: 32.8 g/dL (ref 30.0–36.0)
MCV: 69.4 fL — ABNORMAL LOW (ref 80.0–100.0)
Platelets: 315 10*3/uL (ref 150–400)
RBC: 4.96 MIL/uL (ref 4.22–5.81)
RDW: 14.7 % (ref 11.5–15.5)
WBC: 8.9 10*3/uL (ref 4.0–10.5)
nRBC: 0 % (ref 0.0–0.2)

## 2023-01-26 ENCOUNTER — Non-Acute Institutional Stay (SKILLED_NURSING_FACILITY): Payer: Medicare Other | Admitting: Internal Medicine

## 2023-01-26 ENCOUNTER — Encounter (HOSPITAL_COMMUNITY): Payer: Self-pay | Admitting: Oncology

## 2023-01-26 ENCOUNTER — Encounter: Payer: Self-pay | Admitting: Internal Medicine

## 2023-01-26 DIAGNOSIS — E43 Unspecified severe protein-calorie malnutrition: Secondary | ICD-10-CM | POA: Diagnosis not present

## 2023-01-26 DIAGNOSIS — I4891 Unspecified atrial fibrillation: Secondary | ICD-10-CM

## 2023-01-26 DIAGNOSIS — T796XXS Traumatic ischemia of muscle, sequela: Secondary | ICD-10-CM | POA: Diagnosis not present

## 2023-01-26 DIAGNOSIS — G9341 Metabolic encephalopathy: Secondary | ICD-10-CM

## 2023-01-26 DIAGNOSIS — R7989 Other specified abnormal findings of blood chemistry: Secondary | ICD-10-CM

## 2023-01-26 DIAGNOSIS — D508 Other iron deficiency anemias: Secondary | ICD-10-CM | POA: Diagnosis not present

## 2023-01-26 NOTE — Progress Notes (Signed)
NURSING HOME LOCATION:  [facility] ROOM NUMBER: [id number]  CODE STATUS:  Full Code  PCP: Christel Mormon MD  This is a comprehensive admission note to this SNFperformed on this date less than 30 days from date of admission. Included are preadmission medical/surgical history; reconciled medication list; family history; social history and comprehensive review of systems.  Corrections and additions to the records were documented. Comprehensive physical exam was also performed. Additionally a clinical summary was entered for each active diagnosis pertinent to this admission in the Problem List to enhance continuity of care.  HPI: He was hospitalized 6/11 - 01/21/2023, admitted with acute metabolic encephalopathy manifesting as disorientation in the context of atrial fibrillation with RVR.  He was found down during a welfare call; he could provide no history as to the events prior to his being found on the floor.  History was compromised by his chronic speech abnormality which was described as "garbled."  Rhabdomyolysis was documented with peak CK of 1377 ; this is improved with IV fluids. Troponin was mildly elevated at 86, attributed to demand ischemia.  CHADS2 DS 2 vas score was 4. He received IV heparin and IV Cardizem.  These were transitioned to oral Eliquis and p.o. Cardizem.  He had previously been on aspirin and statin for his PAD but the aspirin was discontinued when the Eliquis was initiated. Initial H/H was 10.4/32.5; prior to discharge values were 9.1/27.7 with hypochromic, microcytic indices.  Albumin was 2.7 but total protein was low normal at 6.5.  Total bilirubin was 1.7 with peak AST of 64 with subsequent normalization and normal ALT's serially..  Nadir calcium was 8.0. Prior to discharge his daughter stated that his mental status was back at baseline.  There was concern for possible underlying dementia.  MMSE score was 24 out of 30.  Outpatient follow-up to assess mental  status with his PCP and neurologist was recommended.  Past medical and surgical history: Includes Lambert-Eaton myasthenic syndrome, iron deficiency anemia, PAD, history of seizure disorder  Social history: Nondrinker, non-smoker.  Family history: Noncontributory due to advanced age.   Review of systems: He denies any definite neurologic or cardiac prodrome prior to fall.  He states "I got kind of dizzy like and sat down."  He believes he was on the floor for an hour as his legs were "too weak" for him to stand.  Apparently he may have been off his myasthenic drug for possibly up to 48 hours.  His responses are somewhat vague about compliance PTA.  Review of systems is essentially totally negative.  Although he denies snoring or apnea he describes what most about a sleep study in 2015 which was negative.  Constitutional: No fever, significant weight change, fatigue  Eyes: No redness, discharge, pain, vision change ENT/mouth: No nasal congestion, purulent discharge, earache, change in hearing, sore throat  Cardiovascular: No chest pain, palpitations, paroxysmal nocturnal dyspnea, claudication, edema  Respiratory: No cough, sputum production, hemoptysis, DOE, significant snoring, apnea Gastrointestinal: No heartburn, dysphagia, abdominal pain, nausea /vomiting, rectal bleeding, melena, change in bowels Genitourinary: No dysuria, hematuria, pyuria, incontinence, nocturia Musculoskeletal: No joint stiffness, joint swelling, weakness, pain Dermatologic: No rash, pruritus, change in appearance of skin Neurologic: No dizziness, headache, syncope, seizures, numbness, tingling Psychiatric: No significant anxiety, depression, insomnia, anorexia Endocrine: No change in hair/skin/nails, excessive thirst, excessive hunger, excessive urination  Hematologic/lymphatic: No significant bruising, lymphadenopathy, abnormal bleeding Allergy/immunology: No itchy/watery eyes, significant sneezing, urticaria,  angioedema  Physical exam:  Pertinent or positive  findings: He has a full beard and mustache.  Arcus senilis is present.  He has only 3 remaining mandibular teeth and these are black and crusted.  He is wearing an upper plate.  The mouth sags to the left.  Rhythm is regular with an occasional premature.  There is splitting of the first heart sound.  He has fine rales at the bases.  Breath sounds are decreased superiorly.  Abdomen is protuberant.  Pedal pulses are decreased to palpation.  He has dense hyperpigmented sclerotic changes over the shins.  Strength opposition is equal and good in all extremities.  General appearance: Adequately nourished; no acute distress, increased work of breathing is present.   Lymphatic: No lymphadenopathy about the head, neck, axilla. Eyes: No conjunctival inflammation or lid edema is present. There is no scleral icterus. Ears:  External ear exam shows no significant lesions or deformities.   Nose:  External nasal examination shows no deformity or inflammation. Nasal mucosa are pink and moist without lesions, exudates Oral exam: Lips and gums are healthy appearing.There is no oropharyngeal erythema or exudate. Neck:  No thyromegaly, masses, tenderness noted.    Heart:  Normal rate and regular rhythm. S1 and S2 normal without gallop, murmur, click, rub.  Lungs: Chest clear to auscultation without wheezes, rhonchi, rales, rubs. Abdomen: Bowel sounds are normal.  Abdomen is soft and nontender with no organomegaly, hernias, masses. GU: Deferred  Extremities:  No cyanosis, clubbing, edema. Neurologic exam:  Strength equal  in upper & lower extremities. Balance, Rhomberg, finger to nose testing could not be completed due to clinical state Deep tendon reflexes are equal Skin: Warm & dry w/o tenting. No significant lesions or rash.  See clinical summary under each active problem in the Problem List with associated updated therapeutic plan

## 2023-01-26 NOTE — Assessment & Plan Note (Addendum)
Current albumin is 2.7; total protein is low normal at 6.5.  He is on protein supplement.  Nutritionist to monitor here at the SNF.

## 2023-01-26 NOTE — Assessment & Plan Note (Signed)
Today rhythm is regular except for occasional premature beat and rate is well-controlled.  No change indicated in current cardiac regimen.

## 2023-01-26 NOTE — Assessment & Plan Note (Signed)
He denies any myalgias.  Strength to opposition is good and equal in all extremities.

## 2023-01-26 NOTE — Patient Instructions (Signed)
See assessment and plan under each diagnosis in the problem list and acutely for this visit 

## 2023-01-26 NOTE — Assessment & Plan Note (Addendum)
Predischarge H/H 9.1/27.7 with hypochromic, microcytic indices.  No bleeding dyscrasias reported by him or staff here at the SNF.  Continue to monitor.

## 2023-01-26 NOTE — Assessment & Plan Note (Addendum)
Troponin mildly elevated 86 attributed to demand ischemia.  No evidence of active anginal or CHF process on exam.

## 2023-01-26 NOTE — Assessment & Plan Note (Addendum)
His history is somewhat vague in reference to the events.  He states he did experience some dizziness and leg weakness prior to the event but no other cardiac or neurologic prodrome.  This may be in the context of noncompliance with his myasthenic medication.

## 2023-02-01 ENCOUNTER — Other Ambulatory Visit: Payer: Self-pay | Admitting: *Deleted

## 2023-02-01 ENCOUNTER — Encounter: Payer: Self-pay | Admitting: Internal Medicine

## 2023-02-01 DIAGNOSIS — R4189 Other symptoms and signs involving cognitive functions and awareness: Secondary | ICD-10-CM | POA: Insufficient documentation

## 2023-02-01 NOTE — Patient Outreach (Signed)
Per [facility] Mr. Etchison resides in [facility] facility. He was active with [facility] care coordination team prior to admission.   Update received from [place], [place] Child psychotherapist. Mr. Ramakrishnan does not have set dc date yet. However, anticipated transition plan is possibly home with his daughter.   Will update [facility] Child psychotherapist.   Will continue to follow while Mr. Vora resides in SNF.   Raiford Noble, MSN, RN,BSN [facility] Post Acute Care Coordinator [phone] (Direct dial)

## 2023-02-02 LAB — CBC WITH DIFFERENTIAL/PLATELET
Eosinophils Relative: 3 %
Immature Granulocytes: 1 %
Lymphocytes Relative: 14 %
Lymphs Abs: 1.4 10*3/uL (ref 0.7–4.0)
MCH: 22.8 pg — ABNORMAL LOW (ref 26.0–34.0)
MCHC: 32.7 g/dL (ref 30.0–36.0)
MCV: 69.7 fL — ABNORMAL LOW (ref 80.0–100.0)
Monocytes Absolute: 1.2 10*3/uL — ABNORMAL HIGH (ref 0.1–1.0)
Monocytes Relative: 12 %
Neutro Abs: 6.9 10*3/uL (ref 1.7–7.7)
Neutrophils Relative %: 70 %
RDW: 14.3 % (ref 11.5–15.5)
WBC: 9.8 10*3/uL (ref 4.0–10.5)
nRBC: 0 % (ref 0.0–0.2)

## 2023-02-03 ENCOUNTER — Ambulatory Visit: Payer: Self-pay | Admitting: *Deleted

## 2023-02-03 ENCOUNTER — Encounter: Payer: Self-pay | Admitting: *Deleted

## 2023-02-03 ENCOUNTER — Telehealth: Payer: Self-pay | Admitting: Internal Medicine

## 2023-02-03 NOTE — Telephone Encounter (Signed)
Tammy called from [facility] [phone] patient will be discharged on Saturday, 06.29.2024.

## 2023-02-03 NOTE — Patient Outreach (Signed)
Care Coordination   Follow Up Visit Note   02/03/2023  Name: Erik [name] MRN: [id number] DOB: 1946/07/01  Erik [name] is a 77 y.o. year old male who sees [name], [name], MD for primary care. I spoke with Erik [name] by phone today.  What matters to the patients health and wellness today?  Receive Assistance Applying for Personal Care Services.   Goals Addressed             This Visit's Progress    Receive Assistance Applying for Personal Care Services.   On track    Care Coordination Interventions:  Interventions Today    Flowsheet Row Most Recent Value  Chronic Disease   Chronic disease during today's visit Other  [Inability to Perform Activities of Daily Living Independently, Prediabetes, Cognitive Impairment & Pharyngoesophageal Phase Dysphagia]  General Interventions   General Interventions Discussed/Reviewed General Interventions Discussed, Labs, Vaccines, Doctor Visits, Health Screening, General Interventions Reviewed, Annual Eye Exam, Durable Medical Equipment (DME), Community Resources, Level of Care, Communication with  [Encouraged]  Labs Hgb A1c annually  [Encouraged]  Vaccines COVID-19, Flu, Pneumonia, RSV, Shingles, Tetanus/Pertussis/Diphtheria  [Encouraged]  Doctor Visits Discussed/Reviewed Doctor Visits Discussed, Specialist, Doctor Visits Reviewed, Annual Wellness Visits, PCP  [Encouraged]  Health Screening Bone Density, Colonoscopy, Prostate  [Encouraged]  PCP/Specialist Visits Compliance with follow-up visit  [Encouraged]  Communication with PCP/Specialists, RN  Level of Care Adult Daycare, Personal Care Services, Applications, Assisted Living, Skilled Nursing Facility  [Encouraged]  Applications Personal Care Services  [Encouraged]  Exercise Interventions   Exercise Discussed/Reviewed Exercise Discussed, Assistive device use and maintanence, Exercise Reviewed, Physical Activity  [Encouraged]  Physical Activity Discussed/Reviewed  Physical Activity Discussed, Home Exercise Program (HEP), Physical Activity Reviewed, Types of exercise  [Encouraged]  Education Interventions   Education Provided Provided Therapist, sports, Provided Web-based Education, Provided Education  Provided Verbal Education On Development worker, community, [employer], When to see the doctor, Mental Health/Coping with Illness, Nutrition, Eye Care, Labs, Applications, Exercise, Medication  [Encouraged]  Applications Personal Care Services  [Encouraged]  Mental Health Interventions   Mental Health Discussed/Reviewed Mental Health Discussed, Anxiety, Depression, Grief and Loss, Substance Abuse, Suicide, Other, Crisis, Coping Strategies, Mental Health Reviewed  [Domestic Violence]  Nutrition Interventions   Nutrition Discussed/Reviewed Nutrition Discussed, Adding fruits and vegetables, Increaing proteins, Decreasing fats, Decreasing salt, Nutrition Reviewed, Carbohydrate meal planning, Decreasing sugar intake, Supplmental nutrition, Portion sizes, Fluid intake  [Encouraged]  Pharmacy Interventions   Pharmacy Dicussed/Reviewed Pharmacy Topics Discussed, Pharmacy Topics Reviewed, Medication Adherence, Affording Medications  [Encouraged]  Safety Interventions   Safety Discussed/Reviewed Safety Discussed, Safety Reviewed, Fall Risk, Home Safety  [Encouraged]  Home Safety Assistive Devices, Need for home safety assessment, Refer for community resources  [Encouraged]  Advanced Directive Interventions   Advanced Directives Discussed/Reviewed Advanced Directives Discussed  [Encouraged]     Active Listening & Reflection Utilized.  Verbalization of Feelings Encouraged.  Emotional Support Provided. Problem Solving Interventions Activated. Task-Centered Solutions Implemented.   Solution-Focused Strategies Employed. Acceptance & Commitment Therapy Performed. Cognitive Behavioral Therapy Initiated. Client-Centered Therapy Indicated. Confirmed Discharge from [facility] (# [phone]) on 01/21/2023 & Admission to [facility] [phone]), to Receive Short-Term Rehabilitative Services. Confirmed Tentative Discharge from [facility] [phone]) on 02/06/2023, with Plans to Return Home to Live with Daughter, [name]. Confirmed Disinterest in Receiving Assistance with Long-Term Care Placement into An Assisted Living Facility of Choice, Upon Discharge from [facility] (#  [phone]). Confirmed Disinterest in Receiving Assistance Arranging Home Health Services or Durable Medical Equipment, Upon Discharge from [facility] [phone]). Encouraged Careers information officer with CSW (# [phone]), if You Have Questions, Need Assistance, or If Additional Social Work Needs Are Identified Between Now & Our Next Scheduled Follow-Up Outreach Call.      SDOH assessments and interventions completed:  Yes.  Care Coordination Interventions:  Yes, provided.   Follow up plan: Follow up call scheduled for 02/17/2023 at 3:00 pm.  Encounter Outcome:  Pt. Visit Completed.   Danford Bad, BSW, MSW, LCSW  Licensed Restaurant manager, [employer] [facility]  Mailing [place] [address] [place], [place] [address] Physical Address-[address] [place], [place] [address] Toll Free Main # [phone] Fax # [phone] Cell # [phone] [email or web address]

## 2023-02-03 NOTE — Patient Instructions (Signed)
Visit Information  Thank you for taking time to visit with me today. Please don't hesitate to contact me if I can be of assistance to you.   Following are the goals we discussed today:   Goals Addressed             This Visit's Progress    Receive Assistance Applying for Personal Care Services.   On track    Care Coordination Interventions:  Interventions Today    Flowsheet Row Most Recent Value  Chronic Disease   Chronic disease during today's visit Other  [Inability to Perform Activities of Daily Living Independently, Prediabetes, Cognitive Impairment & Pharyngoesophageal Phase Dysphagia]  General Interventions   General Interventions Discussed/Reviewed General Interventions Discussed, Labs, Vaccines, Doctor Visits, Health Screening, General Interventions Reviewed, Annual Eye Exam, Durable Medical Equipment (DME), Community Resources, Level of Care, Communication with  [Encouraged]  Labs Hgb A1c annually  [Encouraged]  Vaccines COVID-19, Flu, Pneumonia, RSV, Shingles, Tetanus/Pertussis/Diphtheria  [Encouraged]  Doctor Visits Discussed/Reviewed Doctor Visits Discussed, Specialist, Doctor Visits Reviewed, Annual Wellness Visits, PCP  [Encouraged]  Health Screening Bone Density, Colonoscopy, Prostate  [Encouraged]  PCP/Specialist Visits Compliance with follow-up visit  [Encouraged]  Communication with PCP/Specialists, RN  Level of Care Adult Daycare, Personal Care Services, Applications, Assisted Living, Skilled Nursing Facility  [Encouraged]  Applications Personal Care Services  [Encouraged]  Exercise Interventions   Exercise Discussed/Reviewed Exercise Discussed, Assistive device use and maintanence, Exercise Reviewed, Physical Activity  [Encouraged]  Physical Activity Discussed/Reviewed Physical Activity Discussed, Home Exercise Program (HEP), Physical Activity Reviewed, Types of exercise  [Encouraged]  Education Interventions   Education Provided Provided Therapist, sports,  Provided Web-based Education, Provided Education  Provided Verbal Education On Development worker, community, [employer], When to see the doctor, Mental Health/Coping with Illness, Nutrition, Eye Care, Labs, Applications, Exercise, Medication  [Encouraged]  Applications Personal Care Services  [Encouraged]  Mental Health Interventions   Mental Health Discussed/Reviewed Mental Health Discussed, Anxiety, Depression, Grief and Loss, Substance Abuse, Suicide, Other, Crisis, Coping Strategies, Mental Health Reviewed  [Domestic Violence]  Nutrition Interventions   Nutrition Discussed/Reviewed Nutrition Discussed, Adding fruits and vegetables, Increaing proteins, Decreasing fats, Decreasing salt, Nutrition Reviewed, Carbohydrate meal planning, Decreasing sugar intake, Supplmental nutrition, Portion sizes, Fluid intake  [Encouraged]  Pharmacy Interventions   Pharmacy Dicussed/Reviewed Pharmacy Topics Discussed, Pharmacy Topics Reviewed, Medication Adherence, Affording Medications  [Encouraged]  Safety Interventions   Safety Discussed/Reviewed Safety Discussed, Safety Reviewed, Fall Risk, Home Safety  [Encouraged]  Home Safety Assistive Devices, Need for home safety assessment, Refer for community resources  [Encouraged]  Advanced Directive Interventions   Advanced Directives Discussed/Reviewed Advanced Directives Discussed  [Encouraged]     Active Listening & Reflection Utilized.  Verbalization of Feelings Encouraged.  Emotional Support Provided. Problem Solving Interventions Activated. Task-Centered Solutions Implemented.   Solution-Focused Strategies Employed. Acceptance & Commitment Therapy Performed. Cognitive Behavioral Therapy Initiated. Client-Centered Therapy Indicated. Confirmed Discharge from [facility] (# [phone]) on 01/21/2023 & Admission to [facility] [phone]), to Receive Short-Term Rehabilitative Services. Confirmed Tentative Discharge  from [facility] [phone]) on 02/06/2023, with Plans to Return Home to Live with Daughter, [name]. Confirmed Disinterest in Receiving Assistance with Long-Term Care Placement into An Assisted Living Facility of Choice, Upon Discharge from [facility] [phone]). Confirmed Disinterest in Receiving Assistance Arranging Home Health Services or Durable Medical Equipment, Upon Discharge from [facility] [phone]). Encouraged Careers information officer with CSW (# [phone]),  if You Have Questions, Need Assistance, or If Additional Social Work Needs Are Identified Between Now & Our Next Scheduled Follow-Up Outreach Call.      Our next appointment is by telephone on 02/17/2023 at 3:00 pm.  Please call the care guide team at [phone] if you need to cancel or reschedule your appointment.   If you are experiencing a Mental Health or Behavioral Health Crisis or need someone to talk to, please call the Suicide and Crisis Lifeline: 988 call the [place] National Suicide Prevention Lifeline: 509-289-3121 or TTY: 2130169754 TTY 401-146-1991) to talk to a trained counselor call 1-800-273-TALK (toll free, 24 hour hotline) go to [facility] [address], [place] [phone]) call the [facility] Crisis Line: [phone] call 911  Patient verbalizes understanding of instructions and care plan provided today and agrees to view in MyChart. Active MyChart status and patient understanding of how to access instructions and care plan via MyChart confirmed with patient.     Telephone follow up appointment with care management team member scheduled for:  02/17/2023 at 3:00 pm.  Danford Bad, BSW, MSW, LCSW  Licensed Clinical Social Worker  [employer] [facility]  Mailing [place], [place], [place] [address] Physical Address-[address] [place], [place] [address] Toll Free Main # [phone] Fax # [phone] Cell # [phone] [email or web address]

## 2023-02-04 ENCOUNTER — Encounter: Payer: Self-pay | Admitting: Internal Medicine

## 2023-02-04 ENCOUNTER — Telehealth: Payer: Self-pay | Admitting: Internal Medicine

## 2023-02-04 ENCOUNTER — Non-Acute Institutional Stay (SKILLED_NURSING_FACILITY): Payer: Medicare Other | Admitting: Internal Medicine

## 2023-02-04 DIAGNOSIS — I4891 Unspecified atrial fibrillation: Secondary | ICD-10-CM | POA: Diagnosis not present

## 2023-02-04 DIAGNOSIS — G7080 Lambert-Eaton syndrome, unspecified: Secondary | ICD-10-CM

## 2023-02-04 DIAGNOSIS — G9341 Metabolic encephalopathy: Secondary | ICD-10-CM | POA: Diagnosis not present

## 2023-02-04 DIAGNOSIS — E43 Unspecified severe protein-calorie malnutrition: Secondary | ICD-10-CM | POA: Diagnosis not present

## 2023-02-04 DIAGNOSIS — D509 Iron deficiency anemia, unspecified: Secondary | ICD-10-CM

## 2023-02-04 DIAGNOSIS — T796XXS Traumatic ischemia of muscle, sequela: Secondary | ICD-10-CM

## 2023-02-04 NOTE — Assessment & Plan Note (Signed)
He has no myalgia complaints and creatinine is now normal at 73.

## 2023-02-04 NOTE — Assessment & Plan Note (Addendum)
Current albumin is 2.9 with low normal total protein 6.7, both improved.

## 2023-02-04 NOTE — Telephone Encounter (Signed)
Spoke with Erik [name]

## 2023-02-04 NOTE — Progress Notes (Signed)
NURSING HOME LOCATION:  [facility] ROOM NUMBER: [id number] P  CODE STATUS: Full code  PCP: Delmar Landau, MD This is a nursing facility follow up visit for [name] discharge Saturday, 02/06/2023.  Interim medical record and care since last SNF visit was updated with review of diagnostic studies and change in clinical status since last visit were documented.  HPI: He was admitted to this facility 6/13 after being hospitalized 6/11 - 01/21/2023 with acute metabolic encephalopathy manifesting as disorientation in the context of A-fib with RVR.  At a welfare call he had been found down for unknown period of time.  Rhabdomyolysis was documented with a peak CK of 1377.  With rehydration this improved.  Troponin was mildly elevated 86 attributed to demand ischemia in the context of his A-fib with RVR. IV heparin and IV Cardizem were transitioned to oral Eliquis and p.o. Cardizem.  Aspirin maintenance was discontinued when the Eliquis was initiated. There was progression of mild anemia with initial H/H of 10.4/32.5 dropping to 9.1/27.7 at discharge.  Indices were hypochromic, microcytic.  Total protein was low normal at 6.5; albumin was 2.7.  Total bilirubin was 1.7 ; AST peaked at 64 with subsequent normalization.  ALT's were normal serially.  Hypocalcemia was present with a value of 8.0. According to family mental status returned to his baseline.  MMSE score was 24 out of 30.  Outpatient follow-up with PCP and Neurology was recommended to establish baseline mental status.  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  There is some question as to whether he had been taking his medicine Amifampridine for this condition regularly prior to the initial event PTA. He was discharged to this facility for rehab. Labs were repeated 6/17 here at the facility.  Calcium was 8.4, albumin 2.9, total protein 6.7, and CK 73.  H/H has improved significantly from 9.1/27.7  up to 11.3/34.4.  Microcytic indices persisted.  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  He cannot name his PCP or the name of the practice.  He states that his PCP will be leaving in September.  He told me repeatedly that he would be leaving Saturday.  He denies any active symptoms. 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  He states that he is seen at [facility] annually for his myasthenia syndrome, but again he cannot name his treating physician there.  Constitutional: No fever, significant weight change, fatigue  Eyes: No redness, discharge, pain, vision change ENT/mouth: No nasal congestion,  purulent discharge, earache, change in hearing, sore throat  Cardiovascular: No chest pain, palpitations, paroxysmal nocturnal dyspnea, claudication, edema  Respiratory: No cough, sputum production, hemoptysis, DOE, significant snoring, apnea   Gastrointestinal: No heartburn, dysphagia, abdominal pain, nausea /vomiting, rectal bleeding, melena, change in bowels Genitourinary: No dysuria, hematuria, pyuria, incontinence, nocturia Musculoskeletal: No joint stiffness, joint swelling, weakness, pain Dermatologic: No rash, pruritus, change in appearance of skin Neurologic: No dizziness, headache, syncope, seizures, numbness, tingling Psychiatric: No significant anxiety, depression, insomnia, anorexia Endocrine: No change in hair/skin/nails, excessive thirst, excessive hunger, excessive urination  Hematologic/lymphatic: No significant bruising, lymphadenopathy, abnormal bleeding Allergy/immunology: No itchy/watery eyes, significant sneezing, urticaria, angioedema  Physical exam:  Pertinent or positive findings: He does have graying of his hair, beard and mustache; but otherwise appears younger than stated age.  He has an upper plate.  He has  3 remaining mandibular teeth  which exhibit poor hygiene.  The mouth sags to the left.  There is slight splitting of the first heart sound.  Breath sounds are decreased.  Dorsalis pedis pulses are stronger than posterior tibial pulses.  He has nonpitting edema at the ankles.  He exhibits hyperpigmented keratotic changes of the shins.  There is slight clubbing of the nailbeds.  General appearance: Adequately nourished; no acute distress, increased work of breathing is present.   Lymphatic: No lymphadenopathy about the head, neck, axilla. Eyes: No conjunctival inflammation or lid edema is present. There is no scleral icterus. Ears:  External ear exam shows no significant lesions or deformities.   Nose:  External nasal examination shows no deformity or inflammation. Nasal mucosa are pink and moist without lesions, exudates Neck:  No thyromegaly, masses, tenderness noted.    Heart:  Normal rate and regular rhythm. S2 normal without gallop, murmur, click, rub .  Lungs:  without wheezes, rhonchi, rales, rubs. Abdomen: Bowel sounds are normal. Abdomen is soft and nontender with no organomegaly, hernias, masses. GU: Deferred  Skin: Warm & dry w/o tenting. No significant rash.  See summary under each active problem in the Problem List with associated updated therapeutic plan

## 2023-02-04 NOTE — Assessment & Plan Note (Addendum)
His microcytic anemia has improved significantly with H/H rising from 9.1/27.7 up to current values of 11.3/34.4.  No bleeding dyscrasias documented while at the SNF on Eliquis.

## 2023-02-04 NOTE — Telephone Encounter (Signed)
Clydie Braun called from [facility] [phone] need to know if Dr [name] will follow for health orders. Patient discharge 06.29.2024Call Clydie Braun to confirm

## 2023-02-04 NOTE — Assessment & Plan Note (Addendum)
Rhythm is regular on exam and rate well-controlled.  Eliquis prophylaxis will continue.  He did seem to have some difficulty comprehending that he was to be on "blood thinner" instead of aspirin. I told him I would check with [facility] to verify that he has an active prescription.

## 2023-02-04 NOTE — Assessment & Plan Note (Signed)
Encephalopathy has resolved but he has residual neurocognitive deficits.  He cannot remember the name of his PCP of the name of the practice.  I emphasized to him that he needed to be on the Eliquis in place of the aspirin.

## 2023-02-04 NOTE — Assessment & Plan Note (Signed)
Today he told me he is seen at [facility] annually.  He cannot provide the name of that physician.  I will send a copy of the discharge summary to Dr. Everlena Cooper.

## 2023-02-04 NOTE — Patient Instructions (Addendum)
See assessment and plan under each diagnosis in the problem list and acutely for this visit I told him I would verify that the Eliquis blood thinner was on file at the [facility] here in [place]. "Brett Canales" verified it had been sent 6/13 @ hospital discharge.

## 2023-02-08 ENCOUNTER — Other Ambulatory Visit: Payer: Self-pay | Admitting: *Deleted

## 2023-02-08 NOTE — Patient Outreach (Addendum)
Per [facility] [name] discharged from [facility] skilled nursing facility on 02/06/23. [name] was active with [facility] care coordination team with Child psychotherapist.   Secure communication sent to Erik [name], Social Worker to inquire about home health arrangements and disposition.  Telephone call made to Erik [name] [phone]. Patient identifiers confirmed. [name] endorses he discharged home with his daughter and son-in-law. Reports he is feeling better. Inquired whether he is interested in referral for [facility] RN for care coordination. [name] states he wants to leave services as they already are. Discussed [facility] Social Worker will follow as previous.  Will send update to [facility] Child psychotherapist.   Erik Noble, MSN, RN,BSN [facility] Post Acute Care Coordinator [phone] (Direct dial)

## 2023-02-08 NOTE — Patient Outreach (Addendum)
Post-Acute Care Coordinator follow up.   Update received from [facility] social worker. [name] transitioned home with daughter Erik [name]. Will have [facility] home health.   Raiford Noble, MSN, RN,BSN [facility] Post Acute Care Coordinator [phone] (Direct dial)

## 2023-02-09 ENCOUNTER — Telehealth: Payer: Self-pay

## 2023-02-09 ENCOUNTER — Inpatient Hospital Stay: Payer: Medicare Other | Admitting: Internal Medicine

## 2023-02-09 NOTE — Telephone Encounter (Signed)
TOC unsuccessful.

## 2023-02-09 NOTE — Telephone Encounter (Signed)
Transition Care Management Unsuccessful Follow-up Telephone Call  Date of discharge and from where:  02/06/2023  Attempts:  1st Attempt  Reason for unsuccessful TCM follow-up call:  Unable to leave message

## 2023-02-17 ENCOUNTER — Encounter: Payer: Self-pay | Admitting: *Deleted

## 2023-02-17 ENCOUNTER — Ambulatory Visit: Payer: Self-pay | Admitting: *Deleted
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
## 2023-03-01 ENCOUNTER — Other Ambulatory Visit: Payer: Self-pay | Admitting: Cardiology

## 2023-03-02 ENCOUNTER — Encounter: Payer: Self-pay | Admitting: *Deleted

## 2023-03-02 ENCOUNTER — Ambulatory Visit: Payer: Self-pay | Admitting: *Deleted
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
## 2023-03-08 ENCOUNTER — Ambulatory Visit (INDEPENDENT_AMBULATORY_CARE_PROVIDER_SITE_OTHER): Payer: Medicare Other | Admitting: *Deleted

## 2023-03-08 DIAGNOSIS — R4189 Other symptoms and signs involving cognitive functions and awareness: Secondary | ICD-10-CM

## 2023-03-08 DIAGNOSIS — D508 Other iron deficiency anemias: Secondary | ICD-10-CM

## 2023-03-08 DIAGNOSIS — I739 Peripheral vascular disease, unspecified: Secondary | ICD-10-CM

## 2023-03-08 NOTE — Chronic Care Management (AMB) (Signed)
Chronic Care Management   CCM RN Visit Note  03/08/2023 Name: Erik [name] MRN: [id number] DOB: Jan 30, 1946  Subjective: Erik [name] is a 77 y.o. year old male who is a primary care patient of [name], MD. The patient was referred to the Chronic Care Management team for assistance with care management needs subsequent to provider initiation of CCM services and plan of care.    Today's Visit:  Engaged with patient by telephone for follow up visit.        Goals Addressed             This Visit's Progress    CCM (COGNITIVE IMPAIRMENT) EXPECTED OUTCOME: MONITOR, SELF-MANAGE AND REDUCE SYMPTOMS OF COGNITIVE IMPAIRMENT       Current Barriers:  Knowledge Deficits related to Cognitive Impairment Chronic Disease Management support and education needs related to Cognitive Impairment Cognitive Deficits- LCSW is working with patient/ daughter Patient now resides with daughter [name], hospitalized 6/11-6/13/24 for disorientation, then discharged to short term SNF, continues working with Child psychotherapist for [employer] application, pt did not attend 02/09/23 appointment with primary care provider, daughter states she will call and reschedule this appointment, pt does not want to take some of his medications, does not wear compression hose  Planned Interventions: Reviewed medications including importance of taking as prescribed, Discussed importance of discussing diagnosis with provider, Discussed importance of attendance to all provider appointments, and Advised to contact provider for new or worsening symptoms Message sent to LCSW Danford Bad with request for update on PCS services Encouraged daughter [name] to call and make appointment with primary care provider  Symptom Management: Take medications as prescribed   Attend all scheduled provider appointments Call pharmacy for medication refills 3-7 days in advance of running out of medications Attend church or other  social activities Call provider office for new concerns or questions  call the Suicide and Crisis Lifeline: 988 call the [place] National Suicide Prevention Lifeline: 858-703-8545 or TTY: 859-125-9902 TTY 914-646-7989) to talk to a trained counselor call 1-800-273-TALK (toll free, 24 hour hotline) go to [facility] [address], [place] [phone]) call the [facility] Crisis Line: [phone] call 911 if experiencing a Mental Health or Behavioral Health Crisis  Call and make appointment with your primary care provider   Follow Up Plan: Telephone follow up appointment with care management team member scheduled for:   04/09/23 at 215 pm       CCM (IRON DEFICIENCY ANEMIA) EXPECTED OUTCOME: MONITOR, SELF-MANAGE AND REDUCE SYMPTOMS OF IRON DEFICIENCY ANEMIA       Current Barriers:  Knowledge Deficits related to Iron Deficiency Anemia management Chronic Disease Management support and education needs related to Iron Deficiency Anemia Cognitive Deficits No Advanced Directives in place  Planned Interventions: Evaluation of current treatment plan related to Iron Deficiency Anemia and patient's adherence to plan as established by provider Provided education to patient re: Iron Deficiency Anemia Advised patient to discuss change in health status/ symptoms with provider Assessed social determinant of health barriers Reviewed all upcoming scheduled appointments Reinforced importance of regular laboratory monitoring  Symptom Management: Take medications as prescribed   Attend all scheduled provider appointments Call pharmacy for medication refills 3-7 days in advance of running out of medications Attend church or other social activities Call provider office for new concerns or questions  call the Suicide and Crisis Lifeline: 988 call the [place] National Suicide Prevention Lifeline: 989-238-6498 or TTY: 435-173-0921 TTY 469-530-4640) to talk to a  trained counselor call 1-800-273-TALK (toll free, 24 hour hotline) go to [facility] [address], [place] [phone]) call the [facility] Crisis Line: [phone] call 911 if experiencing a Mental Health or Behavioral Health Crisis   Follow Up Plan: Telephone follow up appointment with care management team member scheduled for:  04/09/23 at 215 pm       CCM (PERIPHERAL ARTERY DISEASE) EXPECTED OUTCOME: MONITOR, SELF-MANAGE AND REDUCE SYMPTOMS OF PERIPHERAL ARTERY DISEASE       Current Barriers:  Knowledge Deficits related to Peripheral Artery Disease Care Coordination needs related to social work in a patient with Peripheral Artery Disease Chronic Disease Management support and education needs related to Peripheral Artery Disease Cognitive Deficits No Advanced Directives in place-documents previously mailed Spoke with daughter [name] who reports pt resides with her after hospitalization, she is primary caregiver, cooks meals for him, provides transportation.  Patient has cognitive impairment, forgetfulness, varies day to day, repeats same questions.  Per daughter, pt does not wear compression hose but is supposed to.  Planned Interventions: Evaluation of current treatment plan related to Peripheral Artery Disease and patient's adherence to plan as established by provider Advised patient to wear compression hose per doctor's order Provided education to patient and/or caregiver about advanced directives Reviewed medications with patient and discussed importance of taking as prescribed Assessed social determinant of health barriers Reinforced with daughter to give pt reminders and assist to apply compression hose, taking medications, offering fluids and food  Symptom Management: Take medications as prescribed   Attend all scheduled provider appointments Call pharmacy for medication refills 3-7 days in advance of running out of  medications Attend church or other social activities Call provider office for new concerns or questions  Work with the social worker to address care coordination needs and will continue to work with the clinical team to address health care and disease management related needs Encourage pt to wear compression daily and assist with application if needed Complete advanced directives  Follow Up Plan: Telephone follow up appointment with care management team member scheduled for:  04/09/23 at 215 pm          Plan:Telephone follow up appointment with care management team member scheduled for:  04/09/23 at 215 pm  [facility], BSN RN Case Manager [facility] [phone]

## 2023-03-08 NOTE — Patient Instructions (Signed)
Please call the care guide team at [phone] if you need to cancel or reschedule your appointment.   If you are experiencing a Mental Health or Behavioral Health Crisis or need someone to talk to, please call the Suicide and Crisis Lifeline: 988 call the [place] National Suicide Prevention Lifeline: 7073053714 or TTY: 929-671-3771 TTY (608)835-7064) to talk to a trained counselor call 1-800-273-TALK (toll free, 24 hour hotline) go to [facility] [address] [place] [phone]) call the [facility]: [phone] call 911   Following is a copy of the CCM Program Consent:  CCM service includes personalized support from designated clinical staff supervised by the physician, including individualized plan of care and coordination with other care providers 24/7 contact phone numbers for assistance for urgent and routine care needs. Service will only be billed when office clinical staff spend 20 minutes or more in a month to coordinate care. Only one practitioner may furnish and bill the service in a calendar month. The patient may stop CCM services at amy time (effective at the end of the month) by phone call to the office staff. The patient will be responsible for cost sharing (co-pay) or up to 20% of the service fee (after annual deductible is met)  Following is a copy of your full provider care plan:   Goals Addressed             This Visit's Progress    CCM (COGNITIVE IMPAIRMENT) EXPECTED OUTCOME: MONITOR, SELF-MANAGE AND REDUCE SYMPTOMS OF COGNITIVE IMPAIRMENT       Current Barriers:  Knowledge Deficits related to Cognitive Impairment Chronic Disease Management support and education needs related to Cognitive Impairment Cognitive Deficits- LCSW is working with patient/ daughter Patient now resides with daughter [name], hospitalized 6/11-6/13/24 for disorientation, then discharged to short term SNF, has been working  with Child psychotherapist for [employer] application, pt did not attend 02/09/23 appointment with primary care provider, daughter states she will call and reschedule this appointment, pt does not want to take some of his medications, does not wear compression hose  Planned Interventions: Reviewed medications including importance of taking as prescribed, Discussed importance of discussing diagnosis with provider, Discussed importance of attendance to all provider appointments, and Advised to contact provider for new or worsening symptoms Message sent to LCSW Danford Bad with request for update on PCS services, per LCSW, daughter decided no longer interested in [facility] Encouraged daughter [name] to call and make appointment with primary care provider  Symptom Management: Take medications as prescribed   Attend all scheduled provider appointments Call pharmacy for medication refills 3-7 days in advance of running out of medications Attend church or other social activities Call provider office for new concerns or questions  call the Suicide and Crisis Lifeline: 988 call the [place] National Suicide Prevention Lifeline: (406) 286-4589 or TTY: (709) 544-7096 TTY 670-363-1642) to talk to a trained counselor call 1-800-273-TALK (toll free, 24 hour hotline) go to [facility] [address] [place] [phone]) call the [facility] Crisis Line: [phone] call 911 if experiencing a Mental Health or Behavioral Health Crisis  Call and make appointment with your primary care provider   Follow Up Plan: Telephone follow up appointment with care management team member scheduled for:   04/09/23 at 215 pm       CCM (IRON DEFICIENCY ANEMIA) EXPECTED OUTCOME: MONITOR, SELF-MANAGE AND REDUCE SYMPTOMS OF IRON DEFICIENCY ANEMIA       Current Barriers:  Knowledge Deficits  related to Iron Deficiency Anemia management Chronic Disease Management support and education needs related to  Iron Deficiency Anemia Cognitive Deficits No Advanced Directives in place  Planned Interventions: Evaluation of current treatment plan related to Iron Deficiency Anemia and patient's adherence to plan as established by provider Provided education to patient re: Iron Deficiency Anemia Advised patient to discuss change in health status/ symptoms with provider Assessed social determinant of health barriers Reviewed all upcoming scheduled appointments Reinforced importance of regular laboratory monitoring  Symptom Management: Take medications as prescribed   Attend all scheduled provider appointments Call pharmacy for medication refills 3-7 days in advance of running out of medications Attend church or other social activities Call provider office for new concerns or questions  call the Suicide and Crisis Lifeline: 988 call the [place] National Suicide Prevention Lifeline: (628)718-4938 or TTY: 305-518-7279 TTY (418)884-9362) to talk to a trained counselor call 1-800-273-TALK (toll free, 24 hour hotline) go to [facility] [address], [place] [phone]) call the [facility] Crisis Line: [phone] call 911 if experiencing a Mental Health or Behavioral Health Crisis   Follow Up Plan: Telephone follow up appointment with care management team member scheduled for:  04/09/23 at 215 pm       CCM (PERIPHERAL ARTERY DISEASE) EXPECTED OUTCOME: MONITOR, SELF-MANAGE AND REDUCE SYMPTOMS OF PERIPHERAL ARTERY DISEASE       Current Barriers:  Knowledge Deficits related to Peripheral Artery Disease Care Coordination needs related to social work in a patient with Peripheral Artery Disease Chronic Disease Management support and education needs related to Peripheral Artery Disease Cognitive Deficits No Advanced Directives in place-documents previously mailed Spoke with daughter [name] who reports pt resides with her after hospitalization, she is  primary caregiver, cooks meals for him, provides transportation.  Patient has cognitive impairment, forgetfulness, varies day to day, repeats same questions.  Per daughter, pt does not wear compression hose but is supposed to.  Planned Interventions: Evaluation of current treatment plan related to Peripheral Artery Disease and patient's adherence to plan as established by provider Advised patient to wear compression hose per doctor's order Provided education to patient and/or caregiver about advanced directives Reviewed medications with patient and discussed importance of taking as prescribed Assessed social determinant of health barriers Reinforced with daughter to give pt reminders and assist to apply compression hose, taking medications, offering fluids and food  Symptom Management: Take medications as prescribed   Attend all scheduled provider appointments Call pharmacy for medication refills 3-7 days in advance of running out of medications Attend church or other social activities Call provider office for new concerns or questions  Work with the social worker to address care coordination needs and will continue to work with the clinical team to address health care and disease management related needs Encourage pt to wear compression daily and assist with application if needed Complete advanced directives  Follow Up Plan: Telephone follow up appointment with care management team member scheduled for:  04/09/23 at 215 pm          Patient verbalizes understanding of instructions and care plan provided today and agrees to view in MyChart. Active MyChart status and patient understanding of how to access instructions and care plan via MyChart confirmed with patient.  Telephone follow up appointment with care management team member scheduled for:  04/09/23 at 215 pm

## 2023-03-10 DIAGNOSIS — D508 Other iron deficiency anemias: Secondary | ICD-10-CM

## 2023-03-23 ENCOUNTER — Other Ambulatory Visit: Payer: Self-pay | Admitting: Cardiology

## 2023-03-30 ENCOUNTER — Ambulatory Visit: Payer: Self-pay | Admitting: *Deleted

## 2023-03-30 ENCOUNTER — Encounter: Payer: Self-pay | Admitting: *Deleted

## 2023-03-30 NOTE — Patient Outreach (Signed)
Care Coordination   Follow Up Visit Note   03/30/2023  Name: Erik [name] MRN: [id number] DOB: 11/01/1945  Erik [name] is a 77 y.o. year old male who sees Erik [name], Erik Mellow, MD for primary care. I spoke with Erik [name] by phone today.  What matters to the patients health and wellness today?  Receive Assistance Applying for Personal Care Services.   Goals Addressed             This Visit's Progress    Receive Assistance Applying for Personal Care Services.   On track    Care Coordination Interventions:  Interventions Today    Flowsheet Row Most Recent Value  Chronic Disease   Chronic disease during today's visit Other  [Inability to Perform Activities of Daily Living Independently, Prediabetes, Cognitive Impairment & Pharyngoesophageal Phase Dysphagia]  General Interventions   General Interventions Discussed/Reviewed General Interventions Discussed, Labs, Vaccines, Doctor Visits, Health Screening, General Interventions Reviewed, Annual Eye Exam, Durable Medical Equipment (DME), Community Resources, Level of Care, Communication with  [Encouraged]  Labs Hgb A1c annually  [Encouraged]  Vaccines COVID-19, Flu, Pneumonia, RSV, Shingles, Tetanus/Pertussis/Diphtheria  [Encouraged]  Doctor Visits Discussed/Reviewed Doctor Visits Discussed, Specialist, Doctor Visits Reviewed, Annual Wellness Visits, PCP  [Encouraged]  Health Screening Bone Density, Colonoscopy, Prostate  [Encouraged]  PCP/Specialist Visits Compliance with follow-up visit  [Encouraged]  Communication with PCP/Specialists, RN  Level of Care Adult Daycare, Personal Care Services, Applications, Assisted Living, Skilled Nursing Facility  [Encouraged]  Applications Personal Care Services  [Encouraged]  Exercise Interventions   Exercise Discussed/Reviewed Exercise Discussed, Assistive device use and maintanence, Exercise Reviewed, Physical Activity  [Encouraged]  Physical Activity Discussed/Reviewed  Physical Activity Discussed, Home Exercise Program (HEP), Physical Activity Reviewed, Types of exercise  [Encouraged]  Education Interventions   Education Provided Provided Therapist, sports, Provided Web-based Education, Provided Education  Provided Verbal Education On Development worker, community, [employer], When to see the doctor, Mental Health/Coping with Illness, Nutrition, Eye Care, Labs, Applications, Exercise, Medication  [Encouraged]  Applications Personal Care Services  [Encouraged]  Mental Health Interventions   Mental Health Discussed/Reviewed Mental Health Discussed, Anxiety, Depression, Grief and Loss, Substance Abuse, Suicide, Other, Crisis, Coping Strategies, Mental Health Reviewed  [Domestic Violence]  Nutrition Interventions   Nutrition Discussed/Reviewed Nutrition Discussed, Adding fruits and vegetables, Increaing proteins, Decreasing fats, Decreasing salt, Nutrition Reviewed, Carbohydrate meal planning, Decreasing sugar intake, Supplmental nutrition, Portion sizes, Fluid intake  [Encouraged]  Pharmacy Interventions   Pharmacy Dicussed/Reviewed Pharmacy Topics Discussed, Pharmacy Topics Reviewed, Medication Adherence, Affording Medications  [Encouraged]  Safety Interventions   Safety Discussed/Reviewed Safety Discussed, Safety Reviewed, Fall Risk, Home Safety  [Encouraged]  Home Safety Assistive Devices, Need for home safety assessment, Refer for community resources  [Encouraged]  Advanced Directive Interventions   Advanced Directives Discussed/Reviewed Advanced Directives Discussed  [Encouraged]      Active Listening & Reflection Utilized.  Verbalization of Feelings Encouraged.  Emotional Support Provided. Problem Solving Interventions Activated. Task-Centered Solutions Implemented.   Solution-Focused Strategies Employed. Acceptance & Commitment Therapy Performed. Cognitive Behavioral Therapy Initiated. Client-Centered Therapy Indicated. Encouraged Administration of  Medications, Exactly as Prescribed. Encouraged Attendance at Follow-Up Appointment with Primary Care Provider, Dr. Christel Mormon with [facility] [phone]), Scheduled on 04/01/2023 at 3:40 PM. Encouraged Self-Enrollment in [employer] Activities of Interest in [place], from List Provided, in An Effort to [employer] Socialization, Exercise, & Age Appropriate Activities in The Community. Encouraged Self-Enrollment in Support Groups of Interest in [facility], from List Provided, in  An Effort to Reduce Symptoms of Loneliness & Isolation.  Encouraged Utilization of Transportation Benefit through [employer], Providing Contact Information & Explaining Referral Process. Encouraged Consideration of Applying for Personal Care Services, through [facility] [phone]), in An Effort to Obtain Care & Supervision in The Home. Encouraged Contact with Daughter, Erik [name] to Confirm Continued Disinterest in Applying for Personal Care Services, through [facility] [phone]).  ~ HIPAA Compliant Messages Left on Voicemail, as CSW Awaits a Return Call. Encouraged Continued Involvement with Home Health Physical & Occupational Therapists, through [facility] [phone]), In An Effort to Increase Strength, Mobility, Safety, Etc.  Encouraged Contact with CSW (# [phone]), if You Have Questions, Need Assistance, or If Additional Social Work Needs Are Identified Between Now & Our Next Scheduled Follow-Up Outreach Call.      SDOH assessments and interventions completed:  Yes.  Care Coordination Interventions:  Yes, provided.   Follow up plan: Follow up call scheduled for 04/16/2023 at 3:00 pm.  Encounter Outcome:  Pt. Visit Completed.   Erik [name], BSW, MSW, LCSW  Licensed Restaurant manager, [employer]  Mailing [place] [address] [place], [place]  [address] Physical Address-[address] [place], [place] [address] Toll Free Main # [phone] Fax # [phone] Cell # [phone] [email or web address]

## 2023-03-30 NOTE — Patient Instructions (Signed)
Visit Information  Thank you for taking time to visit with me today. Please don't hesitate to contact me if I can be of assistance to you.   Following are the goals we discussed today:   Goals Addressed             This Visit's Progress    Receive Assistance Applying for Personal Care Services.   On track    Care Coordination Interventions:  Interventions Today    Flowsheet Row Most Recent Value  Chronic Disease   Chronic disease during today's visit Other  [Inability to Perform Activities of Daily Living Independently, Prediabetes, Cognitive Impairment & Pharyngoesophageal Phase Dysphagia]  General Interventions   General Interventions Discussed/Reviewed General Interventions Discussed, Labs, Vaccines, Doctor Visits, Health Screening, General Interventions Reviewed, Annual Eye Exam, Durable Medical Equipment (DME), Community Resources, Level of Care, Communication with  [Encouraged]  Labs Hgb A1c annually  [Encouraged]  Vaccines COVID-19, Flu, Pneumonia, RSV, Shingles, Tetanus/Pertussis/Diphtheria  [Encouraged]  Doctor Visits Discussed/Reviewed Doctor Visits Discussed, Specialist, Doctor Visits Reviewed, Annual Wellness Visits, PCP  [Encouraged]  Health Screening Bone Density, Colonoscopy, Prostate  [Encouraged]  PCP/Specialist Visits Compliance with follow-up visit  [Encouraged]  Communication with PCP/Specialists, RN  Level of Care Adult Daycare, Personal Care Services, Applications, Assisted Living, Skilled Nursing Facility  [Encouraged]  Applications Personal Care Services  [Encouraged]  Exercise Interventions   Exercise Discussed/Reviewed Exercise Discussed, Assistive device use and maintanence, Exercise Reviewed, Physical Activity  [Encouraged]  Physical Activity Discussed/Reviewed Physical Activity Discussed, Home Exercise Program (HEP), Physical Activity Reviewed, Types of exercise  [Encouraged]  Education Interventions   Education Provided Provided Therapist, sports,  Provided Web-based Education, Provided Education  Provided Verbal Education On Development worker, community, [employer], When to see the doctor, Mental Health/Coping with Illness, Nutrition, Eye Care, Labs, Applications, Exercise, Medication  [Encouraged]  Applications Personal Care Services  [Encouraged]  Mental Health Interventions   Mental Health Discussed/Reviewed Mental Health Discussed, Anxiety, Depression, Grief and Loss, Substance Abuse, Suicide, Other, Crisis, Coping Strategies, Mental Health Reviewed  [Domestic Violence]  Nutrition Interventions   Nutrition Discussed/Reviewed Nutrition Discussed, Adding fruits and vegetables, Increaing proteins, Decreasing fats, Decreasing salt, Nutrition Reviewed, Carbohydrate meal planning, Decreasing sugar intake, Supplmental nutrition, Portion sizes, Fluid intake  [Encouraged]  Pharmacy Interventions   Pharmacy Dicussed/Reviewed Pharmacy Topics Discussed, Pharmacy Topics Reviewed, Medication Adherence, Affording Medications  [Encouraged]  Safety Interventions   Safety Discussed/Reviewed Safety Discussed, Safety Reviewed, Fall Risk, Home Safety  [Encouraged]  Home Safety Assistive Devices, Need for home safety assessment, Refer for community resources  [Encouraged]  Advanced Directive Interventions   Advanced Directives Discussed/Reviewed Advanced Directives Discussed  [Encouraged]      Active Listening & Reflection Utilized.  Verbalization of Feelings Encouraged.  Emotional Support Provided. Problem Solving Interventions Activated. Task-Centered Solutions Implemented.   Solution-Focused Strategies Employed. Acceptance & Commitment Therapy Performed. Cognitive Behavioral Therapy Initiated. Client-Centered Therapy Indicated. Encouraged Administration of Medications, Exactly as Prescribed. Encouraged Attendance at Follow-Up Appointment with Primary Care Provider, Dr. Christel Mormon with [facility] [phone]), Scheduled  on 04/01/2023 at 3:40 PM. Encouraged Self-Enrollment in [employer] Activities of Interest in [place], from List Provided, in An Effort to [employer] Socialization, Exercise, & Age Appropriate Activities in The Community. Encouraged Self-Enrollment in Support Groups of Interest in [facility], from List Provided, in An Effort to Reduce Symptoms of Loneliness & Isolation.  Encouraged Utilization of Transportation Benefit through [employer], Providing Contact Information & Explaining Referral Process. Encouraged Consideration of Applying for Personal Care  Services, through [employer] [phone]), in An Effort to Obtain Care & Supervision in The Home. Encouraged Contact with Daughter, [name] to Confirm Continued Disinterest in Applying for Personal Care Services, through [facility] [phone]).  ~ HIPAA Compliant Messages Left on Voicemail, as CSW Awaits a Return Call. Encouraged Continued Involvement with Home Health Physical & Occupational Therapists, through [facility] [phone]), In An Effort to Increase Strength, Mobility, Safety, Etc.  Encouraged Contact with CSW (# [phone]), if You Have Questions, Need Assistance, or If Additional Social Work Needs Are Identified Between Now & Our Next Scheduled Follow-Up Outreach Call.      Our next appointment is by telephone on 04/16/2023 at 3:00 pm.  Please call the care guide team at [phone] if you need to cancel or reschedule your appointment.   If you are experiencing a Mental Health or Behavioral Health Crisis or need someone to talk to, please call the Suicide and Crisis Lifeline: 988 call the [place] National Suicide Prevention Lifeline: 580-215-1462 or TTY: 681-727-8860 TTY 330-804-8406) to talk to a trained counselor call 1-800-273-TALK (toll free, 24 hour hotline) go to [facility] [address] [place] [phone]) call the  [facility] Crisis Line: [phone] call 911  Patient verbalizes understanding of instructions and care plan provided today and agrees to view in MyChart. Active MyChart status and patient understanding of how to access instructions and care plan via MyChart confirmed with patient.     Telephone follow up appointment with care management team member scheduled for:  04/16/2023 at 3:00 pm.  Danford Bad, BSW, MSW, LCSW  Licensed Clinical Social Worker  [facility]  Mailing [place]. [address] [place], [place] [address] Physical Address-[address] [place], [place] [address] Toll Free Main # [phone] Fax # [phone] Cell # [phone] [email or web address]

## 2023-04-01 ENCOUNTER — Ambulatory Visit (INDEPENDENT_AMBULATORY_CARE_PROVIDER_SITE_OTHER): Payer: Medicare Other | Admitting: Internal Medicine

## 2023-04-01 ENCOUNTER — Encounter: Payer: Self-pay | Admitting: Internal Medicine

## 2023-04-01 VITALS — BP 86/54 | HR 88 | Ht 72.0 in | Wt 175.8 lb

## 2023-04-01 DIAGNOSIS — N138 Other obstructive and reflux uropathy: Secondary | ICD-10-CM | POA: Diagnosis not present

## 2023-04-01 DIAGNOSIS — I4891 Unspecified atrial fibrillation: Secondary | ICD-10-CM | POA: Diagnosis not present

## 2023-04-01 DIAGNOSIS — N401 Enlarged prostate with lower urinary tract symptoms: Secondary | ICD-10-CM

## 2023-04-01 DIAGNOSIS — I951 Orthostatic hypotension: Secondary | ICD-10-CM | POA: Diagnosis not present

## 2023-04-01 DIAGNOSIS — E785 Hyperlipidemia, unspecified: Secondary | ICD-10-CM

## 2023-04-01 DIAGNOSIS — G7080 Lambert-Eaton syndrome, unspecified: Secondary | ICD-10-CM

## 2023-04-01 NOTE — Patient Instructions (Addendum)
It was a pleasure to see you today.  Thank you for giving [place] the opportunity to be involved in your care.  Below is a brief recap of your visit and next steps.  We will plan to see you again in 3 months.  Summary Medications reviewed. Be sure to take Eliquis twice daily and midodrine 10 mg three times daily Follow up in 3 months     Schedule your Medicare Annual Wellness Visit at checkout.

## 2023-04-01 NOTE — Assessment & Plan Note (Signed)
BP is low at 86/54 currently.  He is prescribed midodrine 10 mg 3 times daily, but more recently has been taking 5 mg 3 times daily. -Medications reviewed.  He will start midodrine 10 mg 3 times daily.

## 2023-04-01 NOTE — Assessment & Plan Note (Signed)
He has not been taking Flomax as previously prescribed.  Denies LUTS currently.

## 2023-04-01 NOTE — Assessment & Plan Note (Signed)
Lipid panel updated in March.  Total cholesterol 179 and LDL 114.  Atorvastatin 40 mg daily was prescribed in light of this result but he has not been taking this medication. -Indications for statin therapy were reviewed with the patient.  He will start taking atorvastatin 40 mg daily as previously prescribed.

## 2023-04-01 NOTE — Progress Notes (Signed)
Established Patient Office Visit  Subjective   Patient ID: Erik [name], male    DOB: May 20, 1946  Age: 77 y.o. MRN: [id number]  Chief Complaint  Patient presents with   Follow-up   Erik [name] returns to care today for follow-up.  He was last evaluated at [facility] on 3/19 by Dr. Barbaraann Faster as a new patient presenting to establish care.  No medication changes were made at that time, baseline labs were ordered, and 77-month follow-up was arranged.  In the interim, he was admitted to [facility] 6/12 - 6/13 in the setting of AMS with new onset A-fib with RVR.  He was briefly discharged to SNF and then discharged home.  There have otherwise been no acute interval events.  Erik [name] reports feeling well today.  He is asymptomatic and has no acute concerns to discuss.  He is accompanied by his daughter and grandson today.  They would like to review his medications as there are certain ones that he refuses to take.  They would like to review the indications for each medication he is prescribed.  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 Jennell Corner external and internal hemorrhoids, redundant left colon   ESOPHAGEAL DILATION N/A 11/12/2014   Procedure: ESOPHAGEAL DILATION;  Surgeon: West Bali, MD;  Location: [facility];  Service: Endoscopy;  Laterality: N/A;   ESOPHAGOGASTRODUODENOSCOPY N/A 11/12/2014   Dr. Fields:mild non-erosive gastritis. negative H.pylori    None     Social History   Tobacco Use   Smoking status: Former    Current packs/day: 0.00    Average packs/day: 0.5 packs/day for 21.0 years (10.5 ttl pk-yrs)    Types: Cigarettes    Start date: 01/22/1963    Quit  date: 08/11/1983    Years since quitting: 39.6    Passive exposure: Past   Smokeless tobacco: Never   Tobacco comments:    Verified by Daughter, Erik [name]  Vaping Use   Vaping status: Never Used  Substance Use Topics   Alcohol use: No    Alcohol/week: 0.0 standard drinks of alcohol    Comment: remote past, history of ETOH abuse.    Drug use: No   Family History  Problem Relation Age of Onset   Dementia Mother    Crohn's disease Mother    Cancer Father    Sickle cell anemia Daughter    Stomach cancer Maternal Grandfather    Colon cancer Neg Hx    No Known Allergies  Review of Systems  Constitutional:  Negative for chills and fever.  HENT:  Negative for sore throat.   Respiratory:  Negative for cough and shortness of breath.   Cardiovascular:  Negative for chest pain, palpitations and leg swelling.  Gastrointestinal:  Negative for abdominal pain, blood in stool, constipation, diarrhea, nausea and vomiting.  Genitourinary:  Negative for dysuria and hematuria.  Musculoskeletal:  Negative for myalgias.  Skin:  Negative for itching and rash.  Neurological:  Negative for dizziness and headaches.  Psychiatric/Behavioral:  Negative for depression and suicidal ideas.       Objective:     BP (!) 86/54   Pulse 88  Ht 6' (1.829 m)   Wt 175 lb 12.8 oz (79.7 kg)   SpO2 96%   BMI 23.84 kg/m  BP Readings from Last 3 Encounters:  04/01/23 (!) 86/54  02/04/23 128/76  01/26/23 (!) 121/58   Physical Exam Vitals reviewed.  Constitutional:      General: He is not in acute distress.    Appearance: Normal appearance. He is not ill-appearing.  HENT:     Head: Normocephalic and atraumatic.     Right Ear: External ear normal.     Left Ear: External ear normal.     Nose: Nose normal. No congestion or rhinorrhea.     Mouth/Throat:     Mouth: Mucous membranes are moist.     Pharynx: Oropharynx is clear.  Eyes:     General: No scleral icterus.    Extraocular Movements:  Extraocular movements intact.     Conjunctiva/sclera: Conjunctivae normal.     Pupils: Pupils are equal, round, and reactive to light.  Cardiovascular:     Rate and Rhythm: Normal rate and regular rhythm.     Pulses: Normal pulses.     Heart sounds: Normal heart sounds. No murmur heard. Pulmonary:     Effort: Pulmonary effort is normal.     Breath sounds: Normal breath sounds. No wheezing, rhonchi or rales.  Abdominal:     General: Abdomen is flat. Bowel sounds are normal. There is no distension.     Palpations: Abdomen is soft.     Tenderness: There is no abdominal tenderness.  Musculoskeletal:        General: No swelling or deformity. Normal range of motion.     Cervical back: Normal range of motion.  Skin:    General: Skin is warm and dry.     Capillary Refill: Capillary refill takes less than 2 seconds.  Neurological:     General: No focal deficit present.     Mental Status: He is alert and oriented to person, place, and time.     Motor: No weakness.  Psychiatric:        Mood and Affect: Mood normal.        Behavior: Behavior normal.        Thought Content: Thought content normal.     Last CBC Lab Results  Component Value Date   WBC 8.9 01/25/2023   HGB 11.3 (L) 01/25/2023   HCT 34.4 (L) 01/25/2023   MCV 69.4 (L) 01/25/2023   MCH 22.8 (L) 01/25/2023   RDW 14.7 01/25/2023   PLT 315 01/25/2023   Last metabolic panel Lab Results  Component Value Date   GLUCOSE 93 01/25/2023   NA 135 01/25/2023   K 3.7 01/25/2023   CL 100 01/25/2023   CO2 25 01/25/2023   BUN 8 01/25/2023   CREATININE 1.17 01/25/2023   GFRNONAA >60 01/25/2023   CALCIUM 8.4 (L) 01/25/2023   PROT 6.7 01/25/2023   ALBUMIN 2.9 (L) 01/25/2023   LABGLOB 2.9 11/06/2022   AGRATIO 1.5 11/06/2022   BILITOT 1.0 01/25/2023   ALKPHOS 57 01/25/2023   AST 33 01/25/2023   ALT 33 01/25/2023   ANIONGAP 10 01/25/2023   Last lipids Lab Results  Component Value Date   CHOL 179 11/06/2022   HDL 34 (L)  11/06/2022   LDLCALC 114 (H) 11/06/2022   TRIG 173 (H) 11/06/2022   CHOLHDL 5.3 (H) 11/06/2022   Last hemoglobin A1c Lab Results  Component Value Date   HGBA1C 6.2 (H) 11/06/2022   Last thyroid  functions Lab Results  Component Value Date   TSH 0.489 01/20/2023   Last vitamin D Lab Results  Component Value Date   VD25OH 79.5 11/06/2022   Last vitamin B12 and Folate Lab Results  Component Value Date   VITAMINB12 >2000 (H) 11/06/2022   FOLATE 36.3 12/03/2016     Assessment & Plan:   Problem List Items Addressed This Visit       Orthostatic hypotension    BP is low at 86/54 currently.  He is prescribed midodrine 10 mg 3 times daily, but more recently has been taking 5 mg 3 times daily. -Medications reviewed.  He will start midodrine 10 mg 3 times daily.      Atrial fibrillation with RVR (HCC) - Primary    Regular rate and rhythm detected on exam today.  He is currently prescribed Cardizem and Eliquis but has not been taking either medication. -Medications reviewed with the patient today.  He was strongly encouraged to start Eliquis 5 mg twice daily as instructed upon hospital discharge.  We reviewed that this is indicated to prevent cardioembolic events.  He expresses understanding and will start taking Eliquis.  Heart rate today is 88.  He is mildly hypotensive.  For now, he will hold Cardizem.      LEMS (Lambert-Eaton myasthenic syndrome) (HCC)    Followed by neurology at [facility].  He is prescribed amifampridine 15 mg 4 times daily, which he is compliant with.  Reports that he is due for follow-up with his neurologist in November.      BPH with obstruction/lower urinary tract symptoms    He has not been taking Flomax as previously prescribed.  Denies LUTS currently.      Hyperlipidemia LDL goal <100    Lipid panel updated in March.  Total cholesterol 179 and LDL 114.  Atorvastatin 40 mg daily was prescribed in light of this result but he has not been taking this  medication. -Indications for statin therapy were reviewed with the patient.  He will start taking atorvastatin 40 mg daily as previously prescribed.       Return in about 3 months (around 07/02/2023).    [name], MD

## 2023-04-01 NOTE — Assessment & Plan Note (Signed)
Regular rate and rhythm detected on exam today.  He is currently prescribed Cardizem and Eliquis but has not been taking either medication. -Medications reviewed with the patient today.  He was strongly encouraged to start Eliquis 5 mg twice daily as instructed upon hospital discharge.  We reviewed that this is indicated to prevent cardioembolic events.  He expresses understanding and will start taking Eliquis.  Heart rate today is 88.  He is mildly hypotensive.  For now, he will hold Cardizem.

## 2023-04-01 NOTE — Assessment & Plan Note (Signed)
Followed by neurology at [facility].  He is prescribed amifampridine 15 mg 4 times daily, which he is compliant with.  Reports that he is due for follow-up with his neurologist in November.

## 2023-04-09 ENCOUNTER — Other Ambulatory Visit: Payer: Medicare Other | Admitting: *Deleted

## 2023-04-09 ENCOUNTER — Encounter: Payer: Self-pay | Admitting: *Deleted

## 2023-04-09 NOTE — Patient Instructions (Signed)
Visit Information  Thank you for taking time to visit with me today. Please don't hesitate to contact me if I can be of assistance to you before our next scheduled telephone appointment.  Following are the goals we discussed today:   Goals Addressed             This Visit's Progress    CCM (COGNITIVE IMPAIRMENT) EXPECTED OUTCOME: MONITOR, SELF-MANAGE AND REDUCE SYMPTOMS OF COGNITIVE IMPAIRMENT       Current Barriers:  Knowledge Deficits related to Cognitive Impairment Chronic Disease Management support and education needs related to Cognitive Impairment Cognitive Deficits- LCSW is working with patient/ daughter Patient now resides with daughter [name], hospitalized 6/11-6/13/24 for disorientation, then discharged to short term SNF, has been working with Child psychotherapist for [employer] application, pt did not attend 02/09/23 appointment with primary care provider, daughter states she will call and reschedule this appointment, pt does not want to take some of his medications, does not wear compression hose Patient recently saw primary care provider and noted pt does not always take medications correctly, RN Care Manager spoke with pt (was unable to reach daughter and pt states she is not home), pt reports " I'm doing good, thank you", no new concerns reported by pt  Planned Interventions: Reviewed medications including importance of taking as prescribed, Discussed importance of discussing diagnosis with provider, Discussed importance of attendance to all provider appointments, and Advised to contact provider for new or worsening symptoms  Symptom Management: Take medications as prescribed   Attend all scheduled provider appointments Call pharmacy for medication refills 3-7 days in advance of running out of medications Attend church or other social activities Call provider office for new concerns or questions  call the Suicide and Crisis Lifeline: 988 call the [place] National Suicide Prevention  Lifeline: 4247619312 or TTY: (858)334-9281 TTY 607-512-6996) to talk to a trained counselor call 1-800-273-TALK (toll free, 24 hour hotline) go to [facility] [address] [place] [phone]) call the [facility] Crisis Line: [phone] call 911 if experiencing a Mental Health or Behavioral Health Crisis  It is important to take all medications as prescribed  Follow Up Plan: Telephone follow up appointment with care management team member scheduled for:    06/10/23 at 3 pm       CCM (IRON DEFICIENCY ANEMIA) EXPECTED OUTCOME: MONITOR, SELF-MANAGE AND REDUCE SYMPTOMS OF IRON DEFICIENCY ANEMIA       Current Barriers:  Knowledge Deficits related to Iron Deficiency Anemia management Chronic Disease Management support and education needs related to Iron Deficiency Anemia Cognitive Deficits No Advanced Directives in place  Planned Interventions: Evaluation of current treatment plan related to Iron Deficiency Anemia and patient's adherence to plan as established by provider Provided education to patient re: Iron Deficiency Anemia Advised patient to discuss change in health status/ symptoms with provider Assessed social determinant of health barriers Reviewed all upcoming scheduled appointments Reviewed importance of regular laboratory monitoring  Symptom Management: Take medications as prescribed   Attend all scheduled provider appointments Call pharmacy for medication refills 3-7 days in advance of running out of medications Attend church or other social activities Call provider office for new concerns or questions  call the Suicide and Crisis Lifeline: 988 call the [place] National Suicide Prevention Lifeline: 703-015-4504 or TTY: 908 111 7375 TTY 302-211-6421) to talk to a trained counselor call 1-800-273-TALK (toll free, 24 hour hotline) go to [facility] [address] [place]  [phone]) call the  [facility]: [phone] call 911 if experiencing a Mental Health or Behavioral Health Crisis   Follow Up Plan: Telephone follow up appointment with care management team member scheduled for:  06/10/23 at 3 pm       CCM (PERIPHERAL ARTERY DISEASE) EXPECTED OUTCOME: MONITOR, SELF-MANAGE AND REDUCE SYMPTOMS OF PERIPHERAL ARTERY DISEASE       Current Barriers:  Knowledge Deficits related to Peripheral Artery Disease Care Coordination needs related to social work in a patient with Peripheral Artery Disease Chronic Disease Management support and education needs related to Peripheral Artery Disease Cognitive Deficits No Advanced Directives in place-documents previously mailed Spoke with daughter [name] who reports pt resides with her after hospitalization, she is primary caregiver, cooks meals for him, provides transportation.  Patient has cognitive impairment, forgetfulness, varies day to day, repeats same questions.  Per daughter, pt does not wear compression hose but is supposed to. Update 04/09/23- patient's daughter was not available to speak with, pt reports he continues to reside at his daughter's home, no new concerns reported  Planned Interventions: Evaluation of current treatment plan related to Peripheral Artery Disease and patient's adherence to plan as established by provider Advised patient to wear compression hose per doctor's order Reviewed medications with patient and discussed importance of taking as prescribed Reinforced with patient to wear compression hose, take medications as prescribed, drink adequate fluids   Symptom Management: Take medications as prescribed   Attend all scheduled provider appointments Call pharmacy for medication refills 3-7 days in advance of running out of medications Attend church or other social activities Call provider office for new concerns or questions  Work with the social worker to address  care coordination needs and will continue to work with the clinical team to address health care and disease management related needs Reinforced with pt to wear compression daily and assist with application if needed Complete advanced directives  Follow Up Plan: Telephone follow up appointment with care management team member scheduled for:  06/10/23 at 3 pm           Our next appointment is by telephone on 06/10/23 at 3 pm  Please call the care guide team at [phone] if you need to cancel or reschedule your appointment.   If you are experiencing a Mental Health or Behavioral Health Crisis or need someone to talk to, please call the Suicide and Crisis Lifeline: 988 call the [place] National Suicide Prevention Lifeline: (346)670-3792 or TTY: (872)476-5504 TTY (404) 253-7509) to talk to a trained counselor call 1-800-273-TALK (toll free, 24 hour hotline) go to [facility] [address], [place] [phone]) call the [facility] Crisis Line: ([phone] call 911   Patient verbalizes understanding of instructions and care plan provided today and agrees to view in MyChart. Active MyChart status and patient understanding of how to access instructions and care plan via MyChart confirmed with patient.     Telephone follow up appointment with care management team member scheduled for: 06/10/23 at 3 pm  Irving Shows [facility], BSN [place]/ Ambulatory Care Management [phone]

## 2023-04-09 NOTE — Patient Outreach (Signed)
Care Management   Visit Note  04/09/2023 Name: Erik [name] MRN: [id number] DOB: September 19, 1945  Subjective: Erik [name] is a 77 y.o. year old male who is a primary care patient of [name], MD. The Care Management team was consulted for assistance.      Engaged with patient spoke with patient by telephone for follow up   Goals Addressed             This Visit's Progress    CCM (COGNITIVE IMPAIRMENT) EXPECTED OUTCOME: MONITOR, SELF-MANAGE AND REDUCE SYMPTOMS OF COGNITIVE IMPAIRMENT       Current Barriers:  Knowledge Deficits related to Cognitive Impairment Chronic Disease Management support and education needs related to Cognitive Impairment Cognitive Deficits- LCSW is working with patient/ daughter Patient now resides with daughter Erik [name], hospitalized 6/11-6/13/24 for disorientation, then discharged to short term SNF, has been working with Child psychotherapist for [employer] application, pt did not attend 02/09/23 appointment with primary care provider, daughter states she will call and reschedule this appointment, pt does not want to take some of his medications, does not wear compression hose Patient recently saw primary care provider and noted pt does not always take medications correctly, RN Care Manager spoke with pt (was unable to reach daughter and pt states she is not home), pt reports " I'm doing good, thank you", no new concerns reported by pt  Planned Interventions: Reviewed medications including importance of taking as prescribed, Discussed importance of discussing diagnosis with provider, Discussed importance of attendance to all provider appointments, and Advised to contact provider for new or worsening symptoms  Symptom Management: Take medications as prescribed   Attend all scheduled provider appointments Call pharmacy for medication refills 3-7 days in advance of running out of medications Attend church or other social activities Call provider office for  new concerns or questions  call the Suicide and Crisis Lifeline: 988 call the [place] National Suicide Prevention Lifeline: 737-607-1118 or TTY: 443-827-3088 TTY 201-377-0734) to talk to a trained counselor call 1-800-273-TALK (toll free, 24 hour hotline) go to [facility] [address] [place] [phone]) call the [facility] Crisis Line: [phone] call 911 if experiencing a Mental Health or Behavioral Health Crisis  It is important to take all medications as prescribed  Follow Up Plan: Telephone follow up appointment with care management team member scheduled for:    06/10/23 at 3 pm       CCM (IRON DEFICIENCY ANEMIA) EXPECTED OUTCOME: MONITOR, SELF-MANAGE AND REDUCE SYMPTOMS OF IRON DEFICIENCY ANEMIA       Current Barriers:  Knowledge Deficits related to Iron Deficiency Anemia management Chronic Disease Management support and education needs related to Iron Deficiency Anemia Cognitive Deficits No Advanced Directives in place  Planned Interventions: Evaluation of current treatment plan related to Iron Deficiency Anemia and patient's adherence to plan as established by provider Provided education to patient re: Iron Deficiency Anemia Advised patient to discuss change in health status/ symptoms with provider Assessed social determinant of health barriers Reviewed all upcoming scheduled appointments Reviewed importance of regular laboratory monitoring  Symptom Management: Take medications as prescribed   Attend all scheduled provider appointments Call pharmacy for medication refills 3-7 days in advance of running out of medications Attend church or other social activities Call provider office for new concerns or questions  call the Suicide and Crisis Lifeline: 988 call the [place] National Suicide Prevention Lifeline: (601)841-3168 or TTY: 385-800-0908 TTY (662) 652-6998) to talk to a trained counselor call  1-800-273-TALK (toll free,  24 hour hotline) go to [facility] [address], [place] [phone]) call the [facility] Crisis Line: [phone] call 911 if experiencing a Mental Health or Behavioral Health Crisis   Follow Up Plan: Telephone follow up appointment with care management team member scheduled for:  06/10/23 at 3 pm       CCM (PERIPHERAL ARTERY DISEASE) EXPECTED OUTCOME: MONITOR, SELF-MANAGE AND REDUCE SYMPTOMS OF PERIPHERAL ARTERY DISEASE       Current Barriers:  Knowledge Deficits related to Peripheral Artery Disease Care Coordination needs related to social work in a patient with Peripheral Artery Disease Chronic Disease Management support and education needs related to Peripheral Artery Disease Cognitive Deficits No Advanced Directives in place-documents previously mailed Spoke with daughter Erik [name] who reports pt resides with her after hospitalization, she is primary caregiver, cooks meals for him, provides transportation.  Patient has cognitive impairment, forgetfulness, varies day to day, repeats same questions.  Per daughter, pt does not wear compression hose but is supposed to. Update 04/09/23- patient's daughter was not available to speak with, pt reports he continues to reside at his daughter's home, no new concerns reported  Planned Interventions: Evaluation of current treatment plan related to Peripheral Artery Disease and patient's adherence to plan as established by provider Advised patient to wear compression hose per doctor's order Reviewed medications with patient and discussed importance of taking as prescribed Reinforced with patient to wear compression hose, take medications as prescribed, drink adequate fluids   Symptom Management: Take medications as prescribed   Attend all scheduled provider appointments Call pharmacy for medication refills 3-7 days in advance of running out of medications Attend church or other social  activities Call provider office for new concerns or questions  Work with the social worker to address care coordination needs and will continue to work with the clinical team to address health care and disease management related needs Reinforced with pt to wear compression daily and assist with application if needed Complete advanced directives  Follow Up Plan: Telephone follow up appointment with care management team member scheduled for:  06/10/23 at 3 pm           Plan: Telephone follow up appointment with care management team member scheduled for: 06/10/23 at 3 pm  Irving Shows [facility], BSN / Ambulatory Care Management [phone]

## 2023-04-16 ENCOUNTER — Encounter: Payer: Self-pay | Admitting: *Deleted

## 2023-04-16 ENCOUNTER — Ambulatory Visit: Payer: Self-pay | Admitting: *Deleted
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
## 2023-04-28 ENCOUNTER — Ambulatory Visit: Payer: Medicare Other | Attending: Cardiology | Admitting: Cardiology

## 2023-04-28 ENCOUNTER — Ambulatory Visit: Payer: Medicare Other | Attending: Cardiology

## 2023-04-28 ENCOUNTER — Telehealth: Payer: Self-pay | Admitting: Cardiology

## 2023-04-28 ENCOUNTER — Encounter: Payer: Self-pay | Admitting: Cardiology

## 2023-04-28 ENCOUNTER — Other Ambulatory Visit: Payer: Self-pay | Admitting: Cardiology

## 2023-04-28 VITALS — BP 140/80 | HR 80 | Ht 72.0 in | Wt 176.4 lb

## 2023-04-28 DIAGNOSIS — I4891 Unspecified atrial fibrillation: Secondary | ICD-10-CM

## 2023-04-28 DIAGNOSIS — I951 Orthostatic hypotension: Secondary | ICD-10-CM | POA: Diagnosis not present

## 2023-04-28 DIAGNOSIS — I480 Paroxysmal atrial fibrillation: Secondary | ICD-10-CM | POA: Insufficient documentation

## 2023-04-28 NOTE — Telephone Encounter (Signed)
Checking percert on the following patient for   2 week Zio XT Dx: A-fib

## 2023-04-28 NOTE — Progress Notes (Signed)
Clinical Summary Erik [name] is a 77 y.o.male seen today for follow up of the following medical problems.    1. Orthostatic hypotension -florinef stopped due to  issues with weight gain and edema. - has done well with just midodrine along with aggressive hydration and sodium intake     - recent admission with afib with RVR. Was discharged on diltiazem. Following discharge severe symptoms of orthostatic dizziness, he stopped taking the diltiazem - compliant with midodrine, taking 10mg  tid.    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 LE edema - controlled, has not needed prn lasix   4. PAD - followed by vacscular  5. Afib - new diagnosis during 01/2023 admission. Found down at home, altered by conscious. Rhabdo during admission.  - converted back to SR on IV cardizem - was discharged on diltaizem, he stopped taking due to severe worsenign of his orthostatic symptoms.   Past Medical History:  Diagnosis Date   B12 deficiency 06/25/2015   DDD (degenerative disc disease), lumbar    Iron deficiency anemia 06/04/2016   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   Low blood pressure    Seizures (HCC)    in remote past      No Known Allergies   Current Outpatient Medications  Medication Sig Dispense Refill   acetaminophen (TYLENOL) 500 MG tablet Take 500 mg by mouth every 6 (six) hours as needed.     Amifampridine Phosphate 10 MG TABS Take 15 mg by mouth 4 (four) times daily. (Patient not taking: Reported on 04/01/2023)     apixaban (ELIQUIS) 5 MG TABS tablet Take 1 tablet (5 mg total) by mouth 2 (two) times daily. (Patient not taking: Reported on 04/01/2023) 60 tablet 5   atorvastatin (LIPITOR) 40 MG tablet Take 1 tablet (40 mg total) by mouth daily. (Patient not taking: Reported on 04/01/2023) 90 tablet 3   Cholecalciferol (D3 5000) 125 MCG (5000 UT) capsule Take 5,000 Units by mouth daily. (Patient not taking: Reported on  04/01/2023)     diltiazem (CARDIZEM CD) 240 MG 24 hr capsule Take 1 capsule (240 mg total) by mouth daily. (Patient not taking: Reported on 04/01/2023) 90 capsule 3   furosemide (LASIX) 40 MG tablet TAKE 1 TABLET BY MOUTH ONCE DAILY ON MONDAY, WEDNESDAY AND FRIDAY. MAY TAKE ADDITIONAL AS NEEDED FOR SWELLING. (Patient not taking: Reported on 04/01/2023) 60 tablet 2   midodrine (PROAMATINE) 10 MG tablet Take 1 tablet (10 mg total) by mouth 3 (three) times daily with meals. (Patient not taking: Reported on 04/01/2023) 270 tablet 3   tamsulosin (FLOMAX) 0.4 MG CAPS capsule Take 1 capsule (0.4 mg total) by mouth daily after supper. (Patient not taking: Reported on 04/01/2023) 30 capsule 3   trolamine salicylate (ASPERCREME) 10 % cream Apply 1 application topically as needed for muscle pain. (Patient not taking: Reported on 04/01/2023)     No current facility-administered medications for this visit.     Past Surgical History:  Procedure Laterality Date   COLONOSCOPY N/A 11/12/2014   Dr. Jennell Corner external and internal hemorrhoids, redundant left colon   ESOPHAGEAL DILATION N/A 11/12/2014   Procedure: ESOPHAGEAL DILATION;  Surgeon: West Bali, MD;  Location: [facility];  Service: Endoscopy;  Laterality: N/A;   ESOPHAGOGASTRODUODENOSCOPY N/A 11/12/2014   Dr. Fields:mild non-erosive gastritis. negative H.pylori    None       No Known Allergies    Family History  Problem Relation  Age of Onset   Dementia Mother    Crohn's disease Mother    Cancer Father    Sickle cell anemia Daughter    Stomach cancer Maternal Grandfather    Colon cancer Neg Hx      Social History Erik [name] reports that he quit smoking about 39 years ago. His smoking use included cigarettes. He started smoking about 60 years ago. He has a 10.5 pack-year smoking history. He has been exposed to tobacco smoke. He has never used smokeless tobacco. Erik [name] reports no history of alcohol use.   Review of  Systems CONSTITUTIONAL: No weight loss, fever, chills, weakness or fatigue.  HEENT: Eyes: No visual loss, blurred vision, double vision or yellow sclerae.No hearing loss, sneezing, congestion, runny nose or sore throat.  SKIN: No rash or itching.  CARDIOVASCULAR: per hpi RESPIRATORY: No shortness of breath, cough or sputum.  GASTROINTESTINAL: No anorexia, nausea, vomiting or diarrhea. No abdominal pain or blood.  GENITOURINARY: No burning on urination, no polyuria NEUROLOGICAL: No headache, dizziness, syncope, paralysis, ataxia, numbness or tingling in the extremities. No change in bowel or bladder control.  MUSCULOSKELETAL: No muscle, back pain, joint pain or stiffness.  LYMPHATICS: No enlarged nodes. No history of splenectomy.  PSYCHIATRIC: No history of depression or anxiety.  ENDOCRINOLOGIC: No reports of sweating, cold or heat intolerance. No polyuria or polydipsia.  Marland Kitchen   Physical Examination Today's Vitals   04/28/23 1352 04/28/23 1358  BP: (!) 140/80 (!) 140/80  Pulse: 80   SpO2: 98%   Weight: 176 lb 6.4 oz (80 kg)   Height: 6' (1.829 m)    Body mass index is 23.92 kg/m.  Gen: resting comfortably, no acute distress HEENT: no scleral icterus, pupils equal round and reactive, no palptable cervical adenopathy,  CV: RRR, no mrg, no jvd Resp: Clear to auscultation bilaterally GI: abdomen is soft, non-tender, non-distended, normal bowel sounds, no hepatosplenomegaly MSK: extremities are warm, no edema.  Skin: warm, no rash Neuro:  no focal deficits Psych: appropriate affect   Diagnostic Studies  06/2014 echo Study Conclusions  - Left ventricle: The cavity size was normal. Wall thickness was   normal. Systolic function was normal. The estimated ejection   fraction was in the range of 55% to 60%. Wall motion was normal;   there were no regional wall motion abnormalities. Doppler   parameters are consistent with abnormal left ventricular   relaxation (grade 1 diastolic  dysfunction). - Aortic valve: Trileaflet; mildly thickened leaflets. - Mitral valve: Mildly thickened leaflets . There was trivial   regurgitation. - Right atrium: Central venous pressure (est): 3 mm Hg. - Atrial septum: No defect or patent foramen ovale was identified. - Tricuspid valve: There was mild regurgitation. - Pulmonary arteries: PA peak pressure: 34 mm Hg (S). - Pericardium, extracardiac: There was no pericardial effusion.  Impressions:  - Normal LV wall thickness with LVEF 55-60%, grade 1 diastolic   dysfunction. Mildly thickened aortic valve. Trivial mitral and   mild regurgitation. PASP 34 mmHg.     Jan 2018 [facility] [place] No aneurysm      01/2023 echo 1. Left ventricular ejection fraction, by estimation, is 50 to 55%. The  left ventricle has low normal function. The left ventricle has no regional  wall motion abnormalities. Left ventricular diastolic parameters were  normal.   2. Right ventricular systolic function is normal. The right ventricular  size is normal. There is moderately elevated pulmonary artery systolic  pressure.   3. The mitral  valve is normal in structure. No evidence of mitral valve  regurgitation. No evidence of mitral stenosis.   4. The tricuspid valve is abnormal.   5. The aortic valve is tricuspid. Aortic valve regurgitation is not  visualized. No aortic stenosis is present.   6. The inferior vena cava is dilated in size with <50% respiratory  variability, suggesting right atrial pressure of 15 mmHg.     Assessment and Plan  1. Orthostatiic hypotension -- florinef stopped due to significant edema and weight gain - doing well on midodrine and aggressive hydration, continue current therapy     2. PAF - new diagnosis during recent admission - he stopped diltiazem as it made his orthostatic hypotension/dizziness worst. Currently not on any av nodal agents. He denies any symptoms - EKG today SR, PACs - obtain 2 week zio patch. If episodes of  uncontrolled afib would need to think about an antiarrhythmic.     F/u 6 months  Antoine Poche, M.D.

## 2023-04-28 NOTE — Patient Instructions (Addendum)
Medication Instructions:  Your physician has recommended you make the following change in your medication:  Stop taking Aspirin  Continue taking all other medications as prescribed  Labwork: None  Testing/Procedures: Your physician has recommended that you wear a Zio monitor.   This monitor is a medical device that records the heart's electrical activity. Doctors most often use these monitors to diagnose arrhythmias. Arrhythmias are problems with the speed or rhythm of the heartbeat. The monitor is a small device applied to your chest. You can wear one while you do your normal daily activities. While wearing this monitor if you have any symptoms to push the button and record what you felt. Once you have worn this monitor for the period of time provider prescribed (for 14 days), you will return the monitor device in the postage paid box. Once it is returned they will download the data collected and provide [place] with a report which the provider will then review and we will call you with those results. Important tips:  Avoid showering during the first 48 hours of wearing the monitor. Avoid excessive sweating to help maximize wear time. Do not submerge the device, no hot tubs, and no swimming pools. Keep any lotions or oils away from the patch. After 48 hours you may shower with the patch on. Take brief showers with your back facing the shower head.  Do not remove patch once it has been placed because that will interrupt data and decrease adhesive wear time. Push the button when you have any symptoms and write down what you were feeling. Once you have completed wearing your monitor, remove and place into box which has postage paid and place in your outgoing mailbox.  If for some reason you have misplaced your box then call our office and we can provide another box and/or mail it off for you.   Follow-Up: Your physician recommends that you schedule a follow-up appointment in: 6 months  Any Other  Special Instructions Will Be Listed Below (If Applicable).  If you need a refill on your cardiac medications before your next appointment, please call your pharmacy.

## 2023-05-05 DIAGNOSIS — Z23 Encounter for immunization: Secondary | ICD-10-CM | POA: Diagnosis not present

## 2023-05-06 ENCOUNTER — Ambulatory Visit: Payer: Self-pay | Admitting: *Deleted
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
## 2023-05-18 DIAGNOSIS — I4891 Unspecified atrial fibrillation: Secondary | ICD-10-CM | POA: Diagnosis not present

## 2023-06-08 ENCOUNTER — Ambulatory Visit: Payer: Self-pay | Admitting: *Deleted

## 2023-06-08 NOTE — Patient Instructions (Signed)
Visit Information  Thank you for taking time to visit with me today. Please don't hesitate to contact me if I can be of assistance to you.   Following are the goals we discussed today:   Goals Addressed               This Visit's Progress     Receive Assistance Applying for Personal Care Services. (pt-stated)   On track     Care Coordination Interventions:  Interventions Today    Flowsheet Row Most Recent Value  Chronic Disease   Chronic disease during today's visit Atrial Fibrillation (AFib), Other  [Lambert-Eaton myasthenic syndrome, Vitamin D Deficiency, Severe Protein-Calorie Malnutrition,Loss of Weight, Iron Deficiency Anemia, B12 deficiency, Prediabetes & Cognitive Impairment]  General Interventions   General Interventions Discussed/Reviewed General Interventions Discussed, Labs, Vaccines, Doctor Visits, Annual Foot Exam, Health Screening, General Interventions Reviewed, Annual Eye Exam, Durable Medical Equipment (DME), Community Resources, Level of Care, Communication with  [Communication with Care Team Members]  Labs Hgb A1c every 3 months, Kidney Function  Vaccines COVID-19, Flu, Pneumonia, RSV, Shingles, Tetanus/Pertussis/Diphtheria  [Encouraged]  Doctor Visits Discussed/Reviewed Doctor Visits Discussed, Specialist, Doctor Visits Reviewed, Annual Wellness Visits, PCP  Health Screening Bone Density, Colonoscopy, Prostate  [Encouraged]  Durable Medical Equipment (DME) Walker, BP Cuff  PCP/Specialist Visits Compliance with follow-up visit  Communication with PCP/Specialists, RN  Level of Care Adult Daycare, Personal Care Services, Applications, Assisted Living  Applications Medicaid, Personal Care Services, [place]  Exercise Interventions   Exercise Discussed/Reviewed Exercise Discussed, Assistive device use and maintanence, Exercise Reviewed, Physical Activity, Weight Managment  Physical Activity Discussed/Reviewed Home Exercise Program (HEP), PREP, Gym, Types of exercise,  Physical Activity Reviewed, Physical Activity Discussed  Weight Management Weight maintenance  Education Interventions   Education Provided Provided Education  Provided Verbal Education On Nutrition, Eye Care, Mental Health/Coping with Illness, When to see the doctor, Applications, Exercise, Medication, Insurance Plans, [employer], Foot Care  Applications Medicaid, Personal Care Services, [place]  Mental Health Interventions   Mental Health Discussed/Reviewed Anxiety, Mental Health Discussed, Depression, Grief and Loss, Mental Health Reviewed, Substance Abuse, Coping Strategies, Crisis, Suicide  Nutrition Interventions   Nutrition Discussed/Reviewed Nutrition Discussed, Adding fruits and vegetables, Nutrition Reviewed, Fluid intake, Carbohydrate meal planning, Portion sizes, Decreasing sugar intake, Supplemental nutrition, Decreasing salt, Increasing proteins, Decreasing fats  Pharmacy Interventions   Pharmacy Dicussed/Reviewed Pharmacy Topics Discussed, Medications and their functions, Medication Adherence, Pharmacy Topics Reviewed, Affording Medications  Safety Interventions   Safety Discussed/Reviewed Safety Discussed, Safety Reviewed, Fall Risk  Home Safety Assistive Devices, Refer for community resources  Advanced Directive Interventions   Advanced Directives Discussed/Reviewed Advanced Directives Discussed      Active Listening & Reflection Utilized.  Verbalization of Feelings Encouraged.  Emotional Support Provided. Problem Solving Interventions Employed. Solution-Focused Strategies Implemented Acceptance & Commitment Therapy Performed. Cognitive Behavioral Therapy Initiated. Encouraged Consideration of Allowing CSW to Place Referral for Prepared Meal Delivery Services, through [employer], with Aging, Disability & Transit Services of [place] [phone]). Encouraged Initiation of Advanced Directives (Living Will & Healthcare Power of Attorney Documents) &  Submission to Dr. Christel Mormon, Primary Care Provider with [facility] Primary [facility] [phone]), to Scan Into Electronic Medical Record in Epic, Offering to Provide Packet & Assist with Completion. Encouraged Self-Enrollment in [employer] of Interest in [place], from List Provided, in An Effort to [employer], Exercise & Age Appropriate Activities in [employer]. Encouraged Engagement with Irving Shows, [facility] Health/Ambulatory Care Management Nurse [phone]# [phone]),  During Follow-Up Outreach Call, Scheduled on 06/10/2023 at 3:00 PM. Encouraged Contact with CSW (# [phone]), if You Have Questions, Need Assistance, or If Additional Social Work Needs Are Identified Between Now & Our Next Follow-Up Outreach Call, Scheduled on 06/29/2023 at 9:30 AM. Encouraged Attendance at Follow-Up Appointment with Dr. Christel Mormon, Primary Care Provider with [facility] Primary Care [phone]), Scheduled on 07/02/2023 at 4:20 PM.      Our next appointment is by telephone on 06/29/2023 at 9:30 am.  Please call the care guide team at [phone] if you need to cancel or reschedule your appointment.   If you are experiencing a Mental Health or Behavioral Health Crisis or need someone to talk to, please call the Suicide and Crisis Lifeline: 988 call the [place] National Suicide Prevention Lifeline: 7722330267 or TTY: (661)846-0972 TTY (970)789-4980) to talk to a trained counselor call 1-800-273-TALK (toll free, 24 hour hotline) go to [facility] Urgent Care [address], [place] [phone]) call the [facility] Crisis Line: 6706856641 call 911  Patient verbalizes understanding of instructions and care plan provided today and agrees to view in MyChart. Active MyChart status and patient understanding of how to access instructions and care plan via MyChart confirmed with patient.     Telephone follow up  appointment with care management team member scheduled for:  06/29/2023 at 9:30 am.  Danford Bad, BSW, MSW, LCSW  Embedded Practice Social Work Case Manager  [facility], Population Health Direct Dial: [phone]  Fax: [phone] Email: [email or web address]

## 2023-06-08 NOTE — Patient Outreach (Signed)
Care Coordination   Follow Up Visit Note   06/08/2023  Name: Erik [name] MRN: [id number] DOB: 1946/01/20  Erik [name] is a 77 y.o. year old male who sees [name], [name], MD for primary care. I spoke with Erik [name] by phone today.  What matters to the patients health and wellness today?  Receive Assistance Applying for Personal Care Services.   Goals Addressed               This Visit's Progress     Receive Assistance Applying for Personal Care Services. (pt-stated)   On track     Care Coordination Interventions:  Interventions Today    Flowsheet Row Most Recent Value  Chronic Disease   Chronic disease during today's visit Atrial Fibrillation (AFib), Other  [Lambert-Eaton myasthenic syndrome, Vitamin D Deficiency, Severe Protein-Calorie Malnutrition,Loss of Weight, Iron Deficiency Anemia, B12 deficiency, Prediabetes & Cognitive Impairment]  General Interventions   General Interventions Discussed/Reviewed General Interventions Discussed, Labs, Vaccines, Doctor Visits, Annual Foot Exam, Health Screening, General Interventions Reviewed, Annual Eye Exam, Durable Medical Equipment (DME), Community Resources, Level of Care, Communication with  [Communication with Care Team Members]  Labs Hgb A1c every 3 months, Kidney Function  Vaccines COVID-19, Flu, Pneumonia, RSV, Shingles, Tetanus/Pertussis/Diphtheria  [Encouraged]  Doctor Visits Discussed/Reviewed Doctor Visits Discussed, Specialist, Doctor Visits Reviewed, Annual Wellness Visits, PCP  Health Screening Bone Density, Colonoscopy, Prostate  [Encouraged]  Durable Medical Equipment (DME) Walker, BP Cuff  PCP/Specialist Visits Compliance with follow-up visit  Communication with PCP/Specialists, RN  Level of Care Adult Daycare, Personal Care Services, Applications, Assisted Living  Applications Medicaid, Personal Care Services, [place]-2  Exercise Interventions   Exercise Discussed/Reviewed Exercise Discussed,  Assistive device use and maintanence, Exercise Reviewed, Physical Activity, Weight Managment  Physical Activity Discussed/Reviewed Home Exercise Program (HEP), PREP, Gym, Types of exercise, Physical Activity Reviewed, Physical Activity Discussed  Weight Management Weight maintenance  Education Interventions   Education Provided Provided Education  Provided Verbal Education On Nutrition, Eye Care, Mental Health/Coping with Illness, When to see the doctor, Applications, Exercise, Medication, Insurance Plans, [employer], Foot Care  Applications Medicaid, Personal Care Services, [place]  Mental Health Interventions   Mental Health Discussed/Reviewed Anxiety, Mental Health Discussed, Depression, Grief and Loss, Mental Health Reviewed, Substance Abuse, Coping Strategies, Crisis, Suicide  Nutrition Interventions   Nutrition Discussed/Reviewed Nutrition Discussed, Adding fruits and vegetables, Nutrition Reviewed, Fluid intake, Carbohydrate meal planning, Portion sizes, Decreasing sugar intake, Supplemental nutrition, Decreasing salt, Increasing proteins, Decreasing fats  Pharmacy Interventions   Pharmacy Dicussed/Reviewed Pharmacy Topics Discussed, Medications and their functions, Medication Adherence, Pharmacy Topics Reviewed, Affording Medications  Safety Interventions   Safety Discussed/Reviewed Safety Discussed, Safety Reviewed, Fall Risk  Home Safety Assistive Devices, Refer for community resources  Advanced Directive Interventions   Advanced Directives Discussed/Reviewed Advanced Directives Discussed      Active Listening & Reflection Utilized.  Verbalization of Feelings Encouraged.  Emotional Support Provided. Problem Solving Interventions Employed. Solution-Focused Strategies Implemented Acceptance & Commitment Therapy Performed. Cognitive Behavioral Therapy Initiated. Encouraged Consideration of Allowing CSW to Place Referral for Prepared Meal Delivery Services, through  [employer], with Aging, Disability & Transit Services of [place] [phone]). Encouraged Initiation of Advanced Directives (Living Will & Healthcare Power of Attorney Documents) & Submission to Dr. Christel Mormon, Primary Care Provider with [facility] Primary Care [phone]), to Scan Into Electronic Medical Record in Epic, Offering to Provide Packet & Assist with Completion. Encouraged Self-Enrollment in [employer] of Interest in  [facility], from List Provided, in An Effort to [employer] Socialization, Exercise & Age Appropriate Activities in The Community. Encouraged Engagement with Irving Shows, [facility] Health/Ambulatory Care Management Nurse [phone]# [phone]), During Follow-Up Outreach Call, Scheduled on 06/10/2023 at 3:00 PM. Encouraged Contact with CSW (# [phone]), if You Have Questions, Need Assistance, or If Additional Social Work Needs Are Identified Between Now & Our Next Follow-Up Outreach Call, Scheduled on 06/29/2023 at 9:30 AM. Encouraged Attendance at Follow-Up Appointment with Dr. Christel Mormon, Primary Care Provider with [facility] Primary Care [phone]# [phone]), Scheduled on 07/02/2023 at 4:20 PM.      SDOH assessments and interventions completed:  Yes.  Care Coordination Interventions:  Yes, provided.   Follow up plan: Follow up call scheduled for 06/29/2023 at 9:30 am.  Encounter Outcome:  Patient Visit Completed.   Danford Bad, BSW, MSW, Printmaker Social Work Case Set designer Health  [facility], Population Health Direct Dial: [phone]  Fax: [phone] Email: [email or web address]: [email or web address]

## 2023-06-10 ENCOUNTER — Other Ambulatory Visit: Payer: Self-pay | Admitting: *Deleted

## 2023-06-23 ENCOUNTER — Telehealth: Payer: Self-pay | Admitting: *Deleted

## 2023-06-23 NOTE — Progress Notes (Signed)
  Care Coordination Note  06/23/2023 Name: Erik [name] MRN: [id number] DOB: Mar 17, 1946  Erik [name] is a 77 y.o. year old male who is a primary care patient of [name], MD and is actively engaged with the care management team. I reached out to Erik [name] by phone today to assist with re-scheduling a follow up visit with the RN Case Manager  Follow up plan: Unsuccessful telephone outreach attempt made. A HIPAA compliant phone message was left for the patient providing contact information and requesting a return call.   [facility]  Care Coordination Care Guide  Direct Dial: [phone]

## 2023-06-24 ENCOUNTER — Encounter: Payer: Self-pay | Admitting: *Deleted

## 2023-06-29 ENCOUNTER — Ambulatory Visit: Payer: Self-pay | Admitting: *Deleted

## 2023-06-29 ENCOUNTER — Encounter: Payer: Self-pay | Admitting: *Deleted

## 2023-06-29 NOTE — Patient Outreach (Signed)
Care Coordination   Follow Up Visit Note   06/29/2023  Name: Erik [name] MRN: [id number] DOB: 02/11/1946  Erik [name] is a 77 y.o. year old male who sees [name], [name], MD for primary care. I spoke with Erik [name] by phone today.  What matters to the patients health and wellness today?  Receive Assistance Applying for Personal Care Services.    Goals Addressed               This Visit's Progress     Receive Assistance Applying for Personal Care Services. (pt-stated)   On track     Care Coordination Interventions:  Interventions Today    Flowsheet Row Most Recent Value  Chronic Disease   Chronic disease during today's visit Atrial Fibrillation (AFib), Other  [Lambert-Eaton myasthenic syndrome, Vitamin D Deficiency, Severe Protein-Calorie Malnutrition, Loss of Weight, Iron Deficiency Anemia, B12 deficiency, Prediabetes, Neurocognitive & Cognitive Impairment]  General Interventions   General Interventions Discussed/Reviewed General Interventions Discussed, General Interventions Reviewed, Durable Medical Equipment (DME), Community Resources, Level of Care, Communication with, Doctor Visits  [Communication with Care Team Members]  Doctor Visits Discussed/Reviewed Doctor Visits Discussed, Specialist, Doctor Visits Reviewed, Annual Wellness Visits, PCP  [Encouraged Routine Engagement]  Horticulturist, commercial (DME) Dan Humphreys, BP Cuff, Other  [Cane, Prescription Eyeglasses, Scales]  PCP/Specialist Visits Compliance with follow-up visit  [Encouraged Routine Engagement]  Communication with PCP/Specialists, RN, Pharmacists, Social Work  [Encouraged Routine Engagement]  Level of Care Adult Daycare, Assisted Living, Personal Care Services  [Confirmed Disinterest in Applying for Adult Day Care Programs, Personal Care Services, or Pursuing Higher Level of Care Placement Options]  Education Interventions   Education Provided Provided Education  Provided Verbal  Education On Mental Health/Coping with Illness, When to see the doctor, [employer], Development worker, community  Mental Health Interventions   Mental Health Discussed/Reviewed Mental Health Discussed, Anxiety, Depression, Grief and Loss, Mental Health Reviewed, Substance Abuse, Coping Strategies, Crisis, Suicide  [Assessed Mental Health Status]  Safety Interventions   Safety Discussed/Reviewed Safety Discussed, Safety Reviewed, Fall Risk, Home Safety  [Encouraged Consideration of Home Safety Evaluation]  Home Safety Assistive Devices  [Encouraged Consistent Use of Assistive Devices]  Advanced Directive Interventions   Advanced Directives Discussed/Reviewed Advanced Directives Discussed  [Encouraged Initiation, Youth worker & Assistance with Completion]      Active Listening & Reflection Utilized.  Verbalization of Feelings Encouraged.  Emotional Support Provided. Problem Solving Interventions Indicated. Solution-Focused Strategies Employed. Cognitive Behavioral Therapy Initiated. Encouraged Routine Engagement with Representative from Aging, Disability & Transit Services of [place] [phone]), to Check Status of Name on Waiting List for Prepared Meal Delivery Services through [employer].  Encouraged Initiation of Advanced Directives (Living Will & Healthcare Power of Attorney Documents) & Submission to Dr. Christel Mormon, Primary Care Provider with [facility] Primary Care [phone]), to Scan Into Electronic Medical Record in Epic, Offering to Provide Packet & Assist with Completion. Encouraged Engagement with Danford Bad, Licensed Clinical Social Worker with [facility] [phone]), if You Have Questions, Need Assistance, or If Additional Social Work Needs Are Identified Between Now & Our Next Follow-Up Outreach Call, Scheduled on 07/27/2023 at 9:00 AM.        SDOH assessments and interventions completed:  Yes.  SDOH  Interventions Today    Flowsheet Row Most Recent Value  SDOH Interventions   Food Insecurity Interventions Intervention Not Indicated  Housing Interventions Intervention Not Indicated  Transportation Interventions Intervention Not Indicated, Patient Resources (Friends/Family), Payor  Benefit  Utilities Interventions Intervention Not Indicated  Alcohol Usage Interventions Intervention Not Indicated (Score <7)  Financial Strain Interventions Intervention Not Indicated  Physical Activity Interventions Patient Declined  Stress Interventions Intervention Not Indicated  Social Connections Interventions Intervention Not Indicated  Health Literacy Interventions Intervention Not Indicated     Care Coordination Interventions:  Yes, provided.   Follow up plan: Follow up call scheduled for 07/27/2023 at 9:00 am.  Encounter Outcome:  Patient Visit Completed.   Danford Bad, BSW, MSW, Printmaker Social Work Case Set designer Health  [facility], Population Health Direct Dial: [phone]  Fax: [phone] Email: [email or web address] Website: [email or web address]

## 2023-06-29 NOTE — Patient Instructions (Signed)
Visit Information  Thank you for taking time to visit with me today. Please don't hesitate to contact me if I can be of assistance to you.   Following are the goals we discussed today:   Goals Addressed               This Visit's Progress     Receive Assistance Applying for Personal Care Services. (pt-stated)   On track     Care Coordination Interventions:  Interventions Today    Flowsheet Row Most Recent Value  Chronic Disease   Chronic disease during today's visit Atrial Fibrillation (AFib), Other  [Lambert-Eaton myasthenic syndrome, Vitamin D Deficiency, Severe Protein-Calorie Malnutrition, Loss of Weight, Iron Deficiency Anemia, B12 deficiency, Prediabetes, Neurocognitive & Cognitive Impairment]  General Interventions   General Interventions Discussed/Reviewed General Interventions Discussed, General Interventions Reviewed, Durable Medical Equipment (DME), Community Resources, Level of Care, Communication with, Doctor Visits  [Communication with Care Team Members]  Doctor Visits Discussed/Reviewed Doctor Visits Discussed, Specialist, Doctor Visits Reviewed, Annual Wellness Visits, PCP  [Encouraged Routine Engagement]  Horticulturist, commercial (DME) Dan Humphreys, BP Cuff, Other  [Cane, Prescription Eyeglasses, Scales]  PCP/Specialist Visits Compliance with follow-up visit  [Encouraged Routine Engagement]  Communication with PCP/Specialists, RN, Pharmacists, Social Work  [Encouraged Routine Engagement]  Level of Care Adult Daycare, Assisted Living, Personal Care Services  [Confirmed Disinterest in Applying for Adult Day Care Programs, Personal Care Services, or Pursuing Higher Level of Care Placement Options]  Education Interventions   Education Provided Provided Education  Provided Verbal Education On Mental Health/Coping with Illness, When to see the doctor, [employer], Development worker, community  Mental Health Interventions   Mental Health Discussed/Reviewed Mental Health Discussed,  Anxiety, Depression, Grief and Loss, Mental Health Reviewed, Substance Abuse, Coping Strategies, Crisis, Suicide  [Assessed Mental Health Status]  Safety Interventions   Safety Discussed/Reviewed Safety Discussed, Safety Reviewed, Fall Risk, Home Safety  [Encouraged Consideration of Home Safety Evaluation]  Home Safety Assistive Devices  [Encouraged Consistent Use of Assistive Devices]  Advanced Directive Interventions   Advanced Directives Discussed/Reviewed Advanced Directives Discussed  [Encouraged Initiation, Youth worker & Assistance with Completion]      Active Listening & Reflection Utilized.  Verbalization of Feelings Encouraged.  Emotional Support Provided. Problem Solving Interventions Indicated. Solution-Focused Strategies Employed. Cognitive Behavioral Therapy Initiated. Encouraged Routine Engagement with Representative from Aging, Disability & Transit Services of [place] [phone]), to Check Status of Name on Waiting List for Prepared Meal Delivery Services through [employer].  Encouraged Initiation of Advanced Directives (Living Will & Healthcare Power of Attorney Documents) & Submission to Dr. Christel Mormon, Primary Care Provider with [facility] Primary Care [phone]), to Scan Into Electronic Medical Record in Epic, Offering to Provide Packet & Assist with Completion. Encouraged Engagement with Danford Bad, Licensed Clinical Social Worker with [facility] [phone]), if You Have Questions, Need Assistance, or If Additional Social Work Needs Are Identified Between Now & Our Next Follow-Up Outreach Call, Scheduled on 07/27/2023 at 9:00 AM.      Our next appointment is by telephone on 07/27/2023 at 9:00 am.  Please call the care guide team at [phone] if you need to cancel or reschedule your appointment.   If you are experiencing a Mental Health or Behavioral Health Crisis or need someone to talk to, please  call the Suicide and Crisis Lifeline: 988 call the [place] National Suicide Prevention Lifeline: (731)760-2964 or TTY: (218) 269-8473 TTY (940)730-1519) to talk to a trained counselor call 1-800-273-TALK (toll free, 24  hour hotline) go to [facility] Urgent [facility] [address] [place] [phone]) call the [facility] Crisis Line: [phone] call 911  Patient verbalizes understanding of instructions and care plan provided today and agrees to view in MyChart. Active MyChart status and patient understanding of how to access instructions and care plan via MyChart confirmed with patient.     Telephone follow up appointment with care management team member scheduled for:  07/27/2023 at 9:00 am.  Danford Bad, BSW, MSW, LCSW  Embedded Practice Social Work Case Manager  [facility], Population Health Direct Dial: [phone]  Fax: [phone] Email: [email or web address]: [email or web address]

## 2023-06-30 NOTE — Progress Notes (Signed)
  Care Coordination Note  06/30/2023 Name: Erik [name] MRN: [id number] DOB: October 07, 1945  Erik [name] is a 77 y.o. year old male who is a primary care patient of [name], MD and is actively engaged with the care management team. I reached out to Erik [name] by phone today to assist with re-scheduling a follow up visit with the RN Case Manager  Follow up plan: Telephone appointment with care management team member scheduled for:07/07/23  [facility] Coordination Care Guide  Direct Dial: [phone]

## 2023-07-02 ENCOUNTER — Encounter: Payer: Self-pay | Admitting: Internal Medicine

## 2023-07-02 ENCOUNTER — Ambulatory Visit (INDEPENDENT_AMBULATORY_CARE_PROVIDER_SITE_OTHER): Payer: Medicare Other | Admitting: Internal Medicine

## 2023-07-02 VITALS — BP 122/66 | HR 88 | Resp 16 | Ht 72.0 in | Wt 170.0 lb

## 2023-07-02 DIAGNOSIS — E538 Deficiency of other specified B group vitamins: Secondary | ICD-10-CM | POA: Diagnosis not present

## 2023-07-02 DIAGNOSIS — Z1159 Encounter for screening for other viral diseases: Secondary | ICD-10-CM | POA: Diagnosis not present

## 2023-07-02 DIAGNOSIS — R7303 Prediabetes: Secondary | ICD-10-CM | POA: Diagnosis not present

## 2023-07-02 DIAGNOSIS — E785 Hyperlipidemia, unspecified: Secondary | ICD-10-CM

## 2023-07-02 DIAGNOSIS — Z1329 Encounter for screening for other suspected endocrine disorder: Secondary | ICD-10-CM

## 2023-07-02 DIAGNOSIS — Z131 Encounter for screening for diabetes mellitus: Secondary | ICD-10-CM

## 2023-07-02 DIAGNOSIS — I4891 Unspecified atrial fibrillation: Secondary | ICD-10-CM | POA: Diagnosis not present

## 2023-07-02 DIAGNOSIS — E559 Vitamin D deficiency, unspecified: Secondary | ICD-10-CM | POA: Diagnosis not present

## 2023-07-02 DIAGNOSIS — D509 Iron deficiency anemia, unspecified: Secondary | ICD-10-CM

## 2023-07-02 DIAGNOSIS — G7080 Lambert-Eaton syndrome, unspecified: Secondary | ICD-10-CM | POA: Diagnosis not present

## 2023-07-02 DIAGNOSIS — I951 Orthostatic hypotension: Secondary | ICD-10-CM | POA: Diagnosis not present

## 2023-07-02 NOTE — Assessment & Plan Note (Addendum)
Regular rate and rhythm detected on exam today.  He remains on Eliquis 5 mg twice daily.  Cardizem previously discontinued in the setting of orthostasis.

## 2023-07-02 NOTE — Patient Instructions (Signed)
It was a pleasure to see you today.  Thank you for giving us the opportunity to be involved in your care.  Below is a brief recap of your visit and next steps.  We will plan to see you again in 3 months.  Summary No medication changes today Repeat labs ordered Follow up in 3 months  

## 2023-07-02 NOTE — Assessment & Plan Note (Signed)
Followed by neurology at [facility].  He remains on amifampridine 15 mg 4 times daily.  Neurology follow-up is scheduled for next month.

## 2023-07-02 NOTE — Assessment & Plan Note (Signed)
Noted on previous labs with A1c 6.2.  Repeat A1c ordered today.

## 2023-07-02 NOTE — Assessment & Plan Note (Signed)
Lipid panel last updated in March.  Total cholesterol 179 and LDL 114.  At his last appointment I recommended he resume taking atorvastatin 40 mg daily as previously prescribed. -Repeat lipid panel ordered today

## 2023-07-02 NOTE — Assessment & Plan Note (Signed)
BP is stable on midodrine 10 mg 3 times daily.  No changes are indicated today.

## 2023-07-02 NOTE — Progress Notes (Signed)
Established Patient Office Visit  Subjective   Patient ID: Erik [name], male    DOB: 07/09/1946  Age: 77 y.o. MRN: [id number]  Chief Complaint  Patient presents with   Follow-up   Erik [name] returns to care today for routine follow-up.  He was last evaluated by me on 8/22.  We reviewed all of his medications and their indications at that time.  22-month follow-up was arranged.  In the interim, he has been evaluated by cardiology for follow-up.  There have otherwise been no acute interval events.  Erik [name] reports feeling well today.  He is asymptomatic and has no acute concerns to discuss.  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 Jennell Corner external and internal hemorrhoids, redundant left colon   ESOPHAGEAL DILATION N/A 11/12/2014   Procedure: ESOPHAGEAL DILATION;  Surgeon: West Bali, MD;  Location: [facility];  Service: Endoscopy;  Laterality: N/A;   ESOPHAGOGASTRODUODENOSCOPY N/A 11/12/2014   Dr. Fields:mild non-erosive gastritis. negative H.pylori    None     Social History   Tobacco Use   Smoking status: Former    Current packs/day: 0.00    Average packs/day: 0.5 packs/day for 21.0 years (10.5 ttl pk-yrs)    Types: Cigarettes    Start date: 01/22/1963    Quit date: 08/11/1983    Years since quitting: 39.9    Passive exposure: Past   Smokeless tobacco: Never   Tobacco comments:    Verified by Daughter, [name]  Vaping Use   Vaping status: Never Used  Substance Use Topics   Alcohol use: No    Alcohol/week: 0.0 standard drinks of alcohol    Comment: remote past, history of ETOH abuse.    Drug use: No   Family History  Problem  Relation Age of Onset   Dementia Mother    Crohn's disease Mother    Cancer Father    Sickle cell anemia Daughter    Stomach cancer Maternal Grandfather    Colon cancer Neg Hx    No Known Allergies  Review of Systems  Constitutional:  Negative for chills and fever.  HENT:  Negative for sore throat.   Respiratory:  Negative for cough and shortness of breath.   Cardiovascular:  Negative for chest pain, palpitations and leg swelling.  Gastrointestinal:  Negative for abdominal pain, blood in stool, constipation, diarrhea, nausea and vomiting.  Genitourinary:  Negative for dysuria and hematuria.  Musculoskeletal:  Negative for myalgias.  Skin:  Negative for itching and rash.  Neurological:  Negative for dizziness and headaches.  Psychiatric/Behavioral:  Negative for depression and suicidal ideas.      Objective:     BP 122/66   Pulse 88   Resp 16   Ht 6' (1.829 m)   Wt 170 lb (77.1 kg)   SpO2 94%   BMI 23.06 kg/m  BP Readings from Last 3 Encounters:  07/02/23 122/66  04/28/23 (!) 140/80  04/01/23 (!) 86/54   Physical Exam Vitals reviewed.  Constitutional:      General: He is not in acute distress.    Appearance: Normal appearance. He is not ill-appearing.  HENT:  Head: Normocephalic and atraumatic.     Right Ear: External ear normal.     Left Ear: External ear normal.     Nose: Nose normal. No congestion or rhinorrhea.     Mouth/Throat:     Mouth: Mucous membranes are moist.     Pharynx: Oropharynx is clear.  Eyes:     General: No scleral icterus.    Extraocular Movements: Extraocular movements intact.     Conjunctiva/sclera: Conjunctivae normal.     Pupils: Pupils are equal, round, and reactive to light.  Cardiovascular:     Rate and Rhythm: Normal rate and regular rhythm.     Pulses: Normal pulses.     Heart sounds: Normal heart sounds. No murmur heard. Pulmonary:     Effort: Pulmonary effort is normal.     Breath sounds: Normal breath sounds. No  wheezing, rhonchi or rales.  Abdominal:     General: Abdomen is flat. Bowel sounds are normal. There is no distension.     Palpations: Abdomen is soft.     Tenderness: There is no abdominal tenderness.  Musculoskeletal:        General: No swelling or deformity. Normal range of motion.     Cervical back: Normal range of motion.  Skin:    General: Skin is warm and dry.     Capillary Refill: Capillary refill takes less than 2 seconds.  Neurological:     General: No focal deficit present.     Mental Status: He is alert and oriented to person, place, and time.     Motor: No weakness.     Gait: Gait abnormal (Ambulates with cane).  Psychiatric:        Mood and Affect: Mood normal.        Behavior: Behavior normal.        Thought Content: Thought content normal.   Last CBC Lab Results  Component Value Date   WBC 8.9 01/25/2023   HGB 11.3 (L) 01/25/2023   HCT 34.4 (L) 01/25/2023   MCV 69.4 (L) 01/25/2023   MCH 22.8 (L) 01/25/2023   RDW 14.7 01/25/2023   PLT 315 01/25/2023   Last metabolic panel Lab Results  Component Value Date   GLUCOSE 93 01/25/2023   NA 135 01/25/2023   K 3.7 01/25/2023   CL 100 01/25/2023   CO2 25 01/25/2023   BUN 8 01/25/2023   CREATININE 1.17 01/25/2023   GFRNONAA >60 01/25/2023   CALCIUM 8.4 (L) 01/25/2023   PROT 6.7 01/25/2023   ALBUMIN 2.9 (L) 01/25/2023   LABGLOB 2.9 11/06/2022   AGRATIO 1.5 11/06/2022   BILITOT 1.0 01/25/2023   ALKPHOS 57 01/25/2023   AST 33 01/25/2023   ALT 33 01/25/2023   ANIONGAP 10 01/25/2023   Last lipids Lab Results  Component Value Date   CHOL 179 11/06/2022   HDL 34 (L) 11/06/2022   LDLCALC 114 (H) 11/06/2022   TRIG 173 (H) 11/06/2022   CHOLHDL 5.3 (H) 11/06/2022   Last hemoglobin A1c Lab Results  Component Value Date   HGBA1C 6.2 (H) 11/06/2022   Last thyroid functions Lab Results  Component Value Date   TSH 0.489 01/20/2023   Last vitamin D Lab Results  Component Value Date   VD25OH 79.5  11/06/2022   Last vitamin B12 and Folate Lab Results  Component Value Date   VITAMINB12 >2000 (H) 11/06/2022   FOLATE 36.3 12/03/2016   The 10-year ASCVD risk score (Arnett DK, et al., 2019) is: 23.9%  Assessment & Plan:   Problem List Items Addressed This Visit       Orthostatic hypotension    BP is stable on midodrine 10 mg 3 times daily.  No changes are indicated today.      Atrial fibrillation with RVR (HCC)    Regular rate and rhythm detected on exam today.  He remains on Eliquis 5 mg twice daily.  Cardizem previously discontinued in the setting of orthostasis.      LEMS (Lambert-Eaton myasthenic syndrome) (HCC)    Followed by neurology at [facility].  He remains on amifampridine 15 mg 4 times daily.  Neurology follow-up is scheduled for next month.      Prediabetes    Noted on previous labs with A1c 6.2.  Repeat A1c ordered today.      Hyperlipidemia LDL goal <100    Lipid panel last updated in March.  Total cholesterol 179 and LDL 114.  At his last appointment I recommended he resume taking atorvastatin 40 mg daily as previously prescribed. -Repeat lipid panel ordered today      Return in about 3 months (around 10/02/2023).   [name], MD

## 2023-07-04 ENCOUNTER — Other Ambulatory Visit: Payer: Self-pay | Admitting: Internal Medicine

## 2023-07-04 DIAGNOSIS — R944 Abnormal results of kidney function studies: Secondary | ICD-10-CM

## 2023-07-04 LAB — CMP14+EGFR
ALT: 59 [IU]/L — ABNORMAL HIGH (ref 0–44)
AST: 37 [IU]/L (ref 0–40)
Albumin: 4.5 g/dL (ref 3.8–4.8)
Alkaline Phosphatase: 82 [IU]/L (ref 44–121)
BUN/Creatinine Ratio: 15 (ref 10–24)
BUN: 23 mg/dL (ref 8–27)
Bilirubin Total: 0.6 mg/dL (ref 0.0–1.2)
CO2: 23 mmol/L (ref 20–29)
Calcium: 10 mg/dL (ref 8.6–10.2)
Chloride: 102 mmol/L (ref 96–106)
Creatinine, Ser: 1.53 mg/dL — ABNORMAL HIGH (ref 0.76–1.27)
Globulin, Total: 2.9 g/dL (ref 1.5–4.5)
Glucose: 104 mg/dL — ABNORMAL HIGH (ref 70–99)
Potassium: 4.4 mmol/L (ref 3.5–5.2)
Sodium: 141 mmol/L (ref 134–144)
Total Protein: 7.4 g/dL (ref 6.0–8.5)
eGFR: 46 mL/min/{1.73_m2} — ABNORMAL LOW (ref >59)

## 2023-07-04 LAB — LIPID PANEL
Chol/HDL Ratio: 2.7 ratio (ref 0.0–5.0)
Cholesterol, Total: 105 mg/dL (ref 100–199)
HDL: 39 mg/dL — ABNORMAL LOW (ref >39)
LDL Chol Calc (NIH): 50 mg/dL (ref 0–99)
Triglycerides: 80 mg/dL (ref 0–149)
VLDL Cholesterol Cal: 16 mg/dL (ref 5–40)

## 2023-07-04 LAB — CBC WITH DIFFERENTIAL/PLATELET
Basophils Absolute: 0.1 10*3/uL (ref 0.0–0.2)
Basos: 1 %
EOS (ABSOLUTE): 0 10*3/uL (ref 0.0–0.4)
Eos: 0 %
Hematocrit: 37 % — ABNORMAL LOW (ref 37.5–51.0)
Hemoglobin: 11.4 g/dL — ABNORMAL LOW (ref 13.0–17.7)
Immature Grans (Abs): 0 10*3/uL (ref 0.0–0.1)
Immature Granulocytes: 0 %
Lymphocytes Absolute: 1.8 10*3/uL (ref 0.7–3.1)
Lymphs: 19 %
MCH: 22.3 pg — ABNORMAL LOW (ref 26.6–33.0)
MCHC: 30.8 g/dL — ABNORMAL LOW (ref 31.5–35.7)
MCV: 72 fL — ABNORMAL LOW (ref 79–97)
Monocytes Absolute: 0.7 10*3/uL (ref 0.1–0.9)
Monocytes: 7 %
Neutrophils Absolute: 7.1 10*3/uL — ABNORMAL HIGH (ref 1.4–7.0)
Neutrophils: 73 %
Platelets: 229 10*3/uL (ref 150–450)
RBC: 5.12 x10E6/uL (ref 4.14–5.80)
RDW: 15.7 % — ABNORMAL HIGH (ref 11.6–15.4)
WBC: 9.6 10*3/uL (ref 3.4–10.8)

## 2023-07-04 LAB — B12 AND FOLATE PANEL
Folate: 15.1 ng/mL (ref >3.0)
Vitamin B-12: 747 pg/mL (ref 232–1245)

## 2023-07-04 LAB — TSH+FREE T4
Free T4: 1.12 ng/dL (ref 0.82–1.77)
TSH: 1.13 u[IU]/mL (ref 0.450–4.500)

## 2023-07-04 LAB — HCV INTERPRETATION

## 2023-07-04 LAB — HEMOGLOBIN A1C
Est. average glucose Bld gHb Est-mCnc: 140 mg/dL
Hgb A1c MFr Bld: 6.5 % — ABNORMAL HIGH (ref 4.8–5.6)

## 2023-07-04 LAB — VITAMIN D 25 HYDROXY (VIT D DEFICIENCY, FRACTURES): Vit D, 25-Hydroxy: 63.3 ng/mL (ref 30.0–100.0)

## 2023-07-04 LAB — HCV AB W REFLEX TO QUANT PCR: HCV Ab: NONREACTIVE

## 2023-07-07 ENCOUNTER — Ambulatory Visit: Payer: Self-pay | Admitting: *Deleted

## 2023-07-07 NOTE — Patient Outreach (Signed)
Care Coordination   Initial Visit Note   07/07/2023 Name: Erik [name] MRN: [id number] DOB: 1945/12/03  Erik [name] is a 77 y.o. year old male who sees Erik [name], Erik Mellow, MD for primary care. I spoke with  Erik [name] by phone today.  What matters to the patients health and wellness today?  Weight loss, Pre diabetes, Atrial fibrillation Mr Erik [name] reports he is doing well. He denies any worsening symptoms today With review of recent pcp office visit he state he is aware of his HgA1c and  does not have any questions about other labs that pcp wants repeated in 3 months His A1c has notably been increasing Last value still good at 6.5  Weight loss in the last 8 months He reports his appetite is fine and he is slowly gaining weight, He voiced understanding of having a well balanced meal with proteins, vegetables, fruits and modified carbohydrate He receives support from his daughter, Erik [name], and denies any social determinants of health (SDOH) needs at this time   Goals Addressed             This Visit's Progress    CPCP CM Expected Outcome:  Monitor, Self-Manage and Reduce Symptoms of:       RN CCM (COGNITIVE IMPAIRMENT) EXPECTED OUTCOME: MONITOR, SELF-MANAGE AND REDUCE SYMPTOMS OF COGNITIVE IMPAIRMENT       Current Barriers:  Knowledge Deficits related to Cognitive Impairment Chronic Disease Management support and education needs related to Cognitive Impairment Cognitive Deficits- LCSW is working with patient/ daughter Patient now resides with daughter Erik [name], hospitalized 6/11-6/13/24 for disorientation, then discharged to short term SNF, has been working with Child psychotherapist for [employer] application, pt did not attend 02/09/23 appointment with primary care provider, daughter states she will call and reschedule this appointment, pt does not want to take some of his medications, does not wear compression hose Patient recently saw primary care provider and noted pt  does not always take medications correctly, RN Care Manager spoke with pt (was unable to reach daughter and pt states she is not home), pt reports " I'm doing good, thank you", no new concerns reported by pt Interventions Today    Flowsheet Row Most Recent Value  Chronic Disease   Chronic disease during today's visit Diabetes, Hypertension (HTN), Atrial Fibrillation (AFib), Other  [weight loss]  General Interventions   General Interventions Discussed/Reviewed General Interventions Discussed, Labs, Durable Medical Equipment (DME), Doctor Visits, Community Resources  Labs Hgb A1c every 3 months  Doctor Visits Discussed/Reviewed Doctor Visits Discussed, PCP  Durable Medical Equipment (DME) BP Cuff  [encouraged weight monitoring]  PCP/Specialist Visits Compliance with follow-up visit  Exercise Interventions   Exercise Discussed/Reviewed Exercise Discussed, Physical Activity  [reports he remains active]  Physical Activity Discussed/Reviewed Physical Activity Reviewed  Education Interventions   Education Provided Provided Education  [weight loss, HgA1c, protein intake, RN CM program]  Provided Verbal Education On Nutrition, Labs, Exercise, Medication, Community Resources  Labs Reviewed Hgb A1c  Mental Health Interventions   Mental Health Discussed/Reviewed Mental Health Discussed, Coping Strategies  Nutrition Interventions   Nutrition Discussed/Reviewed Nutrition Discussed, Increasing proteins, Decreasing sugar intake, Supplemental nutrition  Pharmacy Interventions   Pharmacy Dicussed/Reviewed Pharmacy Topics Discussed, Medications and their functions, Affording Medications  Safety Interventions   Safety Discussed/Reviewed Safety Discussed, Fall Risk, Home Safety  Home Safety Assistive Devices           RN CCM (IRON DEFICIENCY ANEMIA) EXPECTED OUTCOME: MONITOR, SELF-MANAGE AND REDUCE SYMPTOMS OF IRON  DEFICIENCY ANEMIA       Current Barriers:  Knowledge Deficits related to Iron Deficiency  Anemia management Chronic Disease Management support and education needs related to Iron Deficiency Anemia Cognitive Deficits No Advanced Directives in place  Interventions Today    Flowsheet Row Most Recent Value  Chronic Disease   Chronic disease during today's visit Diabetes, Hypertension (HTN), Atrial Fibrillation (AFib), Other  [weight loss]  General Interventions   General Interventions Discussed/Reviewed General Interventions Discussed, Labs, Durable Medical Equipment (DME), Doctor Visits, Community Resources  Labs Hgb A1c every 3 months  Doctor Visits Discussed/Reviewed Doctor Visits Discussed, PCP  Durable Medical Equipment (DME) BP Cuff  [encouraged weight monitoring]  PCP/Specialist Visits Compliance with follow-up visit  Exercise Interventions   Exercise Discussed/Reviewed Exercise Discussed, Physical Activity  [reports he remains active]  Physical Activity Discussed/Reviewed Physical Activity Reviewed  Education Interventions   Education Provided Provided Education  [weight loss, HgA1c, protein intake, RN CM program]  Provided Verbal Education On Nutrition, Labs, Exercise, Medication, Community Resources  Labs Reviewed Hgb A1c  Mental Health Interventions   Mental Health Discussed/Reviewed Mental Health Discussed, Coping Strategies  Nutrition Interventions   Nutrition Discussed/Reviewed Nutrition Discussed, Increasing proteins, Decreasing sugar intake, Supplemental nutrition  Pharmacy Interventions   Pharmacy Dicussed/Reviewed Pharmacy Topics Discussed, Medications and their functions, Affording Medications  Safety Interventions   Safety Discussed/Reviewed Safety Discussed, Fall Risk, Home Safety  Home Safety Assistive Devices           RN CCM (PERIPHERAL ARTERY DISEASE) EXPECTED OUTCOME: MONITOR, SELF-MANAGE AND REDUCE SYMPTOMS OF PERIPHERAL ARTERY DISEASE   On track    Current Barriers:  Knowledge Deficits related to Peripheral Artery Disease Care Coordination  needs related to social work in a patient with Peripheral Artery Disease Chronic Disease Management support and education needs related to Peripheral Artery Disease Cognitive Deficits No Advanced Directives in place-documents previously mailed Spoke with daughter Erik [name] who reports pt resides with her after hospitalization, she is primary caregiver, cooks meals for him, provides transportation.  Patient has cognitive impairment, forgetfulness, varies day to day, repeats same questions.  Per daughter, pt does not wear compression hose but is supposed to. Update 04/09/23- patient's daughter was not available to speak with, pt reports he continues to reside at his daughter's home, no new concerns reported  Planned Interventions: Evaluation of current treatment plan related to Peripheral Artery Disease and patient's adherence to plan as established by provider Advised patient to wear compression hose per doctor's order Reviewed medications with patient and discussed importance of taking as prescribed Reinforced with patient to wear compression hose, take medications as prescribed, drink adequate fluids   Symptom Management: Take medications as prescribed   Attend all scheduled provider appointments Call pharmacy for medication refills 3-7 days in advance of running out of medications Attend church or other social activities Call provider office for new concerns or questions  Work with the social worker to address care coordination needs and will continue to work with the clinical team to address health care and disease management related needs Reinforced with pt to wear compression daily and assist with application if needed Complete advanced directives  Follow Up Plan: Telephone follow up appointment with care management team member scheduled for:  06/10/23 at 3 pm          SDOH assessments and interventions completed:  Yes  SDOH Interventions Today    Flowsheet Row Most Recent  Value  SDOH Interventions   Food Insecurity Interventions Intervention Not Indicated  Housing Interventions Intervention Not Indicated  Transportation Interventions Intervention Not Indicated  Utilities Interventions Intervention Not Indicated  Financial Strain Interventions Intervention Not Indicated  Social Connections Interventions Intervention Not Indicated        Care Coordination Interventions:  Yes, provided   Follow up plan: Follow up call scheduled for 08/03/23    Encounter Outcome:  Patient Visit Completed   Cala Bradford L. Noelle Penner, RN, BSN, [facility]  [facility] Care Management Coordinator  [phone]  Fax: [phone]

## 2023-07-07 NOTE — Patient Instructions (Addendum)
Visit Information  Thank you for taking time to visit with me today. Please don't hesitate to contact me if I can be of assistance to you.   Following are the goals we discussed today:   Goals Addressed             This Visit's Progress    CPCP CM Expected Outcome:  Monitor, Self-Manage and Reduce Symptoms of:       RN CCM (COGNITIVE IMPAIRMENT) EXPECTED OUTCOME: MONITOR, SELF-MANAGE AND REDUCE SYMPTOMS OF COGNITIVE IMPAIRMENT       Current Barriers:  Knowledge Deficits related to Cognitive Impairment Chronic Disease Management support and education needs related to Cognitive Impairment Cognitive Deficits- LCSW is working with patient/ daughter Patient now resides with daughter [name], hospitalized 6/11-6/13/24 for disorientation, then discharged to short term SNF, has been working with Child psychotherapist for [employer] application, pt did not attend 02/09/23 appointment with primary care provider, daughter states she will call and reschedule this appointment, pt does not want to take some of his medications, does not wear compression hose Patient recently saw primary care provider and noted pt does not always take medications correctly, RN Care Manager spoke with pt (was unable to reach daughter and pt states she is not home), pt reports " I'm doing good, thank you", no new concerns reported by pt Interventions Today    Flowsheet Row Most Recent Value  Chronic Disease   Chronic disease during today's visit Diabetes, Hypertension (HTN), Atrial Fibrillation (AFib), Other  [weight loss]  General Interventions   General Interventions Discussed/Reviewed General Interventions Discussed, Labs, Durable Medical Equipment (DME), Doctor Visits, Community Resources  Labs Hgb A1c every 3 months  Doctor Visits Discussed/Reviewed Doctor Visits Discussed, PCP  Durable Medical Equipment (DME) BP Cuff  [encouraged weight monitoring]  PCP/Specialist Visits Compliance with follow-up visit  Exercise  Interventions   Exercise Discussed/Reviewed Exercise Discussed, Physical Activity  [reports he remains active]  Physical Activity Discussed/Reviewed Physical Activity Reviewed  Education Interventions   Education Provided Provided Education  [weight loss, HgA1c, protein intake, RN CM program]  Provided Verbal Education On Nutrition, Labs, Exercise, Medication, Community Resources  Labs Reviewed Hgb A1c  Mental Health Interventions   Mental Health Discussed/Reviewed Mental Health Discussed, Coping Strategies  Nutrition Interventions   Nutrition Discussed/Reviewed Nutrition Discussed, Increasing proteins, Decreasing sugar intake, Supplemental nutrition  Pharmacy Interventions   Pharmacy Dicussed/Reviewed Pharmacy Topics Discussed, Medications and their functions, Affording Medications  Safety Interventions   Safety Discussed/Reviewed Safety Discussed, Fall Risk, Home Safety  Home Safety Assistive Devices           RN CCM (IRON DEFICIENCY ANEMIA) EXPECTED OUTCOME: MONITOR, SELF-MANAGE AND REDUCE SYMPTOMS OF IRON DEFICIENCY ANEMIA       Current Barriers:  Knowledge Deficits related to Iron Deficiency Anemia management Chronic Disease Management support and education needs related to Iron Deficiency Anemia Cognitive Deficits No Advanced Directives in place  Interventions Today    Flowsheet Row Most Recent Value  Chronic Disease   Chronic disease during today's visit Diabetes, Hypertension (HTN), Atrial Fibrillation (AFib), Other  [weight loss]  General Interventions   General Interventions Discussed/Reviewed General Interventions Discussed, Labs, Durable Medical Equipment (DME), Doctor Visits, Community Resources  Labs Hgb A1c every 3 months  Doctor Visits Discussed/Reviewed Doctor Visits Discussed, PCP  Durable Medical Equipment (DME) BP Cuff  [encouraged weight monitoring]  PCP/Specialist Visits Compliance with follow-up visit  Exercise Interventions   Exercise Discussed/Reviewed  Exercise Discussed, Physical Activity  [reports he remains active]  Physical  Activity Discussed/Reviewed Physical Activity Reviewed  Education Interventions   Education Provided Provided Education  [weight loss, HgA1c, protein intake, RN CM program]  Provided Verbal Education On Nutrition, Labs, Exercise, Medication, Community Resources  Labs Reviewed Hgb A1c  Mental Health Interventions   Mental Health Discussed/Reviewed Mental Health Discussed, Coping Strategies  Nutrition Interventions   Nutrition Discussed/Reviewed Nutrition Discussed, Increasing proteins, Decreasing sugar intake, Supplemental nutrition  Pharmacy Interventions   Pharmacy Dicussed/Reviewed Pharmacy Topics Discussed, Medications and their functions, Affording Medications  Safety Interventions   Safety Discussed/Reviewed Safety Discussed, Fall Risk, Home Safety  Home Safety Assistive Devices           RN CCM (PERIPHERAL ARTERY DISEASE) EXPECTED OUTCOME: MONITOR, SELF-MANAGE AND REDUCE SYMPTOMS OF PERIPHERAL ARTERY DISEASE   On track    Current Barriers:  Knowledge Deficits related to Peripheral Artery Disease Care Coordination needs related to social work in a patient with Peripheral Artery Disease Chronic Disease Management support and education needs related to Peripheral Artery Disease Cognitive Deficits No Advanced Directives in place-documents previously mailed Spoke with daughter [name] who reports pt resides with her after hospitalization, she is primary caregiver, cooks meals for him, provides transportation.  Patient has cognitive impairment, forgetfulness, varies day to day, repeats same questions.  Per daughter, pt does not wear compression hose but is supposed to. Update 04/09/23- patient's daughter was not available to speak with, pt reports he continues to reside at his daughter's home, no new concerns reported  Planned Interventions: Evaluation of current treatment plan related to Peripheral  Artery Disease and patient's adherence to plan as established by provider Advised patient to wear compression hose per doctor's order Reviewed medications with patient and discussed importance of taking as prescribed Reinforced with patient to wear compression hose, take medications as prescribed, drink adequate fluids   Symptom Management: Take medications as prescribed   Attend all scheduled provider appointments Call pharmacy for medication refills 3-7 days in advance of running out of medications Attend church or other social activities Call provider office for new concerns or questions  Work with the social worker to address care coordination needs and will continue to work with the clinical team to address health care and disease management related needs Reinforced with pt to wear compression daily and assist with application if needed Complete advanced directives  Follow Up Plan: Telephone follow up appointment with care management team member scheduled for:  06/10/23 at 3 pm          Our next appointment is by telephone on 08/03/23 at 1 pm  Please call the care guide team at [phone] if you need to cancel or reschedule your appointment.   If you are experiencing a Mental Health or Behavioral Health Crisis or need someone to talk to, please call the Suicide and Crisis Lifeline: 988 call the [place] National Suicide Prevention Lifeline: (708)268-7989 or TTY: 828-069-5168 TTY 307-320-6295) to talk to a trained counselor call 1-800-273-TALK (toll free, 24 hour hotline) call the [facility]: 867-812-4127 call 911   Patient verbalizes understanding of instructions and care plan provided today and agrees to view in MyChart. Active MyChart status and patient understanding of how to access instructions and care plan via MyChart confirmed with patient.     The patient has been provided with contact information for the care management team and has been advised to  call with any health related questions or concerns.   Lanier Felty L. Noelle Penner, RN, BSN, CCM  [facility] Care Management Coordinator  [phone]  Fax: [phone]

## 2023-07-13 ENCOUNTER — Telehealth: Payer: Self-pay | Admitting: *Deleted

## 2023-07-13 NOTE — Telephone Encounter (Signed)
-----   Message from Dina Rich sent at 07/06/2023  3:30 PM EST ----- Monitor does not show any signs of afib which is good. Has some extra heart beats that are not dangerous, they can sometimes cause a feeling of heart skipping or fluttering, at our appt however he denied any symptoms. If still no symptoms nothing additional required at this time  Dominga Ferry MD

## 2023-07-13 NOTE — Telephone Encounter (Signed)
Lesle Chris, LPN [id number]/11/5407  8:11 PM EST Back to Top    Notified, copy to pcp.

## 2023-07-27 ENCOUNTER — Ambulatory Visit: Payer: Medicare Other | Admitting: *Deleted

## 2023-07-27 NOTE — Patient Instructions (Signed)
Visit Information  Thank you for taking time to visit with me today. Please don't hesitate to contact me if I can be of assistance to you.   Following are the goals we discussed today:   Goals Addressed               This Visit's Progress     Receive Assistance Applying for Personal Care Services. (pt-stated)   On track     Care Coordination Interventions:   Interventions Today    Flowsheet Row Most Recent Value  Chronic Disease   Chronic disease during today's visit Atrial Fibrillation (AFib), Other, Hypertension (HTN)  [Lambert-Eaton myasthenic syndrome, Vitamin D Deficiency, Severe Protein-Calorie Malnutrition, Weight Loss, Iron Deficiency Anemia, B12 deficiency, Prediabetes, Neurocognitive Deficits, Cognitive Impairment, Bilateral Lower Extremity Edema.]  General Interventions   General Interventions Discussed/Reviewed General Interventions Discussed, General Interventions Reviewed, Annual Eye Exam, Durable Medical Equipment (DME), Annual Foot Exam, Labs, Vaccines, Doctor Visits, Health Screening, [employer], Level of Care, Communication with  [Encouraged Routine Engagement with Care Team Members.]  Labs Hgb A1c every 3 months  [Encouraged Routine Labs.]  Vaccines COVID-19, Flu, Pneumonia, RSV, Shingles, Tetanus/Pertussis/Diphtheria  [Encouraged Annual Vaccinations.]  Doctor Visits Discussed/Reviewed Doctor Visits Discussed, Specialist, Doctor Visits Reviewed, Annual Wellness Visits, PCP  [Encouraged Routine Engagement with Care Team Members.]  Health Screening Bone Density, Colonoscopy, Prostate  [Encouraged Annual Health Screenings.]  Durable Medical Equipment (DME) BP Cuff, Other  [Cane, Scales.]  PCP/Specialist Visits Compliance with follow-up visit  [Encouraged Routine Engagement with Care Team Members.]  Communication with PCP/Specialists, RN, Pharmacists, Social Work  [employer] Routine Engagement with Care Team Members.]  Level of Care Adult Daycare, Air traffic controller,  Assisted Living, Skilled Nursing Facility  [Confirmed Continued Disinterest in Enrollment in Adult Day Care Programs or Receiving Assistance Pursuing Higher Level of Care Placement Options (I.e Extended Care, Memory Care Assisted Living, Rest Home, Etc.).]  Applications Medicaid, Personal Care Services  [place] Active [place] Access Medicaid Status & Continued Disinterest in Applying for Personal Care Services.]  Exercise Interventions   Exercise Discussed/Reviewed Exercise Discussed, Assistive device use and maintanence, Exercise Reviewed, Physical Activity, Weight Managment  [Encouraged Increased Level of Activity & Exercise, Inside & Outside the Home.]  Physical Activity Discussed/Reviewed Physical Activity Discussed, Home Exercise Program (HEP), Physical Activity Reviewed, PREP, Gym, Types of exercise  [Encouraged Daily Exercise Regimen, as Tolerated.]  Weight Management Weight maintenance  [Encouraged Supplemental Nutrition.]  Education Interventions   Education Provided Provided Education  Manufacturing systems engineer to [employer] Understanding.]  Provided Verbal Education On Nutrition, Foot Care, Eye Care, Labs, Medication, Insurance Plans, [employer], Exercise, Air traffic controller, Mental Health/Coping with Illness, When to see the doctor  [Encouraged Consideration & Implementation.]  Labs Reviewed Hgb A1c  Applications Medicaid, Personal Care Services  [place] Active [place] Access Medicaid Status & Continued Disinterest in Applying for Personal Care Services.]  Mental Health Interventions   Mental Health Discussed/Reviewed Mental Health Discussed, Anxiety, Depression, Grief and Loss, Mental Health Reviewed, Substance Abuse, Coping Strategies, Suicide, Crisis, Other  [Assessed Mental Health & Cognitive Status.]  Nutrition Interventions   Nutrition Discussed/Reviewed Nutrition Discussed, Adding fruits and vegetables, Increasing proteins, Nutrition Reviewed, Decreasing fats,  Fluid intake, Carbohydrate meal planning, Decreasing salt, Portion sizes, Decreasing sugar intake, Supplemental nutrition  [Encouraged Heart-Healthy, Diabetic-Friendly, Protein Rich Diet.]  Pharmacy Interventions   Pharmacy Dicussed/Reviewed Pharmacy Topics Discussed, Medications and their functions, Medication Adherence, Pharmacy Topics Reviewed, Affording Medications  [Confirmed Compliance with Prescription Medications.]  Medication Adherence --  [Confirmed Ability to [employer]  Prescription Medications.]  Safety Interventions   Safety Discussed/Reviewed Safety Discussed, Safety Reviewed, Fall Risk, Home Safety  [Encouraged Consideration of Home Safety Evaluation.]  Home Safety Assistive Devices, Need for home safety assessment  [Encouraged Routine Use of Assistive Devices.]  Advanced Directive Interventions   Advanced Directives Discussed/Reviewed Advanced Directives Discussed, Advanced Directives Reviewed  [Encouraged Initiation of Advanced Directives (Living Will & Healthcare Power of Corporate treasurer), Offering to [employer], Assist with Completion, Make Copies & Scan into Electronic Medical Record in Epic.]       Active Listening & Reflection Utilized.  Verbalization of Feelings Encouraged.  Emotional Support Provided. Cognitive Behavioral Therapy Indicated. Client-Centered Therapy Performed. Acceptance & Commitment Therapy Initiated. Encouraged Routine Engagement with Representative from Aging, Disability & Transit Services of [place] [phone]) to Check Status of Name on Waiting List for Prepared Meal Delivery Services, through Meals-on-Wheels.  Encouraged Routine Engagement with Danford Bad, Licensed Clinical Social Worker with [facility] [phone]), if You Have Questions, Need Assistance, or If Additional Social Work Needs Are Identified Between Now & Our Next Follow-Up Outreach Call, Scheduled on 08/25/2023 at 9:45 AM.      Our next  appointment is by telephone on 08/25/2023 at 9:45 am.  Please call the care guide team at [phone] if you need to cancel or reschedule your appointment.   If you are experiencing a Mental Health or Behavioral Health Crisis or need someone to talk to, please call the Suicide and Crisis Lifeline: 988 call the [place] National Suicide Prevention Lifeline: 6803892231 or TTY: (657)252-3057 TTY (302)741-2324) to talk to a trained counselor call 1-800-273-TALK (toll free, 24 hour hotline) go to [facility] [address] [place] [phone]) call the [facility] Crisis Line: [phone] call 911  Patient verbalizes understanding of instructions and care plan provided today and agrees to view in MyChart. Active MyChart status and patient understanding of how to access instructions and care plan via MyChart confirmed with patient.     Telephone follow up appointment with care management team member scheduled for:  08/25/2023 at 9:45 am.  Danford Bad, BSW, MSW, LCSW  Embedded Practice Social Work Case Manager  [facility], Population Health Direct Dial: [phone]  Fax: [phone] Email: [email or web address]: [email or web address]

## 2023-07-27 NOTE — Patient Outreach (Signed)
Care Coordination   Follow Up Visit Note   07/27/2023  Name: Erik [name] MRN: [id number] DOB: [birth year]  Erik [name] is a 77 y.o. year old male who sees [name], [name], MD for primary care. I spoke with Erik [name] and daughter, [name] by phone today.  What matters to the patients health and wellness today?  Receive Assistance Applying for Personal Care Services.    Goals Addressed               This Visit's Progress     Receive Assistance Applying for Personal Care Services. (pt-stated)   On track     Care Coordination Interventions:   Interventions Today    Flowsheet Row Most Recent Value  Chronic Disease   Chronic disease during today's visit Atrial Fibrillation (AFib), Other, Hypertension (HTN)  [Lambert-Eaton myasthenic syndrome, Vitamin D Deficiency, Severe Protein-Calorie Malnutrition, Weight Loss, Iron Deficiency Anemia, B12 deficiency, Prediabetes, Neurocognitive Deficits, Cognitive Impairment, Bilateral Lower Extremity Edema.]  General Interventions   General Interventions Discussed/Reviewed General Interventions Discussed, General Interventions Reviewed, Annual Eye Exam, Durable Medical Equipment (DME), Annual Foot Exam, Labs, Vaccines, Doctor Visits, Health Screening, [employer], Level of Care, Communication with  [Encouraged Routine Engagement with Care Team Members.]  Labs Hgb A1c every 3 months  [Encouraged Routine Labs.]  Vaccines COVID-19, Flu, Pneumonia, RSV, Shingles, Tetanus/Pertussis/Diphtheria  [Encouraged Annual Vaccinations.]  Doctor Visits Discussed/Reviewed Doctor Visits Discussed, Specialist, Doctor Visits Reviewed, Annual Wellness Visits, PCP  [Encouraged Routine Engagement with Care Team Members.]  Health Screening Bone Density, Colonoscopy, Prostate  [Encouraged Annual Health Screenings.]  Durable Medical Equipment (DME) BP Cuff, Other  [Cane, Scales.]  PCP/Specialist Visits Compliance with follow-up visit   [Encouraged Routine Engagement with Care Team Members.]  Communication with PCP/Specialists, RN, Pharmacists, Social Work  [employer] Routine Engagement with Care Team Members.]  Level of Care Adult Daycare, Air traffic controller, Assisted Living, Skilled Nursing Facility  [Confirmed Continued Disinterest in Enrollment in Adult Day Care Programs or Receiving Assistance Pursuing Higher Level of Care Placement Options (I.e Extended Care, Memory Care Assisted Living, Rest Home, Etc.).]  Applications Medicaid, Personal Care Services  [place] Active [place] Access Medicaid Status & Continued Disinterest in Applying for Personal Care Services.]  Exercise Interventions   Exercise Discussed/Reviewed Exercise Discussed, Assistive device use and maintanence, Exercise Reviewed, Physical Activity, Weight Managment  [Encouraged Increased Level of Activity & Exercise, Inside & Outside the Home.]  Physical Activity Discussed/Reviewed Physical Activity Discussed, Home Exercise Program (HEP), Physical Activity Reviewed, PREP, Gym, Types of exercise  [Encouraged Daily Exercise Regimen, as Tolerated.]  Weight Management Weight maintenance  [Encouraged Supplemental Nutrition.]  Education Interventions   Education Provided Provided Education  Manufacturing systems engineer to [employer] Understanding.]  Provided Verbal Education On Nutrition, Foot Care, Eye Care, Labs, Medication, Insurance Plans, [employer], Exercise, Air traffic controller, Mental Health/Coping with Illness, When to see the doctor  [Encouraged Consideration & Implementation.]  Labs Reviewed Hgb A1c  Applications Medicaid, Personal Care Services  [place] Active [place] Access Medicaid Status & Continued Disinterest in Applying for Personal Care Services.]  Mental Health Interventions   Mental Health Discussed/Reviewed Mental Health Discussed, Anxiety, Depression, Grief and Loss, Mental Health Reviewed, Substance Abuse, Coping Strategies, Suicide,  Crisis, Other  [Assessed Mental Health & Cognitive Status.]  Nutrition Interventions   Nutrition Discussed/Reviewed Nutrition Discussed, Adding fruits and vegetables, Increasing proteins, Nutrition Reviewed, Decreasing fats, Fluid intake, Carbohydrate meal planning, Decreasing salt, Portion sizes, Decreasing sugar intake, Supplemental nutrition  [Encouraged Heart-Healthy, Diabetic-Friendly, Protein Rich Diet.]  Pharmacy Interventions   Pharmacy Dicussed/Reviewed Pharmacy Topics Discussed, Medications and their functions, Medication Adherence, Pharmacy Topics Reviewed, Affording Medications  [Confirmed Compliance with Prescription Medications.]  Medication Adherence --  [Confirmed Ability to Afford Prescription Medications.]  Safety Interventions   Safety Discussed/Reviewed Safety Discussed, Safety Reviewed, Fall Risk, Home Safety  [Encouraged Consideration of Home Safety Evaluation.]  Home Safety Assistive Devices, Need for home safety assessment  [Encouraged Routine Use of Assistive Devices.]  Advanced Directive Interventions   Advanced Directives Discussed/Reviewed Advanced Directives Discussed, Advanced Directives Reviewed  [Encouraged Initiation of Advanced Directives (Living Will & Healthcare Power of Corporate treasurer), Offering to [employer], Assist with Completion, Make Copies & Scan into Electronic Medical Record in Epic.]       Active Listening & Reflection Utilized.  Verbalization of Feelings Encouraged.  Emotional Support Provided. Cognitive Behavioral Therapy Indicated. Client-Centered Therapy Performed. Acceptance & Commitment Therapy Initiated. Encouraged Routine Engagement with Representative from Aging, Disability & Transit Services of [place] [phone]) to Check Status of Name on Waiting List for Prepared Meal Delivery Services, through Meals-on-Wheels.  Encouraged Routine Engagement with Danford Bad, Licensed Clinical Social Worker with [facility] [phone]), if You Have Questions, Need Assistance, or If Additional Social Work Needs Are Identified Between Now & Our Next Follow-Up Outreach Call, Scheduled on 08/25/2023 at 9:45 AM.      SDOH assessments and interventions completed:  Yes.  Care Coordination Interventions:  Yes, provided.   Follow up plan: Follow up call scheduled for 08/25/2023 at 9:45 am.  Encounter Outcome:  Patient Visit Completed.   Danford Bad, BSW, MSW, Printmaker Social Work Case Set designer Health  [facility], Population Health Direct Dial: [phone]  Fax: [phone] Email: [email or web address] Website: [email or web address]

## 2023-08-03 ENCOUNTER — Ambulatory Visit: Payer: Self-pay | Admitting: *Deleted

## 2023-08-03 NOTE — Patient Outreach (Signed)
  Care Coordination   08/03/2023 Name: NELSON FREDERIQUE MRN: [id number] DOB: 05-16-1946   Care Coordination Outreach Attempts:  An unsuccessful telephone outreach was attempted today to offer the patient information about available complex care management services.  Follow Up Plan:  Additional outreach attempts will be made to offer the patient complex care management information and services.   Encounter Outcome:  No Answer   Care Coordination Interventions:  No, not indicated Unable to leave a message as the mailbox is full   Joanne Salah L. Noelle Penner, RN, BSN, [facility]  VBCI Care Management Coordinator  [phone]  Fax: [phone]

## 2023-08-25 ENCOUNTER — Ambulatory Visit: Payer: Self-pay | Admitting: *Deleted

## 2023-08-25 NOTE — Patient Instructions (Signed)
 Visit Information  Thank you for taking time to visit with me today. Please don't hesitate to contact me if I can be of assistance to you.   Following are the goals we discussed today:   Goals Addressed               This Visit's Progress     COMPLETED: Receive Assistance Applying for Personal Care Services. (pt-stated)   On track     Care Coordination Interventions:  Interventions Today    Flowsheet Row Most Recent Value  Chronic Disease   Chronic disease during today's visit Atrial Fibrillation (AFib), Other  [Lambert-Eaton myasthenic syndrome, Vitamin D  Deficiency, Severe Protein-Calorie Malnutrition, Weight Loss, Iron Deficiency Anemia, B12 deficiency, Prediabetes, Neurocognitive Deficits, Cognitive Impairment, Bilateral Lower Extremity Edema.]  General Interventions   General Interventions Discussed/Reviewed General Interventions Discussed, General Interventions Reviewed, Annual Eye Exam, Level of Care, [facility], Horticulturist, commercial (DME), Communication with, Doctor Visits, Labs  [Encouraged Routine Engagement with Care Team Members & Providers.]  Labs Hgb A1c every 3 months, Kidney Function, Hgb A1c annually  [Encouraged Routine Labwork.]  Doctor Visits Discussed/Reviewed Doctor Visits Discussed, Specialist, Doctor Visits Reviewed, Annual Wellness Visits, PCP  [Encouraged Routine Engagement with Care Team Members & Providers.]  Horticulturist, commercial (DME) BP Cuff, Other  [Cane, Scales, Prescription Eyeglasses.]  PCP/Specialist Visits Compliance with follow-up visit  [Encouraged Routine Engagement with Care Team Members & Providers.]  Communication with PCP/Specialists, RN, Pharmacists, Social Work  [employer] Routine Engagement with Care Team Members & Providers.]  Level of Care Adult Daycare, Air traffic controller, Assisted Living, Skilled Nursing Facility  [Confirmed Disinterest in Enrollment in Adult Day Care Program or Receiving Assistance Pursuing Higher Level of  Care Placement Options (I.e Extended Care, Memory Care Assisted Living, Rest Home, Etc.).]  Applications Medicaid, Personal Care Services  [place] Active [place] Access Medicaid Status & Continued Disinterest in Applying for Personal Care Services.]  Exercise Interventions   Exercise Discussed/Reviewed Exercise Discussed, Assistive device use and maintanence, Exercise Reviewed, Physical Activity, Weight Managment  [Encouraged Increased Level of Activity & Exercise, Inside & Outside the Home.]  Physical Activity Discussed/Reviewed Physical Activity Discussed, Home Exercise Program (HEP), PREP, Physical Activity Reviewed, Gym, Types of exercise  [Encouraged Daily Exercise Regimen, as Tolerated.]  Weight Management Weight maintenance  [Encouraged Healthy Diet & Supplemental Nutrition.]  Education Interventions   Education Provided Provided Education  [Thoroughly Reviewed Educational Material to Ensure Understanding.]  Provided Verbal Education On Nutrition, Mental Health/Coping with Illness, When to see the doctor, Foot Care, Eye Care, Labs, Applications, Exercise, Medication, [employer], Development worker, community  [employer] Consideration & Implementation of Educational Material Reviewed.]  Labs Reviewed Hgb A1c  [Reviewed.]  Ship broker, Personal Care Services  [place] Active [place] Access Medicaid Status & Continued Disinterest in Applying for Personal Care Services.]  Mental Health Interventions   Mental Health Discussed/Reviewed Mental Health Discussed, Anxiety, Depression, Grief and Loss, Mental Health Reviewed, Substance Abuse, Coping Strategies, Suicide, Crisis, Other  [Assessed Mental Health & Cognitive Status.]  Nutrition Interventions   Nutrition Discussed/Reviewed Nutrition Discussed, Adding fruits and vegetables, Increasing proteins, Decreasing fats, Decreasing salt, Supplemental nutrition, Decreasing sugar intake, Portion sizes, Fluid intake, Nutrition Reviewed,  Carbohydrate meal planning  [Encouraged Heart-Healthy, Diabetic-Friendly, Protein Rich Diet.]  Pharmacy Interventions   Pharmacy Dicussed/Reviewed Pharmacy Topics Discussed, Medications and their functions, Pharmacy Topics Reviewed, Medication Adherence, Affording Medications  [Confirmed Compliance with Prescription Medications.]  Medication Adherence --  [Confirmed Ability to Afford Prescription Medications.]  Safety Interventions   Safety Discussed/Reviewed  Safety Discussed, Safety Reviewed  [Encouraged Routine Use of Assistive Devices & Durable Medical Equipment.]  Home Safety Assistive Devices, Need for home safety assessment  [Encouraged Consideration of Home Safety Evaluation.]  Advanced Directive Interventions   Advanced Directives Discussed/Reviewed Advanced Directives Discussed, Advanced Directives Reviewed  [Encouraged Initiation of Advanced Directives (Living Will & Healthcare Power of Corporate treasurer), Offering to [employer], Assist with Completion, Make Copies & Scan into Electronic Medical Record in Epic.]      Active Listening & Reflection Utilized.  Verbalization of Feelings Encouraged.  Emotional Support Provided. Cognitive Behavioral Therapy Initiated. Client-Centered Therapy Performed. Encouraged Routine Engagement with Representative from Aging, Disability & Transit Services of [place] [phone]), to Check Status of Name on Waiting List to Receive Prepared Meal Delivery Services through Meals-on-Wheels.  Encouraged Engagement with Timmie Footman, Licensed Clinical Social Worker with [facility] [phone]), if You Have Questions, Need Assistance, or If Additional Social Work Needs Are Identified in The Near Future.       Please call the care guide team at [phone] if you need to cancel or reschedule your appointment.   If you are experiencing a Mental Health or Behavioral Health Crisis or need someone to talk to, please call the  Suicide and Crisis Lifeline: 988 call the [place]  National Suicide Prevention Lifeline: 506-172-3814 or TTY: (567)014-8965 TTY 718-363-0283) to talk to a trained counselor call 1-800-273-TALK (toll free, 24 hour hotline) go to [facility] Care [address] [place] [phone]) call the [facility] Crisis Line: ([phone] call 911  Patient verbalizes understanding of instructions and care plan provided today and agrees to view in MyChart. Active MyChart status and patient understanding of how to access instructions and care plan via MyChart confirmed with patient.     No further follow up required.  Timmie Footman, BSW, MSW, LCSW   [facility], [facility]  Clinical Social Worker II  Direct Dial: [phone]  Fax: [phone]  Mailing Address: [facility] [address]  [place]  [address]  Website: [email or web address]

## 2023-08-25 NOTE — Patient Outreach (Signed)
 Care Coordination   Follow Up Visit Note   08/25/2023  Name: Erik [name] MRN: [id number] DOB: [birth year]  Erik [name] is a 78 y.o. year old male who sees Kermit Ped, Heath Litten, MD for primary care. I spoke with Erik [name] by phone today.  What matters to the patients health and wellness today?  Receive Assistance Applying for Personal Care Services.    Goals Addressed               This Visit's Progress     COMPLETED: Receive Assistance Applying for Personal Care Services. (pt-stated)   On track     Care Coordination Interventions:  Interventions Today    Flowsheet Row Most Recent Value  Chronic Disease   Chronic disease during today's visit Atrial Fibrillation (AFib), Other  [Lambert-Eaton myasthenic syndrome, Vitamin D  Deficiency, Severe Protein-Calorie Malnutrition, Weight Loss, Iron Deficiency Anemia, B12 deficiency, Prediabetes, Neurocognitive Deficits, Cognitive Impairment, Bilateral Lower Extremity Edema.]  General Interventions   General Interventions Discussed/Reviewed General Interventions Discussed, General Interventions Reviewed, Annual Eye Exam, Level of Care, [facility], Horticulturist, commercial (DME), Communication with, Doctor Visits, Labs  [Encouraged Routine Engagement with Care Team Members & Providers.]  Labs Hgb A1c every 3 months, Kidney Function, Hgb A1c annually  [Encouraged Routine Labwork.]  Doctor Visits Discussed/Reviewed Doctor Visits Discussed, Specialist, Doctor Visits Reviewed, Annual Wellness Visits, PCP  [Encouraged Routine Engagement with Care Team Members & Providers.]  Horticulturist, commercial (DME) BP Cuff, Other  [Cane, Scales, Prescription Eyeglasses.]  PCP/Specialist Visits Compliance with follow-up visit  [Encouraged Routine Engagement with Care Team Members & Providers.]  Communication with PCP/Specialists, RN, Pharmacists, Social Work  [employer] Routine Engagement with Care Team Members & Providers.]   Level of Care Adult Daycare, Air traffic controller, Assisted Living, Skilled Nursing Facility  [Confirmed Disinterest in Enrollment in Adult Day Care Program or Receiving Assistance Pursuing Higher Level of Care Placement Options (I.e Extended Care, Memory Care Assisted Living, Rest Home, Etc.).]  Applications Medicaid, Personal Care Services  [place] Active [place] Access Medicaid Status & Continued Disinterest in Applying for Personal Care Services.]  Exercise Interventions   Exercise Discussed/Reviewed Exercise Discussed, Assistive device use and maintanence, Exercise Reviewed, Physical Activity, Weight Managment  [Encouraged Increased Level of Activity & Exercise, Inside & Outside the Home.]  Physical Activity Discussed/Reviewed Physical Activity Discussed, Home Exercise Program (HEP), PREP, Physical Activity Reviewed, Gym, Types of exercise  [Encouraged Daily Exercise Regimen, as Tolerated.]  Weight Management Weight maintenance  [Encouraged Healthy Diet & Supplemental Nutrition.]  Education Interventions   Education Provided Provided Education  [Thoroughly Reviewed Educational Material to Ensure Understanding.]  Provided Verbal Education On Nutrition, Mental Health/Coping with Illness, When to see the doctor, Foot Care, Eye Care, Labs, Applications, Exercise, Medication, [facility], Development worker, community  [employer] Consideration & Implementation of Educational Material Reviewed.]  Labs Reviewed Hgb A1c  [Reviewed.]  Ship broker, Personal Care Services  [place] Active [place] Access Medicaid Status & Continued Disinterest in Applying for Personal Care Services.]  Mental Health Interventions   Mental Health Discussed/Reviewed Mental Health Discussed, Anxiety, Depression, Grief and Loss, Mental Health Reviewed, Substance Abuse, Coping Strategies, Suicide, Crisis, Other  [Assessed Mental Health & Cognitive Status.]  Nutrition Interventions   Nutrition Discussed/Reviewed Nutrition  Discussed, Adding fruits and vegetables, Increasing proteins, Decreasing fats, Decreasing salt, Supplemental nutrition, Decreasing sugar intake, Portion sizes, Fluid intake, Nutrition Reviewed, Carbohydrate meal planning  [Encouraged Heart-Healthy, Diabetic-Friendly, Protein Rich Diet.]  Pharmacy Interventions   Pharmacy Dicussed/Reviewed Pharmacy Topics Discussed, Medications and their  functions, Pharmacy Topics Reviewed, Medication Adherence, Affording Medications  [Confirmed Compliance with Prescription Medications.]  Medication Adherence --  [Confirmed Ability to [employer] Prescription Medications.]  Safety Interventions   Safety Discussed/Reviewed Safety Discussed, Safety Reviewed  [Encouraged Routine Use of Assistive Devices & Durable Medical Equipment.]  Home Safety Assistive Devices, Need for home safety assessment  [Encouraged Consideration of Home Safety Evaluation.]  Advanced Directive Interventions   Advanced Directives Discussed/Reviewed Advanced Directives Discussed, Advanced Directives Reviewed  [Encouraged Initiation of Advanced Directives (Living Will & Healthcare Power of Corporate treasurer), Offering to [employer], Assist with Completion, Make Copies & Scan into Electronic Medical Record in Epic.]      Active Listening & Reflection Utilized.  Verbalization of Feelings Encouraged.  Emotional Support Provided. Cognitive Behavioral Therapy Initiated. Client-Centered Therapy Performed. Encouraged Routine Engagement with Representative from Aging, [facility]ability [facility] [place] [phone]), to Check Status of Name on Waiting List to Receive Prepared Meal Delivery Services through [employer].  Encouraged Engagement with Timmie Footman, Licensed Clinical Social Worker with [facility] [phone]), if You Have Questions, Need Assistance, or If Additional Social Work Needs Are Identified in The Near Future.       SDOH assessments and  interventions completed:  Yes.  Care Coordination Interventions:  Yes, provided.   Follow up plan: No further intervention required.   Encounter Outcome:  Patient Visit Completed.   Timmie Footman, BSW, MSW, LCSW   [facility]  Clinical Social Worker II  Direct Dial: [phone]  Fax: [phone]  Mailing Address: [facility] [address]  [place]  [address]  Website: [email or web address]

## 2023-09-09 ENCOUNTER — Encounter (INDEPENDENT_AMBULATORY_CARE_PROVIDER_SITE_OTHER): Payer: Self-pay | Admitting: Nurse Practitioner

## 2023-09-09 DIAGNOSIS — I739 Peripheral vascular disease, unspecified: Secondary | ICD-10-CM

## 2023-09-10 ENCOUNTER — Other Ambulatory Visit (INDEPENDENT_AMBULATORY_CARE_PROVIDER_SITE_OTHER): Payer: Self-pay | Admitting: Nurse Practitioner

## 2023-09-10 DIAGNOSIS — I739 Peripheral vascular disease, unspecified: Secondary | ICD-10-CM

## 2023-09-12 NOTE — Progress Notes (Unsigned)
MRN : [id number]  Erik [name] is a 78 y.o. (Feb 10, 1946) male who presents with chief complaint of check circulation.  History of Present Illness:   The patient returns to the office for followup and review of the noninvasive studies.    There have been no interval changes in lower extremity symptoms. No interval shortening of the patient's claudication distance or development of rest pain symptoms. No new ulcers or wounds have occurred since the last visit.   He continues to have lower extremity edema but it is improved from his previous follow-up.   There have been no significant changes to the patient's overall health care.   The patient denies amaurosis fugax or recent TIA symptoms. There are no documented recent neurological changes noted. There is no history of DVT, PE or superficial thrombophlebitis. The patient denies recent episodes of angina or shortness of breath.    ABI Rt=1.04 and Lt=0.74  (previous ABI's Rt=0.98 and Lt=0.83 ).  Previous duplex ultrasound of the right lower extremity shows triphasic waveforms with monophasic in the left.  However the study was somewhat limited due to the severe skin changes present in the lower extremities.    No outpatient medications have been marked as taking for the 09/13/23 encounter (Appointment) with Gilda Crease, Latina Craver, MD.    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 Jennell Corner external and internal hemorrhoids, redundant left colon   ESOPHAGEAL DILATION N/A 11/12/2014   Procedure: ESOPHAGEAL DILATION;  Surgeon: West Bali, MD;  [facility]  [facility];  Service: Endoscopy;  Laterality: N/A;   ESOPHAGOGASTRODUODENOSCOPY N/A 11/12/2014   Dr. Fields:mild non-erosive gastritis. negative H.pylori    None      Social History Social History   Tobacco Use   Smoking status: Former    Current packs/day: 0.00    Average packs/day: 0.5 packs/day for 21.0 years (10.5 ttl pk-yrs)    Types: Cigarettes    Start date: 01/22/1963    Quit date: 08/11/1983    Years since quitting: 40.1    Passive exposure: Past   Smokeless tobacco: Never   Tobacco comments:    Verified by Daughter, [name]  Vaping Use   Vaping status: Never Used  Substance Use Topics   Alcohol use: No    Alcohol/week: 0.0 standard drinks of alcohol    Comment: remote past, history of ETOH abuse.    Drug use: No    Family History Family History  Problem Relation Age of Onset   Dementia Mother    Crohn's disease Mother    Cancer Father    Sickle cell anemia Daughter    Stomach cancer Maternal Grandfather    Colon cancer Neg Hx     No Known Allergies  REVIEW OF SYSTEMS (Negative unless checked)  Constitutional: [] Weight loss  [] Fever  [] Chills Cardiac: [] Chest pain   [] Chest pressure   [] Palpitations   [] Shortness of breath when laying flat   [] Shortness of breath with exertion. Vascular:  [x] Pain in legs with walking   [] Pain in legs at rest  [] History of DVT   [] Phlebitis   [] Swelling in legs   [] Varicose veins   [] Non-healing ulcers Pulmonary:   [] Uses home oxygen   [] Productive cough   [] Hemoptysis   [] Wheeze  [] COPD   [] Asthma Neurologic:  [] Dizziness   [] Seizures   [] History of stroke   [] History of TIA  [] Aphasia   [] Vissual changes   [] Weakness or numbness in arm   [] Weakness or numbness in leg Musculoskeletal:   [] Joint swelling   [] Joint pain   [] Low back pain Hematologic:  [] Easy bruising  [] Easy bleeding   [] Hypercoagulable state   [] Anemic Gastrointestinal:  [] Diarrhea   [] Vomiting  [] Gastroesophageal reflux/heartburn   [] Difficulty  swallowing. Genitourinary:  [] Chronic kidney disease   [] Difficult urination  [] Frequent urination   [] Blood in urine Skin:  [] Rashes   [] Ulcers  Psychological:  [] History of anxiety   []  History of major depression.  Physical Examination  There were no vitals filed for this visit. There is no height or weight on file to calculate BMI. Gen: WD/WN, NAD Head: [place]/AT, No temporalis wasting.  Ear/Nose/Throat: Hearing grossly intact, nares w/o erythema or drainage Eyes: PER, EOMI, sclera nonicteric.  Neck: Supple, no masses.  No bruit or JVD.  Pulmonary:  Good air movement, no audible wheezing, no use of accessory muscles.  Cardiac: RRR, normal S1, S2, no Murmurs. Vascular:  mild trophic changes, no open wounds Vessel Right Left  Radial Palpable Palpable  PT Not Palpable Not Palpable  DP Not Palpable Not Palpable  Gastrointestinal: soft, non-distended. No guarding/no peritoneal signs.  Musculoskeletal: M/S 5/5 throughout.  No visible deformity.  Neurologic: CN 2-12 intact. Pain and light touch intact in extremities.  Symmetrical.  Speech is fluent. Motor exam as listed above. Psychiatric: Judgment intact, Mood & affect appropriate for pt's clinical situation. Dermatologic: No rashes or ulcers noted.  No changes consistent with cellulitis.   CBC Lab Results  Component Value Date   WBC 9.6 07/02/2023   HGB 11.4 (L) 07/02/2023   HCT 37.0 (L) 07/02/2023   MCV 72 (L) 07/02/2023   PLT 229 07/02/2023    BMET    Component Value Date/Time   NA 141 07/02/2023 1642   K 4.4 07/02/2023 1642   CL 102 07/02/2023 1642   CO2 23 07/02/2023 1642   GLUCOSE 104 (H) 07/02/2023 1642   GLUCOSE 93 01/25/2023 0800   BUN 23 07/02/2023 1642   CREATININE 1.53 (H) 07/02/2023 1642   CREATININE 1.22 (H) 08/09/2019 0809   CALCIUM 10.0 07/02/2023 1642   GFRNONAA >60 01/25/2023 0800   GFRAA >60 12/03/2017 1148   CrCl cannot be calculated (Patient's most recent lab result is older than the maximum 21  days allowed.).  COAG No results found for: "INR", "PROTIME"  Radiology No results found.   Assessment/Plan 1. PAD (peripheral artery disease) (HCC) (Primary)  Recommend:  The patient has evidence of atherosclerosis of the lower extremities with claudication.  The patient does not voice lifestyle limiting changes at this point in time.  Noninvasive studies do not suggest clinically significant change.  No invasive studies, angiography or surgery at this time The patient should continue walking and begin a more formal exercise  program.  The patient should continue antiplatelet therapy and aggressive treatment of the lipid abnormalities  No changes in the patient's medications at this time  Continued surveillance is indicated as atherosclerosis is likely to progress with time.    The patient will continue follow up with noninvasive studies as ordered.    - VAS [place] ABI WITH/WO TBI; Future  2. Bilateral lower extremity edema Recommend:  No surgery or intervention at this point in time.  I have reviewed my discussion with the patient regarding venous insufficiency and why it causes symptoms. I have discussed with the patient the chronic skin changes that accompany venous insufficiency and the long term sequela such as ulceration. Patient will contnue wearing graduated compression stockings on a daily basis, as this has provided excellent control of his edema. The patient will put the stockings on first thing in the morning and removing them in the evening. The patient is reminded not to sleep in the stockings.  In addition, behavioral modification including elevation during the day will be initiated. Exercise is strongly encouraged.  Previous duplex ultrasound of the lower extremities shows normal deep system, no significant superficial reflux was identified.  Given the patient's good control and lack of any problems regarding the venous insufficiency and lymphedema a lymph pump in  not need at this time.    3. Hyperlipidemia LDL goal <100 Continue statin as ordered and reviewed, no changes at this time  4. Atrial fibrillation with RVR (HCC) Continue antiarrhythmia medications as already ordered, these medications have been reviewed and there are no changes at this time.  Continue anticoagulation as ordered by Cardiology Service    Levora Dredge, MD  09/12/2023 3:49 PM

## 2023-09-13 ENCOUNTER — Encounter (INDEPENDENT_AMBULATORY_CARE_PROVIDER_SITE_OTHER): Payer: Self-pay | Admitting: Vascular Surgery

## 2023-09-13 ENCOUNTER — Ambulatory Visit (INDEPENDENT_AMBULATORY_CARE_PROVIDER_SITE_OTHER): Payer: Medicare Other

## 2023-09-13 ENCOUNTER — Ambulatory Visit (INDEPENDENT_AMBULATORY_CARE_PROVIDER_SITE_OTHER): Payer: Medicare Other | Admitting: Vascular Surgery

## 2023-09-13 VITALS — BP 120/76 | HR 89 | Resp 18 | Ht 74.0 in | Wt 166.2 lb

## 2023-09-13 DIAGNOSIS — I4891 Unspecified atrial fibrillation: Secondary | ICD-10-CM | POA: Diagnosis not present

## 2023-09-13 DIAGNOSIS — I739 Peripheral vascular disease, unspecified: Secondary | ICD-10-CM

## 2023-09-13 DIAGNOSIS — E785 Hyperlipidemia, unspecified: Secondary | ICD-10-CM | POA: Diagnosis not present

## 2023-09-13 DIAGNOSIS — R600 Localized edema: Secondary | ICD-10-CM | POA: Diagnosis not present

## 2023-09-13 LAB — VAS US ABI WITH/WO TBI
Left ABI: 0.74
Right ABI: 1.04

## 2023-09-14 ENCOUNTER — Encounter (INDEPENDENT_AMBULATORY_CARE_PROVIDER_SITE_OTHER): Payer: Self-pay | Admitting: Vascular Surgery

## 2023-09-20 ENCOUNTER — Ambulatory Visit: Payer: Self-pay | Admitting: *Deleted

## 2023-09-20 NOTE — Patient Outreach (Addendum)
  Care Coordination   Follow Up Visit Note   11/05/2023 updated note for 09/20/23 Name: Erik [name] MRN: [id number] DOB: 1946-02-22  Erik [name] is a 78 y.o. year old male who sees Erik [name], Erik Mellow, MD for primary care. I spoke with Erik [name], daughter of  [name] by phone today.  What matters to the patients health and wellness today?  Sleeping a lot, nightmares/night terrors, poor appetite, confusion  PMH neurocognitive deficits, iron deficiency anemia, microcytic anemia, vitamin d deficiency, protein calorie malnutrition Diabetes stable HgA1c 6.5 pending next lab in may 2025+   Goals Addressed             This Visit's Progress    RN CCM (IRON DEFICIENCY ANEMIA) EXPECTED OUTCOME: MONITOR, SELF-MANAGE AND REDUCE SYMPTOMS OF IRON DEFICIENCY ANEMIA   Not on track    Current Barriers:  Knowledge Deficits related to Iron Deficiency Anemia management Chronic Disease Management support and education needs related to Iron Deficiency Anemia Cognitive Deficits No Advanced Directives in place Interventions Today    Flowsheet Row Most Recent Value  Chronic Disease   Chronic disease during today's visit Diabetes, Hypertension (HTN), Atrial Fibrillation (AFib), Other  [sleeping alot, night terrors, poor appetite, confusion]  General Interventions   General Interventions Discussed/Reviewed General Interventions Reviewed, Sick Day Rules, Doctor Visits  Doctor Visits Discussed/Reviewed Doctor Visits Reviewed, PCP  PCP/Specialist Visits Compliance with follow-up visit  Exercise Interventions   Exercise Discussed/Reviewed Exercise Reviewed, Physical Activity  Education Interventions   Education Provided Provided Education, Provided Web-based Education  [Quality sleep, protein energy malnutrition, problems with thinking & memory, preventing iron deficiency anemia]  Provided Verbal Education On Nutrition, Mental Health/Coping with Illness, Sick Day Rules, When to see the  doctor, Medication, Other  Mental Health Interventions   Mental Health Discussed/Reviewed Mental Health Reviewed, Coping Strategies  Nutrition Interventions   Nutrition Discussed/Reviewed Nutrition Reviewed, Fluid intake, Portion sizes, Supplemental nutrition  Pharmacy Interventions   Pharmacy Dicussed/Reviewed Pharmacy Topics Reviewed, Affording Medications  Safety Interventions   Safety Discussed/Reviewed Safety Reviewed, Fall Risk, Home Safety  Home Safety Assistive Devices               SDOH assessments and interventions completed:  No     Care Coordination Interventions:  Yes, provided   Follow up plan:  Pending future scheduling &outreach from new [facility] primary care RN CM Please call if you have questions    Encounter Outcome:  Patient Visit Completed     Cala Bradford L. Noelle Penner, RN, BSN, CCM [place]  [facility], [facility] RN Care Manager Direct Dial: [phone]  Fax: [phone] Mailing Address: [address]  [place] [address] Website: [email or web address]

## 2023-09-20 NOTE — Patient Instructions (Addendum)
 Visit Information  Thank you for taking time to visit with me today. Please don't hesitate to contact me if I can be of assistance to you.   Iron deficiency anemia can lead to poor sleep that may increase more disturbing dreams Make sure to take the iron medicines, eat iron enriched foods, watch for any bleeding (skin, stool, urine+) keep MD aware of bleeding, increase sleeping, confusion Please read through the education provided    Following are the goals we discussed today:   Goals Addressed             This Visit's Progress    RN CCM (IRON DEFICIENCY ANEMIA) EXPECTED OUTCOME: MONITOR, SELF-MANAGE AND REDUCE SYMPTOMS OF IRON DEFICIENCY ANEMIA   Not on track    Current Barriers:  Knowledge Deficits related to Iron Deficiency Anemia management Chronic Disease Management support and education needs related to Iron Deficiency Anemia Cognitive Deficits No Advanced Directives in place Interventions Today    Flowsheet Row Most Recent Value  Chronic Disease   Chronic disease during today's visit Diabetes, Hypertension (HTN), Atrial Fibrillation (AFib), Other  [sleeping alot, night terrors, poor appetite, confusion]  General Interventions   General Interventions Discussed/Reviewed General Interventions Reviewed, Sick Day Rules, Doctor Visits  Doctor Visits Discussed/Reviewed Doctor Visits Reviewed, PCP  PCP/Specialist Visits Compliance with follow-up visit  Exercise Interventions   Exercise Discussed/Reviewed Exercise Reviewed, Physical Activity  Education Interventions   Education Provided Provided Education, Provided Web-based Education  [Quality sleep, protein energy malnutrition, problems with thinking & memory, preventing iron deficiency anemia]  Provided Verbal Education On Nutrition, Mental Health/Coping with Illness, Sick Day Rules, When to see the doctor, Medication, Other  Mental Health Interventions   Mental Health Discussed/Reviewed Mental Health Reviewed, Coping  Strategies  Nutrition Interventions   Nutrition Discussed/Reviewed Nutrition Reviewed, Fluid intake, Portion sizes, Supplemental nutrition  Pharmacy Interventions   Pharmacy Dicussed/Reviewed Pharmacy Topics Reviewed, Affording Medications  Safety Interventions   Safety Discussed/Reviewed Safety Reviewed, Fall Risk, Home Safety  Home Safety Assistive Devices               Our next appointment is by telephone  pending an outreach from future [place] Primary care RN CM at pending time. Please call this RN CM if have further questions  Please call the care guide team at [phone] if you need to cancel or reschedule your appointment.   If you are experiencing a Mental Health or Behavioral Health Crisis or need someone to talk to, please call the Suicide and Crisis Lifeline: 988 call the [place] National Suicide Prevention Lifeline: 530-775-6480 or TTY: 651-249-4225 TTY 701-735-0573) to talk to a trained counselor call 1-800-273-TALK (toll free, 24 hour hotline) call the [facility]: ([phone] call 911   Patient verbalizes understanding of instructions and care plan provided today and agrees to view in MyChart. Active MyChart status and patient understanding of how to access instructions and care plan via MyChart confirmed with patient.     The patient has been provided with contact information for the care management team and has been advised to call with any health related questions or concerns.   [place] Greb L. Noelle Penner, RN, BSN, CCM [place]  [facility], [facility] RN Care Manager Direct Dial: [phone]  Fax: [phone]

## 2023-10-01 ENCOUNTER — Encounter: Payer: Self-pay | Admitting: Internal Medicine

## 2023-10-01 ENCOUNTER — Ambulatory Visit (INDEPENDENT_AMBULATORY_CARE_PROVIDER_SITE_OTHER): Payer: Medicare Other | Admitting: Internal Medicine

## 2023-10-01 VITALS — BP 138/72 | HR 105 | Ht 73.0 in | Wt 172.0 lb

## 2023-10-01 DIAGNOSIS — I4891 Unspecified atrial fibrillation: Secondary | ICD-10-CM

## 2023-10-01 DIAGNOSIS — E119 Type 2 diabetes mellitus without complications: Secondary | ICD-10-CM

## 2023-10-01 DIAGNOSIS — G7080 Lambert-Eaton syndrome, unspecified: Secondary | ICD-10-CM

## 2023-10-01 DIAGNOSIS — E785 Hyperlipidemia, unspecified: Secondary | ICD-10-CM | POA: Diagnosis not present

## 2023-10-01 DIAGNOSIS — D508 Other iron deficiency anemias: Secondary | ICD-10-CM | POA: Diagnosis not present

## 2023-10-01 DIAGNOSIS — E1169 Type 2 diabetes mellitus with other specified complication: Secondary | ICD-10-CM

## 2023-10-01 MED ORDER — APIXABAN 5 MG PO TABS: 5.0000 mg | ORAL_TABLET | Freq: Two times a day (BID) | ORAL | 5 refills | Status: DC

## 2023-10-01 NOTE — Patient Instructions (Signed)
 It was a pleasure to see you today.  Thank you for giving [place] the opportunity to be involved in your care.  Below is a brief recap of your visit and next steps.  We will plan to see you again in 3 months.  Summary No medication changes today Repeat labs ordered Follow up in 3 months

## 2023-10-01 NOTE — Progress Notes (Signed)
 Established Patient Office Visit  Subjective   Patient ID: Erik [name], male    DOB: [birth year]  Age: 78 y.o. MRN: [id number]  Chief Complaint  Patient presents with   Care Management    Three month follow up    nightmares    Patient daughter concerned for terror dreams    Erik [name] returns to care today for routine follow-up.  He was last evaluated by me in December 2024.  No medication changes were made that time, repeat labs ordered, and 34-month follow-up arranged.  In the interim, he was evaluated by vascular surgery earlier this month (2/3).  There have otherwise been no acute interval events.  Erik [name] reports feeling well today.  He is asymptomatic and has no acute concerns to discuss.  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 Jennell Corner external and internal hemorrhoids, redundant left colon   ESOPHAGEAL DILATION N/A 11/12/2014   Procedure: ESOPHAGEAL DILATION;  Surgeon: West Bali, MD;  [facility];  Service: Endoscopy;  Laterality: N/A;   ESOPHAGOGASTRODUODENOSCOPY N/A 11/12/2014   Dr. Fields:mild non-erosive gastritis. negative H.pylori    None     Social History   Tobacco Use   Smoking status: Former    Current packs/day: 0.00    Average packs/day: 0.5 packs/day for 21.0 years (10.5 ttl pk-yrs)    Types: Cigarettes    Start date: 01/22/1963    Quit date: 08/11/1983    Years since quitting: 40.1    Passive exposure: Past   Smokeless tobacco: Never   Tobacco comments:    Verified by Daughter, Erik [name]  Vaping Use   Vaping status: Never Used  Substance Use Topics   Alcohol use: No    Alcohol/week: 0.0 standard drinks of  alcohol    Comment: remote past, history of ETOH abuse.    Drug use: No   Family History  Problem Relation Age of Onset   Dementia Mother    Crohn's disease Mother    Cancer Father    Sickle cell anemia Daughter    Stomach cancer Maternal Grandfather    Colon cancer Neg Hx    No Known Allergies  Review of Systems  Constitutional:  Negative for chills and fever.  HENT:  Negative for sore throat.   Respiratory:  Negative for cough and shortness of breath.   Cardiovascular:  Negative for chest pain, palpitations and leg swelling.  Gastrointestinal:  Negative for abdominal pain, blood in stool, constipation, diarrhea, nausea and vomiting.  Genitourinary:  Negative for dysuria and hematuria.  Musculoskeletal:  Negative for myalgias.  Skin:  Negative for itching and rash.  Neurological:  Negative for dizziness and headaches.  Psychiatric/Behavioral:  Negative for depression and suicidal ideas.      Objective:     BP 138/72   Pulse (!) 105   Ht 6\' 1"  (1.854 m)   Wt 172 lb (78 kg)   SpO2 93%   BMI 22.69 kg/m  BP Readings from Last 3 Encounters:  10/01/23 138/72  09/13/23 120/76  07/02/23 122/66   Physical Exam Vitals reviewed.  Constitutional:      General: He  is not in acute distress.    Appearance: Normal appearance. He is not ill-appearing.  HENT:     Head: Normocephalic and atraumatic.     Right Ear: External ear normal.     Left Ear: External ear normal.     Nose: Nose normal. No congestion or rhinorrhea.     Mouth/Throat:     Mouth: Mucous membranes are moist.     Pharynx: Oropharynx is clear.  Eyes:     General: No scleral icterus.    Extraocular Movements: Extraocular movements intact.     Conjunctiva/sclera: Conjunctivae normal.     Pupils: Pupils are equal, round, and reactive to light.  Cardiovascular:     Rate and Rhythm: Normal rate and regular rhythm.     Pulses: Normal pulses.     Heart sounds: Normal heart sounds. No murmur heard. Pulmonary:      Effort: Pulmonary effort is normal.     Breath sounds: Normal breath sounds. No wheezing, rhonchi or rales.  Abdominal:     General: Abdomen is flat. Bowel sounds are normal. There is no distension.     Palpations: Abdomen is soft.     Tenderness: There is no abdominal tenderness.  Musculoskeletal:        General: No swelling or deformity. Normal range of motion.     Cervical back: Normal range of motion.  Skin:    General: Skin is warm and dry.     Capillary Refill: Capillary refill takes less than 2 seconds.  Neurological:     General: No focal deficit present.     Mental Status: He is alert and oriented to person, place, and time.     Motor: No weakness.     Gait: Gait abnormal (Ambulates with cane).  Psychiatric:        Mood and Affect: Mood normal.        Behavior: Behavior normal.        Thought Content: Thought content normal.   Last CBC Lab Results  Component Value Date   WBC 9.6 07/02/2023   HGB 11.4 (L) 07/02/2023   HCT 37.0 (L) 07/02/2023   MCV 72 (L) 07/02/2023   MCH 22.3 (L) 07/02/2023   RDW 15.7 (H) 07/02/2023   PLT 229 07/02/2023   Last metabolic panel Lab Results  Component Value Date   GLUCOSE 104 (H) 07/02/2023   NA 141 07/02/2023   K 4.4 07/02/2023   CL 102 07/02/2023   CO2 23 07/02/2023   BUN 23 07/02/2023   CREATININE 1.53 (H) 07/02/2023   EGFR 46 (L) 07/02/2023   CALCIUM 10.0 07/02/2023   PROT 7.4 07/02/2023   ALBUMIN 4.5 07/02/2023   LABGLOB 2.9 07/02/2023   AGRATIO 1.5 11/06/2022   BILITOT 0.6 07/02/2023   ALKPHOS 82 07/02/2023   AST 37 07/02/2023   ALT 59 (H) 07/02/2023   ANIONGAP 10 01/25/2023   Last lipids Lab Results  Component Value Date   CHOL 105 07/02/2023   HDL 39 (L) 07/02/2023   LDLCALC 50 07/02/2023   TRIG 80 07/02/2023   CHOLHDL 2.7 07/02/2023   Last hemoglobin A1c Lab Results  Component Value Date   HGBA1C 6.5 (H) 07/02/2023   Last thyroid functions Lab Results  Component Value Date   TSH 1.130 07/02/2023    Last vitamin D Lab Results  Component Value Date   VD25OH 63.3 07/02/2023   Last vitamin B12 and Folate Lab Results  Component Value Date   VITAMINB12 747 07/02/2023  FOLATE 15.1 07/02/2023     Assessment & Plan:   Problem List Items Addressed This Visit       Atrial fibrillation with RVR (HCC)   Regular rate and rhythm detected on exam today.  He remains on Eliquis 5 mg twice daily.  Cardizem discontinued in the setting of orthostasis.  Eliquis refilled today.  Cardiology follow-up scheduled for next month.      Type 2 diabetes mellitus (HCC)   New diagnosis.  Meeting criteria with A1c 6.5 on labs from November 2024.  No medication changes were made today.  We discussed a dietary approach to diabetes control.  Repeat A1c at follow-up in 3 months.      LEMS (Lambert-Eaton myasthenic syndrome) (HCC)   Stable.  Followed by neurology at [facility].  Remains on amifampridine 15 mg 4 times daily.  Neurology follow-up scheduled for this spring.      Iron deficiency anemia - Primary   Previously documented history of iron deficiency anemia.  He has recently started oral iron supplementation.  Will update CBC and iron studies today.      Hyperlipidemia LDL goal <100   Lipid panel updated in November 2024 reflects excellent control with atorvastatin 40 mg daily.  No medication changes are indicated today.      Return in about 3 months (around 12/29/2023).   [name], MD

## 2023-10-02 NOTE — Assessment & Plan Note (Signed)
 New diagnosis.  Meeting criteria with A1c 6.5 on labs from November 2024.  No medication changes were made today.  We discussed a dietary approach to diabetes control.  Repeat A1c at follow-up in 3 months.

## 2023-10-02 NOTE — Assessment & Plan Note (Signed)
 Previously documented history of iron deficiency anemia.  He has recently started oral iron supplementation.  Will update CBC and iron studies today.

## 2023-10-02 NOTE — Assessment & Plan Note (Signed)
 Regular rate and rhythm detected on exam today.  He remains on Eliquis 5 mg twice daily.  Cardizem discontinued in the setting of orthostasis.  Eliquis refilled today.  Cardiology follow-up scheduled for next month.

## 2023-10-02 NOTE — Assessment & Plan Note (Signed)
 Lipid panel updated in November 2024 reflects excellent control with atorvastatin 40 mg daily.  No medication changes are indicated today.

## 2023-10-02 NOTE — Assessment & Plan Note (Addendum)
 Stable.  Followed by neurology at [facility].  Remains on amifampridine 15 mg 4 times daily.  Neurology follow-up scheduled for this spring.

## 2023-10-13 ENCOUNTER — Ambulatory Visit: Payer: Medicare Other | Admitting: Cardiology

## 2023-11-04 ENCOUNTER — Other Ambulatory Visit: Payer: Self-pay

## 2023-11-04 DIAGNOSIS — I739 Peripheral vascular disease, unspecified: Secondary | ICD-10-CM

## 2023-11-04 DIAGNOSIS — E782 Mixed hyperlipidemia: Secondary | ICD-10-CM

## 2023-11-04 MED ORDER — ATORVASTATIN CALCIUM 40 MG PO TABS
40.0000 mg | ORAL_TABLET | Freq: Every day | ORAL | 3 refills | Status: AC
Start: 2023-11-04 — End: ?

## 2023-11-18 ENCOUNTER — Encounter: Payer: Self-pay | Admitting: Cardiology

## 2023-11-18 ENCOUNTER — Ambulatory Visit: Attending: Cardiology | Admitting: Cardiology

## 2023-11-18 VITALS — BP 150/60 | HR 84 | Ht 72.0 in | Wt 165.0 lb

## 2023-11-18 DIAGNOSIS — D508 Other iron deficiency anemias: Secondary | ICD-10-CM | POA: Diagnosis not present

## 2023-11-18 DIAGNOSIS — E782 Mixed hyperlipidemia: Secondary | ICD-10-CM | POA: Diagnosis not present

## 2023-11-18 DIAGNOSIS — I951 Orthostatic hypotension: Secondary | ICD-10-CM | POA: Insufficient documentation

## 2023-11-18 DIAGNOSIS — I480 Paroxysmal atrial fibrillation: Secondary | ICD-10-CM | POA: Diagnosis not present

## 2023-11-18 DIAGNOSIS — E119 Type 2 diabetes mellitus without complications: Secondary | ICD-10-CM | POA: Diagnosis not present

## 2023-11-18 NOTE — Progress Notes (Signed)
 Clinical Summary Erik [name] is a 78 y.o.male seen today for follow up of the following medical problems.    1. Orthostatic hypotension -florinef stopped due to  issues with weight gain and edema. - has done well with just midodrine along with aggressive hydration and sodium intake     - recent admission with afib with RVR. Was discharged on diltiazem. Following discharge severe symptoms of orthostatic dizziness, he stopped taking the diltiazem - compliant with midodrine, taking 10mg  tid.   - some dizziness at times, about 1-2 times per month.  - compliant with meds including midodrine.  - working to stay hydrated, high sodium intake.    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 LE edema - controlled, has not needed prn lasix   4. PAD - followed by vacscular    5.PAF - new diagnosis during 01/2023 admission. Found down at home, altered by conscious. Rhabdo during admission.  - converted back to SR on IV cardizem - was discharged on diltaizem, he stopped taking due to severe worsenign of his orthostatic symptoms.   05/2023 14 day: no recurrent afib. Did have frequent PACs, short runs of SVT up to 15 beats. Frequent PVCs, 7.1% burden - rare palpitations.  - no bleeding on eliquis  6. 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     Amifampridine Phosphate (FIRDAPSE) 10 MG TABS Take 10 mg by mouth daily.     apixaban (ELIQUIS) 5 MG TABS tablet Take 1 tablet (5 mg total) by mouth 2 (two) times daily.  60 tablet 5   atorvastatin (LIPITOR) 40 MG tablet Take 1 tablet (40 mg total) by mouth daily. 90 tablet 3   furosemide (LASIX) 40 MG tablet TAKE 1 TABLET BY MOUTH ONCE DAILY ON MONDAY, WEDNESDAY AND FRIDAY. MAY TAKE ADDITIONAL AS NEEDED FOR SWELLING. 60 tablet 2   midodrine (PROAMATINE) 10 MG tablet Take 1 tablet (10 mg total) by mouth 3 (three) times daily with meals. 270 tablet 3   Zinc 50 MG TABS Take 50 mg by mouth daily.     No current facility-administered medications for this visit.     Past Surgical History:  Procedure Laterality Date   COLONOSCOPY N/A 11/12/2014   Dr. Jennell Corner external and internal hemorrhoids, redundant left colon   ESOPHAGEAL DILATION N/A 11/12/2014   Procedure: ESOPHAGEAL DILATION;  Surgeon: West Bali, MD;  [facility]: [facility];  Service: Endoscopy;  Laterality: N/A;   ESOPHAGOGASTRODUODENOSCOPY N/A 11/12/2014   Dr. Fields:mild non-erosive gastritis. negative H.pylori    None       No Known Allergies    Family History  Problem Relation Age of Onset   Dementia Mother    Crohn's disease Mother    Cancer Father    Sickle cell anemia Daughter    Stomach cancer Maternal Grandfather    Colon cancer Neg Hx      Social History Mr. Deiss reports  that he quit smoking about 40 years ago. His smoking use included cigarettes. He started smoking about 60 years ago. He has a 10.5 pack-year smoking history. He has been exposed to tobacco smoke. He has never used smokeless tobacco. Mr. Caswell reports no history of alcohol use.    Physical Examination Today's Vitals   11/18/23 1509 11/18/23 1531  BP: (!) 156/58 (!) 150/60  Pulse: 84   SpO2: 100%   Weight: 165 lb (74.8 kg)   Height: 6' (1.829 m)    Body mass index is 22.38 kg/m.  Gen: resting comfortably, no acute distress HEENT: no scleral icterus, pupils equal round and reactive, no palptable cervical adenopathy,  CV: RRR, no m/rg, no jvd Resp: Clear to auscultation bilaterally GI:  abdomen is soft, non-tender, non-distended, normal bowel sounds, no hepatosplenomegaly MSK: extremities are warm, no edema.  Skin: warm, no rash Neuro:  no focal deficits Psych: appropriate affect   Diagnostic Studies   06/2014 echo Study Conclusions  - Left ventricle: The cavity size was normal. Wall thickness was   normal. Systolic function was normal. The estimated ejection   fraction was in the range of 55% to 60%. Wall motion was normal;   there were no regional wall motion abnormalities. Doppler   parameters are consistent with abnormal left ventricular   relaxation (grade 1 diastolic dysfunction). - Aortic valve: Trileaflet; mildly thickened leaflets. - Mitral valve: Mildly thickened leaflets . There was trivial   regurgitation. - Right atrium: Central venous pressure (est): 3 mm Hg. - Atrial septum: No defect or patent foramen ovale was identified. - Tricuspid valve: There was mild regurgitation. - Pulmonary arteries: PA peak pressure: 34 mm Hg (S). - Pericardium, extracardiac: There was no pericardial effusion.  Impressions:  - Normal LV wall thickness with LVEF 55-60%, grade 1 diastolic   dysfunction. Mildly thickened aortic valve. Trivial mitral and   mild regurgitation. PASP 34 mmHg.     Jan 2018 [facility] [place] No aneurysm      01/2023 echo 1. Left ventricular ejection fraction, by estimation, is 50 to 55%. The  left ventricle has low normal function. The left ventricle has no regional  wall motion abnormalities. Left ventricular diastolic parameters were  normal.   2. Right ventricular systolic function is normal. The right ventricular  size is normal. There is moderately elevated pulmonary artery systolic  pressure.   3. The mitral valve is normal in structure. No evidence of mitral valve  regurgitation. No evidence of mitral stenosis.   4. The tricuspid valve is abnormal.   5. The aortic valve is tricuspid. Aortic valve regurgitation is not  visualized. No  aortic stenosis is present.   6. The inferior vena cava is dilated in size with <50% respiratory  variability, suggesting right atrial pressure of 15 mmHg.       Assessment and Plan   1. Orthostatiic hypotension -- florinef stopped due to significant edema and weight gain - has done well on midodrine with aggressive hydration - continue current meds   2. PAF - new diagnosis during recent admission - he stopped diltiazem as it made his orthostatic hypotension/dizziness worst. - recent monitor showed no recurrent afib, does have some ectopy and SVT that is asymptomatic. If were to progress would need to consider anthiarrhythmic, don't think he would tolerate av nodal agent with his orthostatic hypotension  3. HLD - at goal, continue current meds     Antoine Poche, M.D

## 2023-11-18 NOTE — Patient Instructions (Signed)
 Medication Instructions:  Your physician has recommended you make the following change in your medication:   -Stop Diltiazem   *If you need a refill on your cardiac medications before your next appointment, please call your pharmacy*  Lab Work: None If you have labs (blood work) drawn today and your tests are completely normal, you will receive your results only by: MyChart Message (if you have MyChart) OR A paper copy in the mail If you have any lab test that is abnormal or we need to change your treatment, we will call you to review the results.  Testing/Procedures: None  Follow-Up: At [facility], you and your health needs are our priority.  As part of our continuing mission to provide you with exceptional heart care, our providers are all part of one team.  This team includes your primary Cardiologist (physician) and Advanced Practice Providers or APPs (Physician Assistants and Nurse Practitioners) who all work together to provide you with the care you need, when you need it.  Your next appointment:   6 month(s)  Provider:   You may see Dina Rich, MD or one of the following Advanced Practice Providers on your designated Care Team:   Randall An, PA-C  [place], [place] Jacolyn Reedy, [place]     We recommend signing up for the patient portal called "MyChart".  Sign up information is provided on this After Visit Summary.  MyChart is used to connect with patients for Virtual Visits (Telemedicine).  Patients are able to view lab/test results, encounter notes, upcoming appointments, etc.  Non-urgent messages can be sent to your provider as well.   To learn more about what you can do with MyChart, go to [email or web address].   Other Instructions

## 2023-11-19 LAB — CBC WITH DIFFERENTIAL/PLATELET
Basophils Absolute: 0.1 10*3/uL (ref 0.0–0.2)
Basos: 1 %
EOS (ABSOLUTE): 0 10*3/uL (ref 0.0–0.4)
Eos: 0 %
Hematocrit: 38.1 % (ref 37.5–51.0)
Hemoglobin: 11.9 g/dL — ABNORMAL LOW (ref 13.0–17.7)
Immature Grans (Abs): 0 10*3/uL (ref 0.0–0.1)
Immature Granulocytes: 0 %
Lymphocytes Absolute: 2 10*3/uL (ref 0.7–3.1)
Lymphs: 24 %
MCH: 22.6 pg — ABNORMAL LOW (ref 26.6–33.0)
MCHC: 31.2 g/dL — ABNORMAL LOW (ref 31.5–35.7)
MCV: 72 fL — ABNORMAL LOW (ref 79–97)
Monocytes Absolute: 0.6 10*3/uL (ref 0.1–0.9)
Monocytes: 7 %
Neutrophils Absolute: 5.7 10*3/uL (ref 1.4–7.0)
Neutrophils: 68 %
Platelets: 272 10*3/uL (ref 150–450)
RBC: 5.26 x10E6/uL (ref 4.14–5.80)
RDW: 15.2 % (ref 11.6–15.4)
WBC: 8.5 10*3/uL (ref 3.4–10.8)

## 2023-11-19 LAB — IRON,TIBC AND FERRITIN PANEL
Ferritin: 386 ng/mL (ref 30–400)
Iron Saturation: 34 % (ref 15–55)
Iron: 102 ug/dL (ref 38–169)
Total Iron Binding Capacity: 298 ug/dL (ref 250–450)
UIBC: 196 ug/dL (ref 111–343)

## 2023-11-19 LAB — BASIC METABOLIC PANEL WITH GFR
BUN/Creatinine Ratio: 14 (ref 10–24)
BUN: 21 mg/dL (ref 8–27)
CO2: 23 mmol/L (ref 20–29)
Calcium: 10 mg/dL (ref 8.6–10.2)
Chloride: 98 mmol/L (ref 96–106)
Creatinine, Ser: 1.55 mg/dL — ABNORMAL HIGH (ref 0.76–1.27)
Glucose: 111 mg/dL — ABNORMAL HIGH (ref 70–99)
Potassium: 4.2 mmol/L (ref 3.5–5.2)
Sodium: 142 mmol/L (ref 134–144)
eGFR: 46 mL/min/{1.73_m2} — ABNORMAL LOW (ref >59)

## 2023-12-07 DIAGNOSIS — G7080 Lambert-Eaton syndrome, unspecified: Secondary | ICD-10-CM | POA: Diagnosis not present

## 2023-12-28 ENCOUNTER — Encounter (INDEPENDENT_AMBULATORY_CARE_PROVIDER_SITE_OTHER): Payer: Self-pay

## 2024-01-05 ENCOUNTER — Ambulatory Visit (INDEPENDENT_AMBULATORY_CARE_PROVIDER_SITE_OTHER): Payer: Medicare Other | Admitting: Internal Medicine

## 2024-01-05 ENCOUNTER — Encounter: Payer: Self-pay | Admitting: Internal Medicine

## 2024-01-05 VITALS — BP 137/64 | HR 97 | Ht 72.0 in | Wt 170.6 lb

## 2024-01-05 DIAGNOSIS — E1159 Type 2 diabetes mellitus with other circulatory complications: Secondary | ICD-10-CM

## 2024-01-05 DIAGNOSIS — I951 Orthostatic hypotension: Secondary | ICD-10-CM | POA: Diagnosis not present

## 2024-01-05 DIAGNOSIS — G7080 Lambert-Eaton syndrome, unspecified: Secondary | ICD-10-CM | POA: Diagnosis not present

## 2024-01-05 DIAGNOSIS — E119 Type 2 diabetes mellitus without complications: Secondary | ICD-10-CM | POA: Diagnosis not present

## 2024-01-05 DIAGNOSIS — N1831 Chronic kidney disease, stage 3a: Secondary | ICD-10-CM | POA: Insufficient documentation

## 2024-01-05 DIAGNOSIS — I480 Paroxysmal atrial fibrillation: Secondary | ICD-10-CM | POA: Diagnosis not present

## 2024-01-05 DIAGNOSIS — D508 Other iron deficiency anemias: Secondary | ICD-10-CM

## 2024-01-05 NOTE — Progress Notes (Signed)
 Established Patient Office Visit  Subjective   Patient ID: Erik [name], male    DOB: 1945-10-29  Age: 78 y.o. MRN: [id number]  Chief Complaint  Patient presents with   Care Management    Three month follow up    Erik [name] returns to care today for routine follow-up.  He was last evaluated by me on 2/21 no medication changes were made at that time and 61-month follow-up was arranged.  In the interim he has been seen by cardiology and neurology for follow-up.  There have otherwise been no acute interval events.  Today he reports feeling well and has no acute concerns to discuss.  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 Lugene [name] external and internal hemorrhoids, redundant left colon   ESOPHAGEAL DILATION N/A 11/12/2014   Procedure: ESOPHAGEAL DILATION;  Surgeon: Erik Jubilee, MD;  [facility];  Service: Endoscopy;  Laterality: N/A;   ESOPHAGOGASTRODUODENOSCOPY N/A 11/12/2014   Erik [name]:mild non-erosive gastritis. negative H.pylori    None     Social History   Tobacco Use   Smoking status: Former    Current packs/day: 0.00    Average packs/day: 0.5 packs/day for 21.0 years (10.5 ttl pk-yrs)    Types: Cigarettes    Start date: 01/22/1963    Quit date: 08/11/1983    Years since quitting: 40.4    Passive exposure: Past   Smokeless tobacco: Never   Tobacco comments:    Verified by Daughter, Erik [name]  Vaping Use   Vaping status: Never Used  Substance Use Topics   Alcohol use: No    Alcohol/week: 0.0 standard drinks of alcohol    Comment: remote past, history of ETOH abuse.    Drug use: No   Family History  Problem Relation Age of Onset    Dementia Mother    Crohn's disease Mother    Cancer Father    Sickle cell anemia Daughter    Stomach cancer Maternal Grandfather    Colon cancer Neg Hx    No Known Allergies  Review of Systems  Constitutional:  Negative for chills and fever.  HENT:  Negative for sore throat.   Respiratory:  Negative for cough and shortness of breath.   Cardiovascular:  Negative for chest pain, palpitations and leg swelling.  Gastrointestinal:  Negative for abdominal pain, blood in stool, constipation, diarrhea, nausea and vomiting.  Genitourinary:  Negative for dysuria and hematuria.  Musculoskeletal:  Negative for myalgias.  Skin:  Negative for itching and rash.  Neurological:  Negative for dizziness and headaches.  Psychiatric/Behavioral:  Negative for depression and suicidal ideas.      Objective:     BP 137/64   Pulse 97   Ht 6' (1.829 m)   Wt 170 lb 9.6 oz (77.4 kg)   SpO2 94%   BMI 23.14 kg/m  BP Readings from Last 3 Encounters:  01/05/24 137/64  11/18/23 (!) 150/60  10/01/23 138/72   Physical Exam Vitals reviewed.  Constitutional:      General: He is not in acute distress.    Appearance: Normal appearance. He is not ill-appearing.  HENT:  Head: Normocephalic and atraumatic.     Right Ear: External ear normal.     Left Ear: External ear normal.     Nose: Nose normal. No congestion or rhinorrhea.     Mouth/Throat:     Mouth: Mucous membranes are moist.     Pharynx: Oropharynx is clear.  Eyes:     General: No scleral icterus.    Extraocular Movements: Extraocular movements intact.     Conjunctiva/sclera: Conjunctivae normal.     Pupils: Pupils are equal, round, and reactive to light.  Cardiovascular:     Rate and Rhythm: Normal rate and regular rhythm.     Pulses: Normal pulses.     Heart sounds: Normal heart sounds. No murmur heard. Pulmonary:     Effort: Pulmonary effort is normal.     Breath sounds: Normal breath sounds. No wheezing, rhonchi or rales.   Abdominal:     General: Abdomen is flat. Bowel sounds are normal. There is no distension.     Palpations: Abdomen is soft.     Tenderness: There is no abdominal tenderness.  Musculoskeletal:        General: No swelling or deformity. Normal range of motion.     Cervical back: Normal range of motion.  Skin:    General: Skin is warm and dry.     Capillary Refill: Capillary refill takes less than 2 seconds.  Neurological:     General: No focal deficit present.     Mental Status: He is alert and oriented to person, place, and time.     Motor: No weakness.     Gait: Gait abnormal (Ambulates with cane).  Psychiatric:        Mood and Affect: Mood normal.        Behavior: Behavior normal.        Thought Content: Thought content normal.   Last CBC Lab Results  Component Value Date   WBC 8.5 11/18/2023   HGB 11.9 (L) 11/18/2023   HCT 38.1 11/18/2023   MCV 72 (L) 11/18/2023   MCH 22.6 (L) 11/18/2023   RDW 15.2 11/18/2023   PLT 272 11/18/2023   Last metabolic panel Lab Results  Component Value Date   GLUCOSE 111 (H) 11/18/2023   NA 142 11/18/2023   K 4.2 11/18/2023   CL 98 11/18/2023   CO2 23 11/18/2023   BUN 21 11/18/2023   CREATININE 1.55 (H) 11/18/2023   EGFR 46 (L) 11/18/2023   CALCIUM  10.0 11/18/2023   PROT 7.4 07/02/2023   ALBUMIN 4.5 07/02/2023   LABGLOB 2.9 07/02/2023   AGRATIO 1.5 11/06/2022   BILITOT 0.6 07/02/2023   ALKPHOS 82 07/02/2023   AST 37 07/02/2023   ALT 59 (H) 07/02/2023   ANIONGAP 10 01/25/2023   Last lipids Lab Results  Component Value Date   CHOL 105 07/02/2023   HDL 39 (L) 07/02/2023   LDLCALC 50 07/02/2023   TRIG 80 07/02/2023   CHOLHDL 2.7 07/02/2023   Last hemoglobin A1c Lab Results  Component Value Date   HGBA1C 6.5 (H) 07/02/2023   Last thyroid  functions Lab Results  Component Value Date   TSH 1.130 07/02/2023   Last vitamin D  Lab Results  Component Value Date   VD25OH 63.3 07/02/2023   Last vitamin B12 and Folate Lab  Results  Component Value Date   VITAMINB12 747 07/02/2023   FOLATE 15.1 07/02/2023     Assessment & Plan:   Problem List Items Addressed This Visit  Orthostatic hypotension   Remains stable and asymptomatic on midodrine .      Paroxysmal atrial fibrillation (HCC)   Regular rate and rhythm detected on exam again today.  He remains on Eliquis  5 mg twice daily.  Recently seen by cardiology for follow-up.      Type 2 diabetes mellitus (HCC) - Primary   Recent diagnosis, meeting criteria with A1c 6.5 on labs from November 2024.  Not on medication currently and has focused on dietary control.  Repeat A1c and urine microalbumin/creatinine ratio ordered today.      LEMS (Lambert-Eaton myasthenic syndrome) (HCC)   Recently seen by neurology at [facility] for follow-up.  He remains on amifampridine .      Chronic kidney disease, stage 3a (HCC)   CKD 3A based on recent labs.  Not on ACE/ARB currently in the setting of orthostatic hypotension.  Checking urine microalbumin/creatinine ratio today.      Iron deficiency anemia   Hgb stable.  Iron studies not consistent with deficiency on labs from last month.       Return in about 6 months (around 07/07/2024).    Tobi Fortes, MD

## 2024-01-05 NOTE — Assessment & Plan Note (Signed)
 Hgb stable.  Iron studies not consistent with deficiency on labs from last month.

## 2024-01-05 NOTE — Assessment & Plan Note (Signed)
 CKD 3A based on recent labs.  Not on ACE/ARB currently in the setting of orthostatic hypotension.  Checking urine microalbumin/creatinine ratio today.

## 2024-01-05 NOTE — Patient Instructions (Signed)
 It was a pleasure to see you today.  Thank you for giving us  the opportunity to be involved in your care.  Below is a brief recap of your visit and next steps.  We will plan to see you again in 6 months.  Summary No medication changes today Repeat A1c and urine study Follow up in 6 months

## 2024-01-05 NOTE — Assessment & Plan Note (Signed)
 Recently seen by neurology at [facility] for follow-up.  He remains on amifampridine .

## 2024-01-05 NOTE — Assessment & Plan Note (Signed)
 Recent diagnosis, meeting criteria with A1c 6.5 on labs from November 2024.  Not on medication currently and has focused on dietary control.  Repeat A1c and urine microalbumin/creatinine ratio ordered today.

## 2024-01-05 NOTE — Assessment & Plan Note (Signed)
 Remains stable and asymptomatic on midodrine .

## 2024-01-05 NOTE — Assessment & Plan Note (Signed)
 Regular rate and rhythm detected on exam again today.  He remains on Eliquis  5 mg twice daily.  Recently seen by cardiology for follow-up.

## 2024-01-06 ENCOUNTER — Ambulatory Visit: Payer: Self-pay | Admitting: Internal Medicine

## 2024-01-06 LAB — BAYER DCA HB A1C WAIVED: HB A1C (BAYER DCA - WAIVED): 6.4 % — ABNORMAL HIGH (ref 4.8–5.6)

## 2024-01-07 LAB — MICROALBUMIN / CREATININE URINE RATIO
Creatinine, Urine: 191.8 mg/dL
Microalb/Creat Ratio: 10 mg/g{creat} (ref 0–29)
Microalbumin, Urine: 18.9 ug/mL

## 2024-01-21 ENCOUNTER — Other Ambulatory Visit: Payer: Self-pay

## 2024-01-21 DIAGNOSIS — I951 Orthostatic hypotension: Secondary | ICD-10-CM

## 2024-01-21 MED ORDER — MIDODRINE HCL 10 MG PO TABS
10.0000 mg | ORAL_TABLET | Freq: Three times a day (TID) | ORAL | 3 refills | Status: AC
Start: 2024-01-21 — End: ?

## 2024-03-13 ENCOUNTER — Other Ambulatory Visit: Payer: Self-pay | Admitting: Internal Medicine

## 2024-03-13 ENCOUNTER — Encounter (INDEPENDENT_AMBULATORY_CARE_PROVIDER_SITE_OTHER): Payer: Medicare Other

## 2024-03-13 ENCOUNTER — Ambulatory Visit (INDEPENDENT_AMBULATORY_CARE_PROVIDER_SITE_OTHER): Payer: Medicare Other | Admitting: Vascular Surgery

## 2024-03-15 ENCOUNTER — Other Ambulatory Visit: Payer: Self-pay | Admitting: Internal Medicine

## 2024-03-17 ENCOUNTER — Other Ambulatory Visit (INDEPENDENT_AMBULATORY_CARE_PROVIDER_SITE_OTHER): Payer: Self-pay | Admitting: Vascular Surgery

## 2024-03-17 DIAGNOSIS — I739 Peripheral vascular disease, unspecified: Secondary | ICD-10-CM

## 2024-03-19 NOTE — Progress Notes (Signed)
 MRN : [id number]  Erik [name] is a 78 y.o. ([birth year]) male who presents with chief complaint of check circulation.  History of Present Illness:   The patient returns to the office for followup and review of the noninvasive studies.    There have been no interval changes in lower extremity symptoms. No interval shortening of the patient's claudication distance or development of rest pain symptoms. No new ulcers or wounds have occurred since the last visit.   He continues to have lower extremity edema but it is improved from his previous follow-up.   There have been no significant changes to the patient's overall health care.   The patient denies amaurosis fugax or recent TIA symptoms. There are no documented recent neurological changes noted. There is no history of DVT, PE or superficial thrombophlebitis. The patient denies recent episodes of angina or shortness of breath.    ABI Rt=1.04 and Lt=0.74  (previous ABI's Rt=0.98 and Lt=0.83 ).   Previous duplex ultrasound of the right lower extremity shows triphasic waveforms with monophasic in the left.  However the study was somewhat limited due to the severe skin changes present in the lower extremities.  No outpatient medications have been marked as taking for the 03/20/24 encounter (Appointment) with Jama, Cordella MATSU, MD.    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 Sonia external and internal hemorrhoids, redundant left colon   ESOPHAGEAL DILATION N/A 11/12/2014   Procedure: ESOPHAGEAL DILATION;  Surgeon: Margo LITTIE Haddock, MD;  Location:  [facility];  Service: Endoscopy;  Laterality: N/A;   ESOPHAGOGASTRODUODENOSCOPY N/A 11/12/2014   Dr. Fields:mild non-erosive gastritis. negative H.pylori    None      Social History Social History   Tobacco Use   Smoking status: Former    Current packs/day: 0.00    Average packs/day: 0.5 packs/day for 21.0 years (10.5 ttl pk-yrs)    Types: Cigarettes    Start date: 01/22/1963    Quit date: 08/11/1983    Years since quitting: 40.6    Passive exposure: Past   Smokeless tobacco: Never   Tobacco comments:    Verified by Daughter, [name]  Vaping Use   Vaping status: Never Used  Substance Use Topics   Alcohol use: No    Alcohol/week: 0.0 standard drinks of alcohol    Comment: remote past, history of ETOH abuse.    Drug use: No    Family History Family History  Problem Relation Age of Onset   Dementia Mother    Crohn's disease Mother    Cancer Father    Sickle cell anemia Daughter    Stomach cancer Maternal Grandfather    Colon cancer Neg Hx     No Known Allergies   REVIEW  OF SYSTEMS (Negative unless checked)  Constitutional: [] Weight loss  [] Fever  [] Chills Cardiac: [] Chest pain   [] Chest pressure   [] Palpitations   [] Shortness of breath when laying flat   [] Shortness of breath with exertion. Vascular:  [x] Pain in legs with walking   [] Pain in legs at rest  [] History of DVT   [] Phlebitis   [] Swelling in legs   [] Varicose veins   [] Non-healing ulcers Pulmonary:   [] Uses home oxygen    [] Productive cough   [] Hemoptysis   [] Wheeze  [] COPD   [] Asthma Neurologic:  [] Dizziness   [] Seizures   [] History of stroke   [] History of TIA  [] Aphasia   [] Vissual changes   [] Weakness or numbness in arm   [] Weakness or numbness in leg Musculoskeletal:   [] Joint swelling   [] Joint pain   [] Low back pain Hematologic:  [] Easy bruising  [] Easy bleeding   [] Hypercoagulable state   [] Anemic Gastrointestinal:  [] Diarrhea   [] Vomiting  [] Gastroesophageal reflux/heartburn   [] Difficulty  swallowing. Genitourinary:  [] Chronic kidney disease   [] Difficult urination  [] Frequent urination   [] Blood in urine Skin:  [] Rashes   [] Ulcers  Psychological:  [] History of anxiety   []  History of major depression.  Physical Examination  There were no vitals filed for this visit. There is no height or weight on file to calculate BMI. Gen: WD/WN, NAD Head: Bishop/AT, No temporalis wasting.  Ear/Nose/Throat: Hearing grossly intact, nares w/o erythema or drainage Eyes: PER, EOMI, sclera nonicteric.  Neck: Supple, no masses.  No bruit or JVD.  Pulmonary:  Good air movement, no audible wheezing, no use of accessory muscles.  Cardiac: RRR, normal S1, S2, no Murmurs. Vascular:  mild trophic changes, no open wounds Vessel Right Left  Radial Palpable Palpable  PT Not Palpable Not Palpable  DP Not Palpable Not Palpable  Gastrointestinal: soft, non-distended. No guarding/no peritoneal signs.  Musculoskeletal: M/S 5/5 throughout.  No visible deformity.  Neurologic: CN 2-12 intact. Pain and light touch intact in extremities.  Symmetrical.  Speech is fluent. Motor exam as listed above. Psychiatric: Judgment intact, Mood & affect appropriate for pt's clinical situation. Dermatologic: No rashes or ulcers noted.  No changes consistent with cellulitis.   CBC Lab Results  Component Value Date   WBC 8.5 11/18/2023   HGB 11.9 (L) 11/18/2023   HCT 38.1 11/18/2023   MCV 72 (L) 11/18/2023   PLT 272 11/18/2023    BMET    Component Value Date/Time   NA 142 11/18/2023 1541   K 4.2 11/18/2023 1541   CL 98 11/18/2023 1541   CO2 23 11/18/2023 1541   GLUCOSE 111 (H) 11/18/2023 1541   GLUCOSE 93 01/25/2023 0800   BUN 21 11/18/2023 1541   CREATININE 1.55 (H) 11/18/2023 1541   CREATININE 1.22 (H) 08/09/2019 0809   CALCIUM  10.0 11/18/2023 1541   GFRNONAA >60 01/25/2023 0800   GFRAA >60 12/03/2017 1148   CrCl cannot be calculated (Patient's most recent lab result is older than the maximum 21 days  allowed.).  COAG No results found for: INR, PROTIME  Radiology No results found.   Assessment/Plan 1. PAD (peripheral artery disease) (HCC) (Primary)  Recommend:  The patient has evidence of atherosclerosis of the lower extremities with claudication.  The patient does not voice lifestyle limiting changes at this point in time.  Noninvasive studies do not suggest clinically significant change.  No invasive studies, angiography or surgery at this time The patient should continue walking and begin a more formal exercise program.  The patient should continue antiplatelet therapy and aggressive treatment of the lipid abnormalities  No changes in the patient's medications at this time  Continued surveillance is indicated as atherosclerosis is likely to progress with time.    The patient will continue follow up with noninvasive studies as ordered.   - VAS US  ABI WITH/WO TBI; Future  2. Paroxysmal atrial fibrillation (HCC) Continue antiarrhythmia medications as already ordered, these medications have been reviewed and there are no changes at this time.  Continue anticoagulation as ordered by Cardiology Service  3. Type 2 diabetes mellitus with other circulatory complication, without long-term current use of insulin (HCC) Continue hypoglycemic medications as already ordered, these medications have been reviewed and there are no changes at this time.  Hgb A1C to be monitored as already arranged by primary service  4. Hyperlipidemia LDL goal <100 Continue statin as ordered and reviewed, no changes at this time    Cordella Shawl, MD  03/19/2024 4:33 PM

## 2024-03-20 ENCOUNTER — Encounter (INDEPENDENT_AMBULATORY_CARE_PROVIDER_SITE_OTHER): Payer: Self-pay | Admitting: Vascular Surgery

## 2024-03-20 ENCOUNTER — Ambulatory Visit (INDEPENDENT_AMBULATORY_CARE_PROVIDER_SITE_OTHER)

## 2024-03-20 ENCOUNTER — Ambulatory Visit (INDEPENDENT_AMBULATORY_CARE_PROVIDER_SITE_OTHER): Admitting: Vascular Surgery

## 2024-03-20 VITALS — BP 144/83 | HR 73 | Ht 72.0 in | Wt 167.0 lb

## 2024-03-20 DIAGNOSIS — I480 Paroxysmal atrial fibrillation: Secondary | ICD-10-CM

## 2024-03-20 DIAGNOSIS — I739 Peripheral vascular disease, unspecified: Secondary | ICD-10-CM

## 2024-03-20 DIAGNOSIS — E1159 Type 2 diabetes mellitus with other circulatory complications: Secondary | ICD-10-CM | POA: Diagnosis not present

## 2024-03-20 DIAGNOSIS — E785 Hyperlipidemia, unspecified: Secondary | ICD-10-CM

## 2024-03-20 LAB — VAS US ABI WITH/WO TBI
Left ABI: 1.03
Right ABI: 1.07

## 2024-06-08 ENCOUNTER — Other Ambulatory Visit: Payer: Self-pay

## 2024-06-13 ENCOUNTER — Encounter: Payer: Self-pay | Admitting: Cardiology

## 2024-06-13 ENCOUNTER — Ambulatory Visit: Attending: Cardiology | Admitting: Cardiology

## 2024-06-13 VITALS — BP 150/65 | HR 100 | Ht 72.0 in | Wt 168.0 lb

## 2024-06-13 DIAGNOSIS — I480 Paroxysmal atrial fibrillation: Secondary | ICD-10-CM | POA: Diagnosis not present

## 2024-06-13 DIAGNOSIS — I951 Orthostatic hypotension: Secondary | ICD-10-CM | POA: Diagnosis not present

## 2024-06-13 DIAGNOSIS — E782 Mixed hyperlipidemia: Secondary | ICD-10-CM | POA: Insufficient documentation

## 2024-06-13 NOTE — Progress Notes (Signed)
 Clinical Summary Erik [name] is a 78 y.o.male seen today for follow up of the following medical problems.    1. Orthostatic hypotension -florinef  stopped due to  issues with weight gain and edema. - has done well with just midodrine  along with aggressive hydration and sodium intake   - some lightheadedness/dizziness at times with getting up quickly - no presyncope, no syncope - working to stay hydrated. Compliant with midodrine .     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 LE edema - controlled, has not needed prn lasix    4. PAD - followed by vacscular     5.PAF - new diagnosis during 01/2023 admission. Found down at home, altered by conscious. Rhabdo during admission.  - converted back to SR on IV cardizem  - was discharged on diltaizem, he stopped taking due to severe worsenign of his orthostatic symptoms.    05/2023 14 day: no recurrent afib. Did have frequent PACs, short runs of SVT up to 15 beats. Frequent PVCs, 7.1% burden  - some palpitations with standing at times.  - no bleeding on eliquis .    6. 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     Amifampridine  Phosphate (FIRDAPSE ) 10 MG TABS Take 10 mg by mouth daily.     atorvastatin  (LIPITOR) 40 MG tablet Take 1 tablet (40 mg total) by mouth daily. 90 tablet 3   ELIQUIS  5 MG TABS tablet Take 1 tablet by mouth twice daily 180 tablet 0   furosemide  (LASIX ) 40 MG tablet TAKE 1 TABLET BY MOUTH ONCE DAILY ON MONDAY, WEDNESDAY AND FRIDAY. MAY  TAKE ADDITIONAL AS NEEDED FOR SWELLING. 60 tablet 2   midodrine  (PROAMATINE ) 10 MG tablet Take 1 tablet (10 mg total) by mouth 3 (three) times daily with meals. 270 tablet 3   Zinc 50 MG TABS Take 50 mg by mouth daily.     No current facility-administered medications for this visit.     Past Surgical History:  Procedure Laterality Date   COLONOSCOPY N/A 11/12/2014   Dr. Sonia external and internal hemorrhoids, redundant left colon   ESOPHAGEAL DILATION N/A 11/12/2014   Procedure: ESOPHAGEAL DILATION;  Surgeon: Margo LITTIE Haddock, MD;  Location: [facility];  Service: Endoscopy;  Laterality: N/A;   ESOPHAGOGASTRODUODENOSCOPY N/A 11/12/2014   Dr. Fields:mild non-erosive gastritis. negative H.pylori    None       No Known Allergies    Family History  Problem Relation Age of Onset   Dementia Mother    Crohn's disease Mother    Cancer Father    Sickle cell anemia Daughter    Stomach cancer Maternal Grandfather    Colon cancer Neg Hx      Social History Erik [name] reports that he quit smoking about 40 years ago. His smoking use included cigarettes. He started smoking about 61 years ago. He has a 10.5 pack-year smoking history. He has been exposed to tobacco smoke. He  has never used smokeless tobacco. Erik [name] reports no history of alcohol use.    Physical Examination Vitals:   06/13/24 1422 06/13/24 1448  BP: (!) 176/70 (!) 150/65  Pulse: 100   SpO2: 100%    Filed Weights   06/13/24 1422  Weight: 168 lb (76.2 kg)    Gen: resting comfortably, no acute distress HEENT: no scleral icterus, pupils equal round and reactive, no palptable cervical adenopathy,  CV: RRR, no m/rg, no jvd Resp: Clear to auscultation bilaterally GI: abdomen is soft, non-tender, non-distended, normal bowel sounds, no hepatosplenomegaly MSK: extremities are warm, no edema.  Skin: warm, no rash Neuro:  no focal deficits Psych: appropriate affect   Diagnostic Studies  06/2014  echo Study Conclusions  - Left ventricle: The cavity size was normal. Wall thickness was   normal. Systolic function was normal. The estimated ejection   fraction was in the range of 55% to 60%. Wall motion was normal;   there were no regional wall motion abnormalities. Doppler   parameters are consistent with abnormal left ventricular   relaxation (grade 1 diastolic dysfunction). - Aortic valve: Trileaflet; mildly thickened leaflets. - Mitral valve: Mildly thickened leaflets . There was trivial   regurgitation. - Right atrium: Central venous pressure (est): 3 mm Hg. - Atrial septum: No defect or patent foramen ovale was identified. - Tricuspid valve: There was mild regurgitation. - Pulmonary arteries: PA peak pressure: 34 mm Hg (S). - Pericardium, extracardiac: There was no pericardial effusion.  Impressions:  - Normal LV wall thickness with LVEF 55-60%, grade 1 diastolic   dysfunction. Mildly thickened aortic valve. Trivial mitral and   mild regurgitation. PASP 34 mmHg.     Jan 2018 AAA US  No aneurysm      01/2023 echo 1. Left ventricular ejection fraction, by estimation, is 50 to 55%. The  left ventricle has low normal function. The left ventricle has no regional  wall motion abnormalities. Left ventricular diastolic parameters were  normal.   2. Right ventricular systolic function is normal. The right ventricular  size is normal. There is moderately elevated pulmonary artery systolic  pressure.   3. The mitral valve is normal in structure. No evidence of mitral valve  regurgitation. No evidence of mitral stenosis.   4. The tricuspid valve is abnormal.   5. The aortic valve is tricuspid. Aortic valve regurgitation is not  visualized. No aortic stenosis is present.   6. The inferior vena cava is dilated in size with <50% respiratory  variability, suggesting right atrial pressure of 15 mmHg.    Assessment and Plan    1. Orthostatic hypotension -- florinef  stopped due  to significant edema and weight gain - has done well on midodrine  with aggressive hydration. Needs new Rx for compression stockings - overall doing well, continue current therapy. Accept high systemic bp's given severe history of orthostatic hypotension   2. PAF/acquired thrombophilia - he stopped diltiazem  as it made his orthostatic hypotension/dizziness worst. - recent monitor showed no recurrent afib, does have some ectopy and SVT that is asymptomatic. If were to progress would need to consider anthiarrhythmic, don't think he would tolerate av nodal agent with his orthostatic hypotension - no symptoms, continue current meds including eliquis  for stroke prevention   3. HLD - has been at goal, f/u upcoming pcp labs    Dorn PHEBE Ross, M.D.

## 2024-06-13 NOTE — Patient Instructions (Signed)

## 2024-06-15 ENCOUNTER — Ambulatory Visit: Admitting: Cardiology

## 2024-07-12 ENCOUNTER — Ambulatory Visit

## 2024-08-01 ENCOUNTER — Ambulatory Visit

## 2024-08-01 VITALS — BP 126/65 | HR 108 | Resp 16 | Ht 72.0 in | Wt 171.0 lb

## 2024-08-01 DIAGNOSIS — E1159 Type 2 diabetes mellitus with other circulatory complications: Secondary | ICD-10-CM

## 2024-08-01 DIAGNOSIS — E119 Type 2 diabetes mellitus without complications: Secondary | ICD-10-CM

## 2024-08-01 DIAGNOSIS — E785 Hyperlipidemia, unspecified: Secondary | ICD-10-CM | POA: Diagnosis not present

## 2024-08-01 DIAGNOSIS — D509 Iron deficiency anemia, unspecified: Secondary | ICD-10-CM

## 2024-08-01 DIAGNOSIS — E538 Deficiency of other specified B group vitamins: Secondary | ICD-10-CM

## 2024-08-01 DIAGNOSIS — R7303 Prediabetes: Secondary | ICD-10-CM

## 2024-08-01 DIAGNOSIS — I4891 Unspecified atrial fibrillation: Secondary | ICD-10-CM

## 2024-08-01 NOTE — Progress Notes (Signed)
 "  Established Patient Office Visit  Subjective   Patient ID: Erik [name], male    DOB: 10/24/1945  Age: 78 y.o. MRN: [id number]  Chief Complaint  Patient presents with   Medical Management of Chronic Issues    6 month follow up     HPI Discussed the use of AI scribe software for clinical note transcription with the patient, who gave verbal consent to proceed.  History of Present Illness    Erik [name] is a 78 year old male who presents for a routine follow-up visit.  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 - Headaches and stomach issues during period of alcohol use, resolved after cessation - No desire to consume alcohol, including avoidance of medications containing alcohol  Medication management - Takes medications with a beverage every morning - No need for medication refills at this time - Uses [facility] for prescriptions - Has more vitamins than other medications     Patient Active Problem List   Diagnosis Date Noted   Chronic kidney disease, stage 3a (HCC) 01/05/2024   Neurocognitive deficits 02/01/2023   Hyperlipidemia LDL goal <100 01/22/2023   BPH with obstruction/lower urinary tract symptoms 01/22/2023   Bilateral lower extremity edema 01/22/2023   Protein-calorie malnutrition, severe 01/22/2023   Elevated troponin 01/20/2023   Paroxysmal atrial fibrillation (HCC) 01/19/2023   Vitamin D  deficiency 10/28/2022   Cognitive impairment 10/28/2022   PAD (peripheral artery disease) 10/28/2022   Encounter for general adult medical examination with abnormal findings 10/28/2022   Type 2 diabetes mellitus (HCC) 10/28/2022   Iron deficiency anemia 06/04/2016   Microcytic anemia 06/25/2015   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  Constitutional:      Appearance: Normal appearance.  HENT:     Head: Normocephalic.     Right Ear: Tympanic membrane, ear canal and external ear normal.     Left Ear: Tympanic membrane, ear canal and external ear normal.     Nose: Nose normal.     Mouth/Throat:     Mouth: Mucous membranes are moist.     Pharynx: Oropharynx is clear.  Eyes:     Extraocular Movements: Extraocular movements intact.     Conjunctiva/sclera: Conjunctivae normal.     Pupils: Pupils are equal, round, and reactive to light.  Cardiovascular:     Rate and Rhythm: Normal rate and regular rhythm.  Pulmonary:     Effort: Pulmonary effort is normal.     Breath sounds: Normal breath sounds.  Abdominal:     General: Abdomen is flat. Bowel sounds are normal.  Palpations: Abdomen is soft.  Musculoskeletal:        General: Normal range of motion.     Cervical back: Normal range of motion and neck supple.  Skin:    General: Skin is warm and dry.  Neurological:     Mental Status: He is alert and oriented to person, place, and time.  Psychiatric:        Mood and Affect: Mood normal.        Thought Content: Thought content normal.      No results found for any visits on 08/01/24.    The ASCVD Risk score (Arnett DK, et al., 2019) failed to calculate for the following reasons:   The valid total cholesterol range is 130 to 320 mg/dL    Assessment & Plan:   Problem  List Items Addressed This Visit       Endocrine   Type 2 diabetes mellitus (HCC)   Not on medication currently and has focused on dietary control.   Lab Results  Component Value Date   HGBA1C 6.4 (H) 01/05/2024   HGBA1C 6.5 (H) 07/02/2023   HGBA1C 6.2 (H) 11/06/2022           Other   Hyperlipidemia LDL goal <100 - Primary   Currently treated with atorvastatin  40 mg daily.  No medication changes are indicated today.      Return in about 6 months (around 01/30/2025) for chronic follow-up with PCP.    Leita Longs, FNP  "

## 2024-08-07 NOTE — Assessment & Plan Note (Signed)
 Currently treated with atorvastatin  40 mg daily.  No medication changes are indicated today.

## 2024-08-07 NOTE — Assessment & Plan Note (Signed)
 Not on medication currently and has focused on dietary control.   Lab Results  Component Value Date   HGBA1C 6.4 (H) 01/05/2024   HGBA1C 6.5 (H) 07/02/2023   HGBA1C 6.2 (H) 11/06/2022

## 2024-08-16 ENCOUNTER — Other Ambulatory Visit: Payer: Self-pay

## 2025-01-30 ENCOUNTER — Ambulatory Visit: Payer: Self-pay | Admitting: Internal Medicine

## 2025-03-19 ENCOUNTER — Encounter (INDEPENDENT_AMBULATORY_CARE_PROVIDER_SITE_OTHER)

## 2025-03-19 ENCOUNTER — Ambulatory Visit (INDEPENDENT_AMBULATORY_CARE_PROVIDER_SITE_OTHER): Admitting: Vascular Surgery
